# Patient Record
Sex: Female | Born: 1965 | Race: White | Hispanic: No | Marital: Married | Smoking: Never smoker
Health system: Southern US, Community
[De-identification: ages and names within clinical notes are randomized; demographics above are authoritative.]

## PROBLEM LIST (undated history)

## (undated) DIAGNOSIS — H8109 Meniere's disease, unspecified ear: Secondary | ICD-10-CM

## (undated) DIAGNOSIS — E785 Hyperlipidemia, unspecified: Secondary | ICD-10-CM

## (undated) DIAGNOSIS — G43909 Migraine, unspecified, not intractable, without status migrainosus: Secondary | ICD-10-CM

## (undated) DIAGNOSIS — N6019 Diffuse cystic mastopathy of unspecified breast: Secondary | ICD-10-CM

## (undated) DIAGNOSIS — N189 Chronic kidney disease, unspecified: Secondary | ICD-10-CM

## (undated) DIAGNOSIS — K219 Gastro-esophageal reflux disease without esophagitis: Secondary | ICD-10-CM

## (undated) DIAGNOSIS — T7840XA Allergy, unspecified, initial encounter: Secondary | ICD-10-CM

## (undated) DIAGNOSIS — G709 Myoneural disorder, unspecified: Secondary | ICD-10-CM

## (undated) DIAGNOSIS — I10 Essential (primary) hypertension: Secondary | ICD-10-CM

## (undated) DIAGNOSIS — M1990 Unspecified osteoarthritis, unspecified site: Secondary | ICD-10-CM

## (undated) HISTORY — DX: Chronic kidney disease, unspecified: N18.9

## (undated) HISTORY — DX: Diffuse cystic mastopathy of unspecified breast: N60.19

## (undated) HISTORY — DX: Unspecified osteoarthritis, unspecified site: M19.90

## (undated) HISTORY — DX: Meniere's disease, unspecified ear: H81.09

## (undated) HISTORY — PX: SPINE SURGERY: SHX786

## (undated) HISTORY — DX: Hyperlipidemia, unspecified: E78.5

## (undated) HISTORY — DX: Allergy, unspecified, initial encounter: T78.40XA

## (undated) HISTORY — DX: Essential (primary) hypertension: I10

## (undated) HISTORY — PX: TUBAL LIGATION: SHX77

## (undated) HISTORY — DX: Gastro-esophageal reflux disease without esophagitis: K21.9

## (undated) HISTORY — DX: Migraine, unspecified, not intractable, without status migrainosus: G43.909

## (undated) HISTORY — DX: Myoneural disorder, unspecified: G70.9

---
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
## 1997-01-27 HISTORY — PX: APPENDECTOMY: SHX54

## 1999-12-17 ENCOUNTER — Encounter: Payer: Self-pay | Admitting: *Deleted

## 1999-12-17 ENCOUNTER — Ambulatory Visit (HOSPITAL_COMMUNITY): Admission: RE | Admit: 1999-12-17 | Discharge: 1999-12-17 | Payer: Self-pay | Admitting: *Deleted

## 2000-01-14 ENCOUNTER — Encounter (HOSPITAL_COMMUNITY): Admission: RE | Admit: 2000-01-14 | Discharge: 2000-02-28 | Payer: Self-pay | Admitting: *Deleted

## 2000-03-10 ENCOUNTER — Ambulatory Visit (HOSPITAL_COMMUNITY): Admission: RE | Admit: 2000-03-10 | Discharge: 2000-03-10 | Payer: Self-pay | Admitting: *Deleted

## 2000-03-10 ENCOUNTER — Encounter: Payer: Self-pay | Admitting: *Deleted

## 2000-03-17 ENCOUNTER — Encounter (HOSPITAL_COMMUNITY): Admission: RE | Admit: 2000-03-17 | Discharge: 2000-05-27 | Payer: Self-pay | Admitting: *Deleted

## 2000-05-19 ENCOUNTER — Encounter: Payer: Self-pay | Admitting: *Deleted

## 2000-06-02 ENCOUNTER — Encounter (HOSPITAL_COMMUNITY): Admission: RE | Admit: 2000-06-02 | Discharge: 2000-07-02 | Payer: Self-pay | Admitting: *Deleted

## 2000-06-30 ENCOUNTER — Encounter: Payer: Self-pay | Admitting: *Deleted

## 2000-07-04 ENCOUNTER — Inpatient Hospital Stay (HOSPITAL_COMMUNITY): Admission: AD | Admit: 2000-07-04 | Discharge: 2000-07-04 | Payer: Self-pay | Admitting: Obstetrics

## 2000-07-06 ENCOUNTER — Inpatient Hospital Stay (HOSPITAL_COMMUNITY): Admission: AD | Admit: 2000-07-06 | Discharge: 2000-07-10 | Payer: Self-pay | Admitting: Obstetrics & Gynecology

## 2000-07-11 ENCOUNTER — Encounter: Admission: RE | Admit: 2000-07-11 | Discharge: 2000-08-10 | Payer: Self-pay | Admitting: *Deleted

## 2000-07-12 ENCOUNTER — Inpatient Hospital Stay (HOSPITAL_COMMUNITY): Admission: AD | Admit: 2000-07-12 | Discharge: 2000-07-12 | Payer: Self-pay | Admitting: Obstetrics

## 2000-09-10 ENCOUNTER — Encounter: Admission: RE | Admit: 2000-09-10 | Discharge: 2000-10-10 | Payer: Self-pay | Admitting: *Deleted

## 2000-10-11 ENCOUNTER — Encounter: Admission: RE | Admit: 2000-10-11 | Discharge: 2000-11-10 | Payer: Self-pay | Admitting: *Deleted

## 2000-12-11 ENCOUNTER — Encounter: Admission: RE | Admit: 2000-12-11 | Discharge: 2001-01-10 | Payer: Self-pay | Admitting: *Deleted

## 2001-02-10 ENCOUNTER — Encounter: Admission: RE | Admit: 2001-02-10 | Discharge: 2001-03-12 | Payer: Self-pay | Admitting: *Deleted

## 2001-03-13 ENCOUNTER — Encounter: Admission: RE | Admit: 2001-03-13 | Discharge: 2001-04-12 | Payer: Self-pay | Admitting: *Deleted

## 2001-05-11 ENCOUNTER — Encounter: Admission: RE | Admit: 2001-05-11 | Discharge: 2001-06-10 | Payer: Self-pay | Admitting: *Deleted

## 2001-07-11 ENCOUNTER — Encounter: Admission: RE | Admit: 2001-07-11 | Discharge: 2001-08-10 | Payer: Self-pay | Admitting: *Deleted

## 2001-09-10 ENCOUNTER — Encounter: Admission: RE | Admit: 2001-09-10 | Discharge: 2001-10-10 | Payer: Self-pay | Admitting: *Deleted

## 2001-10-11 ENCOUNTER — Encounter: Admission: RE | Admit: 2001-10-11 | Discharge: 2001-11-10 | Payer: Self-pay | Admitting: *Deleted

## 2001-12-11 ENCOUNTER — Encounter: Admission: RE | Admit: 2001-12-11 | Discharge: 2002-01-10 | Payer: Self-pay | Admitting: *Deleted

## 2003-11-17 ENCOUNTER — Ambulatory Visit: Payer: Self-pay | Admitting: Emergency Medicine

## 2003-11-19 ENCOUNTER — Ambulatory Visit: Payer: Self-pay | Admitting: Emergency Medicine

## 2003-11-21 ENCOUNTER — Ambulatory Visit: Payer: Self-pay | Admitting: Emergency Medicine

## 2003-11-22 ENCOUNTER — Ambulatory Visit: Payer: Self-pay | Admitting: Emergency Medicine

## 2004-07-04 ENCOUNTER — Ambulatory Visit: Payer: Self-pay | Admitting: Surgery

## 2004-07-09 ENCOUNTER — Ambulatory Visit: Payer: Self-pay | Admitting: Surgery

## 2004-09-16 ENCOUNTER — Ambulatory Visit: Payer: Self-pay | Admitting: Obstetrics and Gynecology

## 2004-11-12 ENCOUNTER — Ambulatory Visit: Payer: Self-pay | Admitting: Obstetrics and Gynecology

## 2005-04-28 ENCOUNTER — Ambulatory Visit: Payer: Self-pay | Admitting: General Practice

## 2005-11-18 ENCOUNTER — Ambulatory Visit: Payer: Self-pay | Admitting: Obstetrics and Gynecology

## 2006-03-06 ENCOUNTER — Ambulatory Visit: Payer: Self-pay | Admitting: Surgery

## 2006-06-23 ENCOUNTER — Ambulatory Visit: Payer: Self-pay | Admitting: Endocrinology

## 2006-10-24 ENCOUNTER — Emergency Department: Payer: Self-pay | Admitting: Emergency Medicine

## 2007-05-05 ENCOUNTER — Ambulatory Visit: Payer: Self-pay

## 2008-02-22 ENCOUNTER — Ambulatory Visit: Payer: Self-pay

## 2008-02-23 ENCOUNTER — Ambulatory Visit: Payer: Self-pay | Admitting: General Practice

## 2008-08-31 ENCOUNTER — Ambulatory Visit: Payer: Self-pay | Admitting: Obstetrics and Gynecology

## 2008-12-18 ENCOUNTER — Ambulatory Visit: Payer: Self-pay | Admitting: Unknown Physician Specialty

## 2009-01-27 HISTORY — PX: ABDOMINAL HYSTERECTOMY: SHX81

## 2009-01-27 HISTORY — PX: CHOLECYSTECTOMY: SHX55

## 2009-09-11 ENCOUNTER — Other Ambulatory Visit: Payer: Self-pay | Admitting: Obstetrics and Gynecology

## 2009-09-11 ENCOUNTER — Ambulatory Visit: Payer: Self-pay | Admitting: Obstetrics and Gynecology

## 2009-09-12 ENCOUNTER — Ambulatory Visit: Payer: Self-pay | Admitting: Surgery

## 2009-09-13 ENCOUNTER — Ambulatory Visit: Payer: Self-pay | Admitting: Surgery

## 2009-09-13 LAB — CA 125: CA 125: 10.1 U/mL (ref 0.0–34.0)

## 2009-09-17 LAB — PATHOLOGY REPORT

## 2009-09-27 ENCOUNTER — Ambulatory Visit: Payer: Self-pay | Admitting: Obstetrics and Gynecology

## 2010-02-10 ENCOUNTER — Ambulatory Visit: Payer: Self-pay | Admitting: Internal Medicine

## 2010-06-20 ENCOUNTER — Ambulatory Visit: Payer: Self-pay | Admitting: Urology

## 2010-08-23 ENCOUNTER — Other Ambulatory Visit: Payer: Self-pay | Admitting: Obstetrics and Gynecology

## 2010-09-18 ENCOUNTER — Ambulatory Visit: Payer: Self-pay | Admitting: Obstetrics and Gynecology

## 2010-10-25 ENCOUNTER — Ambulatory Visit: Payer: Self-pay | Admitting: Internal Medicine

## 2010-11-12 ENCOUNTER — Other Ambulatory Visit: Payer: Self-pay | Admitting: Physician Assistant

## 2010-11-28 HISTORY — PX: BREAST BIOPSY: SHX20

## 2010-12-13 ENCOUNTER — Ambulatory Visit: Payer: Self-pay | Admitting: Anesthesiology

## 2010-12-16 ENCOUNTER — Ambulatory Visit: Payer: Self-pay | Admitting: Internal Medicine

## 2010-12-23 ENCOUNTER — Ambulatory Visit: Payer: Self-pay | Admitting: Surgery

## 2010-12-25 LAB — PATHOLOGY REPORT

## 2011-01-28 DIAGNOSIS — N6019 Diffuse cystic mastopathy of unspecified breast: Secondary | ICD-10-CM

## 2011-01-28 HISTORY — DX: Diffuse cystic mastopathy of unspecified breast: N60.19

## 2011-04-04 ENCOUNTER — Ambulatory Visit: Payer: Self-pay | Admitting: Internal Medicine

## 2011-11-04 LAB — HM PAP SMEAR: HM Pap smear: NORMAL

## 2011-12-05 LAB — HM MAMMOGRAPHY: HM Mammogram: NORMAL

## 2011-12-22 ENCOUNTER — Ambulatory Visit: Payer: Self-pay | Admitting: Obstetrics and Gynecology

## 2012-02-04 ENCOUNTER — Ambulatory Visit (INDEPENDENT_AMBULATORY_CARE_PROVIDER_SITE_OTHER): Payer: 59 | Admitting: Internal Medicine

## 2012-02-04 ENCOUNTER — Encounter: Payer: Self-pay | Admitting: Internal Medicine

## 2012-02-04 VITALS — BP 126/78 | HR 78 | Temp 98.3°F | Resp 16 | Ht 67.5 in | Wt 239.8 lb

## 2012-02-04 DIAGNOSIS — H8109 Meniere's disease, unspecified ear: Secondary | ICD-10-CM

## 2012-02-04 DIAGNOSIS — E669 Obesity, unspecified: Secondary | ICD-10-CM

## 2012-02-04 MED ORDER — PHENTERMINE HCL 37.5 MG PO TABS: 18.0000 mg | ORAL_TABLET | Freq: Every day | ORAL | Status: DC

## 2012-02-04 NOTE — Progress Notes (Signed)
Patient ID: Amanda Humphrey, female   DOB: [birth year], 47 y.o.   MRN: [id number]  Patient Active Problem List  Diagnosis  . Meniere disease  . Obesity (BMI 30-39.9)    Subjective:  CC:   Chief Complaint  Patient presents with  . Establish Care    HPI:   Amanda Humphrey is a 47 y.o. female who presents as a new patient to establish primary care with the chief complaint of  obesity with expressed desire to lose weight. She has had prior trials of phentermine which was well tolerated  would like to resume pharmacotherapy along with a diet and exercise. She has a history of right bundle branch block but did not have any arrhythmias while taking phentermine in the past. She is planning to join a gym as that is halfway between work and home and has a genuine interest in following the low glycemic index diet. 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 Medical history includes Mnire's disease which is often triggered by sinus infections. She has a history of Bell's palsy involving the left side her face which occurred 3 years ago. She has a history of a supracervical hysterectomy in 2011 secondary to adhesions. She's history of a laparoscopic cholecystectomy due to biliary dyskinesia. Currently she feels fine.    Past Medical History  Diagnosis Date  . Allergy   . Cardiac arrhythmia due to congenital heart disease   . Chronic kidney disease     stones  . Migraines   . Clotting disorder   . Meniere disease     Past Surgical History  Procedure Date  . Cholecystectomy 2011  . Appendectomy 1999  . Tonsillectomy and adenoidectomy 1975  . Cesarean section     4 total  . Breast biopsy Nov 2012    right,  fibrocystic disease, Michela Pitcher  . Abdominal hysterectomy 2011    laprascopic supracervical, DeFrancesco    Family History  Problem Relation Age of Onset  . Hypertension Mother   . Cancer Mother     ovarian  . Arthritis Mother   .  Hyperlipidemia Father   . Hypertension Father   . Diabetes Father   . Alzheimer's disease Maternal Grandmother   . Heart disease Maternal Grandmother   . Alzheimer's disease Paternal Grandmother     History   Social History  . Marital Status: Married    Spouse Name: N/A    Number of Children: N/A  . Years of Education: N/A   Occupational History  . Not on file.   Social History Main Topics  . Smoking status: Never Smoker   . Smokeless tobacco: Not on file  . Alcohol Use: Yes  . Drug Use: No  . Sexually Active:    Other Topics Concern  . Not on file   Social History Narrative  . No narrative on file   Allergies  Allergen Reactions  . Codeine     Rash, Throat swelling,   . Hydrocodone Anaphylaxis  . Tramadol     Itching, facial swelling  . Oxycodone Rash    Review of Systems:   The remainder of the review of systems was negative except those addressed in the HPI.       Objective:  BP 126/78  Pulse 78  Temp 98.3 F (36.8 C) (Oral)  Resp 16  Ht 5' 7.5" (1.715 m)  Wt 239 lb 12 oz (108.75 kg)  BMI 37.00 kg/m2  SpO2  97%  General appearance: alert, cooperative and appears stated age Ears: normal TM's and external ear canals both ears Throat: lips, mucosa, and tongue normal; teeth and gums normal Neck: no adenopathy, no carotid bruit, supple, symmetrical, trachea midline and thyroid not enlarged, symmetric, no tenderness/mass/nodules Back: symmetric, no curvature. ROM normal. No CVA tenderness. Lungs: clear to auscultation bilaterally Heart: regular rate and rhythm, S1, S2 normal, no murmur, click, rub or gallop Abdomen: soft, non-tender; bowel sounds normal; no masses,  no organomegaly Pulses: 2+ and symmetric Skin: Skin color, texture, turgor normal. No rashes or lesions Lymph nodes: Cervical, supraclavicular, and axillary nodes normal.  Assessment and Plan:  Obesity (BMI 30-39.9) Patient is very motivated to lose weight. She is requesting  pharmacotherapy and understands that if her weight plateaus the medication will be suspended. She has tolerated phentermine in the past advised resuming this rather than trying a new medication which I am not familiar with. She has been screened for diabetes hypothyroidism. Printed handout for low glycemic index diet given to patient. She has a gym membership planned and advised that she will need to make her goal 30 minutes of exercise 5 days a week.  Meniere disease Currently controlled on Dyazide. Episodes of vertigo are triggered by sinusitis. Recommended daily sinus hygiene during the winter season with sterile saline flushes.    Updated Medication List Outpatient Encounter Prescriptions as of 02/04/2012  Medication Sig Dispense Refill  . triamterene-hydrochlorothiazide (DYAZIDE) 37.5-25 MG per capsule Take 1 capsule by mouth every morning.      . phentermine (ADIPEX-P) 37.5 MG tablet Take 0.5 tablets (18.75 mg total) by mouth daily before breakfast.  30 tablet  2     Orders Placed This Encounter  Procedures  . HM MAMMOGRAPHY  . HM PAP SMEAR    No Follow-up on file.

## 2012-02-04 NOTE — Patient Instructions (Addendum)
This is  my version of a  "Low GI"  Diet:  All of the foods can be found at grocery stores and in bulk at [employer].  The Atkins protein bars and shakes are available in more varieties at [employer], [employer] and [employer].     7 AM Breakfast:  Low carbohydrate Protein  Shakes (I recommend the EAS AdvantEdge "Carb Control" shakes  Or the low carb shakes by Atkins.   Both are available everywhere:  In  cases at [employer]Js  Or in 4 packs at grocery stores and pharmacies  2.5 carbs  (Alternative is  a toasted Arnold's Sandwhich Thin w/ peanut butter, a "Bagel Thin" with cream cheese and salmon) or  a scrambled egg burrito made with a low carb tortilla .  Avoid cereal and bananas, oatmeal too unless you are cooking the old fashioned kind that takes 30-40 minutes to prepare.  the rest is overly processed, has minimal fiber, and is loaded with carbohydrates!   10 AM: Protein bar by Atkins (the snack size, under 200 cal).  There are many varieties , available widely again or in bulk in limited varieties at [employer]Js)  Other so called "protein bars" tend to be loaded with carbohydrates.  Remember, in food advertising, the word "energy" is synonymous for " carbohydrate."  Lunch: sandwich of [place], (or any lunchmeat, grilled meat or canned tuna), fresh avocado, mayonnaise  and cheese on a lower carbohydrate pita bread, flatbread, or tortilla . Ok to use regular mayonnaise. The bread is the only source or carbohydrate that can be decreased (Joseph's makes a pita bread and a flat bread that are 50 cal and 4 net carbs ; Toufayan makes a low carb flatbread that's 100 cal and 9 net carbs  and  Mission makes a low carb whole wheat tortilla  That is 210 cal and 6 net carbs)  3 PM:  Mid day :  Another protein bar,  Or a  cheese stick (100 cal, 0 carbs),  Or 1 ounce of  almonds, walnuts, pistachios, pecans, peanuts,  Macadamia nuts. Or a Dannon light n Fit greek yogurt, 80 cal 8 net carbs . Avoid "granola"; the dried cranberries and  raisins are loaded with carbohydrates. Mixed nuts ok if no raisins or cranberries or dried fruit.      6 PM  Dinner:  "mean and green:"  Meat/chicken/fish or a high protein legume; , with a green salad, and a low GI  Veggie (broccoli, cauliflower, green beans, spinach, brussel sprouts. Lima beans) : Avoid "Low fat dressings, as well as Reyne Dumas and [address]! They are loaded with sugar! Instead use ranch, vinaigrette,  Blue cheese, etc.  There is a low carb pasta by Dreamfield's available at [facility] that is acceptable and tastes great. Try Michel Angelo's chicken piccata over low carb pasta. The chicken dish is 0 carbs, and can be found in frozen section at BJs and [employer]. Also try [employer] "Carnitas" (pulled pork, no sauce,  0 carbs) and his pot roast.   both are in the refrigerated section at BJs   9 PM snack : Breyer's "low carb" fudgsicle or  ice cream bar (Carb Smart line), or  Weight Watcher's ice cream bar , or another "no sugar added" ice cream;a serving of fresh berries/cherries with whipped cream (Avoid bananas, pineapple, grapes  and watermelon on a regular basis because they are high in sugar)   Remember that snack Substitutions should be less than 15 to  20 carbs  Per serving. Remember to subtract fiber grams and sugar alcohols to get the "net carbs."

## 2012-02-05 ENCOUNTER — Encounter: Payer: Self-pay | Admitting: Internal Medicine

## 2012-02-05 DIAGNOSIS — E669 Obesity, unspecified: Secondary | ICD-10-CM | POA: Insufficient documentation

## 2012-02-05 NOTE — Assessment & Plan Note (Addendum)
Patient is very motivated to lose weight. She is requesting pharmacotherapy and understands that if her weight plateaus the medication will be suspended. She has tolerated phentermine in the past advised resuming this rather than trying a new medication which I am not familiar with. She has been screened for diabetes hypothyroidism. Printed handout for low glycemic index diet given to patient. She has a gym membership planned and advised that she will need to make her goal 30 minutes of exercise 5 days a week.

## 2012-02-05 NOTE — Assessment & Plan Note (Signed)
Currently controlled on Dyazide. Episodes of vertigo are triggered by sinusitis. Recommended daily sinus hygiene during the winter season with sterile saline flushes.

## 2012-03-09 ENCOUNTER — Ambulatory Visit: Payer: Self-pay

## 2012-03-25 ENCOUNTER — Encounter: Payer: Self-pay | Admitting: General Practice

## 2012-03-27 ENCOUNTER — Encounter: Payer: Self-pay | Admitting: General Practice

## 2012-03-31 ENCOUNTER — Other Ambulatory Visit: Payer: Self-pay | Admitting: Internal Medicine

## 2012-03-31 MED ORDER — PHENTERMINE HCL 37.5 MG PO TABS: 18.0000 mg | ORAL_TABLET | Freq: Every day | ORAL | Status: DC

## 2012-03-31 NOTE — Progress Notes (Signed)
Pt notified Phentermine available at the front desk for pick up.

## 2012-04-02 ENCOUNTER — Ambulatory Visit: Payer: Self-pay | Admitting: General Practice

## 2012-06-30 ENCOUNTER — Other Ambulatory Visit: Payer: Self-pay | Admitting: Internal Medicine

## 2012-06-30 MED ORDER — PHENTERMINE HCL 37.5 MG PO TABS: 18.0000 mg | ORAL_TABLET | Freq: Every day | ORAL | Status: DC

## 2012-09-03 ENCOUNTER — Encounter: Payer: Self-pay | Admitting: Internal Medicine

## 2012-09-03 ENCOUNTER — Ambulatory Visit (INDEPENDENT_AMBULATORY_CARE_PROVIDER_SITE_OTHER): Payer: 59 | Admitting: Internal Medicine

## 2012-09-03 VITALS — BP 128/82 | HR 65 | Temp 98.6°F | Resp 14 | Wt 226.8 lb

## 2012-09-03 DIAGNOSIS — E669 Obesity, unspecified: Secondary | ICD-10-CM

## 2012-09-03 DIAGNOSIS — N6019 Diffuse cystic mastopathy of unspecified breast: Secondary | ICD-10-CM

## 2012-09-03 DIAGNOSIS — N6011 Diffuse cystic mastopathy of right breast: Secondary | ICD-10-CM

## 2012-09-03 MED ORDER — PHENTERMINE HCL 37.5 MG PO TABS: 37.5000 mg | ORAL_TABLET | Freq: Every day | ORAL | Status: DC

## 2012-09-03 NOTE — Progress Notes (Signed)
Patient ID: Amanda Humphrey, female   DOB: 24-Dec-1965, 47 y.o.   MRN: [id number]   Patient Active Problem List   Diagnosis Date Noted  . Fibrocystic breast disease   . Obesity (BMI 30-39.9) 02/05/2012  . Meniere disease     Subjective:  CC:   Chief Complaint  Patient presents with  . Follow-up    weight check     HPI:   Amanda Humphrey a 47 y.o. female who presents  Three-month followup on obesity with medication prescribed for appetite suppression. She Has lost 19 lbs by measurements on same scale.   She is Tolerating phentermine well.   She is taking it once daily full-strength. She has not been exercising recently due to deteriorating health of her parents live nearby. He has struggled with her weight her whole life and craves salt and carbohydrates. Increasing familial obligations and increased work stressors are cited as obstacles to developing consistency with  regular exercise.    2) she has noted a new lump on her right breast directly in the area of her  lumpectomy scar,  is actually inferior to it. It is nontender. Her lumpectomy was done in 2013 by Dr. Michela Humphrey  Past Medical History  Diagnosis Date  . Allergy   . Cardiac arrhythmia due to congenital heart disease   . Chronic kidney disease     stones  . Migraines   . Clotting disorder   . Meniere disease   . Fibrocystic breast disease 2013    Past Surgical History  Procedure Laterality Date  . Cholecystectomy  2011  . Appendectomy  1999  . Tonsillectomy and adenoidectomy  1975  . Cesarean section      4 total  . Breast biopsy  Nov 2012    right,  fibrocystic disease, Amanda Humphrey  . Abdominal hysterectomy  2011    laprascopic supracervical, Amanda Humphrey       The following portions of the patient's history were reviewed and updated as appropriate: Allergies, current medications, and problem list.    Review of Systems:   12 Pt  review of systems was negative except those addressed in the HPI,     History    Social History  . Marital Status: Married    Spouse Name: N/A    Number of Children: N/A  . Years of Education: N/A   Occupational History  . Not on file.   Social History Main Topics  . Smoking status: Never Smoker   . Smokeless tobacco: Not on file  . Alcohol Use: Yes  . Drug Use: No  . Sexually Active: Not on file   Other Topics Concern  . Not on file   Social History Narrative  . No narrative on file    Objective:  Filed Vitals:   09/03/12 0910  BP: 128/82  Pulse: 65  Temp: 98.6 F (37 C)  Resp: 14     .BP 128/82  Pulse 65  Temp(Src) 98.6 F (37 C) (Oral)  Resp 14  Wt 226 lb 12 oz (102.853 kg)  BMI 34.97 kg/m2  SpO2 99%  General Appearance:    Alert, cooperative, no distress, appears stated age  Head:    Normocephalic, without obvious abnormality, atraumatic  Eyes:    PERRL, conjunctiva/corneas clear, EOM's intact, fundi    benign, both eyes  Ears:    Normal TM's and external ear canals, both ears  Nose:   Nares normal, septum midline, mucosa normal, no drainage    or  sinus tenderness  Throat:   Lips, mucosa, and tongue normal; teeth and gums normal  Neck:   Supple, symmetrical, trachea midline, no adenopathy;    thyroid:  no enlargement/tenderness/nodules; no carotid   bruit or JVD  Back:     Symmetric, no curvature, ROM normal, no CVA tenderness  Lungs:     Clear to auscultation bilaterally, respirations unlabored  Chest Wall:    No tenderness or deformity   Heart:    Regular rate and rhythm, S1 and S2 normal, no murmur, rub   or gallop  Breast Exam:     Pea sized nodule at the 3:00 position inferior to lumpectomy scar   Abdomen:     Soft, non-tender, bowel sounds active all four quadrants,    no masses, no organomegaly  Extremities:   Extremities normal, atraumatic, no cyanosis or edema  Pulses:   2+ and symmetric all extremities  Skin:   Skin color, texture, turgor normal, no rashes or lesions  Lymph nodes:   Cervical, supraclavicular, and  axillary nodes normal  Neurologic:   CNII-XII intact, normal strength, sensation and reflexes    throughout   Assessment and Plan:  Obesity (BMI 30-39.9) Improving with use of appetite suppression medication, phentermine. Encourage patient to begin regular exercise as part time for continuing phentermine is becoming limited. Will refill for another 3 months.  Fibrocystic breast disease She had a benign breast biopsy in 2012 and has now developed a lump  In the same breast inferior to her  lumpectomy scar. Diagnostic mammogram has been ordered.   Updated Medication List Outpatient Encounter Prescriptions as of 09/03/2012  Medication Sig Dispense Refill  . phentermine (ADIPEX-P) 37.5 MG tablet Take 1 tablet (37.5 mg total) by mouth daily before breakfast.  30 tablet  2  . triamterene-hydrochlorothiazide (DYAZIDE) 37.5-25 MG per capsule Take 1 capsule by mouth every morning.      . [DISCONTINUED] phentermine (ADIPEX-P) 37.5 MG tablet Take 0.5 tablets (18.75 mg total) by mouth daily before breakfast.  30 tablet  0   No facility-administered encounter medications on file as of 09/03/2012.     No orders of the defined types were placed in this encounter.    No Follow-up on file.

## 2012-09-04 ENCOUNTER — Encounter: Payer: Self-pay | Admitting: Internal Medicine

## 2012-09-04 DIAGNOSIS — N6019 Diffuse cystic mastopathy of unspecified breast: Secondary | ICD-10-CM | POA: Insufficient documentation

## 2012-09-04 NOTE — Assessment & Plan Note (Signed)
Improving with use of appetite suppression medication, phentermine. Encourage patient to begin regular exercise as part time for continuing phentermine is becoming limited. Will refill for another 3 months.

## 2012-09-04 NOTE — Assessment & Plan Note (Signed)
She had a benign breast biopsy in 2012 and has now developed a lump  In the same breast inferior to her  lumpectomy scar. Diagnostic mammogram has been ordered.

## 2012-10-19 ENCOUNTER — Ambulatory Visit: Payer: Self-pay

## 2012-10-25 ENCOUNTER — Ambulatory Visit (INDEPENDENT_AMBULATORY_CARE_PROVIDER_SITE_OTHER): Payer: 59 | Admitting: Internal Medicine

## 2012-10-25 ENCOUNTER — Encounter: Payer: Self-pay | Admitting: Internal Medicine

## 2012-10-25 VITALS — BP 136/82 | HR 78 | Temp 98.9°F | Resp 12 | Wt 232.0 lb

## 2012-10-25 DIAGNOSIS — T63431A Toxic effect of venom of caterpillars, accidental (unintentional), initial encounter: Secondary | ICD-10-CM | POA: Insufficient documentation

## 2012-10-25 DIAGNOSIS — T6591XS Toxic effect of unspecified substance, accidental (unintentional), sequela: Secondary | ICD-10-CM

## 2012-10-25 DIAGNOSIS — W57XXXA Bitten or stung by nonvenomous insect and other nonvenomous arthropods, initial encounter: Secondary | ICD-10-CM

## 2012-10-25 DIAGNOSIS — T148 Other injury of unspecified body region: Secondary | ICD-10-CM

## 2012-10-25 DIAGNOSIS — T63431S Toxic effect of venom of caterpillars, accidental (unintentional), sequela: Secondary | ICD-10-CM

## 2012-10-25 LAB — CBC WITH DIFFERENTIAL/PLATELET
Basophils Absolute: 0 10*3/uL (ref 0.0–0.1)
Basophils Relative: 0 % (ref 0.0–3.0)
Eosinophils Absolute: 0 10*3/uL (ref 0.0–0.7)
Eosinophils Relative: 0 % (ref 0.0–5.0)
HCT: 39.5 % (ref 36.0–46.0)
Hemoglobin: 13.4 g/dL (ref 12.0–15.0)
Lymphocytes Relative: 9.2 % — ABNORMAL LOW (ref 12.0–46.0)
Lymphs Abs: 1 10*3/uL (ref 0.7–4.0)
MCHC: 33.8 g/dL (ref 30.0–36.0)
MCV: 88.4 fl (ref 78.0–100.0)
Monocytes Absolute: 0.2 10*3/uL (ref 0.1–1.0)
Monocytes Relative: 1.6 % — ABNORMAL LOW (ref 3.0–12.0)
Neutro Abs: 9.3 10*3/uL — ABNORMAL HIGH (ref 1.4–7.7)
Neutrophils Relative %: 89.2 % — ABNORMAL HIGH (ref 43.0–77.0)
Platelets: 280 10*3/uL (ref 150.0–400.0)
RBC: 4.47 Mil/uL (ref 3.87–5.11)
RDW: 13.8 % (ref 11.5–14.6)
WBC: 10.4 10*3/uL (ref 4.5–10.5)

## 2012-10-25 MED ORDER — LEVOFLOXACIN 500 MG PO TABS: 500.0000 mg | ORAL_TABLET | Freq: Every day | ORAL | Status: DC

## 2012-10-25 MED ORDER — PREDNISONE 10 MG PO TABS: ORAL_TABLET | ORAL | Status: DC

## 2012-10-25 MED ORDER — FAMOTIDINE 20 MG PO TABS: 20.0000 mg | ORAL_TABLET | Freq: Two times a day (BID) | ORAL | Status: DC

## 2012-10-25 MED ORDER — PREDNISONE 10 MG PO TABS: 30.0000 mg | ORAL_TABLET | Freq: Every day | ORAL | Status: DC

## 2012-10-25 NOTE — Progress Notes (Signed)
Patient ID: Amanda Humphrey, female   DOB: November 06, 1965, 47 y.o.   MRN: [id number]  Patient Active Problem List   Diagnosis Date Noted  . Toxic effect of venom of caterpillars, unintentional 10/25/2012  . Fibrocystic breast disease   . Obesity (BMI 30-39.9) 02/05/2012  . Meniere disease     Subjective:  CC:   No chief complaint on file.   HPI:   Amanda Humphrey a 47 y.o. female who presents with sequelaue of toxic caterpillar bite. The Caterpillar bite,  "puss" or flannel moth  Bite (confirmed by urgent Care as a venomous arthropod) [place] the beach.  Brushed her hand against one and felt immediate burning  irritation ,  Iced it immediately but her entire hand and forearm started swelling and turning burgundy immediately.  By the time she got to urgent Care th entire arm was llocke dup.  Received solumedrol., benadryl.   Given 60 mg prednisone taper  which she started Friday night, and toradol for pain .  Feels achey,  Face getting flushed ., feels febrile, goes from hot to cold. Some petechiae noted on fingers where she had contact.  Some nause,a  Has zofran at home,  Not using toradol because the arm pain has resolved.     Past Medical History  Diagnosis Date  . Allergy   . Cardiac arrhythmia due to congenital heart disease   . Chronic kidney disease     stones  . Migraines   . Clotting disorder   . Meniere disease   . Fibrocystic breast disease 2013    Past Surgical History  Procedure Laterality Date  . Cholecystectomy  2011  . Appendectomy  1999  . Tonsillectomy and adenoidectomy  1975  . Cesarean section      4 total  . Breast biopsy  Nov 2012    right,  fibrocystic disease, Michela Pitcher  . Abdominal hysterectomy  2011    laprascopic supracervical, DeFrancesco       The following portions of the patient's history were reviewed and updated as appropriate: Allergies, current medications, and problem list.    Review of Systems:   12 Pt  review of systems was  negative except those addressed in the HPI,     History   Social History  . Marital Status: Married    Spouse Name: N/A    Number of Children: N/A  . Years of Education: N/A   Occupational History  . Not on file.   Social History Main Topics  . Smoking status: Never Smoker   . Smokeless tobacco: Not on file  . Alcohol Use: Yes  . Drug Use: No  . Sexual Activity: Not on file   Other Topics Concern  . Not on file   Social History Narrative  . No narrative on file    Objective:  Filed Vitals:   10/25/12 1325  BP: 136/82  Pulse: 78  Temp: 98.9 F (37.2 C)  Resp: 12     General appearance: alert, cooperative and appears stated age Ears: normal TM's and external ear canals both ears Throat: lips, mucosa, and tongue normal; teeth and gums normal Neck: no adenopathy, no carotid bruit, supple, symmetrical, trachea midline and thyroid not enlarged, symmetric, no tenderness/mass/nodules Back: symmetric, no curvature. ROM normal. No CVA tenderness. Lungs: clear to auscultation bilaterally Heart: regular rate and rhythm, S1, S2 normal, no murmur, click, rub or gallop Abdomen: soft, non-tender; bowel sounds normal; no masses,  no organomegaly Pulses: 2+ and  symmetric Skin: Skin color, texture, turgor normal. No rashes or lesions Lymph nodes: Cervical, supraclavicular, and axillary nodes normal.  Assessment and Plan:  Toxic effect of venom of caterpillars, unintentional She is on day 4 of prednisone taper.  Will continue 30 mg daily of prednisone for up to one week, then resume taper. Adding famotidine 40 mg bid and zyrtec for antihistamine.  No muscle spasms or neurotoxic symptoms currently.  Checking CBC to rule out thrombocytopenia given petechiae noticed.    Updated Medication List Outpatient Encounter Prescriptions as of 10/25/2012  Medication Sig Dispense Refill  . ketorolac (TORADOL) 10 MG tablet Take 10 mg by mouth every 6 (six) hours as needed for pain.      .  phentermine (ADIPEX-P) 37.5 MG tablet Take 1 tablet (37.5 mg total) by mouth daily before breakfast.  30 tablet  2  . predniSONE (DELTASONE) 10 MG tablet Take 3 tablets (30 mg total) by mouth daily.  21 tablet  0  . triamterene-hydrochlorothiazide (DYAZIDE) 37.5-25 MG per capsule Take 1 capsule by mouth every morning.      . [DISCONTINUED] predniSONE (DELTASONE) 10 MG tablet Take 30 mg by mouth daily. Taper done 10 mg every day      . famotidine (PEPCID) 20 MG tablet Take 1 tablet (20 mg total) by mouth 2 (two) times daily.  14 tablet  0  . levofloxacin (LEVAQUIN) 500 MG tablet Take 1 tablet (500 mg total) by mouth daily.  7 tablet  0  . predniSONE (DELTASONE) 10 MG tablet 3 tablets daily for one week ,then taper using prior prescription  7 tablet  0   No facility-administered encounter medications on file as of 10/25/2012.     Orders Placed This Encounter  Procedures  . CBC with Differential    No Follow-up on file.

## 2012-10-25 NOTE — Patient Instructions (Addendum)
Please take 30 mg prednisone daily for the next few days until you are feeling better  (up to 7 days), then resume the taper  Adding famotidine 40 mg tiwce daily (anithistamine effedt)  Zyrtec daily instead of bendryl  Levaquin for your trip

## 2012-10-25 NOTE — Assessment & Plan Note (Signed)
She is on day 4 of prednisone taper.  Will continue 30 mg daily of prednisone for up to one week, then resume taper. Adding famotidine 40 mg bid and zyrtec for antihistamine.  No muscle spasms or neurotoxic symptoms currently.  Checking CBC to rule out thrombocytopenia given petechiae noticed.

## 2012-10-27 ENCOUNTER — Encounter: Payer: Self-pay | Admitting: *Deleted

## 2012-12-02 LAB — HM PAP SMEAR: HM Pap smear: NORMAL

## 2012-12-02 LAB — HM MAMMOGRAPHY: HM Mammogram: NORMAL

## 2012-12-06 ENCOUNTER — Ambulatory Visit: Payer: Self-pay | Admitting: Orthopedic Surgery

## 2012-12-08 ENCOUNTER — Other Ambulatory Visit: Payer: Self-pay | Admitting: Internal Medicine

## 2012-12-08 MED ORDER — PHENTERMINE HCL 37.5 MG PO TABS: 37.5000 mg | ORAL_TABLET | Freq: Every day | ORAL | Status: DC

## 2013-02-25 ENCOUNTER — Encounter: Payer: Self-pay | Admitting: Internal Medicine

## 2013-02-25 ENCOUNTER — Other Ambulatory Visit (INDEPENDENT_AMBULATORY_CARE_PROVIDER_SITE_OTHER): Payer: 59

## 2013-02-25 ENCOUNTER — Ambulatory Visit (INDEPENDENT_AMBULATORY_CARE_PROVIDER_SITE_OTHER): Payer: 59 | Admitting: Internal Medicine

## 2013-02-25 VITALS — BP 120/80 | HR 65 | Temp 98.5°F | Resp 20 | Ht 67.5 in | Wt 234.1 lb

## 2013-02-25 DIAGNOSIS — R5383 Other fatigue: Secondary | ICD-10-CM

## 2013-02-25 DIAGNOSIS — E669 Obesity, unspecified: Secondary | ICD-10-CM

## 2013-02-25 DIAGNOSIS — R5381 Other malaise: Secondary | ICD-10-CM

## 2013-02-25 DIAGNOSIS — E039 Hypothyroidism, unspecified: Secondary | ICD-10-CM

## 2013-02-25 DIAGNOSIS — E038 Other specified hypothyroidism: Secondary | ICD-10-CM

## 2013-02-25 DIAGNOSIS — H8109 Meniere's disease, unspecified ear: Secondary | ICD-10-CM

## 2013-02-25 DIAGNOSIS — E559 Vitamin D deficiency, unspecified: Secondary | ICD-10-CM

## 2013-02-25 LAB — CBC WITH DIFFERENTIAL/PLATELET
Basophils Absolute: 0 10*3/uL (ref 0.0–0.1)
Basophils Relative: 0.6 % (ref 0.0–3.0)
Eosinophils Absolute: 0.1 10*3/uL (ref 0.0–0.7)
Eosinophils Relative: 1.6 % (ref 0.0–5.0)
HCT: 44 % (ref 36.0–46.0)
Hemoglobin: 14.7 g/dL (ref 12.0–15.0)
Lymphocytes Relative: 34.9 % (ref 12.0–46.0)
Lymphs Abs: 2 10*3/uL (ref 0.7–4.0)
MCHC: 33.5 g/dL (ref 30.0–36.0)
MCV: 89.1 fl (ref 78.0–100.0)
Monocytes Absolute: 0.3 10*3/uL (ref 0.1–1.0)
Monocytes Relative: 4.5 % (ref 3.0–12.0)
Neutro Abs: 3.3 10*3/uL (ref 1.4–7.7)
Neutrophils Relative %: 58.4 % (ref 43.0–77.0)
Platelets: 269 10*3/uL (ref 150.0–400.0)
RBC: 4.93 Mil/uL (ref 3.87–5.11)
RDW: 13.5 % (ref 11.5–14.6)
WBC: 5.7 10*3/uL (ref 4.5–10.5)

## 2013-02-25 LAB — LIPID PANEL
Cholesterol: 202 mg/dL — ABNORMAL HIGH (ref 0–200)
HDL: 51.9 mg/dL (ref >39.00)
Total CHOL/HDL Ratio: 4
Triglycerides: 124 mg/dL (ref 0.0–149.0)
VLDL: 24.8 mg/dL (ref 0.0–40.0)

## 2013-02-25 LAB — LDL CHOLESTEROL, DIRECT: Direct LDL: 137.4 mg/dL

## 2013-02-25 MED ORDER — LORCASERIN HCL 10 MG PO TABS: 1.0000 | ORAL_TABLET | Freq: Two times a day (BID) | ORAL | Status: DC

## 2013-02-25 NOTE — Assessment & Plan Note (Addendum)
She is requesting a prescription for a 15 day trial of belviq .  We discussed the risks and benefits of this medication and the fact that if she does not lose 10% of her body weight over the next 3 months and the medication should be stopped. Given her current weight her goal is to lose 25 pounds. She has not had fasting lipids thyroid or screened for diabetes since last year. She will return for baseline blood work.

## 2013-02-25 NOTE — Patient Instructions (Signed)
Trial of Belviq for 15 days,  Twice daily dosing is the maximal daily dose  E mail me your weight in 2 weeks

## 2013-02-25 NOTE — Progress Notes (Signed)
Patient ID: Amanda Humphrey, female   DOB: August 04, 1965, 48 y.o.   MRN: [id number]  Patient Active Problem List   Diagnosis Date Noted  . Toxic effect of venom of caterpillars, unintentional 10/25/2012  . Fibrocystic breast disease   . Obesity (BMI 30-39.9) 02/05/2012  . Meniere disease     Subjective:  CC:   Chief Complaint  Patient presents with  . Follow-up    HPI:   Amanda Humphrey a 48 y.o. female who presents for follow up on obesity and Meniere's disease.  She is having trouble losing weight without pharmacologic assistance. She had limited success with prior trial of phentermine. She lost 19 pounds over 6 months but had elevations in blood pressure and heart rate and would like to try Belviq. She did some research and found an online coupon for 2 free weeks and is requesting a prescription. We reviewed her diet today and her previous obstacles to participating in regular exercise.   Past Medical History  Diagnosis Date  . Allergy   . Cardiac arrhythmia due to congenital heart disease   . Chronic kidney disease     stones  . Migraines   . Clotting disorder   . Meniere disease   . Fibrocystic breast disease 2013    Past Surgical History  Procedure Laterality Date  . Cholecystectomy  2011  . Appendectomy  1999  . Tonsillectomy and adenoidectomy  1975  . Cesarean section      4 total  . Breast biopsy  Nov 2012    right,  fibrocystic disease, Michela Pitcherly  . Abdominal hysterectomy  2011    laprascopic supracervical, [place]       The following portions of the patient's history were reviewed and updated as appropriate: Allergies, current medications, and problem list.    Review of Systems:   12 Pt  review of systems was negative except those addressed in the HPI,     History   Social History  . Marital Status: Married    Spouse Name: N/A    Number of Children: N/A  . Years of Education: N/A   Occupational History  . Not on file.   Social History Main  Topics  . Smoking status: Never Smoker   . Smokeless tobacco: Not on file  . Alcohol Use: Yes  . Drug Use: No  . Sexual Activity: Not on file   Other Topics Concern  . Not on file   Social History Narrative  . No narrative on file    Objective:  Filed Vitals:   02/25/13 0805  BP: 120/80  Pulse: 65  Temp: 98.5 F (36.9 C)  Resp: 20     General appearance: alert, cooperative and appears stated age Ears: normal TM's and external ear canals both ears Throat: lips, mucosa, and tongue normal; teeth and gums normal Neck: no adenopathy, no carotid bruit, supple, symmetrical, trachea midline and thyroid not enlarged, symmetric, no tenderness/mass/nodules Back: symmetric, no curvature. ROM normal. No CVA tenderness. Lungs: clear to auscultation bilaterally Heart: regular rate and rhythm, S1, S2 normal, no murmur, click, rub or gallop Abdomen: soft, non-tender; bowel sounds normal; no masses,  no organomegaly Pulses: 2+ and symmetric Skin: Skin color, texture, turgor normal. No rashes or lesions Lymph nodes: Cervical, supraclavicular, and axillary nodes normal.  Assessment and Plan:  Obesity (BMI 30-39.9) She is requesting a prescription for a 15 day trial of belviq .  We discussed the risks and benefits of this medication and the fact  that if she does not lose 10% of her body weight over the next 3 months and the medication should be stopped. Given her current weight her goal is to lose 25 pounds. She has not had fasting lipids thyroid or screened for diabetes since last year. She will return for baseline blood work.  Meniere disease  Managed with triamterene HCTZ. She has had no recent episodes.  she will return for fasting blood work which will include screening for lipoid deficiencies.Marland Kitchen   Updated Medication List Outpatient Encounter Prescriptions as of 02/25/2013  Medication Sig  . triamterene-hydrochlorothiazide (DYAZIDE) 37.5-25 MG per capsule Take 1 capsule by mouth every  morning.  . Lorcaserin HCl (BELVIQ) 10 MG TABS Take 1 tablet by mouth 2 (two) times daily.  . Lorcaserin HCl (BELVIQ) 10 MG TABS Take 1 tablet by mouth 2 (two) times daily.  . [DISCONTINUED] famotidine (PEPCID) 20 MG tablet Take 1 tablet (20 mg total) by mouth 2 (two) times daily.  . [DISCONTINUED] ketorolac (TORADOL) 10 MG tablet Take 10 mg by mouth every 6 (six) hours as needed for pain.  . [DISCONTINUED] levofloxacin (LEVAQUIN) 500 MG tablet Take 1 tablet (500 mg total) by mouth daily.  . [DISCONTINUED] Lorcaserin HCl (BELVIQ) 10 MG TABS Take 1 tablet by mouth 2 (two) times daily.  . [DISCONTINUED] phentermine (ADIPEX-P) 37.5 MG tablet Take 1 tablet (37.5 mg total) by mouth daily before breakfast.  . [DISCONTINUED] predniSONE (DELTASONE) 10 MG tablet 3 tablets daily for one week ,then taper using prior prescription  . [DISCONTINUED] predniSONE (DELTASONE) 10 MG tablet Take 3 tablets (30 mg total) by mouth daily.     Orders Placed This Encounter  Procedures  . CBC with Differential  . Comprehensive metabolic panel  . Lipid panel  . Vit D  25 hydroxy (rtn osteoporosis monitoring)  . TSH + free T4    No Follow-up on file.

## 2013-02-25 NOTE — Progress Notes (Signed)
Pre-visit discussion using our clinic review tool. No additional management support is needed unless otherwise documented below in the visit note.  

## 2013-02-27 ENCOUNTER — Encounter: Payer: Self-pay | Admitting: Internal Medicine

## 2013-02-27 NOTE — Assessment & Plan Note (Signed)
Managed with triamterene HCTZ. She has had no recent episodes.  she will return for fasting blood work which will include screening for lipoid deficiencies..Marland Kitchen

## 2013-03-09 ENCOUNTER — Telehealth: Payer: Self-pay | Admitting: Internal Medicine

## 2013-03-09 NOTE — Telephone Encounter (Signed)
Patient stated what was her TSH ? I do not see it resulted please advise

## 2013-03-09 NOTE — Telephone Encounter (Signed)
Her labs from last week were normal including cholesterol, total was 202,   LDL 137 and  HDL 51  All good she may want a copy faxed or mailed to her.

## 2013-03-10 ENCOUNTER — Other Ambulatory Visit: Payer: Self-pay | Admitting: Internal Medicine

## 2013-03-10 DIAGNOSIS — Z91041 Radiographic dye allergy status: Secondary | ICD-10-CM | POA: Insufficient documentation

## 2013-03-10 MED ORDER — PREDNISONE 50 MG PO TABS: ORAL_TABLET | ORAL | Status: DC

## 2013-03-14 ENCOUNTER — Ambulatory Visit: Payer: Self-pay | Admitting: Orthopedic Surgery

## 2013-09-02 ENCOUNTER — Ambulatory Visit (INDEPENDENT_AMBULATORY_CARE_PROVIDER_SITE_OTHER): Payer: 59 | Admitting: Cardiovascular Disease

## 2013-09-02 ENCOUNTER — Encounter: Payer: Self-pay | Admitting: Cardiovascular Disease

## 2013-09-02 VITALS — BP 110/86 | HR 67 | Ht 69.0 in | Wt 250.2 lb

## 2013-09-02 DIAGNOSIS — I498 Other specified cardiac arrhythmias: Secondary | ICD-10-CM

## 2013-09-02 DIAGNOSIS — I471 Supraventricular tachycardia, unspecified: Secondary | ICD-10-CM

## 2013-09-02 DIAGNOSIS — Z8679 Personal history of other diseases of the circulatory system: Secondary | ICD-10-CM

## 2013-09-02 DIAGNOSIS — R609 Edema, unspecified: Secondary | ICD-10-CM

## 2013-09-02 DIAGNOSIS — R5383 Other fatigue: Secondary | ICD-10-CM

## 2013-09-02 DIAGNOSIS — R5381 Other malaise: Secondary | ICD-10-CM

## 2013-09-02 DIAGNOSIS — I4510 Unspecified right bundle-branch block: Secondary | ICD-10-CM

## 2013-09-02 DIAGNOSIS — R002 Palpitations: Secondary | ICD-10-CM

## 2013-09-02 DIAGNOSIS — I10 Essential (primary) hypertension: Secondary | ICD-10-CM

## 2013-09-02 DIAGNOSIS — E669 Obesity, unspecified: Secondary | ICD-10-CM

## 2013-09-02 DIAGNOSIS — Z01810 Encounter for preprocedural cardiovascular examination: Secondary | ICD-10-CM

## 2013-09-02 DIAGNOSIS — R600 Localized edema: Secondary | ICD-10-CM

## 2013-09-02 MED ORDER — FUROSEMIDE 20 MG PO TABS: 20.0000 mg | ORAL_TABLET | Freq: Every day | ORAL | Status: DC

## 2013-09-02 MED ORDER — HYDROCHLOROTHIAZIDE 25 MG PO TABS: 25.0000 mg | ORAL_TABLET | Freq: Every day | ORAL | Status: DC

## 2013-09-02 MED ORDER — CARVEDILOL 6.25 MG PO TABS: 6.2500 mg | ORAL_TABLET | Freq: Two times a day (BID) | ORAL | Status: DC

## 2013-09-02 NOTE — Assessment & Plan Note (Addendum)
He might require wrist surgery. No further testing needed. Acceptable risk for wrist surgery. Would suggest minimizing  IV fluids in the surgical period and In postop

## 2013-09-02 NOTE — Assessment & Plan Note (Addendum)
No recurrence of her cardiomyopathy. This was 18 years ago. Seems to have done well since then.

## 2013-09-02 NOTE — Assessment & Plan Note (Signed)
Is restarted low-dose Coreg, we will hold the triamterene HCTZ and have her continue HCTZ 25 mg daily

## 2013-09-02 NOTE — Assessment & Plan Note (Signed)
Prior history of tachycardia arrhythmia. If symptoms recur, could offer diltiazem 30 mg as needed. Continue to advance her Coreg as tolerated

## 2013-09-02 NOTE — Assessment & Plan Note (Signed)
Chronic lower extremity edema, mild on today's visit. Likely a combination of things including chronic venous insufficiency, unable to exclude mild chronic diastolic CHF. She reports prior venous thrombus. Recommended leg elevation, compression hose. Lasix provided for her to take only as needed for severe leg swelling.

## 2013-09-02 NOTE — Progress Notes (Signed)
Patient ID: Amanda Humphrey, female    DOB: [birth year], 48 y.o.   MRN: [id number]  HPI Comments: Ms. Amanda Humphrey is a very pleasant 48 year old woman with remote history of cardiomyopathy approximately 18 years ago after having her third child with mild depression of her ejection fraction which returned back to normal 6 months later, baseline right bundle branch block, periodic SVT, palpitations, lower extremity swelling who presents to establish care in the [place] office.  She reports that she is proceeding with evaluation of her wrist which might require surgical repair.  Given her prior history many years ago, she is establishing care in [place] and seeking cardiac clearance.  In general she feels well with no significant shortness of breath or chest pain. She has baseline chronic trace to mild lower extremity edema, that appears to be dependent, worse after a long day of sitting and standing. She wears compression hose with improvement of her symptoms. She also has mild relief taking her triamterene HCTZ. This has been relatively stable over many years,.  She does report having palpitations sometimes in the daytime with exertion, sometimes at nighttime. Symptoms typically resolve with rest. Previous episodes of SVT, none recently..  Prior history of cardiopathy many years ago that seemed to significantly improve with aggressive diuresis. Approximately 18 years ago, Lasix was provided with 16 pound weight loss and improvement of her symptoms. On her fourth child she had fluid retention but no recurrent cardiomyopathy  She does not do any regular exercise program. She leads a busy lifestyle, works in the hospital EKG today shows normal sinus rhythm with right bundle branch block, rate 67 beats per minute. Unchanged from prior EKG in November 2012   prior echocardiogram 12/16/2010 showing normal ejection fraction rate and 55%, mild MR      Outpatient Encounter Prescriptions as of 09/02/2013   Medication Sig  .  triamterene-hydrochlorothiazide (DYAZIDE) 37.5-25 MG per capsule Take 1 capsule by mouth every morning.   Review of Systems  Constitutional: Negative.   HENT: Negative.   Eyes: Negative.   Respiratory: Positive for shortness of breath.   Cardiovascular: Positive for leg swelling.  Gastrointestinal: Negative.   Endocrine: Negative.   Musculoskeletal: Negative.   Skin: Negative.   Allergic/Immunologic: Negative.   Neurological: Negative.   Hematological: Negative.   Psychiatric/Behavioral: Negative.   All other systems reviewed and are negative.   BP 110/86  Pulse 67  Ht 5\' 9"  (1.753 m)  Wt 250 lb 4 oz (113.513 kg)  BMI 36.94 kg/m2  Physical Exam  Nursing note and vitals reviewed. Constitutional: She is oriented to person, place, and time. She appears well-developed and well-nourished.  HENT:  Head: Normocephalic.  Nose: Nose normal.  Mouth/Throat: Oropharynx is clear and moist.  Eyes: Conjunctivae are normal. Pupils are equal, round, and reactive to light.  Neck: Normal range of motion. Neck supple. No JVD present.  Cardiovascular: Normal rate, regular rhythm, S1 normal, S2 normal, normal heart sounds and intact distal pulses.  Exam reveals no gallop and no friction rub.   No murmur heard. Pulmonary/Chest: Effort normal and breath sounds normal. No respiratory distress. She has no wheezes. She has no rales. She exhibits no tenderness.  Abdominal: Soft. Bowel sounds are normal. She exhibits no distension. There is no tenderness.  Musculoskeletal: Normal range of motion. She exhibits no edema and no tenderness.  Lymphadenopathy:    She has no cervical adenopathy.  Neurological: She is alert and oriented to person, place, and time. Coordination normal.  Skin: Skin is warm and dry. No rash noted. No erythema.  Psychiatric: She has a normal mood and affect. Her behavior is normal. Judgment and thought content normal.    Assessment and Plan

## 2013-09-02 NOTE — Assessment & Plan Note (Signed)
I suspect she is having APCs, possibly PVCs. Unable to exclude other arrhythmia but symptoms are typically short-lived. We'll start low-dose Coreg 3.125 mg twice a day with slow titration upwards for symptoms. If no dramatic improvement, would consider a Holter monitor.

## 2013-09-02 NOTE — Patient Instructions (Addendum)
You are doing well.  Consider buying compression hose with a zipper  Hold the triamterene/HCTZ Start coreg 1/2 pill twice a day Start HCTZ 25 mg one a day  Take lasix as needed for leg swelling Take with potassium  If you continue to have palpitations, call the office We would order a holter monitor  Please call us if you have new issues that need to be addressed before your next appt.  Your physician wants you to follow-up in: 12 months.  You will receive a reminder letter in the mail two months in advance. If you don't receive a letter, please call our office to schedule the follow-up appointment.

## 2013-09-02 NOTE — Assessment & Plan Note (Signed)
We have encouraged continued exercise, careful diet management in an effort to lose weight. 

## 2013-09-03 LAB — CBC WITH DIFFERENTIAL
Basophils Absolute: 0 10*3/uL (ref 0.0–0.2)
Basos: 1 %
Eos: 3 %
Eosinophils Absolute: 0.1 10*3/uL (ref 0.0–0.4)
HCT: 40.8 % (ref 34.0–46.6)
Hemoglobin: 13.5 g/dL (ref 11.1–15.9)
Immature Grans (Abs): 0 10*3/uL (ref 0.0–0.1)
Immature Granulocytes: 0 %
Lymphocytes Absolute: 1.6 10*3/uL (ref 0.7–3.1)
Lymphs: 32 %
MCH: 29.6 pg (ref 26.6–33.0)
MCHC: 33.1 g/dL (ref 31.5–35.7)
MCV: 90 fL (ref 79–97)
Monocytes Absolute: 0.2 10*3/uL (ref 0.1–0.9)
Monocytes: 4 %
Neutrophils Absolute: 3.1 10*3/uL (ref 1.4–7.0)
Neutrophils Relative %: 60 %
Platelets: 295 10*3/uL (ref 150–379)
RBC: 4.56 x10E6/uL (ref 3.77–5.28)
RDW: 14.1 % (ref 12.3–15.4)
WBC: 5.1 10*3/uL (ref 3.4–10.8)

## 2013-09-03 LAB — TSH+FREE T4
Free T4: 1.22 ng/dL (ref 0.82–1.77)
TSH: 0.997 u[IU]/mL (ref 0.450–4.500)

## 2013-09-03 LAB — VITAMIN D 25 HYDROXY (VIT D DEFICIENCY, FRACTURES): Vit D, 25-Hydroxy: 24.2 ng/mL — ABNORMAL LOW (ref 30.0–100.0)

## 2013-09-14 ENCOUNTER — Other Ambulatory Visit: Payer: Self-pay | Admitting: Cardiovascular Disease

## 2013-09-18 ENCOUNTER — Other Ambulatory Visit: Payer: Self-pay | Admitting: Cardiovascular Disease

## 2013-09-20 ENCOUNTER — Other Ambulatory Visit: Payer: Self-pay

## 2013-11-29 ENCOUNTER — Ambulatory Visit (INDEPENDENT_AMBULATORY_CARE_PROVIDER_SITE_OTHER): Payer: 59

## 2013-11-29 DIAGNOSIS — Z23 Encounter for immunization: Secondary | ICD-10-CM

## 2013-12-02 ENCOUNTER — Encounter: Payer: Self-pay | Admitting: Internal Medicine

## 2013-12-02 ENCOUNTER — Ambulatory Visit (INDEPENDENT_AMBULATORY_CARE_PROVIDER_SITE_OTHER): Payer: 59 | Admitting: Internal Medicine

## 2013-12-02 VITALS — BP 128/68 | HR 63 | Temp 98.4°F | Resp 16 | Ht 69.0 in | Wt 245.8 lb

## 2013-12-02 DIAGNOSIS — R5383 Other fatigue: Secondary | ICD-10-CM

## 2013-12-02 DIAGNOSIS — Z0000 Encounter for general adult medical examination without abnormal findings: Secondary | ICD-10-CM

## 2013-12-02 DIAGNOSIS — E559 Vitamin D deficiency, unspecified: Secondary | ICD-10-CM

## 2013-12-02 DIAGNOSIS — Z1239 Encounter for other screening for malignant neoplasm of breast: Secondary | ICD-10-CM

## 2013-12-02 DIAGNOSIS — E669 Obesity, unspecified: Secondary | ICD-10-CM

## 2013-12-02 DIAGNOSIS — E785 Hyperlipidemia, unspecified: Secondary | ICD-10-CM

## 2013-12-02 DIAGNOSIS — I10 Essential (primary) hypertension: Secondary | ICD-10-CM

## 2013-12-02 LAB — COMPREHENSIVE METABOLIC PANEL
ALT: 17 U/L (ref 0–35)
AST: 17 U/L (ref 0–37)
Albumin: 3.6 g/dL (ref 3.5–5.2)
Alkaline Phosphatase: 80 U/L (ref 39–117)
BUN: 12 mg/dL (ref 6–23)
CO2: 29 mEq/L (ref 19–32)
Calcium: 9.4 mg/dL (ref 8.4–10.5)
Chloride: 102 mEq/L (ref 96–112)
Creatinine, Ser: 0.9 mg/dL (ref 0.4–1.2)
GFR: 69.76 mL/min (ref >60.00)
Glucose, Bld: 79 mg/dL (ref 70–99)
Potassium: 3.6 mEq/L (ref 3.5–5.1)
Sodium: 137 mEq/L (ref 135–145)
Total Bilirubin: 1 mg/dL (ref 0.2–1.2)
Total Protein: 7 g/dL (ref 6.0–8.3)

## 2013-12-02 LAB — CBC WITH DIFFERENTIAL/PLATELET
Basophils Absolute: 0.1 10*3/uL (ref 0.0–0.1)
Basophils Relative: 1 % (ref 0.0–3.0)
Eosinophils Absolute: 0.2 10*3/uL (ref 0.0–0.7)
Eosinophils Relative: 2.5 % (ref 0.0–5.0)
HCT: 40 % (ref 36.0–46.0)
Hemoglobin: 13.4 g/dL (ref 12.0–15.0)
Lymphocytes Relative: 32.7 % (ref 12.0–46.0)
Lymphs Abs: 1.9 10*3/uL (ref 0.7–4.0)
MCHC: 33.6 g/dL (ref 30.0–36.0)
MCV: 88.6 fl (ref 78.0–100.0)
Monocytes Absolute: 0.3 10*3/uL (ref 0.1–1.0)
Monocytes Relative: 4.5 % (ref 3.0–12.0)
Neutro Abs: 3.5 10*3/uL (ref 1.4–7.7)
Neutrophils Relative %: 59.3 % (ref 43.0–77.0)
Platelets: 276 10*3/uL (ref 150.0–400.0)
RBC: 4.51 Mil/uL (ref 3.87–5.11)
RDW: 13.9 % (ref 11.5–15.5)
WBC: 5.9 10*3/uL (ref 4.0–10.5)

## 2013-12-02 LAB — LIPID PANEL
Cholesterol: 207 mg/dL — ABNORMAL HIGH (ref 0–200)
HDL: 47.1 mg/dL (ref >39.00)
LDL Cholesterol: 127 mg/dL — ABNORMAL HIGH (ref 0–99)
NonHDL: 159.9
Total CHOL/HDL Ratio: 4
Triglycerides: 163 mg/dL — ABNORMAL HIGH (ref 0.0–149.0)
VLDL: 32.6 mg/dL (ref 0.0–40.0)

## 2013-12-02 LAB — VITAMIN D 25 HYDROXY (VIT D DEFICIENCY, FRACTURES): VITD: 17.91 ng/mL — ABNORMAL LOW (ref 30.00–100.00)

## 2013-12-02 MED ORDER — PHENTERMINE HCL 37.5 MG PO TABS: ORAL_TABLET | ORAL | Status: DC

## 2013-12-02 NOTE — Progress Notes (Signed)
Pre-visit discussion using our clinic review tool. No additional management support is needed unless otherwise documented below in the visit note.  

## 2013-12-02 NOTE — Progress Notes (Signed)
Patient ID: Amanda Humphrey, female   DOB: Apr 26, 1965, 48 y.o.   MRN: [id number]   Subjective:     Amanda Humphrey is a 48 y.o. female and is here for a comprehensive physical exam. The patient reports problems - as follows:Marland Kitchen.  She continues to have moderate to severe right arm pain with regain of only  25% ROM thus far 12 weeks post op.  Sees  [facility] folllow up next week Returns to work part time next Monday  No use of right arm per [facility]   Lots of familial stress,  Mom is in a skilled facility for dementia, with behavioral issues and multiple falls, but remains highly functioning.  Worried to death about her long term care. Doesn't ambulate well ,  Incontinent but appears to be highly functional intellectually and has covered for years.   Father lives at home, 6277, yrs old diabetic CAD .  Obesity : has lost 5 lbs ,  Trying hard but has increased appetite and limited use of right arm so exercise has been difficult to maintain.    History   Social History  . Marital Status: Married    Spouse Name: N/A    Number of Children: N/A  . Years of Education: N/A   Occupational History  . Not on file.   Social History Main Topics  . Smoking status: Never Smoker   . Smokeless tobacco: Not on file  . Alcohol Use: Yes  . Drug Use: No  . Sexual Activity: Not on file   Other Topics Concern  . Not on file   Social History Narrative   Health Maintenance  Topic Date Due  . INFLUENZA VACCINE  08/28/2014  . PAP SMEAR  12/03/2015  . TETANUS/TDAP  02/24/2019    The following portions of the patient's history were reviewed and updated as appropriate: allergies, current medications, past family history, past medical history, past social history, past surgical history and problem list.  Review of Systems A comprehensive review of systems was negative.   Objective:  BP 128/68 mmHg  Pulse 63  Temp(Src) 98.4 F (36.9 C) (Oral)  Resp 16  Ht 5\' 9"  (1.753 m)  Wt 245 lb 12 oz (111.471 kg)  BMI  36.27 kg/m2  SpO2 99%    General appearance: alert, cooperative and appears stated age Ears: normal TM's and external ear canals both ears Throat: lips, mucosa, and tongue normal; teeth and gums normal Neck: no adenopathy, no carotid bruit, supple, symmetrical, trachea midline and thyroid not enlarged, symmetric, no tenderness/mass/nodules Back: symmetric, no curvature. ROM normal. No CVA tenderness. Lungs: clear to auscultation bilaterally Heart: regular rate and rhythm, S1, S2 normal, no murmur, click, rub or gallop Abdomen: soft, non-tender; bowel sounds normal; no masses,  no organomegaly Pulses: 2+ and symmetric Skin: Skin color, texture, turgor normal. No rashes or lesions Lymph nodes: Cervical, supraclavicular, and axillary nodes normal.  Assessment and Plan:   Obesity (BMI 30-39.9) I have addressed  BMI and recommended wt loss of 10% of body weigh over the next 6 months using a low glycemic index diet and regular exercise a minimum of 5 days per week.  She has had difficulty losing weight due to increased appetite and  Orthopedic isses and is requesting a trial of  Phentermine.  She is aware of the possible side effects and risks and understands that    The medication will be discontinued if she has not lost 5% of her body weight over the next  3 months, which , based on today's weight is 12 lbs.  Essential hypertension Well controlled on current regimen. Renal function stable, no changes today.  Lab Results  Component Value Date   CREATININE 0.9 12/02/2013   Lab Results  Component Value Date   NA 137 12/02/2013   K 3.6 12/02/2013   CL 102 12/02/2013   CO2 29 12/02/2013     Visit for preventive health examination Annual preventive  exam was done as well as a comprehensive physical exam and management of acute and chronic conditions .  During the course of the visit the patient was educated and counseled about appropriate screening and preventive services including : fall  prevention , diabetes screening, nutrition counseling, colorectal cancer screening, and recommended immunizations.  Printed recommendations for health maintenance screenings was given.    Updated Medication List Outpatient Encounter Prescriptions as of 12/02/2013  Medication Sig  . carvedilol (COREG) 6.25 MG tablet Take 1 tablet (6.25 mg total) by mouth 2 (two) times daily.  . furosemide (LASIX) 20 MG tablet Take 1 tablet (20 mg total) by mouth daily.  . hydrochlorothiazide (HYDRODIURIL) 25 MG tablet Take 1 tablet (25 mg total) by mouth daily.  . methocarbamol (ROBAXIN) 500 MG tablet Take 500 mg by mouth 4 (four) times daily.  . phentermine (ADIPEX-P) 37.5 MG tablet 1/2 tablet in the am and early afternoon

## 2013-12-02 NOTE — Patient Instructions (Signed)
Goal is  12 lbs / 3  months   I have written a prescription for  phentermine for 3 months.  If you have not lost 12 lbs (which is 5% of your starting weight)  by the end of the  3 months, the risks of continuing the medication outweigh the benefits and  we will have to discontinue it and find a Plan B .   Health Maintenance Adopting a healthy lifestyle and getting preventive care can go a long way to promote health and wellness. Talk with your health care provider about what schedule of regular examinations is right for you. This is a good chance for you to check in with your provider about disease prevention and staying healthy. In between checkups, there are plenty of things you can do on your own. Experts have done a lot of research about which lifestyle changes and preventive measures are most likely to keep you healthy. Ask your health care provider for more information. WEIGHT AND DIET  Eat a healthy diet  Be sure to include plenty of vegetables, fruits, low-fat dairy products, and lean protein.  Do not eat a lot of foods high in solid fats, added sugars, or salt.  Get regular exercise. This is one of the most important things you can do for your health.  Most adults should exercise for at least 150 minutes each week. The exercise should increase your heart rate and make you sweat (moderate-intensity exercise).  Most adults should also do strengthening exercises at least twice a week. This is in addition to the moderate-intensity exercise.  Maintain a healthy weight  Body mass index (BMI) is a measurement that can be used to identify possible weight problems. It estimates body fat based on height and weight. Your health care provider can help determine your BMI and help you achieve or maintain a healthy weight.  For females 57 years of age and older:   A BMI below 18.5 is considered underweight.  A BMI of 18.5 to 24.9 is normal.  A BMI of 25 to 29.9 is considered  overweight.  A BMI of 30 and above is considered obese.  Watch levels of cholesterol and blood lipids  You should start having your blood tested for lipids and cholesterol at 49 years of age, then have this test every 5 years.  You may need to have your cholesterol levels checked more often if:  Your lipid or cholesterol levels are high.  You are older than 49 years of age.  You are at high risk for heart disease.  CANCER SCREENING   Lung Cancer  Lung cancer screening is recommended for adults 34-41 years old who are at high risk for lung cancer because of a history of smoking.  A yearly low-dose CT scan of the lungs is recommended for people who:  Currently smoke.  Have quit within the past 15 years.  Have at least a 30-pack-year history of smoking. A pack year is smoking an average of one pack of cigarettes a day for 1 year.  Yearly screening should continue until it has been 15 years since you quit.  Yearly screening should stop if you develop a health problem that would prevent you from having lung cancer treatment.  Breast Cancer  Practice breast self-awareness. This means understanding how your breasts normally appear and feel.  It also means doing regular breast self-exams. Let your health care provider know about any changes, no matter how small.  If you are in  your 20s or 30s, you should have a clinical breast exam (CBE) by a health care provider every 1-3 years as part of a regular health exam.  If you are 58 or older, have a CBE every year. Also consider having a breast X-ray (mammogram) every year.  If you have a family history of breast cancer, talk to your health care provider about genetic screening.  If you are at high risk for breast cancer, talk to your health care provider about having an MRI and a mammogram every year.  Breast cancer gene (BRCA) assessment is recommended for women who have family members with BRCA-related cancers. BRCA-related  cancers include:  Breast.  Ovarian.  Tubal.  Peritoneal cancers.  Results of the assessment will determine the need for genetic counseling and BRCA1 and BRCA2 testing. Cervical Cancer Routine pelvic examinations to screen for cervical cancer are no longer recommended for nonpregnant women who are considered low risk for cancer of the pelvic organs (ovaries, uterus, and vagina) and who do not have symptoms. A pelvic examination may be necessary if you have symptoms including those associated with pelvic infections. Ask your health care provider if a screening pelvic exam is right for you.   The Pap test is the screening test for cervical cancer for women who are considered at risk.  If you had a hysterectomy for a problem that was not cancer or a condition that could lead to cancer, then you no longer need Pap tests.  If you are older than 65 years, and you have had normal Pap tests for the past 10 years, you no longer need to have Pap tests.  If you have had past treatment for cervical cancer or a condition that could lead to cancer, you need Pap tests and screening for cancer for at least 20 years after your treatment.  If you no longer get a Pap test, assess your risk factors if they change (such as having a new sexual partner). This can affect whether you should start being screened again.  Some women have medical problems that increase their chance of getting cervical cancer. If this is the case for you, your health care provider may recommend more frequent screening and Pap tests.  The human papillomavirus (HPV) test is another test that may be used for cervical cancer screening. The HPV test looks for the virus that can cause cell changes in the cervix. The cells collected during the Pap test can be tested for HPV.  The HPV test can be used to screen women 7 years of age and older. Getting tested for HPV can extend the interval between normal Pap tests from three to five  years.  An HPV test also should be used to screen women of any age who have unclear Pap test results.  After 48 years of age, women should have HPV testing as often as Pap tests.  Colorectal Cancer  This type of cancer can be detected and often prevented.  Routine colorectal cancer screening usually begins at 49 years of age and continues through 49 years of age.  Your health care provider may recommend screening at an earlier age if you have risk factors for colon cancer.  Your health care provider may also recommend using home test kits to check for hidden blood in the stool.  A small camera at the end of a tube can be used to examine your colon directly (sigmoidoscopy or colonoscopy). This is done to check for the earliest forms of  colorectal cancer.  Routine screening usually begins at age 37.  Direct examination of the colon should be repeated every 5-10 years through 49 years of age. However, you may need to be screened more often if early forms of precancerous polyps or small growths are found. Skin Cancer  Check your skin from head to toe regularly.  Tell your health care provider about any new moles or changes in moles, especially if there is a change in a mole's shape or color.  Also tell your health care provider if you have a mole that is larger than the size of a pencil eraser.  Always use sunscreen. Apply sunscreen liberally and repeatedly throughout the day.  Protect yourself by wearing long sleeves, pants, a wide-brimmed hat, and sunglasses whenever you are outside. HEART DISEASE, DIABETES, AND HIGH BLOOD PRESSURE   Have your blood pressure checked at least every 1-2 years. High blood pressure causes heart disease and increases the risk of stroke.  If you are between 72 years and 9 years old, ask your health care provider if you should take aspirin to prevent strokes.  Have regular diabetes screenings. This involves taking a blood sample to check your fasting  blood sugar level.  If you are at a normal weight and have a low risk for diabetes, have this test once every three years after 49 years of age.  If you are overweight and have a high risk for diabetes, consider being tested at a younger age or more often. PREVENTING INFECTION  Hepatitis B  If you have a higher risk for hepatitis B, you should be screened for this virus. You are considered at high risk for hepatitis B if:  You were born in a country where hepatitis B is common. Ask your health care provider which countries are considered high risk.  Your parents were born in a high-risk country, and you have not been immunized against hepatitis B (hepatitis B vaccine).  You have HIV or AIDS.  You use needles to inject street drugs.  You live with someone who has hepatitis B.  You have had sex with someone who has hepatitis B.  You get hemodialysis treatment.  You take certain medicines for conditions, including cancer, organ transplantation, and autoimmune conditions. Hepatitis C  Blood testing is recommended for:  Everyone born from [birth year] through 1966.  Anyone with known risk factors for hepatitis C. Sexually transmitted infections (STIs)  You should be screened for sexually transmitted infections (STIs) including gonorrhea and chlamydia if:  You are sexually active and are younger than 49 years of age.  You are older than 49 years of age and your health care provider tells you that you are at risk for this type of infection.  Your sexual activity has changed since you were last screened and you are at an increased risk for chlamydia or gonorrhea. Ask your health care provider if you are at risk.  If you do not have HIV, but are at risk, it may be recommended that you take a prescription medicine daily to prevent HIV infection. This is called pre-exposure prophylaxis (PrEP). You are considered at risk if:  You are sexually active and do not regularly use condoms or know  the HIV status of your partner(s).  You take drugs by injection.  You are sexually active with a partner who has HIV. Talk with your health care provider about whether you are at high risk of being infected with HIV. If you choose to begin PrEP,  you should first be tested for HIV. You should then be tested every 3 months for as long as you are taking PrEP.  PREGNANCY   If you are premenopausal and you may become pregnant, ask your health care provider about preconception counseling.  If you may become pregnant, take 400 to 800 micrograms (mcg) of folic acid every day.  If you want to prevent pregnancy, talk to your health care provider about birth control (contraception). OSTEOPOROSIS AND MENOPAUSE   Osteoporosis is a disease in which the bones lose minerals and strength with aging. This can result in serious bone fractures. Your risk for osteoporosis can be identified using a bone density scan.  If you are 23 years of age or older, or if you are at risk for osteoporosis and fractures, ask your health care provider if you should be screened.  Ask your health care provider whether you should take a calcium or vitamin D supplement to lower your risk for osteoporosis.  Menopause may have certain physical symptoms and risks.  Hormone replacement therapy may reduce some of these symptoms and risks. Talk to your health care provider about whether hormone replacement therapy is right for you.  HOME CARE INSTRUCTIONS   Schedule regular health, dental, and eye exams.  Stay current with your immunizations.   Do not use any tobacco products including cigarettes, chewing tobacco, or electronic cigarettes.  If you are pregnant, do not drink alcohol.  If you are breastfeeding, limit how much and how often you drink alcohol.  Limit alcohol intake to no more than 1 drink per day for nonpregnant women. One drink equals 12 ounces of beer, 5 ounces of wine, or 1 ounces of hard liquor.  Do not  use street drugs.  Do not share needles.  Ask your health care provider for help if you need support or information about quitting drugs.  Tell your health care provider if you often feel depressed.  Tell your health care provider if you have ever been abused or do not feel safe at home. Document Released: 07/29/2010 Document Revised: 05/30/2013 Document Reviewed: 12/15/2012 [facility] Patient Information 2015 [place], [place]. This information is not intended to replace advice given to you by your health care provider. Make sure you discuss any questions you have with your health care provider.

## 2013-12-04 ENCOUNTER — Encounter: Payer: Self-pay | Admitting: Internal Medicine

## 2013-12-04 ENCOUNTER — Other Ambulatory Visit: Payer: Self-pay | Admitting: Internal Medicine

## 2013-12-04 DIAGNOSIS — Z0000 Encounter for general adult medical examination without abnormal findings: Secondary | ICD-10-CM | POA: Insufficient documentation

## 2013-12-04 MED ORDER — ERGOCALCIFEROL 1.25 MG (50000 UT) PO CAPS: 50000.0000 [IU] | ORAL_CAPSULE | ORAL | Status: DC

## 2013-12-04 NOTE — Assessment & Plan Note (Signed)
Well controlled on current regimen. Renal function stable, no changes today.  Lab Results  Component Value Date   CREATININE 0.9 12/02/2013   Lab Results  Component Value Date   NA 137 12/02/2013   K 3.6 12/02/2013   CL 102 12/02/2013   CO2 29 12/02/2013

## 2013-12-04 NOTE — Assessment & Plan Note (Signed)
Annual preventive   exam was done as well as a comprehensive physical exam and management of acute and chronic conditions .  During the course of the visit the patient was educated and counseled about appropriate screening and preventive services including : fall prevention , diabetes screening, nutrition counseling, colorectal cancer screening, and recommended immunizations.  Printed recommendations for health maintenance screenings was given.  

## 2013-12-04 NOTE — Assessment & Plan Note (Signed)
I have addressed  BMI and recommended wt loss of 10% of body weigh over the next 6 months using a low glycemic index diet and regular exercise a minimum of 5 days per week.  She has had difficulty losing weight due to increased appetite and  Orthopedic isses and is requesting a trial of  Phentermine.  She is aware of the possible side effects and risks and understands that    The medication will be discontinued if she has not lost 5% of her body weight over the next 3 months, which , based on today's weight is 12 lbs.

## 2014-04-07 ENCOUNTER — Ambulatory Visit: Payer: 59 | Admitting: Internal Medicine

## 2014-04-10 ENCOUNTER — Ambulatory Visit: Payer: 59 | Admitting: Internal Medicine

## 2014-04-10 ENCOUNTER — Encounter: Payer: Self-pay | Admitting: Internal Medicine

## 2014-04-10 ENCOUNTER — Ambulatory Visit (INDEPENDENT_AMBULATORY_CARE_PROVIDER_SITE_OTHER): Payer: 59 | Admitting: Internal Medicine

## 2014-04-10 VITALS — BP 126/84 | HR 76 | Temp 98.5°F | Resp 16 | Ht 69.0 in | Wt 234.5 lb

## 2014-04-10 DIAGNOSIS — M25531 Pain in right wrist: Secondary | ICD-10-CM

## 2014-04-10 DIAGNOSIS — E559 Vitamin D deficiency, unspecified: Secondary | ICD-10-CM

## 2014-04-10 DIAGNOSIS — I10 Essential (primary) hypertension: Secondary | ICD-10-CM

## 2014-04-10 DIAGNOSIS — E669 Obesity, unspecified: Secondary | ICD-10-CM

## 2014-04-10 LAB — VITAMIN D 25 HYDROXY (VIT D DEFICIENCY, FRACTURES): VITD: 18.31 ng/mL — ABNORMAL LOW (ref 30.00–100.00)

## 2014-04-10 MED ORDER — PHENTERMINE HCL 37.5 MG PO TABS: ORAL_TABLET | ORAL | Status: DC

## 2014-04-10 NOTE — Patient Instructions (Signed)
I have authorized the use of phentermine for more 3 months. Your goal is a minimum of 12 lbs  .

## 2014-04-10 NOTE — Progress Notes (Signed)
Pre-visit discussion using our clinic review tool. No additional management support is needed unless otherwise documented below in the visit note.  

## 2014-04-10 NOTE — Progress Notes (Signed)
Patient ID: Amanda Humphrey, female   DOB: February 11, 1965, 49 y.o.   MRN: [id number]  Patient Active Problem List   Diagnosis Date Noted  . Right wrist pain 04/11/2014  . Visit for preventive health examination 12/04/2013  . Lower extremity edema 09/02/2013  . Essential hypertension 09/02/2013  . History of cardiomyopathy 09/02/2013  . SVT (supraventricular tachycardia) 09/02/2013  . Palpitations 09/02/2013  . Preop cardiovascular exam 09/02/2013  . Contrast media allergy 03/10/2013  . Toxic effect of venom of caterpillars, unintentional 10/25/2012  . Fibrocystic breast disease   . Obesity (BMI 30-39.9) 02/05/2012  . Meniere disease     Subjective:  CC:   Chief Complaint  Patient presents with  . Follow-up    weight and phentermine    HPI:   Amanda Humphrey is a 49 y.o. female who presents for  Follow up on multiple issues including wrist injury and obesity with medication prescribed.    She has been using phentermine for control of appetite and reports no side effects.  Wt loss has been less than anticipated but patietn states that the holidays and se eral family stressors have been obstacles to significant loss.  She contineus to have right wrist pain following a work related fall which resulted in a tendon tear  That went undiagnosed for several months.  Despite corrective surgery by Dr. Amanda PeaGramig, her  Is quite limited in the wrist. She has maxed out FMLA for a [employer], and has had difficulty gfetting authorizatio for a prescribed  Wrist brace she needs despite [facility].  She is afraid to complain to HR  bc she fears retaliation and  retribution .  She contineus to have  Prescribed PT .  Gramig wants to do a nerve conduction study and offered more surgery and second opinion, but she has declined   Her father had a CVA in December,  Brainstem.  He is now living with her sister. .     Past Medical History  Diagnosis Date  . Allergy   . Cardiac arrhythmia  due to congenital heart disease   . Chronic kidney disease     stones  . Migraines   . Clotting disorder   . Meniere disease   . Fibrocystic breast disease 2013    Past Surgical History  Procedure Laterality Date  . Cholecystectomy  2011  . Appendectomy  1999  . Tonsillectomy and adenoidectomy  1975  . Cesarean section      4 total  . Breast biopsy  Nov 2012    right,  fibrocystic disease, Michela Pitcherly  . Abdominal hysterectomy  2011    laprascopic supracervical, DeFr[place]co       The following portions of the patient's history were reviewed and updated as appropriate: Allergies, current medications, and problem list.    Review of Systems:   Patient denies headache, fevers, malaise, unintentional weight loss, skin rash, eye pain, sinus congestion and sinus pain, sore throat, dysphagia,  hemoptysis , cough, dyspnea, wheezing, chest pain, palpitations, orthopnea, edema, abdominal pain, nausea, melena, diarrhea, constipation, flank pain, dysuria, hematuria, urinary  Frequency, nocturia, numbness, tingling, seizures,  Focal weakness, Loss of consciousness,  Tremor, insomnia, depression, anxiety, and suicidal ideation.     History   Social History  . Marital Status: Married    Spouse Name: N/A  . Number of Children: N/A  . Years of Education: N/A   Occupational History  . Not on file.   Social History Main  Topics  . Smoking status: Never Smoker   . Smokeless tobacco: Not on file  . Alcohol Use: Yes  . Drug Use: No  . Sexual Activity: Not on file   Other Topics Concern  . Not on file   Social History Narrative    Objective:  Filed Vitals:   04/10/14 0825  BP: 126/84  Pulse: 76  Temp: 98.5 F (36.9 C)  Resp: 16     General appearance: alert, cooperative and appears stated age Ears: normal TM's and external ear canals both ears Throat: lips, mucosa, and tongue normal; teeth and gums normal Neck: no adenopathy, no carotid bruit, supple, symmetrical, trachea  midline and thyroid not enlarged, symmetric, no tenderness/mass/nodules Back: symmetric, no curvature. ROM normal. No CVA tenderness. Lungs: clear to auscultation bilaterally Heart: regular rate and rhythm, S1, S2 normal, no murmur, click, rub or gallop Abdomen: soft, non-tender; bowel sounds normal; no masses,  no organomegaly Pulses: 2+ and symmetric Skin: Skin color, texture, turgor normal. No rashes or lesions Lymph nodes: Cervical, supraclavicular, and axillary nodes normal.  Assessment and Plan:  Obesity (BMI 30-39.9) She has only lost 9 lbs since last visit , goal was 12, but has had several setbacks which she has dealt with,  counselling on exercise and diet given  Phentermine refilled for 3 months .     Essential hypertension Well controlled on current regimen. Renal function stable, no changes today.  Lab Results  Component Value Date   CREATININE 0.9 12/02/2013   Lab Results  Component Value Date   NA 137 12/02/2013   K 3.6 12/02/2013   CL 102 12/02/2013   CO2 29 12/02/2013      Right wrist pain Now chronic, secondary to tendon rupture during a fall which occurred at work.   Managed by hand surgeon Gramig,  No records or imaging studies available. Records requested.    A total of 40 minutes of face to face time was spent with patient more than half of which was spent in counselling and coordination of care   Updated Medication List Outpatient Encounter Prescriptions as of 04/10/2014  Medication Sig  . carvedilol (COREG) 6.25 MG tablet Take 1 tablet (6.25 mg total) by mouth 2 (two) times daily.  . Cholecalciferol (VITAMIN D3) 2000 UNITS TABS Take 1 tablet by mouth daily.  . furosemide (LASIX) 20 MG tablet Take 1 tablet (20 mg total) by mouth daily.  . hydrochlorothiazide (HYDRODIURIL) 25 MG tablet Take 1 tablet (25 mg total) by mouth daily.  . methocarbamol (ROBAXIN) 500 MG tablet Take 500 mg by mouth 4 (four) times daily.  . phentermine (ADIPEX-P) 37.5 MG  tablet 1/2 tablet in the am and early afternoon  . [DISCONTINUED] phentermine (ADIPEX-P) 37.5 MG tablet 1/2 tablet in the am and early afternoon  . [DISCONTINUED] ergocalciferol (DRISDOL) 50000 UNITS capsule Take 1 capsule (50,000 Units total) by mouth once a week. (Patient not taking: Reported on 04/10/2014)     Orders Placed This Encounter  Procedures  . Vit D  25 hydroxy (rtn osteoporosis monitoring)    No Follow-up on file.

## 2014-04-11 ENCOUNTER — Encounter: Payer: Self-pay | Admitting: Internal Medicine

## 2014-04-11 DIAGNOSIS — M25531 Pain in right wrist: Secondary | ICD-10-CM | POA: Insufficient documentation

## 2014-04-11 NOTE — Assessment & Plan Note (Signed)
She has only lost 9 lbs since last visit , goal was 12, but has had several setbacks which she has dealt with,  counselling on exercise and diet given  Phentermine refilled for 3 months .

## 2014-04-11 NOTE — Assessment & Plan Note (Signed)
Well controlled on current regimen. Renal function stable, no changes today.  Lab Results  Component Value Date   CREATININE 0.9 12/02/2013   Lab Results  Component Value Date   NA 137 12/02/2013   K 3.6 12/02/2013   CL 102 12/02/2013   CO2 29 12/02/2013    

## 2014-04-11 NOTE — Assessment & Plan Note (Signed)
Now chronic, secondary to tendon rupture during a fall which occurred at work.   Managed by hand surgeon Gramig,  No records or imaging studies available. Records requested.

## 2014-04-12 ENCOUNTER — Other Ambulatory Visit: Payer: Self-pay | Admitting: Internal Medicine

## 2014-04-12 ENCOUNTER — Encounter: Payer: Self-pay | Admitting: Internal Medicine

## 2014-04-12 MED ORDER — ERGOCALCIFEROL 1.25 MG (50000 UT) PO CAPS: 50000.0000 [IU] | ORAL_CAPSULE | ORAL | Status: DC

## 2014-04-28 ENCOUNTER — Encounter: Payer: Self-pay | Admitting: *Deleted

## 2014-04-28 ENCOUNTER — Ambulatory Visit
Admit: 2014-04-28 | Disposition: A | Discharge status: Home / Self Care | Payer: Self-pay | Attending: Internal Medicine | Admitting: Internal Medicine

## 2014-04-28 LAB — HM MAMMOGRAPHY: HM Mammogram: NEGATIVE

## 2014-05-02 NOTE — Telephone Encounter (Signed)
Mailed patient copy of unread my chart message. 

## 2014-05-17 ENCOUNTER — Other Ambulatory Visit: Payer: Self-pay | Admitting: Cardiovascular Disease

## 2014-05-23 ENCOUNTER — Encounter: Payer: Self-pay | Admitting: Internal Medicine

## 2014-06-20 ENCOUNTER — Ambulatory Visit
Admission: EM | Admit: 2014-06-20 | Discharge: 2014-06-20 | Disposition: A | Discharge status: Home / Self Care | Payer: 59 | Attending: Family Medicine | Admitting: Family Medicine

## 2014-06-20 ENCOUNTER — Ambulatory Visit: Payer: 59

## 2014-06-20 DIAGNOSIS — H6692 Otitis media, unspecified, left ear: Secondary | ICD-10-CM

## 2014-06-20 DIAGNOSIS — N189 Chronic kidney disease, unspecified: Secondary | ICD-10-CM | POA: Insufficient documentation

## 2014-06-20 DIAGNOSIS — H9202 Otalgia, left ear: Secondary | ICD-10-CM | POA: Diagnosis present

## 2014-06-20 DIAGNOSIS — H6592 Unspecified nonsuppurative otitis media, left ear: Secondary | ICD-10-CM | POA: Diagnosis not present

## 2014-06-20 DIAGNOSIS — R05 Cough: Secondary | ICD-10-CM | POA: Diagnosis present

## 2014-06-20 DIAGNOSIS — J0100 Acute maxillary sinusitis, unspecified: Secondary | ICD-10-CM

## 2014-06-20 DIAGNOSIS — R062 Wheezing: Secondary | ICD-10-CM | POA: Diagnosis present

## 2014-06-20 DIAGNOSIS — J40 Bronchitis, not specified as acute or chronic: Secondary | ICD-10-CM | POA: Diagnosis not present

## 2014-06-20 MED ORDER — ALBUTEROL SULFATE HFA 108 (90 BASE) MCG/ACT IN AERS: 2.0000 | INHALATION_SPRAY | RESPIRATORY_TRACT | Status: DC | PRN

## 2014-06-20 MED ORDER — PREDNISONE 10 MG (21) PO TBPK: ORAL_TABLET | ORAL | Status: DC

## 2014-06-20 MED ORDER — SUPRAX 200 MG PO CHEW: 400.0000 mg | CHEWABLE_TABLET | Freq: Every day | ORAL | Status: DC

## 2014-06-20 MED ORDER — BENZONATATE 200 MG PO CAPS: 200.0000 mg | ORAL_CAPSULE | Freq: Three times a day (TID) | ORAL | Status: DC | PRN

## 2014-06-20 MED ORDER — PREDNISONE 10 MG (21) PO TBPK
ORAL_TABLET | ORAL | Status: DC
Start: 2014-06-20 — End: 2014-12-12

## 2014-06-20 NOTE — ED Provider Notes (Signed)
CSN: [id number]     Arrival date & time 06/20/14  4098 History   First MD Initiated Contact with Patient 06/20/14 1053     Chief Complaint  Patient presents with  . Cough  . Wheezing  . Otalgia   (Consider location/radiation/quality/duration/timing/severity/associated sxs/prior Treatment) Patient is a 49 y.o. female presenting with cough, wheezing, and ear pain. The history is provided by the patient. No language interpreter was used.  Cough Cough characteristics:  Productive and nocturnal Sputum characteristics:  Green Severity:  Severe Onset quality:  Gradual Duration:  1 week Timing:  Constant Progression:  Worsening (Saw one PAs employee health in place on a Z-Pak but she feels worse now) Chronicity:  New Smoker: no   Context: upper respiratory infection   Relieved by:  Nothing Ineffective treatments:  Decongestant and cough suppressants Associated symptoms: ear pain and wheezing   Ear pain:    Location:  Left   Severity:  Moderate   Onset quality:  Gradual   Duration:  1 week   Timing:  Constant   Progression:  Worsening   Chronicity:  New Risk factors: recent infection   Risk factors: no chemical exposure and no recent travel   Wheezing Severity:  Moderate Onset quality:  Unable to specify Timing:  Sporadic Progression:  Waxing and waning Chronicity:  New Relieved by:  Nothing Associated symptoms: cough and ear pain   Ear pain:    Location:  Left   Severity:  Moderate   Duration:  5 days   Timing:  Constant   Progression:  Worsening   Chronicity:  New Risk factors: no prior hospitalizations and no smoke inhalation   Otalgia Associated symptoms: cough     Past Medical History  Diagnosis Date  . Allergy   . Cardiac arrhythmia due to congenital heart disease   . Chronic kidney disease     stones  . Migraines   . Clotting disorder   . Meniere disease   . Fibrocystic breast disease 2013   Past Surgical History  Procedure Laterality Date  .  Cholecystectomy  2011  . Appendectomy  1999  . Tonsillectomy and adenoidectomy  1975  . Cesarean section      4 total  . Breast biopsy  Nov 2012    right,  fibrocystic disease, Michela Pitcher  . Abdominal hysterectomy  2011    laprascopic supracervical, [place]co   Family History  Problem Relation Age of Onset  . Hypertension Mother   . Cancer Mother     ovarian  . Arthritis Mother   . Hyperlipidemia Father   . Hypertension Father   . Diabetes Father   . Stroke Father   . Alzheimer's disease Maternal Grandmother   . Heart disease Maternal Grandmother   . Alzheimer's disease Paternal Grandmother    History  Substance Use Topics  . Smoking status: Never Smoker   . Smokeless tobacco: Not on file  . Alcohol Use: Yes   OB History    No data available     Review of Systems  HENT: Positive for ear pain.   Respiratory: Positive for cough and wheezing.   All other systems reviewed and are negative.   Allergies  Codeine; Hydrocodone; Tramadol; and Oxycodone  Home Medications   Prior to Admission medications   Medication Sig Start Date End Date Taking? Authorizing Provider  carvedilol (COREG) 6.25 MG tablet TAKE 1 TABLET BY MOUTH 2 TIMES DAILY. 05/17/14  Yes Antonieta Iba, MD  Cholecalciferol (VITAMIN D3) 2000 UNITS  TABS Take 1 tablet by mouth daily.   Yes Historical Provider, MD  ergocalciferol (DRISDOL) 50000 UNITS capsule Take 1 capsule (50,000 Units total) by mouth once a week. 04/12/14  Yes Sherlene Shamseresa L Tullo, MD  furosemide (LASIX) 20 MG tablet Take 1 tablet (20 mg total) by mouth daily. 09/02/13  Yes Antonieta Ibaimothy J Gollan, MD  hydrochlorothiazide (HYDRODIURIL) 25 MG tablet Take 1 tablet (25 mg total) by mouth daily. 09/02/13  Yes Antonieta Ibaimothy J Gollan, MD  albuterol (PROVENTIL HFA;VENTOLIN HFA) 108 (90 BASE) MCG/ACT inhaler Inhale 2 puffs into the lungs every 4 (four) hours as needed for wheezing or shortness of breath. 06/20/14   Hassan RowanEugene Hillel Card, MD  benzonatate (TESSALON) 200 MG capsule Take 1  capsule (200 mg total) by mouth 3 (three) times daily as needed for cough. 06/20/14   Hassan RowanEugene Georgian Mcclory, MD  methocarbamol (ROBAXIN) 500 MG tablet Take 500 mg by mouth 4 (four) times daily.    Historical Provider, MD  phentermine (ADIPEX-P) 37.5 MG tablet 1/2 tablet in the am and early afternoon 04/10/14   Sherlene Shamseresa L Tullo, MD  predniSONE (STERAPRED UNI-PAK 21 TAB) 10 MG (21) TBPK tablet All pills to be taken orally. 6 tablets day 1, 5 tablets daily 2, 4 tablets day 3, 3 tablets day 4, 2 tablets day 5, 1 tablet day 6 day 06/20/14   Hassan RowanEugene Kailin Principato, MD  SUPRAX 200 MG CHEW Chew 400 mg by mouth daily. Patient may have 400mg  1 po daily for 10 days instead if needed or requested 06/20/14   Hassan RowanEugene Porscha Axley, MD   BP 130/60 mmHg  Pulse 78  Temp(Src) 99.1 F (37.3 C) (Tympanic)  Resp 18  Ht 5\' 9"  (1.753 m)  Wt 225 lb (102.059 kg)  BMI 33.21 kg/m2  SpO2 100% Physical Exam  Constitutional: She is oriented to person, place, and time. She appears well-developed and well-nourished.  Ill-appearing white female.  HENT:  Head: Normocephalic.  Right Ear: Tympanic membrane is bulging. A middle ear effusion is present.  Left Ear: Tympanic membrane is erythematous and bulging.  Neck: Trachea normal. Neck supple. No tracheal deviation, no erythema and normal range of motion present. No thyroid mass and no thyromegaly present.  Cardiovascular: Normal rate.   Pulmonary/Chest: Effort normal and breath sounds normal.  Musculoskeletal: Normal range of motion.  Lymphadenopathy:    She has cervical adenopathy.  Neurological: She is alert and oriented to person, place, and time. She has normal reflexes.  Skin: Skin is warm and dry.  Vitals reviewed.  Since patient has failed a course of Zithromax will place on Suprax either 202 a day or 400 mg once a day for 10 days, increase Tessalon 200 mg 3 times a day, albuterol inhaler to use when necessary for bronchial spasm, prednisone for 6 days and continue current Zyrtec, short acting  pseudoephedrine and Phenergan DM as needed. Follow-up if not better in one week. Work note for today and tomorrow.  ED Course  Procedures (including critical care time) Labs Review Labs Reviewed - No data to display  Imaging Review Dg Chest 2 View  06/20/2014   CLINICAL DATA:  Cough, wheezing.  EXAM: CHEST  2 VIEW  COMPARISON:  April 04, 2011.  FINDINGS: The heart size and mediastinal contours are within normal limits. Both lungs are clear. No pneumothorax or pleural effusion is noted. Moderate dextroscoliosis of lower thoracic spine is noted.  IMPRESSION: No active cardiopulmonary disease.   Electronically Signed   By: Lupita RaiderJames  Green Jr, M.D.   On:  06/20/2014 11:40     MDM   1. Acute maxillary sinusitis, recurrence not specified   2. Left otitis media with effusion   3. Bronchitis        Hassan Rowan, MD 06/20/14 [phone]

## 2014-06-20 NOTE — Discharge Instructions (Signed)
Otitis Media Otitis media is redness, soreness, and puffiness (swelling) in the space just behind your eardrum (middle ear). It may be caused by allergies or infection. It often happens along with a cold. HOME CARE  Take your medicine as told. Finish it even if you start to feel better.  Only take over-the-counter or prescription medicines for pain, discomfort, or fever as told by your doctor.  Follow up with your doctor as told. GET HELP IF:  You have otitis media only in one ear, or bleeding from your nose, or both.  You notice a lump on your neck.  You are not getting better in 3-5 days.  You feel worse instead of better. GET HELP RIGHT AWAY IF:   You have pain that is not helped with medicine.  You have puffiness, redness, or pain around your ear.  You get a stiff neck.  You cannot move part of your face (paralysis).  You notice that the bone behind your ear hurts when you touch it. MAKE SURE YOU:   Understand these instructions.  Will watch your condition.  Will get help right away if you are not doing well or get worse. Document Released: 07/02/2007 Document Revised: 01/18/2013 Document Reviewed: 08/10/2012 [facility] Patient Information 2015 [place], [place]. This information is not intended to replace advice given to you by your health care provider. Make sure you discuss any questions you have with your health care provider.  How to Use an Inhaler Proper inhaler technique is very important. Good technique ensures that the medicine reaches the lungs. Poor technique results in depositing the medicine on the tongue and back of the throat rather than in the airways. If you do not use the inhaler with good technique, the medicine will not help you. STEPS TO FOLLOW IF USING AN INHALER WITHOUT AN EXTENSION TUBE  Remove the cap from the inhaler.  If you are using the inhaler for the first time, you will need to prime it. Shake the inhaler for 5 seconds and release four  puffs into the air, away from your face. Ask your health care provider or pharmacist if you have questions about priming your inhaler.  Shake the inhaler for 5 seconds before each breath in (inhalation).  Position the inhaler so that the top of the canister faces up.  Put your index finger on the top of the medicine canister. Your thumb supports the bottom of the inhaler.  Open your mouth.  Either place the inhaler between your teeth and place your lips tightly around the mouthpiece, or hold the inhaler 1-2 inches away from your open mouth. If you are unsure of which technique to use, ask your health care provider.  Breathe out (exhale) normally and as completely as possible.  Press the canister down with your index finger to release the medicine.  At the same time as the canister is pressed, inhale deeply and slowly until your lungs are completely filled. This should take 4-6 seconds. Keep your tongue down.  Hold the medicine in your lungs for 5-10 seconds (10 seconds is best). This helps the medicine get into the small airways of your lungs.  Breathe out slowly, through pursed lips. Whistling is an example of pursed lips.  Wait at least 15-30 seconds between puffs. Continue with the above steps until you have taken the number of puffs your health care provider has ordered. Do not use the inhaler more than your health care provider tells you.  Replace the cap on the inhaler.  Follow the directions from your health care provider or the inhaler insert for cleaning the inhaler. STEPS TO FOLLOW IF USING AN INHALER WITH AN EXTENSION (SPACER)  Remove the cap from the inhaler.  If you are using the inhaler for the first time, you will need to prime it. Shake the inhaler for 5 seconds and release four puffs into the air, away from your face. Ask your health care provider or pharmacist if you have questions about priming your inhaler.  Shake the inhaler for 5 seconds before each breath in  (inhalation).  Place the open end of the spacer onto the mouthpiece of the inhaler.  Position the inhaler so that the top of the canister faces up and the spacer mouthpiece faces you.  Put your index finger on the top of the medicine canister. Your thumb supports the bottom of the inhaler and the spacer.  Breathe out (exhale) normally and as completely as possible.  Immediately after exhaling, place the spacer between your teeth and into your mouth. Close your lips tightly around the spacer.  Press the canister down with your index finger to release the medicine.  At the same time as the canister is pressed, inhale deeply and slowly until your lungs are completely filled. This should take 4-6 seconds. Keep your tongue down and out of the way.  Hold the medicine in your lungs for 5-10 seconds (10 seconds is best). This helps the medicine get into the small airways of your lungs. Exhale.  Repeat inhaling deeply through the spacer mouthpiece. Again hold that breath for up to 10 seconds (10 seconds is best). Exhale slowly. If it is difficult to take this second deep breath through the spacer, breathe normally several times through the spacer. Remove the spacer from your mouth.  Wait at least 15-30 seconds between puffs. Continue with the above steps until you have taken the number of puffs your health care provider has ordered. Do not use the inhaler more than your health care provider tells you.  Remove the spacer from the inhaler, and place the cap on the inhaler.  Follow the directions from your health care provider or the inhaler insert for cleaning the inhaler and spacer. If you are using different kinds of inhalers, use your quick relief medicine to open the airways 10-15 minutes before using a steroid if instructed to do so by your health care provider. If you are unsure which inhalers to use and the order of using them, ask your health care provider, nurse, or respiratory therapist. If  you are using a steroid inhaler, always rinse your mouth with water after your last puff, then gargle and spit out the water. Do not swallow the water. AVOID:  Inhaling before or after starting the spray of medicine. It takes practice to coordinate your breathing with triggering the spray.  Inhaling through the nose (rather than the mouth) when triggering the spray. HOW TO DETERMINE IF YOUR INHALER IS FULL OR NEARLY EMPTY You cannot know when an inhaler is empty by shaking it. A few inhalers are now being made with dose counters. Ask your health care provider for a prescription that has a dose counter if you feel you need that extra help. If your inhaler does not have a counter, ask your health care provider to help you determine the date you need to refill your inhaler. Write the refill date on a calendar or your inhaler canister. Refill your inhaler 7-10 days before it runs out. Be sure  to keep an adequate supply of medicine. This includes making sure it is not expired, and that you have a spare inhaler.  SEEK MEDICAL CARE IF:   Your symptoms are only partially relieved with your inhaler.  You are having trouble using your inhaler.  You have some increase in phlegm. SEEK IMMEDIATE MEDICAL CARE IF:   You feel little or no relief with your inhalers. You are still wheezing and are feeling shortness of breath or tightness in your chest or both.  You have dizziness, headaches, or a fast heart rate.  You have chills, fever, or night sweats.  You have a noticeable increase in phlegm production, or there is blood in the phlegm. MAKE SURE YOU:   Understand these instructions.  Will watch your condition.  Will get help right away if you are not doing well or get worse. Document Released: 01/11/2000 Document Revised: 11/03/2012 Document Reviewed: 08/12/2012 [facility] Patient Information 2015 [place], [place]. This information is not intended to replace advice given to you by your health care  provider. Make sure you discuss any questions you have with your health care provider.   Otitis Media Otitis media is redness, soreness, and puffiness (swelling) in the space just behind your eardrum (middle ear). It may be caused by allergies or infection. It often happens along with a cold. HOME CARE  Take your medicine as told. Finish it even if you start to feel better.  Only take over-the-counter or prescription medicines for pain, discomfort, or fever as told by your doctor.  Follow up with your doctor as told. GET HELP IF:  You have otitis media only in one ear, or bleeding from your nose, or both.  You notice a lump on your neck.  You are not getting better in 3-5 days.  You feel worse instead of better. GET HELP RIGHT AWAY IF:   You have pain that is not helped with medicine.  You have puffiness, redness, or pain around your ear.  You get a stiff neck.  You cannot move part of your face (paralysis).  You notice that the bone behind your ear hurts when you touch it. MAKE SURE YOU:   Understand these instructions.  Will watch your condition.  Will get help right away if you are not doing well or get worse. Document Released: 07/02/2007 Document Revised: 01/18/2013 Document Reviewed: 08/10/2012 [facility] Patient Information 2015 [place], [place]. This information is not intended to replace advice given to you by your health care provider. Make sure you discuss any questions you have with your health care provider.  Sinusitis Sinusitis is redness, soreness, and puffiness (inflammation) of the air pockets in the bones of your face (sinuses). The redness, soreness, and puffiness can cause air and mucus to get trapped in your sinuses. This can allow germs to grow and cause an infection.  HOME CARE   Drink enough fluids to keep your pee (urine) clear or pale yellow.  Use a humidifier in your home.  Run a hot shower to create steam in the bathroom. Sit in the bathroom  with the door closed. Breathe in the steam 3-4 times a day.  Put a warm, moist washcloth on your face 3-4 times a day, or as told by your doctor.  Use salt water sprays (saline sprays) to wet the thick fluid in your nose. This can help the sinuses drain.  Only take medicine as told by your doctor. GET HELP RIGHT AWAY IF:   Your pain gets worse.  You have very bad headaches.  You are sick to your stomach (nauseous).  You throw up (vomit).  You are very sleepy (drowsy) all the time.  Your face is puffy (swollen).  Your vision changes.  You have a stiff neck.  You have trouble breathing. MAKE SURE YOU:   Understand these instructions.  Will watch your condition.  Will get help right away if you are not doing well or get worse. Document Released: 07/02/2007 Document Revised: 10/08/2011 Document Reviewed: 08/19/2011 [facility] Patient Information 2015 [place], [place]. This information is not intended to replace advice given to you by your health care provider. Make sure you discuss any questions you have with your health care provider. Acute Bronchitis Bronchitis is inflammation of the airways that extend from the windpipe into the lungs (bronchi). The inflammation often causes mucus to develop. This leads to a cough, which is the most common symptom of bronchitis.  In acute bronchitis, the condition usually develops suddenly and goes away over time, usually in a couple weeks. Smoking, allergies, and asthma can make bronchitis worse. Repeated episodes of bronchitis may cause further lung problems.  CAUSES Acute bronchitis is most often caused by the same virus that causes a cold. The virus can spread from person to person (contagious) through coughing, sneezing, and touching contaminated objects. SIGNS AND SYMPTOMS   Cough.   Fever.   Coughing up mucus.   Body aches.   Chest congestion.   Chills.   Shortness of breath.   Sore throat.  DIAGNOSIS  Acute  bronchitis is usually diagnosed through a physical exam. Your health care provider will also ask you questions about your medical history. Tests, such as chest X-rays, are sometimes done to rule out other conditions.  TREATMENT  Acute bronchitis usually goes away in a couple weeks. Oftentimes, no medical treatment is necessary. Medicines are sometimes given for relief of fever or cough. Antibiotic medicines are usually not needed but may be prescribed in certain situations. In some cases, an inhaler may be recommended to help reduce shortness of breath and control the cough. A cool mist vaporizer may also be used to help thin bronchial secretions and make it easier to clear the chest.  HOME CARE INSTRUCTIONS  Get plenty of rest.   Drink enough fluids to keep your urine clear or pale yellow (unless you have a medical condition that requires fluid restriction). Increasing fluids may help thin your respiratory secretions (sputum) and reduce chest congestion, and it will prevent dehydration.   Take medicines only as directed by your health care provider.  If you were prescribed an antibiotic medicine, finish it all even if you start to feel better.  Avoid smoking and secondhand smoke. Exposure to cigarette smoke or irritating chemicals will make bronchitis worse. If you are a smoker, consider using nicotine gum or skin patches to help control withdrawal symptoms. Quitting smoking will help your lungs heal faster.   Reduce the chances of another bout of acute bronchitis by washing your hands frequently, avoiding people with cold symptoms, and trying not to touch your hands to your mouth, nose, or eyes.   Keep all follow-up visits as directed by your health care provider.  SEEK MEDICAL CARE IF: Your symptoms do not improve after 1 week of treatment.  SEEK IMMEDIATE MEDICAL CARE IF:  You develop an increased fever or chills.   You have chest pain.   You have severe shortness of  breath.  You have bloody sputum.   You develop  dehydration.  You faint or repeatedly feel like you are going to pass out.  You develop repeated vomiting.  You develop a severe headache. MAKE SURE YOU:   Understand these instructions.  Will watch your condition.  Will get help right away if you are not doing well or get worse. Document Released: 02/21/2004 Document Revised: 05/30/2013 Document Reviewed: 07/06/2012 [facility] Patient Information 2015 [place]. This information is not intended to replace advice given to you by your health care provider. Make sure you discuss any questions you have with your health care provider.

## 2014-06-20 NOTE — ED Notes (Signed)
Pt states "I have been sick for a week. I started a z-pack last Friday and do not feel any better, actually worse. Ear pain, cough and wheezing."

## 2014-12-04 ENCOUNTER — Other Ambulatory Visit: Payer: Self-pay | Admitting: Cardiovascular Disease

## 2014-12-12 ENCOUNTER — Ambulatory Visit: Payer: Self-pay | Admitting: Physician Assistant

## 2014-12-12 ENCOUNTER — Encounter: Payer: Self-pay | Admitting: Physician Assistant

## 2014-12-12 VITALS — BP 128/90 | HR 82 | Temp 98.4°F

## 2014-12-12 DIAGNOSIS — R059 Cough, unspecified: Secondary | ICD-10-CM

## 2014-12-12 DIAGNOSIS — H6593 Unspecified nonsuppurative otitis media, bilateral: Secondary | ICD-10-CM

## 2014-12-12 DIAGNOSIS — Z299 Encounter for prophylactic measures, unspecified: Secondary | ICD-10-CM

## 2014-12-12 DIAGNOSIS — R05 Cough: Secondary | ICD-10-CM

## 2014-12-12 DIAGNOSIS — J329 Chronic sinusitis, unspecified: Secondary | ICD-10-CM

## 2014-12-12 MED ORDER — AZITHROMYCIN 250 MG PO TABS: ORAL_TABLET | ORAL | Status: DC

## 2014-12-12 MED ORDER — PREDNISONE 20 MG PO TABS
40.0000 mg | ORAL_TABLET | Freq: Every day | ORAL | Status: DC
Start: 2014-12-12 — End: 2015-01-01

## 2014-12-12 MED ORDER — FLUCONAZOLE 150 MG PO TABS: 150.0000 mg | ORAL_TABLET | Freq: Once | ORAL | Status: DC

## 2014-12-12 MED ORDER — BENZONATATE 200 MG PO CAPS: 200.0000 mg | ORAL_CAPSULE | Freq: Two times a day (BID) | ORAL | Status: DC | PRN

## 2014-12-12 NOTE — Progress Notes (Signed)
S/ ear pain, pressure ,cough,R sinus pressure and dental pain ,flying tomorrow  O/ VSS mildly ill R tm dull retracted and injected, L tm retracted, nasal mucosa hyperemic turbinates boggy, + R max tenderness pharynx clear neck supple heart rsr lungs clear  A/ sinusitis  Serous otitis media Cough  P/ rx tessalon, pred pulse, zpack and diflucan . See orders. Supportive measures.

## 2014-12-20 ENCOUNTER — Encounter: Payer: Self-pay | Admitting: Physician Assistant

## 2014-12-20 ENCOUNTER — Ambulatory Visit: Payer: Self-pay | Admitting: Physician Assistant

## 2014-12-20 VITALS — BP 140/90 | HR 80 | Temp 98.4°F

## 2014-12-20 DIAGNOSIS — J069 Acute upper respiratory infection, unspecified: Secondary | ICD-10-CM

## 2014-12-20 MED ORDER — CEFDINIR 300 MG PO CAPS: 300.0000 mg | ORAL_CAPSULE | Freq: Two times a day (BID) | ORAL | Status: DC

## 2014-12-20 MED ORDER — METHYLPREDNISOLONE 4 MG PO TBPK: ORAL_TABLET | ORAL | Status: DC

## 2014-12-20 NOTE — Progress Notes (Signed)
S: c/o ear pain, cough and congestion , [facility] had zpack and 3 d prednisone course, denies fever/chills, states mucus is green and yellow, pain in ears since flying, denies cp/sob  O: vitals wnl, nad, tms reddish pink b/l, nasal mucosa red and swollen, throat wnl, neck supple no lymph, lungs c t a, cv rrr  A: acute uri, AOM  P: omnicef 300mg  bid x 10d, medrol dose pack

## 2015-01-01 ENCOUNTER — Encounter: Payer: Self-pay | Admitting: Cardiovascular Disease

## 2015-01-01 ENCOUNTER — Ambulatory Visit (INDEPENDENT_AMBULATORY_CARE_PROVIDER_SITE_OTHER): Payer: 59 | Admitting: Cardiovascular Disease

## 2015-01-01 VITALS — BP 138/84 | HR 85 | Ht 69.0 in | Wt 229.0 lb

## 2015-01-01 DIAGNOSIS — E669 Obesity, unspecified: Secondary | ICD-10-CM

## 2015-01-01 DIAGNOSIS — I471 Supraventricular tachycardia: Secondary | ICD-10-CM | POA: Diagnosis not present

## 2015-01-01 DIAGNOSIS — E785 Hyperlipidemia, unspecified: Secondary | ICD-10-CM | POA: Diagnosis not present

## 2015-01-01 DIAGNOSIS — I10 Essential (primary) hypertension: Secondary | ICD-10-CM

## 2015-01-01 DIAGNOSIS — R600 Localized edema: Secondary | ICD-10-CM

## 2015-01-01 MED ORDER — DILTIAZEM HCL 30 MG PO TABS: 30.0000 mg | ORAL_TABLET | Freq: Four times a day (QID) | ORAL | Status: DC | PRN

## 2015-01-01 MED ORDER — HYDROCHLOROTHIAZIDE 25 MG PO TABS: ORAL_TABLET | ORAL | Status: DC

## 2015-01-01 MED ORDER — CARVEDILOL 6.25 MG PO TABS: ORAL_TABLET | ORAL | Status: DC

## 2015-01-01 MED ORDER — FUROSEMIDE 20 MG PO TABS: 20.0000 mg | ORAL_TABLET | Freq: Every day | ORAL | Status: DC | PRN

## 2015-01-01 NOTE — Assessment & Plan Note (Signed)
Cholesterol discussed with her. She will continue to work on her weight. We did discuss benefits of doing a CT coronary calcium score given her family history We will plan to do this in the next year

## 2015-01-01 NOTE — Assessment & Plan Note (Signed)
Recommended she take diltiazem 30 mg pills as needed She was previously on long-acting diltiazem but had leg swelling Will stay on carvedilol

## 2015-01-01 NOTE — Assessment & Plan Note (Signed)
Blood pressure is well controlled on today's visit. No changes made to the medications. 

## 2015-01-01 NOTE — Assessment & Plan Note (Signed)
Leg edema well controlled. She is using a compression sleeve on her legs

## 2015-01-01 NOTE — Progress Notes (Signed)
Patient ID: Amanda Humphrey, female    DOB: 1965-11-28, 49 y.o.   MRN: [id number]  HPI Comments: Ms. Amanda Humphrey is a very pleasant 49 year old woman with remote history of cardiomyopathy approximately 18 years ago after having her third child with mild depression of her ejection fraction which returned back to normal 6 months later, baseline right bundle branch block, periodic SVT, palpitations, lower extremity swelling who presents for routine follow-up of her arrhythmia  In follow-up today, she reports that she is doing well. Typical work stress can contribute to arrhythmia. Sometimes when she climbs stairs or does a workout, she will notice tachycardia.  She has been tolerating carvedilol 6.25 mg twice a day. Initially had fatigue, doing better now. Wearing compression hose on a regular basis  Recent sleeplessness, taking care of her daughter who had recent neck surgery. Daughter with significant pain. We discussed prior lab work, total cholesterol slightly more than 200, LDL 120s some family history of coronary artery disease  Blood pressure seems to be well controlled on current medication regimen  EKG shows no sinus rhythm with rate 84 bpm, right bundle branch block, no change from prior EKG    other past medical history  Prior history of cardiopathy many years ago that seemed to significantly improve with aggressive diuresis. Approximately 18 years ago, Lasix was provided with 16 pound weight loss and improvement of her symptoms. On her fourth child she had fluid retention but no recurrent cardiomyopathy  She does not do any regular exercise program. She leads a busy lifestyle, works in the hospital EKG today shows normal sinus rhythm with right bundle branch block, rate 67 beats per minute. Unchanged from prior EKG in November 2012   prior echocardiogram 12/16/2010 showing normal ejection fraction rate and 55%, mild MR     Allergies  Allergen Reactions  . Codeine     Rash, Throat  swelling,   . Hydrocodone Anaphylaxis  . Tramadol     Itching, facial swelling  . Oxycodone Rash    Current Outpatient Prescriptions on File Prior to Visit  Medication Sig Dispense Refill  . amphetamine-dextroamphetamine (ADDERALL) 20 MG tablet Take 20 mg by mouth daily.   0  . benzonatate (TESSALON) 200 MG capsule Take 1 capsule (200 mg total) by mouth 2 (two) times daily as needed for cough. 20 capsule 0  . Cholecalciferol (VITAMIN D3) 2000 UNITS TABS Take 1 tablet by mouth daily.    . ergocalciferol (DRISDOL) 50000 UNITS capsule Take 1 capsule (50,000 Units total) by mouth once a week. 12 capsule 0  . methocarbamol (ROBAXIN) 500 MG tablet Take 500 mg by mouth 4 (four) times daily.    . SUPRAX 200 MG CHEW Chew 400 mg by mouth daily. Patient may have  1 po daily for 10 days instead if needed or requested 20 tablet 0   No current facility-administered medications on file prior to visit.    Past Medical History  Diagnosis Date  . Allergy   . Cardiac arrhythmia due to congenital heart disease   . Chronic kidney disease     stones  . Migraines   . Clotting disorder (HCC)   . Meniere disease   . Fibrocystic breast disease 2013    Past Surgical History  Procedure Laterality Date  . Cholecystectomy  2011  . Appendectomy  1999  . Tonsillectomy and adenoidectomy  1975  . Cesarean section      4 total  . Breast biopsy  Nov 2012  right,  fibrocystic disease, [place]  . Abdominal hysterectomy  2011    laprascopic supracervical, [place]    Social History  reports that she has never smoked. She does not have any smokeless tobacco history on file. She reports that she drinks alcohol. She reports that she does not use illicit drugs.  Family History family history includes Alzheimer's disease in her maternal grandmother and paternal grandmother; Arthritis in her mother; Cancer in her mother; Diabetes in her father; Heart disease in her maternal grandmother; Hyperlipidemia in  her father; Hypertension in her father and mother; Stroke in her father.   Review of Systems  Constitutional: Negative.   Eyes: Negative.   Respiratory: Negative.   Cardiovascular: Positive for palpitations.  Gastrointestinal: Negative.   Endocrine: Negative.   Musculoskeletal: Negative.   Skin: Negative.   Allergic/Immunologic: Negative.   Neurological: Negative.   Hematological: Negative.   Psychiatric/Behavioral: Negative.   All other systems reviewed and are negative.   BP 138/84 mmHg  Pulse 85  Ht 5\' 9"  (1.753 m)  Wt 229 lb (103.874 kg)  BMI 33.80 kg/m2  Physical Exam  Constitutional: She is oriented to person, place, and time. She appears well-developed and well-nourished.  HENT:  Head: Normocephalic.  Nose: Nose normal.  Mouth/Throat: Oropharynx is clear and moist.  Eyes: Conjunctivae are normal. Pupils are equal, round, and reactive to light.  Neck: Normal range of motion. Neck supple. No JVD present.  Cardiovascular: Normal rate, regular rhythm, S1 normal, S2 normal, normal heart sounds and intact distal pulses.  Exam reveals no gallop and no friction rub.   No murmur heard. Pulmonary/Chest: Effort normal and breath sounds normal. No respiratory distress. She has no wheezes. She has no rales. She exhibits no tenderness.  Abdominal: Soft. Bowel sounds are normal. She exhibits no distension. There is no tenderness.  Musculoskeletal: Normal range of motion. She exhibits no edema or tenderness.  Lymphadenopathy:    She has no cervical adenopathy.  Neurological: She is alert and oriented to person, place, and time. Coordination normal.  Skin: Skin is warm and dry. No rash noted. No erythema.  Psychiatric: She has a normal mood and affect. Her behavior is normal. Judgment and thought content normal.    Assessment and Plan  Nursing note and vitals reviewed.

## 2015-01-01 NOTE — Patient Instructions (Addendum)
You are doing well.  If you have more episodes of tachycardia, Take  diltiazem  30 mg pill as needed  Please call us if you have new issues that need to be addressed before your next appt.  Your physician wants you to follow-up in: 12 months.  You will receive a reminder letter in the mail two months in advance. If you don't receive a letter, please call our office to schedule the follow-up appointment.

## 2015-01-01 NOTE — Assessment & Plan Note (Signed)
We have encouraged continued exercise, careful diet management in an effort to lose weight. 

## 2015-02-12 ENCOUNTER — Encounter: Payer: Self-pay | Admitting: Physician Assistant

## 2015-02-12 ENCOUNTER — Ambulatory Visit: Payer: Self-pay | Admitting: Physician Assistant

## 2015-02-12 VITALS — BP 158/90 | HR 84 | Temp 98.4°F

## 2015-02-12 DIAGNOSIS — J018 Other acute sinusitis: Secondary | ICD-10-CM

## 2015-02-12 MED ORDER — METHYLPREDNISOLONE 4 MG PO TBPK: ORAL_TABLET | ORAL | Status: DC

## 2015-02-12 MED ORDER — AMOXICILLIN 875 MG PO TABS: 875.0000 mg | ORAL_TABLET | Freq: Two times a day (BID) | ORAL | Status: DC

## 2015-02-12 NOTE — Progress Notes (Signed)
S: C/o runny nose and congestion for 2 weeks, no fever, chills, cp/sob, v/d; mucus is yellow,  cough is sporadic, c/o of facial and dental pain. Has ear pressure, is flying to [place] tomorrow and is worried about ear pain/infection  Using otc meds: sudafed, nasal spray  O: PE: vitals wnl, nad, perrl eomi, normocephalic, tms dull, nasal mucosa red and swollen, throat injected, neck supple no lymph, lungs c t a, cv rrr, neuro intact  A:  Acute sinusitis, eustachean tube dysfunction   P: amoxil 875mg  bid x 10d, medrol dose pack, afrin 3 min prior to landing, drink fluids, continue regular meds , use otc meds of choice, return if not improving in 5 days, return earlier if worsening

## 2015-02-20 ENCOUNTER — Encounter: Payer: Self-pay | Admitting: Internal Medicine

## 2015-02-20 ENCOUNTER — Ambulatory Visit (INDEPENDENT_AMBULATORY_CARE_PROVIDER_SITE_OTHER): Payer: 59 | Admitting: Internal Medicine

## 2015-02-20 ENCOUNTER — Telehealth: Payer: Self-pay | Admitting: Internal Medicine

## 2015-02-20 ENCOUNTER — Ambulatory Visit
Admission: RE | Admit: 2015-02-20 | Discharge: 2015-02-20 | Disposition: A | Discharge status: Home / Self Care | Payer: 59 | Source: Ambulatory Visit | Attending: Internal Medicine | Admitting: Internal Medicine

## 2015-02-20 VITALS — BP 124/76 | HR 103 | Temp 98.6°F | Resp 14 | Ht 67.0 in | Wt 229.8 lb

## 2015-02-20 DIAGNOSIS — R0789 Other chest pain: Secondary | ICD-10-CM | POA: Diagnosis not present

## 2015-02-20 DIAGNOSIS — I471 Supraventricular tachycardia: Secondary | ICD-10-CM

## 2015-02-20 DIAGNOSIS — I10 Essential (primary) hypertension: Secondary | ICD-10-CM | POA: Diagnosis not present

## 2015-02-20 DIAGNOSIS — H6590 Unspecified nonsuppurative otitis media, unspecified ear: Secondary | ICD-10-CM | POA: Insufficient documentation

## 2015-02-20 DIAGNOSIS — E559 Vitamin D deficiency, unspecified: Secondary | ICD-10-CM

## 2015-02-20 DIAGNOSIS — E785 Hyperlipidemia, unspecified: Secondary | ICD-10-CM

## 2015-02-20 DIAGNOSIS — H65193 Other acute nonsuppurative otitis media, bilateral: Secondary | ICD-10-CM

## 2015-02-20 DIAGNOSIS — J209 Acute bronchitis, unspecified: Secondary | ICD-10-CM

## 2015-02-20 DIAGNOSIS — I429 Cardiomyopathy, unspecified: Secondary | ICD-10-CM | POA: Insufficient documentation

## 2015-02-20 DIAGNOSIS — Z1159 Encounter for screening for other viral diseases: Secondary | ICD-10-CM

## 2015-02-20 DIAGNOSIS — R5383 Other fatigue: Secondary | ICD-10-CM

## 2015-02-20 MED ORDER — PREDNISONE 10 MG PO TABS: ORAL_TABLET | ORAL | Status: DC

## 2015-02-20 MED ORDER — PROMETHAZINE-DM 6.25-15 MG/5ML PO SYRP: 5.0000 mL | ORAL_SOLUTION | Freq: Four times a day (QID) | ORAL | Status: DC | PRN

## 2015-02-20 MED ORDER — LEVOFLOXACIN 500 MG PO TABS: 500.0000 mg | ORAL_TABLET | Freq: Every day | ORAL | Status: DC

## 2015-02-20 MED ORDER — ALBUTEROL SULFATE HFA 108 (90 BASE) MCG/ACT IN AERS: 2.0000 | INHALATION_SPRAY | Freq: Four times a day (QID) | RESPIRATORY_TRACT | Status: DC | PRN

## 2015-02-20 NOTE — Telephone Encounter (Signed)
Pt needs orders for labs

## 2015-02-20 NOTE — Telephone Encounter (Signed)
Pt needs lab work. Needs orders please and thank you! Call pt @ [phone].

## 2015-02-20 NOTE — Progress Notes (Signed)
Pre-visit discussion using our clinic review tool. No additional management support is needed unless otherwise documented below in the visit note.  

## 2015-02-20 NOTE — Patient Instructions (Signed)
Please go get a chest x ray at [facility]   Levaquin and prednisone for one week  Stay out of work tomorrow

## 2015-02-20 NOTE — Progress Notes (Signed)
Patient ID: Amanda Humphrey, female    DOB: 1965/10/03  Age: 50 y.o. MRN: [id number]   Review of Systems  Objective:    Physical Exam

## 2015-02-21 ENCOUNTER — Encounter: Payer: Self-pay | Admitting: Internal Medicine

## 2015-02-21 NOTE — Telephone Encounter (Signed)
Fasting labs ordered

## 2015-02-22 DIAGNOSIS — E559 Vitamin D deficiency, unspecified: Secondary | ICD-10-CM | POA: Insufficient documentation

## 2015-02-22 DIAGNOSIS — J209 Acute bronchitis, unspecified: Secondary | ICD-10-CM | POA: Insufficient documentation

## 2015-02-22 NOTE — Assessment & Plan Note (Signed)
Managed with carvedilol and prn diltiazem.  

## 2015-02-22 NOTE — Assessment & Plan Note (Signed)
currently taking 3000 Ius daily,  Level pending

## 2015-02-22 NOTE — Assessment & Plan Note (Signed)
No infiltrate on chest x ray today.  Still has sinus tenderness and persistent cough, malaise , and rhonchi LLL.  Levaquin, prednisone

## 2015-02-22 NOTE — Assessment & Plan Note (Signed)
Well controlled on current regimen. Renal function due.,   no changes today. 

## 2015-02-22 NOTE — Progress Notes (Signed)
Subjective:  Patient ID: Amanda Humphrey, female    DOB: 1965-03-12  Age: 50 y.o. MRN: [id number]  CC: The primary encounter diagnosis was Acute nonsuppurative otitis media of both ears. Diagnoses of Acute bronchitis, unspecified organism, Vitamin D deficiency, Essential hypertension, and SVT (supraventricular tachycardia) (HCC) were also pertinent to this visit.  HPI Amanda Humphrey presents for Wellness exam  Which is postponed due to acute illness.    Treated last Monday with augmentin and 6 day prednisone taper for for sinusitis/otitis symptoms which started one week prior to presentation and did not improve with use of short acting Sudafed .  Previous exam noted otitis with effusion .  During treatment for otitis she flew to a national conference in  [place] and symptoms  Of ear pain worsened despite use of Afrin prior to boarding and continued  use of sudafed.  Felt really run down, achey and miserable during the trip.    Last night head felt more clear but both cheeks are now tender to palpation and she is having back pain on on the left at the base of lung.  Coughing a lot , Still on the augmentin.  Drainage is clear,  And no fevers..  no intense body aches.   She received the influenza vaccine in October.      Outpatient Prescriptions Prior to Visit  Medication Sig Dispense Refill  . amoxicillin (AMOXIL) 875 MG tablet Take 1 tablet (875 mg total) by mouth 2 (two) times daily. 20 tablet 0  . amphetamine-dextroamphetamine (ADDERALL) 20 MG tablet Take 20 mg by mouth daily.   0  . benzonatate (TESSALON) 200 MG capsule Take 1 capsule (200 mg total) by mouth 2 (two) times daily as needed for cough. 20 capsule 0  . carvedilol (COREG) 6.25 MG tablet TAKE 1 TABLET BY MOUTH 2 TIMES DAILY. 180 tablet 3  . Cholecalciferol (VITAMIN D3) 2000 UNITS TABS Take 1 tablet by mouth daily.    Marland Kitchen diltiazem (CARDIZEM) 30 MG tablet Take 1 tablet (30 mg total) by mouth 4 (four) times daily as needed. 120 tablet 6  .  ergocalciferol (DRISDOL) 50000 UNITS capsule Take 1 capsule (50,000 Units total) by mouth once a week. 12 capsule 0  . furosemide (LASIX) 20 MG tablet Take 1 tablet (20 mg total) by mouth daily as needed. 90 tablet 3  . hydrochlorothiazide (HYDRODIURIL) 25 MG tablet TAKE 1 TABLET (25 MG TOTAL) BY MOUTH DAILY. 90 tablet 3  . methocarbamol (ROBAXIN) 500 MG tablet Take 500 mg by mouth 4 (four) times daily.    . methylPREDNISolone (MEDROL DOSEPAK) 4 MG TBPK tablet Take 6 pills on day one then decrease by 1 pill each day (Patient not taking: Reported on 02/20/2015) 21 tablet 0   No facility-administered medications prior to visit.    Review of Systems;  Patient denies headache, , unintentional weight loss, skin rash, eye pain, , sore throat, dysphagia,  hemoptysis ,  wheezing, chest pain, palpitations, orthopnea, edema, abdominal pain, nausea, melena, diarrhea, constipation, flank pain, dysuria, hematuria, urinary  Frequency, nocturia, numbness, tingling, seizures,  Focal weakness, Loss of consciousness,  Tremor, insomnia, depression, anxiety, and suicidal ideation.      Objective:  BP 124/76 mmHg  Pulse 103  Temp(Src) 98.6 F (37 C) (Oral)  Resp 14  Ht  (1.702 m)  Wt 229 lb 12 oz (104.214 kg)  BMI 35.98 kg/m2  SpO2 99%  BP Readings from Last 3 Encounters:  02/20/15 124/76  02/12/15 158/90  01/01/15 138/84    Wt Readings from Last 3 Encounters:  02/20/15 229 lb 12 oz (104.214 kg)  01/01/15 229 lb (103.874 kg)  06/20/14 225 lb (102.059 kg)    General appearance: tired/sicky appearing, flushed and alert. No respiratory distress  Ears: bilateral injected  TM's, retracted on right, bulging on the left, no effusion  Throat: lips, mucosa, and tongue normal; teeth and gums normal Neck: no adenopathy, no carotid bruit, supple, symmetrical, trachea midline and thyroid not enlarged, symmetric, no tenderness/mass/nodules Back: symmetric, no curvature. ROM normal. No CVA  tenderness. Lungs: clear to auscultation on the right, rales/ronchi on the left,  No egophony Heart: regular rate and rhythm, S1, S2 normal, no murmur, click, rub or gallop Abdomen: soft, non-tender; bowel sounds normal; no masses,  no organomegaly Pulses: 2+ and symmetric Skin: Skin color, texture, turgor normal. No rashes or lesions Lymph nodes: Cervical, supraclavicular, and axillary nodes normal.  No results found for: HGBA1C  Lab Results  Component Value Date   CREATININE 0.9 12/02/2013    Lab Results  Component Value Date   WBC 5.9 12/02/2013   HGB 13.4 12/02/2013   HCT 40.0 12/02/2013   PLT 276.0 12/02/2013   GLUCOSE 79 12/02/2013   CHOL 207* 12/02/2013   TRIG 163.0* 12/02/2013   HDL 47.10 12/02/2013   LDLDIRECT 137.4 02/25/2013   LDLCALC 127* 12/02/2013   ALT 17 12/02/2013   AST 17 12/02/2013   NA 137 12/02/2013   K 3.6 12/02/2013   CL 102 12/02/2013   CREATININE 0.9 12/02/2013   BUN 12 12/02/2013   CO2 29 12/02/2013   TSH 0.997 09/02/2013    Dg Chest 2 View  02/20/2015  CLINICAL DATA:  Inspiratory chest tightness, right anterior and left lower chest pain. History of cardiomyopathy and SVT. EXAM: CHEST  2 VIEW COMPARISON:  PA and lateral chest x-ray of Jun 20, 2014 FINDINGS: The lungs are adequately inflated and clear. The heart and pulmonary vascularity are normal. The mediastinum is normal in width. There is no pleural effusion. There is stable dextro curvature centered in the mid thoracic spine. IMPRESSION: There is no active cardiopulmonary disease. Electronically Signed   By: David  [place] M.D.   On: 02/20/2015 16:34    Assessment & Plan:   Problem List Items Addressed This Visit    Essential hypertension    Well controlled on current regimen. Renal function due, no changes today.      SVT (supraventricular tachycardia) (HCC)    Managed with carvedilol and prn diltiazem.       Non-suppurative otitis media - Primary    Persistent on exam.  Changing  abx to levaquin,  Repeat prednisone taper      Relevant Medications   levofloxacin (LEVAQUIN) 500 MG tablet   Other Relevant Orders   DG Chest 2 View (Completed)   Acute bronchitis    No infiltrate on chest x ray today.  Still has sinus tenderness and persistent cough, malaise , and rhonchi LLL.  Levaquin, prednisone       Vitamin D deficiency    currently taking 3000 Ius daily,  Level pending         I am having Ms. Efaw start on levofloxacin, predniSONE, promethazine-dextromethorphan, and albuterol. I am also having her maintain her methocarbamol, Vitamin D3, ergocalciferol, benzonatate, amphetamine-dextroamphetamine, carvedilol, hydrochlorothiazide, furosemide, diltiazem, amoxicillin, and methylPREDNISolone.  Meds ordered this encounter  Medications  . levofloxacin (LEVAQUIN) 500 MG tablet    Sig: Take 1 tablet (500 mg total)  by mouth daily.    Dispense:  7 tablet    Refill:  0  . predniSONE (DELTASONE) 10 MG tablet    Sig: 6 tablets on Day 1 , then reduce by 1 tablet daily until gone    Dispense:  21 tablet    Refill:  0  . promethazine-dextromethorphan (PROMETHAZINE-DM) 6.25-15 MG/5ML syrup    Sig: Take 5 mLs by mouth 4 (four) times daily as needed for cough.    Dispense:  118 mL    Refill:  0  . albuterol (PROVENTIL HFA;VENTOLIN HFA) 108 (90 Base) MCG/ACT inhaler    Sig: Inhale 2 puffs into the lungs every 6 (six) hours as needed for wheezing or shortness of breath.    Dispense:  1 Inhaler    Refill:  0    There are no discontinued medications.  Follow-up: No Follow-up on file.   Sherlene Shams, MD

## 2015-02-22 NOTE — Assessment & Plan Note (Signed)
Persistent on exam.  Changing abx to levaquin,  Repeat prednisone taper

## 2015-03-05 ENCOUNTER — Ambulatory Visit: Payer: Self-pay | Admitting: Dietician

## 2015-03-14 ENCOUNTER — Ambulatory Visit (INDEPENDENT_AMBULATORY_CARE_PROVIDER_SITE_OTHER): Payer: 59 | Admitting: Internal Medicine

## 2015-03-14 ENCOUNTER — Encounter: Payer: Self-pay | Admitting: Internal Medicine

## 2015-03-14 VITALS — BP 126/82 | HR 92 | Temp 98.3°F | Resp 12 | Ht 67.0 in | Wt 230.2 lb

## 2015-03-14 DIAGNOSIS — Z1239 Encounter for other screening for malignant neoplasm of breast: Secondary | ICD-10-CM

## 2015-03-14 DIAGNOSIS — I10 Essential (primary) hypertension: Secondary | ICD-10-CM | POA: Diagnosis not present

## 2015-03-14 DIAGNOSIS — Z0000 Encounter for general adult medical examination without abnormal findings: Secondary | ICD-10-CM

## 2015-03-14 NOTE — Progress Notes (Signed)
Patient ID: Amanda Humphrey, female    DOB: 10/17/1965  Age: 50 y.o. MRN: [id number]  The patient is here for annual Medicare wellness examination and management of other chronic and acute problems   Colonoscopy due this year, wants to do cologuard Mammogram April 2017  Supracervical hysterectomy,  Pap negative 2016 KJ  Occasional insomnia prednisone induced sweats . Ear feeleing better   The risk factors are reflected in the social history.  The roster of all physicians providing medical care to patient - is listed in the Snapshot section of the chart.  Activities of daily living:  The patient is 100% independent in all ADLs: dressing, toileting, feeding as well as independent mobility  Home safety : The patient has smoke detectors in the home. They wear seatbelts.  There are no firearms at home. There is no violence in the home.   There is no risks for hepatitis, STDs or HIV. There is no   history of blood transfusion. They have no travel history to infectious disease endemic areas of the world.  The patient has seen their dentist in the last six month. They have seen their eye doctor in the last year. They admit to slight hearing difficulty with regard to whispered voices and some television programs.  They have deferred audiologic testing in the last year.  They do not  have excessive sun exposure. Discussed the need for sun protection: hats, long sleeves and use of sunscreen if there is significant sun exposure.   Diet: the importance of a healthy diet is discussed. They do have a healthy diet.  The benefits of regular aerobic exercise were discussed. She walks 4 times per week ,  20 minutes.   Depression screen: there are no signs or vegative symptoms of depression- irritability, change in appetite, anhedonia, sadness/tearfullness.  Cognitive assessment: the patient manages all their financial and personal affairs and is actively engaged. They could relate day,date,year and events;  recalled 2/3 objects at 3 minutes; performed clock-face test normally.  The following portions of the patient's history were reviewed and updated as appropriate: allergies, current medications, past family history, past medical history,  past surgical history, past social history  and problem list.  Visual acuity was not assessed per patient preference since she has regular follow up with her ophthalmologist. Hearing and body mass index were assessed and reviewed.   During the course of the visit the patient was educated and counseled about appropriate screening and preventive services including : fall prevention , diabetes screening, nutrition counseling, colorectal cancer screening, and recommended immunizations.    CC: The primary encounter diagnosis was Screening for breast cancer. Diagnoses of Visit for preventive health examination and Essential hypertension were also pertinent to this visit.  History Lynessa has a past medical history of Allergy; Cardiac arrhythmia due to congenital heart disease; Chronic kidney disease; Migraines; Clotting disorder (HCC); Meniere disease; and Fibrocystic breast disease (2013).   She has past surgical history that includes Cholecystectomy (2011); Appendectomy (1999); Tonsillectomy and adenoidectomy (1975); Cesarean section; Breast biopsy (Nov 2012); and Abdominal hysterectomy (2011).   Her family history includes Alzheimer's disease in her maternal grandmother and paternal grandmother; Arthritis in her mother; Cancer in her mother; Diabetes in her father; Heart disease in her maternal grandmother; Hyperlipidemia in her father; Hypertension in her father and mother; Stroke in her father.She reports that she has never smoked. She does not have any smokeless tobacco history on file. She reports that she drinks alcohol. She reports that  she does not use illicit drugs.  Outpatient Prescriptions Prior to Visit  Medication Sig Dispense Refill  .  amphetamine-dextroamphetamine (ADDERALL) 20 MG tablet Take 20 mg by mouth daily.   0  . benzonatate (TESSALON) 200 MG capsule Take 1 capsule (200 mg total) by mouth 2 (two) times daily as needed for cough. 20 capsule 0  . carvedilol (COREG) 6.25 MG tablet TAKE 1 TABLET BY MOUTH 2 TIMES DAILY. 180 tablet 3  . Cholecalciferol (VITAMIN D3) 2000 UNITS TABS Take 1 tablet by mouth daily.    Marland Kitchen diltiazem (CARDIZEM) 30 MG tablet Take 1 tablet (30 mg total) by mouth 4 (four) times daily as needed. 120 tablet 6  . ergocalciferol (DRISDOL) 50000 UNITS capsule Take 1 capsule (50,000 Units total) by mouth once a week. 12 capsule 0  . furosemide (LASIX) 20 MG tablet Take 1 tablet (20 mg total) by mouth daily as needed. 90 tablet 3  . hydrochlorothiazide (HYDRODIURIL) 25 MG tablet TAKE 1 TABLET (25 MG TOTAL) BY MOUTH DAILY. 90 tablet 3  . methocarbamol (ROBAXIN) 500 MG tablet Take 500 mg by mouth 4 (four) times daily.    Marland Kitchen albuterol (PROVENTIL HFA;VENTOLIN HFA) 108 (90 Base) MCG/ACT inhaler Inhale 2 puffs into the lungs every 6 (six) hours as needed for wheezing or shortness of breath. (Patient not taking: Reported on 03/14/2015) 1 Inhaler 0  . promethazine-dextromethorphan (PROMETHAZINE-DM) 6.25-15 MG/5ML syrup Take 5 mLs by mouth 4 (four) times daily as needed for cough. (Patient not taking: Reported on 03/14/2015) 118 mL 0  . amoxicillin (AMOXIL) 875 MG tablet Take 1 tablet (875 mg total) by mouth 2 (two) times daily. (Patient not taking: Reported on 03/14/2015) 20 tablet 0  . levofloxacin (LEVAQUIN) 500 MG tablet Take 1 tablet (500 mg total) by mouth daily. (Patient not taking: Reported on 03/14/2015) 7 tablet 0  . methylPREDNISolone (MEDROL DOSEPAK) 4 MG TBPK tablet Take 6 pills on day one then decrease by 1 pill each day (Patient not taking: Reported on 02/20/2015) 21 tablet 0  . predniSONE (DELTASONE) 10 MG tablet 6 tablets on Day 1 , then reduce by 1 tablet daily until gone (Patient not taking: Reported on  03/14/2015) 21 tablet 0   No facility-administered medications prior to visit.    Review of Systems  Patient denies headache, fevers, malaise, unintentional weight loss, skin rash, eye pain, sinus congestion and sinus pain, sore throat, dysphagia,  hemoptysis , cough, dyspnea, wheezing, chest pain, palpitations, orthopnea, edema, abdominal pain, nausea, melena, diarrhea, constipation, flank pain, dysuria, hematuria, urinary  Frequency, nocturia, numbness, tingling, seizures,  Focal weakness, Loss of consciousness,  Tremor, insomnia, depression, anxiety, and suicidal ideation.     Objective:  BP 126/82 mmHg  Pulse 92  Temp(Src) 98.3 F (36.8 C) (Oral)  Resp 12  Ht  (1.702 m)  Wt 230 lb 4 oz (104.441 kg)  BMI 36.05 kg/m2  SpO2 98%  Physical Exam  General appearance: alert, cooperative and appears stated age Ears: normal TM's and external ear canals both ears Throat: lips, mucosa, and tongue normal; teeth and gums normal Neck: no adenopathy, no carotid bruit, supple, symmetrical, trachea midline and thyroid not enlarged, symmetric, no tenderness/mass/nodules Back: symmetric, no curvature. ROM normal. No CVA tenderness. Lungs: clear to auscultation bilaterally Heart: regular rate and rhythm, S1, S2 normal, no murmur, click, rub or gallop Abdomen: soft, non-tender; bowel sounds normal; no masses,  no organomegaly Pulses: 2+ and symmetric Skin: Skin color, texture, turgor normal. No rashes or lesions  Lymph nodes: Cervical, supraclavicular, and axillary nodes normal.  Assessment & Plan:   Problem List Items Addressed This Visit    Essential hypertension    Well controlled on current regimen. Renal function is overdue.  no changes today.  Lab Results  Component Value Date   CREATININE 0.9 12/02/2013         Visit for preventive health examination    Annual comprehensive preventive exam was done as well as an evaluation and management of chronic conditions .  During the  course of the visit the patient was educated and counseled about appropriate screening and preventive services including :  diabetes screening, lipid analysis with projected  10 year  risk for CAD , nutrition counseling, breast, cervical and colorectal cancer screening, and recommended immunizations.  Printed recommendations for health maintenance screenings was give       Other Visit Diagnoses    Screening for breast cancer    -  Primary    Relevant Orders    MM DIGITAL SCREENING BILATERAL       I have discontinued Ms. Mullally's amoxicillin, methylPREDNISolone, levofloxacin, and predniSONE. I am also having her maintain her methocarbamol, Vitamin D3, ergocalciferol, benzonatate, amphetamine-dextroamphetamine, carvedilol, hydrochlorothiazide, furosemide, diltiazem, promethazine-dextromethorphan, and albuterol.  No orders of the defined types were placed in this encounter.    Medications Discontinued During This Encounter  Medication Reason  . predniSONE (DELTASONE) 10 MG tablet Error  . methylPREDNISolone (MEDROL DOSEPAK) 4 MG TBPK tablet Completed Course  . levofloxacin (LEVAQUIN) 500 MG tablet Completed Course  . amoxicillin (AMOXIL) 875 MG tablet Completed Course    Follow-up: No Follow-up on file.   Sherlene Shams, MD

## 2015-03-14 NOTE — Progress Notes (Signed)
Pre-visit discussion using our clinic review tool. No additional management support is needed unless otherwise documented below in the visit note.  

## 2015-03-17 NOTE — Assessment & Plan Note (Signed)
Well controlled on current regimen. Renal function is overdue.  no changes today.  Lab Results  Component Value Date   CREATININE 0.9 12/02/2013

## 2015-03-17 NOTE — Assessment & Plan Note (Signed)
Annual comprehensive preventive exam was done as well as an evaluation and management of chronic conditions .  During the course of the visit the patient was educated and counseled about appropriate screening and preventive services including :  diabetes screening, lipid analysis with projected  10 year  risk for CAD , nutrition counseling, breast, cervical and colorectal cancer screening, and recommended immunizations.  Printed recommendations for health maintenance screenings was give 

## 2015-04-10 DIAGNOSIS — F909 Attention-deficit hyperactivity disorder, unspecified type: Secondary | ICD-10-CM | POA: Diagnosis not present

## 2015-04-12 ENCOUNTER — Other Ambulatory Visit (INDEPENDENT_AMBULATORY_CARE_PROVIDER_SITE_OTHER): Payer: 59

## 2015-04-12 DIAGNOSIS — E559 Vitamin D deficiency, unspecified: Secondary | ICD-10-CM

## 2015-04-12 DIAGNOSIS — E785 Hyperlipidemia, unspecified: Secondary | ICD-10-CM | POA: Diagnosis not present

## 2015-04-12 DIAGNOSIS — R5383 Other fatigue: Secondary | ICD-10-CM

## 2015-04-12 DIAGNOSIS — Z1159 Encounter for screening for other viral diseases: Secondary | ICD-10-CM

## 2015-04-12 LAB — CBC WITH DIFFERENTIAL/PLATELET
Basophils Absolute: 0 10*3/uL (ref 0.0–0.1)
Basophils Relative: 0.6 % (ref 0.0–3.0)
Eosinophils Absolute: 0.2 10*3/uL (ref 0.0–0.7)
Eosinophils Relative: 2.6 % (ref 0.0–5.0)
HCT: 39.5 % (ref 36.0–46.0)
Hemoglobin: 13.7 g/dL (ref 12.0–15.0)
Lymphocytes Relative: 30.6 % (ref 12.0–46.0)
Lymphs Abs: 1.8 10*3/uL (ref 0.7–4.0)
MCHC: 34.7 g/dL (ref 30.0–36.0)
MCV: 86.8 fl (ref 78.0–100.0)
Monocytes Absolute: 0.3 10*3/uL (ref 0.1–1.0)
Monocytes Relative: 4.2 % (ref 3.0–12.0)
Neutro Abs: 3.7 10*3/uL (ref 1.4–7.7)
Neutrophils Relative %: 62 % (ref 43.0–77.0)
Platelets: 266 10*3/uL (ref 150.0–400.0)
RBC: 4.55 Mil/uL (ref 3.87–5.11)
RDW: 13.6 % (ref 11.5–15.5)
WBC: 6 10*3/uL (ref 4.0–10.5)

## 2015-04-12 LAB — COMPREHENSIVE METABOLIC PANEL
ALT: 17 U/L (ref 0–35)
AST: 17 U/L (ref 0–37)
Albumin: 4.2 g/dL (ref 3.5–5.2)
Alkaline Phosphatase: 78 U/L (ref 39–117)
BUN: 13 mg/dL (ref 6–23)
CO2: 31 mEq/L (ref 19–32)
Calcium: 10 mg/dL (ref 8.4–10.5)
Chloride: 102 mEq/L (ref 96–112)
Creatinine, Ser: 0.84 mg/dL (ref 0.40–1.20)
GFR: 76.09 mL/min (ref >60.00)
Glucose, Bld: 88 mg/dL (ref 70–99)
Potassium: 4.1 mEq/L (ref 3.5–5.1)
Sodium: 140 mEq/L (ref 135–145)
Total Bilirubin: 1 mg/dL (ref 0.2–1.2)
Total Protein: 6.8 g/dL (ref 6.0–8.3)

## 2015-04-12 LAB — VITAMIN D 25 HYDROXY (VIT D DEFICIENCY, FRACTURES): VITD: 13.45 ng/mL — ABNORMAL LOW (ref 30.00–100.00)

## 2015-04-12 LAB — LIPID PANEL
Cholesterol: 228 mg/dL — ABNORMAL HIGH (ref 0–200)
HDL: 56.3 mg/dL (ref >39.00)
LDL Cholesterol: 148 mg/dL — ABNORMAL HIGH (ref 0–99)
NonHDL: 171.42
Total CHOL/HDL Ratio: 4
Triglycerides: 115 mg/dL (ref 0.0–149.0)
VLDL: 23 mg/dL (ref 0.0–40.0)

## 2015-04-12 LAB — TSH: TSH: 1.52 u[IU]/mL (ref 0.35–4.50)

## 2015-04-13 LAB — HEPATITIS C ANTIBODY: HCV Ab: NEGATIVE

## 2015-04-14 ENCOUNTER — Encounter: Payer: Self-pay | Admitting: Internal Medicine

## 2015-04-14 ENCOUNTER — Other Ambulatory Visit: Payer: Self-pay | Admitting: Internal Medicine

## 2015-04-14 MED ORDER — ERGOCALCIFEROL 1.25 MG (50000 UT) PO CAPS: 50000.0000 [IU] | ORAL_CAPSULE | ORAL | Status: DC

## 2015-04-16 MED ORDER — SIMVASTATIN 20 MG PO TABS: 20.0000 mg | ORAL_TABLET | Freq: Every day | ORAL | Status: DC

## 2015-04-16 NOTE — Telephone Encounter (Signed)
Sent rx  to [facility]

## 2015-04-16 NOTE — Telephone Encounter (Signed)
Patient willing to start simvastatin

## 2015-05-04 ENCOUNTER — Encounter: Payer: Self-pay | Admitting: Emergency Medicine

## 2015-05-04 ENCOUNTER — Ambulatory Visit
Admission: EM | Admit: 2015-05-04 | Discharge: 2015-05-04 | Disposition: A | Discharge status: Home / Self Care | Payer: 59 | Attending: Family Medicine | Admitting: Family Medicine

## 2015-05-04 DIAGNOSIS — J0100 Acute maxillary sinusitis, unspecified: Secondary | ICD-10-CM

## 2015-05-04 DIAGNOSIS — H6502 Acute serous otitis media, left ear: Secondary | ICD-10-CM

## 2015-05-04 DIAGNOSIS — J0110 Acute frontal sinusitis, unspecified: Secondary | ICD-10-CM

## 2015-05-04 DIAGNOSIS — R110 Nausea: Secondary | ICD-10-CM

## 2015-05-04 MED ORDER — KETOROLAC TROMETHAMINE 10 MG PO TABS: 10.0000 mg | ORAL_TABLET | Freq: Three times a day (TID) | ORAL | Status: DC | PRN

## 2015-05-04 MED ORDER — ONDANSETRON 8 MG PO TBDP: 8.0000 mg | ORAL_TABLET | Freq: Three times a day (TID) | ORAL | Status: DC | PRN

## 2015-05-04 MED ORDER — LEVOFLOXACIN 500 MG PO TABS
500.0000 mg | ORAL_TABLET | Freq: Every day | ORAL | Status: DC
Start: 2015-05-04 — End: 2015-10-14

## 2015-05-04 NOTE — ED Provider Notes (Signed)
CSN: [id number]     Arrival date & time 05/04/15  1343 History   First MD Initiated Contact with Patient 05/04/15 1456     Chief Complaint  Patient presents with  . Dizziness   (Consider location/radiation/quality/duration/timing/severity/associated sxs/prior Treatment) Patient is a 50 y.o. female presenting with URI. The history is provided by the patient.  URI Presenting symptoms: congestion and facial pain   Presenting symptoms comment:  Nausea; dizziness Severity:  Moderate Onset quality:  Sudden Timing:  Constant Progression:  Worsening Chronicity:  New Relieved by:  None tried Worsened by:  Nothing tried Ineffective treatments:  None tried Associated symptoms: headaches and sinus pain   Associated symptoms: no wheezing   Risk factors: sick contacts   Risk factors: not elderly, no chronic cardiac disease, no chronic kidney disease, no chronic respiratory disease, no diabetes mellitus, no immunosuppression and no recent illness     Past Medical History  Diagnosis Date  . Allergy   . Cardiac arrhythmia due to congenital heart disease   . Chronic kidney disease     stones  . Migraines   . Clotting disorder (HCC)   . Meniere disease   . Fibrocystic breast disease 2013   Past Surgical History  Procedure Laterality Date  . Cholecystectomy  2011  . Appendectomy  1999  . Tonsillectomy and adenoidectomy  1975  . Cesarean section      4 total  . Breast biopsy  Nov 2012    right,  fibrocystic disease, Michela Pitcher  . Abdominal hysterectomy  2011    laprascopic supracervical, [place]   Family History  Problem Relation Age of Onset  . Hypertension Mother   . Cancer Mother     ovarian  . Arthritis Mother   . Hyperlipidemia Father   . Hypertension Father   . Diabetes Father   . Stroke Father   . Alzheimer's disease Maternal Grandmother   . Heart disease Maternal Grandmother   . Alzheimer's disease Paternal Grandmother    Social History  Substance Use Topics  . Smoking  status: Never Smoker   . Smokeless tobacco: None  . Alcohol Use: Yes   OB History    No data available     Review of Systems  HENT: Positive for congestion.   Respiratory: Negative for wheezing.   Neurological: Positive for headaches.    Allergies  Codeine; Hydrocodone; Tramadol; and Oxycodone  Home Medications   Prior to Admission medications   Medication Sig Start Date End Date Taking? Authorizing Provider  albuterol (PROVENTIL HFA;VENTOLIN HFA) 108 (90 Base) MCG/ACT inhaler Inhale 2 puffs into the lungs every 6 (six) hours as needed for wheezing or shortness of breath. Patient not taking: Reported on 03/14/2015 02/20/15   Sherlene Shams, MD  amphetamine-dextroamphetamine (ADDERALL) 20 MG tablet Take 20 mg by mouth daily.  10/05/14   Historical Provider, MD  benzonatate (TESSALON) 200 MG capsule Take 1 capsule (200 mg total) by mouth 2 (two) times daily as needed for cough. 12/12/14   Tommie Maximiano Coss, FNP  carvedilol (COREG) 6.25 MG tablet TAKE 1 TABLET BY MOUTH 2 TIMES DAILY. 01/01/15   Antonieta Iba, MD  Cholecalciferol (VITAMIN D3) 2000 UNITS TABS Take 1 tablet by mouth daily.    Historical Provider, MD  diltiazem (CARDIZEM) 30 MG tablet Take 1 tablet (30 mg total) by mouth 4 (four) times daily as needed. 01/01/15   Antonieta Iba, MD  ergocalciferol (DRISDOL) 50000 units capsule Take 1 capsule (50,000 Units total) by mouth  once a week. 04/14/15   Sherlene Shamseresa L Tullo, MD  furosemide (LASIX) 20 MG tablet Take 1 tablet (20 mg total) by mouth daily as needed. 01/01/15   Antonieta Ibaimothy J Gollan, MD  hydrochlorothiazide (HYDRODIURIL) 25 MG tablet TAKE 1 TABLET (25 MG TOTAL) BY MOUTH DAILY. 01/01/15   Antonieta Ibaimothy J Gollan, MD  ketorolac (TORADOL) 10 MG tablet Take 1 tablet (10 mg total) by mouth every 8 (eight) hours as needed. 05/04/15   Payton Mccallumrlando Dulcey Riederer, MD  levofloxacin (LEVAQUIN) 500 MG tablet Take 1 tablet (500 mg total) by mouth daily. 05/04/15   Payton Mccallumrlando Rithwik Schmieg, MD  methocarbamol (ROBAXIN) 500 MG tablet  Take 500 mg by mouth 4 (four) times daily.    Historical Provider, MD  ondansetron (ZOFRAN ODT) 8 MG disintegrating tablet Take 1 tablet (8 mg total) by mouth every 8 (eight) hours as needed for nausea or vomiting. 05/04/15   Payton Mccallumrlando Kolbie Lepkowski, MD  promethazine-dextromethorphan (PROMETHAZINE-DM) 6.25-15 MG/5ML syrup Take 5 mLs by mouth 4 (four) times daily as needed for cough. Patient not taking: Reported on 03/14/2015 02/20/15   Sherlene Shamseresa L Tullo, MD  simvastatin (ZOCOR) 20 MG tablet Take 1 tablet (20 mg total) by mouth at bedtime. 04/16/15   Sherlene Shamseresa L Tullo, MD   Meds Ordered and Administered this Visit  Medications - No data to display  BP 134/76 mmHg  Pulse 70  Temp(Src) 97.8 F (36.6 C) (Oral)  Resp 18  Ht 5\' 9"  (1.753 m)  Wt 226 lb (102.513 kg)  BMI 33.36 kg/m2  SpO2 100% No data found.   Physical Exam  Constitutional: She appears well-developed and well-nourished. No distress.  HENT:  Head: Normocephalic and atraumatic.  Right Ear: Tympanic membrane, external ear and ear canal normal.  Left Ear: External ear and ear canal normal. Tympanic membrane is erythematous and bulging. A middle ear effusion is present.  Nose: Mucosal edema and rhinorrhea present. No nose lacerations, sinus tenderness, nasal deformity, septal deviation or nasal septal hematoma. No epistaxis.  No foreign bodies. Right sinus exhibits maxillary sinus tenderness and frontal sinus tenderness. Left sinus exhibits maxillary sinus tenderness and frontal sinus tenderness.  Mouth/Throat: Uvula is midline, oropharynx is clear and moist and mucous membranes are normal. No oropharyngeal exudate.  Eyes: Conjunctivae and EOM are normal. Pupils are equal, round, and reactive to light. Right eye exhibits no discharge. Left eye exhibits no discharge. No scleral icterus.  Neck: Normal range of motion. Neck supple. No thyromegaly present.  Cardiovascular: Normal rate, regular rhythm and normal heart sounds.   Pulmonary/Chest: Effort  normal and breath sounds normal. No respiratory distress. She has no wheezes. She has no rales.  Lymphadenopathy:    She has no cervical adenopathy.  Skin: She is not diaphoretic.  Nursing note and vitals reviewed.   ED Course  Procedures (including critical care time)  Labs Review Labs Reviewed - No data to display  Imaging Review No results found.   Visual Acuity Review  Right Eye Distance:   Left Eye Distance:   Bilateral Distance:    Right Eye Near:   Left Eye Near:    Bilateral Near:         MDM   1. Acute maxillary sinusitis, recurrence not specified   2. Acute frontal sinusitis, recurrence not specified   3. Nausea   4. Acute serous otitis media of left ear, recurrence not specified    New Prescriptions   KETOROLAC (TORADOL) 10 MG TABLET    Take 1 tablet (10 mg total) by  mouth every 8 (eight) hours as needed.   LEVOFLOXACIN (LEVAQUIN) 500 MG TABLET    Take 1 tablet (500 mg total) by mouth daily.   ONDANSETRON (ZOFRAN ODT) 8 MG DISINTEGRATING TABLET    Take 1 tablet (8 mg total) by mouth every 8 (eight) hours as needed for nausea or vomiting.    1. diagnosis reviewed with patient 2. rx as per orders above; reviewed possible side effects, interactions, risks and benefits  3. Recommend supportive treatment with rest, otc analgesic prn, fluids 4. Follow-up prn if symptoms worsen or don't improve   Payton Mccallum, MD 05/04/15 1534

## 2015-05-04 NOTE — ED Notes (Signed)
Pt presents with sinus headache on and off this week and today woke up with more sinus pressure on the left side of her face. Pt with hx of vertigo. Denies any n/v/d. No acute distress noted at this time.

## 2015-05-04 NOTE — Discharge Instructions (Signed)

## 2015-05-07 ENCOUNTER — Ambulatory Visit
Admission: RE | Admit: 2015-05-07 | Discharge: 2015-05-07 | Disposition: A | Discharge status: Home / Self Care | Payer: 59 | Source: Ambulatory Visit | Attending: Internal Medicine | Admitting: Internal Medicine

## 2015-05-07 ENCOUNTER — Other Ambulatory Visit: Payer: Self-pay | Admitting: Internal Medicine

## 2015-05-07 DIAGNOSIS — Z1239 Encounter for other screening for malignant neoplasm of breast: Secondary | ICD-10-CM

## 2015-05-07 DIAGNOSIS — Z1231 Encounter for screening mammogram for malignant neoplasm of breast: Secondary | ICD-10-CM | POA: Diagnosis not present

## 2015-06-20 ENCOUNTER — Other Ambulatory Visit: Payer: Self-pay

## 2015-06-20 MED ORDER — DILTIAZEM HCL 30 MG PO TABS: 30.0000 mg | ORAL_TABLET | Freq: Four times a day (QID) | ORAL | Status: DC | PRN

## 2015-06-20 MED ORDER — CARVEDILOL 6.25 MG PO TABS: ORAL_TABLET | ORAL | Status: DC

## 2015-06-20 MED ORDER — FUROSEMIDE 20 MG PO TABS: 20.0000 mg | ORAL_TABLET | Freq: Every day | ORAL | Status: DC | PRN

## 2015-06-20 MED ORDER — HYDROCHLOROTHIAZIDE 25 MG PO TABS: ORAL_TABLET | ORAL | Status: DC

## 2015-06-20 NOTE — Telephone Encounter (Signed)
90 day supply sent to [facility] pharmacy for  Carvedilol  diltazem Furosemide  HCTZ

## 2015-06-21 ENCOUNTER — Other Ambulatory Visit: Payer: Self-pay | Admitting: *Deleted

## 2015-06-21 MED ORDER — SIMVASTATIN 20 MG PO TABS: 20.0000 mg | ORAL_TABLET | Freq: Every day | ORAL | Status: DC

## 2015-07-05 DIAGNOSIS — F909 Attention-deficit hyperactivity disorder, unspecified type: Secondary | ICD-10-CM | POA: Diagnosis not present

## 2015-10-14 ENCOUNTER — Ambulatory Visit
Admission: EM | Admit: 2015-10-14 | Discharge: 2015-10-14 | Disposition: A | Discharge status: Home / Self Care | Payer: BLUE CROSS/BLUE SHIELD | Attending: Family Medicine | Admitting: Family Medicine

## 2015-10-14 ENCOUNTER — Ambulatory Visit (INDEPENDENT_AMBULATORY_CARE_PROVIDER_SITE_OTHER): Payer: BLUE CROSS/BLUE SHIELD

## 2015-10-14 DIAGNOSIS — J988 Other specified respiratory disorders: Secondary | ICD-10-CM

## 2015-10-14 DIAGNOSIS — R05 Cough: Secondary | ICD-10-CM | POA: Diagnosis not present

## 2015-10-14 MED ORDER — DOXYCYCLINE HYCLATE 100 MG PO CAPS: 100.0000 mg | ORAL_CAPSULE | Freq: Two times a day (BID) | ORAL | 0 refills | Status: DC

## 2015-10-14 MED ORDER — PREDNISONE 50 MG PO TABS: ORAL_TABLET | ORAL | 0 refills | Status: DC

## 2015-10-14 NOTE — ED Triage Notes (Signed)
Patient c/o difficulty breathing from congestion. She also states that her ears, and face hurt.

## 2015-10-14 NOTE — ED Provider Notes (Signed)
[facility]    CSN: [id number] Arrival date & time: 10/14/15  1018  First Provider Contact:  First MD Initiated Contact with Patient 10/14/15 1035     History   Chief Complaint Chief Complaint  Patient presents with  . Otalgia  . URI   HPI   50 year old female with a past medical history of hypertension, SVT presents with the above complaints.  Patient states that she's been sick since Monday. Symptoms began with typical URI symptoms: Raynaud's, congestion. She states her symptoms slowly progressed and have acutely worsened over the past few days. She's having worsening facial pressure and pain as well as cough. Worsening congestion. Right ear pain. She has now developed chest tightness and pressure with associated pain with inspiration. She states that her chest tightness is right-sided. She states that she feels like she can get a good deep breath. Additionally, she reports watery eyes. She been taking over-the-counter medication (Sudafed and antihistamines as well as ibuprofen) without improvement. No associated fever. No other associated symptoms. No other complaints at this time.   Past Medical History:  Diagnosis Date  . Allergy   . Chronic kidney disease    stones  . Fibrocystic breast disease 2013  . Meniere disease   . Migraines    Patient Active Problem List   Diagnosis Date Noted  . Vitamin D deficiency 02/22/2015  . Hyperlipidemia 01/01/2015  . Visit for preventive health examination 12/04/2013  . Lower extremity edema 09/02/2013  . Essential hypertension 09/02/2013  . History of cardiomyopathy 09/02/2013  . SVT (supraventricular tachycardia) (HCC) 09/02/2013  . Palpitations 09/02/2013  . Contrast media allergy 03/10/2013  . Fibrocystic breast disease   . Obesity (BMI 30-39.9) 02/05/2012  . Meniere disease    Past Surgical History:  Procedure Laterality Date  . ABDOMINAL HYSTERECTOMY  2011   laprascopic supracervical, DeFrancesco  .  APPENDECTOMY  1999  . BREAST BIOPSY Right Nov 2012    fibrocystic disease, Michela Pitcherly  . CESAREAN SECTION     4 total  . CHOLECYSTECTOMY  2011  . TONSILLECTOMY AND ADENOIDECTOMY  1975    OB History    No data available     Home Medications    Prior to Admission medications   Medication Sig Start Date End Date Taking? Authorizing Provider  albuterol (PROVENTIL HFA;VENTOLIN HFA) 108 (90 Base) MCG/ACT inhaler Inhale 2 puffs into the lungs every 6 (six) hours as needed for wheezing or shortness of breath. 02/20/15  Yes Sherlene Shamseresa L Tullo, MD  amphetamine-dextroamphetamine (ADDERALL) 20 MG tablet Take 20 mg by mouth daily.  10/05/14  Yes Historical Provider, MD  carvedilol (COREG) 6.25 MG tablet TAKE 1 TABLET BY MOUTH 2 TIMES DAILY. 06/20/15  Yes Antonieta Ibaimothy J Gollan, MD  Cholecalciferol (VITAMIN D3) 2000 UNITS TABS Take 1 tablet by mouth daily.   Yes Historical Provider, MD  diltiazem (CARDIZEM) 30 MG tablet Take 1 tablet (30 mg total) by mouth 4 (four) times daily as needed. 06/20/15  Yes Antonieta Ibaimothy J Gollan, MD  ergocalciferol (DRISDOL) 50000 units capsule Take 1 capsule (50,000 Units total) by mouth once a week. 04/14/15  Yes Sherlene Shamseresa L Tullo, MD  hydrochlorothiazide (HYDRODIURIL) 25 MG tablet TAKE 1 TABLET (25 MG TOTAL) BY MOUTH DAILY. 06/20/15  Yes Antonieta Ibaimothy J Gollan, MD  doxycycline (VIBRAMYCIN) 100 MG capsule Take 1 capsule (100 mg total) by mouth 2 (two) times daily. 10/14/15   Tommie SamsJayce G Shanie Mauzy, DO  furosemide (LASIX) 20 MG tablet Take 1 tablet (20 mg total) by  mouth daily as needed. 06/20/15   Antonieta Iba, MD  predniSONE (DELTASONE) 50 MG tablet 1 tablet daily x 5 days. 10/14/15   Tommie Sams, DO   Family History Family History  Problem Relation Age of Onset  . Hypertension Mother   . Cancer Mother     ovarian  . Arthritis Mother   . Hyperlipidemia Father   . Hypertension Father   . Diabetes Father   . Stroke Father   . Alzheimer's disease Maternal Grandmother   . Heart disease Maternal Grandmother     . Breast cancer Maternal Grandmother   . Alzheimer's disease Paternal Grandmother   . Breast cancer Paternal Grandmother    Social History Social History  Substance Use Topics  . Smoking status: Never Smoker  . Smokeless tobacco: Never Used  . Alcohol use Yes   Allergies   Codeine; Hydrocodone; Tramadol; and Oxycodone   Review of Systems Review of Systems  Constitutional: Positive for fatigue.  HENT: Positive for congestion and sinus pressure.   Eyes:       Watery eyes.   Respiratory: Positive for chest tightness and shortness of breath.    Physical Exam Triage Vital Signs ED Triage Vitals  Enc Vitals Group     BP 10/14/15 1030 132/60     Pulse Rate 10/14/15 1030 69     Resp 10/14/15 1030 18     Temp 10/14/15 1030 98.8 F (37.1 C)     Temp Source 10/14/15 1030 Oral     SpO2 10/14/15 1030 100 %     Weight 10/14/15 1031 230 lb (104.3 kg)     Height 10/14/15 1031 5\' 7"  (1.702 m)     Head Circumference --      Peak Flow --      Pain Score 10/14/15 1033 7     Pain Loc --      Pain Edu? --      Excl. in GC? --    Updated Vital Signs BP 132/60 (BP Location: Left Arm)   Pulse 69   Temp 98.8 F (37.1 C) (Oral)   Resp 18   Ht 5\' 7"  (1.702 m)   Wt 230 lb (104.3 kg)   SpO2 100%   BMI 36.02 kg/m   Physical Exam  Constitutional: She is oriented to person, place, and time.  Appears anxious. No apparent distress.  HENT:  Oropharynx without erythema. Normal TMs bilaterally.  Eyes:  Watery eyes.  Neck: Neck supple.  Cardiovascular: Normal rate and regular rhythm.   No murmur heard. Pulmonary/Chest: Effort normal and breath sounds normal.  Abdominal: Soft. She exhibits no distension and no mass. There is no tenderness. There is no guarding.  Lymphadenopathy:    She has no cervical adenopathy.  Neurological: She is alert and oriented to person, place, and time.  Vitals reviewed.  UC Treatments / Results  Labs (all labs ordered are listed, but only abnormal  results are displayed) Labs Reviewed - No data to display  EKG  EKG Interpretation None       Radiology Dg Chest 2 View  Result Date: 10/14/2015 CLINICAL DATA:  Cough and congestion for 1 week EXAM: CHEST  2 VIEW COMPARISON:  02/20/2015 FINDINGS: Normal heart size, mediastinal contours, and pulmonary vascularity. No pleural effusion or pneumothorax. Lungs clear. Dextroconvex thoracic scoliosis again noted. No acute abnormalities. IMPRESSION: No active cardiopulmonary disease. Electronically Signed   By: Ulyses Southward M.D.   On: 10/14/2015 11:16    Procedures  Procedures (including critical care time)  Medications Ordered in UC Medications - No data to display   Initial Impression / Assessment and Plan / UC Course  I have reviewed the triage vital signs and the nursing notes.  Pertinent labs & imaging results that were available during my care of the patient were reviewed by me and considered in my medical decision making (see chart for details).  50 year old female presents with a multitude of respiratory complaints. Additionally, patient endorsing chest tightness and pressure. Mild tenderness to palpation on exam. PE unlikely given lack of tachycardia and hypertension. Chest pain is right-sided and does not appear to be cardiac in nature given tenderness, location, as well as preceding URI issues/complaints.Chest x-ray negative.  Given severity, will treat with prednisone and empiric antibiotic.  Final Clinical Impressions(s) / UC Diagnoses   Final diagnoses:  Respiratory infection   New Prescriptions New Prescriptions   DOXYCYCLINE (VIBRAMYCIN) 100 MG CAPSULE    Take 1 capsule (100 mg total) by mouth 2 (two) times daily.   PREDNISONE (DELTASONE) 50 MG TABLET    1 tablet daily x 5 days.     Tommie Sams, DO 10/14/15 1130

## 2015-10-14 NOTE — Discharge Instructions (Signed)
Take the medications as prescribed.  Follow up as needed.  Take care  Dr. Aryan Sparks  

## 2015-10-17 ENCOUNTER — Telehealth: Payer: Self-pay | Admitting: *Deleted

## 2015-10-17 NOTE — Telephone Encounter (Signed)
Called patient, no answer, left message asking if patient has improved and to call [facility] or PCP if symptoms persist.

## 2015-11-23 LAB — HM MAMMOGRAPHY

## 2016-01-14 ENCOUNTER — Ambulatory Visit (INDEPENDENT_AMBULATORY_CARE_PROVIDER_SITE_OTHER): Payer: BLUE CROSS/BLUE SHIELD

## 2016-01-14 ENCOUNTER — Ambulatory Visit
Admission: EM | Admit: 2016-01-14 | Discharge: 2016-01-14 | Disposition: A | Discharge status: Home / Self Care | Payer: BLUE CROSS/BLUE SHIELD | Attending: Family Medicine | Admitting: Family Medicine

## 2016-01-14 DIAGNOSIS — S59911A Unspecified injury of right forearm, initial encounter: Secondary | ICD-10-CM | POA: Diagnosis not present

## 2016-01-14 DIAGNOSIS — S161XXA Strain of muscle, fascia and tendon at neck level, initial encounter: Secondary | ICD-10-CM | POA: Diagnosis not present

## 2016-01-14 DIAGNOSIS — R0781 Pleurodynia: Secondary | ICD-10-CM

## 2016-01-14 DIAGNOSIS — S39012A Strain of muscle, fascia and tendon of lower back, initial encounter: Secondary | ICD-10-CM | POA: Diagnosis not present

## 2016-01-14 DIAGNOSIS — S3992XA Unspecified injury of lower back, initial encounter: Secondary | ICD-10-CM | POA: Diagnosis not present

## 2016-01-14 DIAGNOSIS — M25561 Pain in right knee: Secondary | ICD-10-CM | POA: Diagnosis not present

## 2016-01-14 DIAGNOSIS — M542 Cervicalgia: Secondary | ICD-10-CM | POA: Diagnosis not present

## 2016-01-14 DIAGNOSIS — M79631 Pain in right forearm: Secondary | ICD-10-CM

## 2016-01-14 DIAGNOSIS — M25521 Pain in right elbow: Secondary | ICD-10-CM | POA: Diagnosis not present

## 2016-01-14 DIAGNOSIS — S8991XA Unspecified injury of right lower leg, initial encounter: Secondary | ICD-10-CM | POA: Diagnosis not present

## 2016-01-14 DIAGNOSIS — S299XXA Unspecified injury of thorax, initial encounter: Secondary | ICD-10-CM | POA: Diagnosis not present

## 2016-01-14 DIAGNOSIS — M545 Low back pain: Secondary | ICD-10-CM | POA: Diagnosis not present

## 2016-01-14 MED ORDER — METAXALONE 800 MG PO TABS: 800.0000 mg | ORAL_TABLET | Freq: Three times a day (TID) | ORAL | 0 refills | Status: DC

## 2016-01-14 MED ORDER — MELOXICAM 15 MG PO TABS: 15.0000 mg | ORAL_TABLET | Freq: Every day | ORAL | 0 refills | Status: DC

## 2016-01-14 MED ORDER — KETOROLAC TROMETHAMINE 60 MG/2ML IM SOLN
60.0000 mg | Freq: Once | INTRAMUSCULAR | Status: AC
  Administered 2016-01-14: 60 mg via INTRAMUSCULAR
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
## 2016-01-17 ENCOUNTER — Telehealth: Payer: Self-pay | Admitting: Internal Medicine

## 2016-01-17 ENCOUNTER — Telehealth: Payer: Self-pay

## 2016-01-17 MED ORDER — METHOCARBAMOL 500 MG PO TABS: 500.0000 mg | ORAL_TABLET | Freq: Two times a day (BID) | ORAL | 0 refills | Status: DC

## 2016-01-17 NOTE — Telephone Encounter (Signed)
Patient reports having some itching with metaxolone.  Would like to try robaxin (has tolerated this in the past).  Will send rx to [facility] in [place].

## 2016-01-17 NOTE — Telephone Encounter (Signed)
Courtesy call back completed today for patients recent visit at [facility]. Patient did not answer, left message on voicemail to call back with any questions or concerns.   

## 2016-04-03 ENCOUNTER — Ambulatory Visit: Payer: Self-pay | Admitting: Cardiovascular Disease

## 2016-04-13 ENCOUNTER — Ambulatory Visit (INDEPENDENT_AMBULATORY_CARE_PROVIDER_SITE_OTHER): Payer: BLUE CROSS/BLUE SHIELD

## 2016-04-13 ENCOUNTER — Ambulatory Visit
Admission: EM | Admit: 2016-04-13 | Discharge: 2016-04-13 | Disposition: A | Discharge status: Home / Self Care | Payer: BLUE CROSS/BLUE SHIELD | Attending: Family Medicine | Admitting: Family Medicine

## 2016-04-13 ENCOUNTER — Encounter: Payer: Self-pay | Admitting: Gynecology

## 2016-04-13 DIAGNOSIS — J189 Pneumonia, unspecified organism: Secondary | ICD-10-CM

## 2016-04-13 DIAGNOSIS — R05 Cough: Secondary | ICD-10-CM | POA: Diagnosis not present

## 2016-04-13 DIAGNOSIS — R0602 Shortness of breath: Secondary | ICD-10-CM

## 2016-04-13 DIAGNOSIS — J181 Lobar pneumonia, unspecified organism: Secondary | ICD-10-CM

## 2016-04-13 MED ORDER — ALBUTEROL SULFATE HFA 108 (90 BASE) MCG/ACT IN AERS: 1.0000 | INHALATION_SPRAY | Freq: Four times a day (QID) | RESPIRATORY_TRACT | 0 refills | Status: DC | PRN

## 2016-04-13 MED ORDER — IPRATROPIUM-ALBUTEROL 0.5-2.5 (3) MG/3ML IN SOLN
3.0000 mL | Freq: Once | RESPIRATORY_TRACT | Status: AC
  Administered 2016-04-13: 3 mL via RESPIRATORY_TRACT

## 2016-04-13 MED ORDER — LEVOFLOXACIN 750 MG PO TABS: 750.0000 mg | ORAL_TABLET | Freq: Every day | ORAL | 0 refills | Status: DC

## 2016-04-13 MED ORDER — ONDANSETRON 8 MG PO TBDP
8.0000 mg | ORAL_TABLET | Freq: Once | ORAL | Status: AC
  Administered 2016-04-13: 8 mg via ORAL

## 2016-04-13 MED ORDER — BENZONATATE 200 MG PO CAPS: 200.0000 mg | ORAL_CAPSULE | Freq: Three times a day (TID) | ORAL | 0 refills | Status: DC

## 2016-04-13 NOTE — ED Triage Notes (Signed)
Per patient cough / chest congestion/ shortness of breath.

## 2016-04-13 NOTE — ED Provider Notes (Signed)
CSN: [id number]     Arrival date & time 04/13/16  0957 History   First MD Initiated Contact with Patient 04/13/16 1038     Chief Complaint  Patient presents with  . Cough   (Consider location/radiation/quality/duration/timing/severity/associated sxs/prior Treatment) HPI  This a 51 year old female who is known to our practice presents with cough chest congestion and shortness of breath. She states that this started on Wednesday but had a cold predating this by several other days. She has last night she had extreme amount of shortness of breath and had to sit up on the side of the bed. O2 sats on room air 99%. Temperature is 99.6 pulse of 97.       Past Medical History:  Diagnosis Date  . Allergy   . Chronic kidney disease    stones  . Fibrocystic breast disease 2013  . Meniere disease   . Migraines    Past Surgical History:  Procedure Laterality Date  . ABDOMINAL HYSTERECTOMY  2011   laprascopic supracervical, [place]  . APPENDECTOMY  1999  . BREAST BIOPSY Right Nov 2012    fibrocystic disease, Michela Pitcher  . CESAREAN SECTION     4 total  . CHOLECYSTECTOMY  2011  . TONSILLECTOMY AND ADENOIDECTOMY  1975   Family History  Problem Relation Age of Onset  . Hypertension Mother   . Cancer Mother     ovarian  . Arthritis Mother   . Hyperlipidemia Father   . Hypertension Father   . Diabetes Father   . Stroke Father   . Alzheimer's disease Maternal Grandmother   . Heart disease Maternal Grandmother   . Breast cancer Maternal Grandmother   . Alzheimer's disease Paternal Grandmother   . Breast cancer Paternal Grandmother    Social History  Substance Use Topics  . Smoking status: Never Smoker  . Smokeless tobacco: Never Used  . Alcohol use Yes   OB History    No data available     Review of Systems  Constitutional: Positive for activity change, chills, fatigue and fever.  HENT: Positive for postnasal drip.   Respiratory: Positive for cough, shortness of breath and  wheezing. Negative for stridor.   All other systems reviewed and are negative.   Allergies  Codeine; Hydrocodone; Tramadol; and Oxycodone  Home Medications   Prior to Admission medications   Medication Sig Start Date End Date Taking? Authorizing Provider  carvedilol (COREG) 6.25 MG tablet TAKE 1 TABLET BY MOUTH 2 TIMES DAILY. 06/20/15  Yes Antonieta Iba, MD  Cholecalciferol (VITAMIN D3) 2000 UNITS TABS Take 1 tablet by mouth daily.   Yes Historical Provider, MD  diltiazem (CARDIZEM) 30 MG tablet Take 1 tablet (30 mg total) by mouth 4 (four) times daily as needed. 06/20/15  Yes Antonieta Iba, MD  ergocalciferol (DRISDOL) 50000 units capsule Take 1 capsule (50,000 Units total) by mouth once a week. 04/14/15  Yes Sherlene Shams, MD  furosemide (LASIX) 20 MG tablet Take 1 tablet (20 mg total) by mouth daily as needed. 06/20/15  Yes Antonieta Iba, MD  hydrochlorothiazide (HYDRODIURIL) 25 MG tablet TAKE 1 TABLET (25 MG TOTAL) BY MOUTH DAILY. 06/20/15  Yes Antonieta Iba, MD  meloxicam (MOBIC) 15 MG tablet Take 1 tablet (15 mg total) by mouth daily. 01/14/16  Yes Hassan Rowan, MD  albuterol (PROVENTIL HFA;VENTOLIN HFA) 108 (90 Base) MCG/ACT inhaler Inhale 1-2 puffs into the lungs every 6 (six) hours as needed for wheezing or shortness of breath. Use with spacer  04/13/16   Lutricia Feil, PA-C  amphetamine-dextroamphetamine (ADDERALL) 20 MG tablet Take 20 mg by mouth daily.  10/05/14   Historical Provider, MD  benzonatate (TESSALON) 200 MG capsule Take 1 capsule (200 mg total) by mouth every 8 (eight) hours. As necessary for cough 04/13/16   Lutricia Feil, PA-C  doxycycline (VIBRAMYCIN) 100 MG capsule Take 1 capsule (100 mg total) by mouth 2 (two) times daily. 10/14/15   Tommie Sams, DO  levofloxacin (LEVAQUIN) 750 MG tablet Take 1 tablet (750 mg total) by mouth daily. 04/13/16   Lutricia Feil, PA-C  metaxalone (SKELAXIN) 800 MG tablet Take 1 tablet (800 mg total) by mouth 3 (three) times  daily. If not covered may substitute Norflex 100 mg #60 one twice a day 01/14/16   Hassan Rowan, MD  methocarbamol (ROBAXIN) 500 MG tablet Take 1 tablet (500 mg total) by mouth 2 (two) times daily. 01/17/16   Eustace Moore, MD  predniSONE (DELTASONE) 50 MG tablet 1 tablet daily x 5 days. 10/14/15   Tommie Sams, DO   Meds Ordered and Administered this Visit   Medications  ipratropium-albuterol (DUONEB) 0.5-2.5 (3) MG/3ML nebulizer solution 3 mL (3 mLs Nebulization Given 04/13/16 1137)  ondansetron (ZOFRAN-ODT) disintegrating tablet 8 mg (8 mg Oral Given 04/13/16 1145)    BP 114/77 (BP Location: Left Arm)   Pulse 97   Temp 99.6 F (37.6 C) (Oral)   Resp 16   Ht 5\' 9"  (1.753 m)   Wt 227 lb (103 kg)   SpO2 99%   BMI 33.52 kg/m  No data found.   Physical Exam  Constitutional: She is oriented to person, place, and time. She appears well-developed and well-nourished. No distress.  HENT:  Head: Normocephalic and atraumatic.  Right Ear: External ear normal.  Left Ear: External ear normal.  Nose: Nose normal.  Mouth/Throat: Oropharynx is clear and moist. No oropharyngeal exudate.  Eyes: EOM are normal. Pupils are equal, round, and reactive to light. Right eye exhibits no discharge. Left eye exhibits no discharge.  Neck: Normal range of motion. Neck supple.  Pulmonary/Chest: Effort normal. No respiratory distress. She has no wheezes. She has rales.  She has crackles in the right base.  Musculoskeletal: Normal range of motion.  Lymphadenopathy:    She has no cervical adenopathy.  Neurological: She is alert and oriented to person, place, and time.  Skin: Skin is warm and dry. She is not diaphoretic.  Psychiatric: She has a normal mood and affect. Her behavior is normal. Judgment and thought content normal.  Nursing note and vitals reviewed.   Urgent Care Course     Procedures (including critical care time)  Labs Review Labs Reviewed - No data to display  Imaging Review Dg Chest  2 View  Result Date: 04/13/2016 CLINICAL DATA:  Cough over the last 2 days. EXAM: CHEST  2 VIEW COMPARISON:  01/14/2016.  10/14/2015. FINDINGS: Heart size is normal. Mediastinal shadows are normal. There is mild patchy infiltrate in the right lower lobe consistent with mild pneumonia. No dense consolidation or lobar collapse. No effusions. Left lung is clear. Chronic spinal curvature as seen previously. IMPRESSION: Patchy right lower lobe pneumonia. Electronically Signed   By: Paulina Fusi M.D.   On: 04/13/2016 11:37     Visual Acuity Review  Right Eye Distance:   Left Eye Distance:   Bilateral Distance:    Right Eye Near:   Left Eye Near:    Bilateral Near:  Medications  ipratropium-albuterol (DUONEB) 0.5-2.5 (3) MG/3ML nebulizer solution 3 mL (3 mLs Nebulization Given 04/13/16 1137)  ondansetron (ZOFRAN-ODT) disintegrating tablet 8 mg (8 mg Oral Given 04/13/16 1145)      MDM   1. Community acquired pneumonia of right lower lobe of lung [facility](HCC)    Discharge Medication List as of 04/13/2016 11:57 AM    START taking these medications   Details  benzonatate (TESSALON) 200 MG capsule Take 1 capsule (200 mg total) by mouth every 8 (eight) hours. As necessary for cough, Starting Sun 04/13/2016, Normal    levofloxacin (LEVAQUIN) 750 MG tablet Take 1 tablet (750 mg total) by mouth daily., Starting Sun 04/13/2016, Normal      Plan: 1. Test/x-ray results and diagnosis reviewed with patient 2. rx as per orders; risks, benefits, potential side effects reviewed with patient 3. Recommend supportive treatment with Rest and fluids. Use albuterol inhaler as necessary for shortness of breath. If you fever worsens or your having more difficulty breathing he should go immediately to the emergency room. 4. F/u prn if symptoms worsen or don't improve     Lutricia FeilWilliam P Roemer, PA-C 04/13/16 1424

## 2016-04-29 ENCOUNTER — Ambulatory Visit (INDEPENDENT_AMBULATORY_CARE_PROVIDER_SITE_OTHER): Payer: BLUE CROSS/BLUE SHIELD | Admitting: Internal Medicine

## 2016-04-29 ENCOUNTER — Encounter: Payer: Self-pay | Admitting: Internal Medicine

## 2016-04-29 VITALS — BP 128/80 | HR 84 | Temp 98.1°F | Resp 16 | Ht 69.0 in | Wt 233.0 lb

## 2016-04-29 DIAGNOSIS — R5383 Other fatigue: Secondary | ICD-10-CM | POA: Diagnosis not present

## 2016-04-29 DIAGNOSIS — I10 Essential (primary) hypertension: Secondary | ICD-10-CM

## 2016-04-29 DIAGNOSIS — J189 Pneumonia, unspecified organism: Secondary | ICD-10-CM

## 2016-04-29 DIAGNOSIS — Z0000 Encounter for general adult medical examination without abnormal findings: Secondary | ICD-10-CM

## 2016-04-29 DIAGNOSIS — F4320 Adjustment disorder, unspecified: Secondary | ICD-10-CM

## 2016-04-29 DIAGNOSIS — E7800 Pure hypercholesterolemia, unspecified: Secondary | ICD-10-CM

## 2016-04-29 DIAGNOSIS — R7301 Impaired fasting glucose: Secondary | ICD-10-CM

## 2016-04-29 DIAGNOSIS — F4321 Adjustment disorder with depressed mood: Secondary | ICD-10-CM

## 2016-04-29 DIAGNOSIS — Z1239 Encounter for other screening for malignant neoplasm of breast: Secondary | ICD-10-CM

## 2016-04-29 DIAGNOSIS — Z1231 Encounter for screening mammogram for malignant neoplasm of breast: Secondary | ICD-10-CM

## 2016-04-29 DIAGNOSIS — E559 Vitamin D deficiency, unspecified: Secondary | ICD-10-CM

## 2016-04-29 DIAGNOSIS — Z8701 Personal history of pneumonia (recurrent): Secondary | ICD-10-CM

## 2016-04-29 DIAGNOSIS — E669 Obesity, unspecified: Secondary | ICD-10-CM | POA: Diagnosis not present

## 2016-04-29 DIAGNOSIS — J181 Lobar pneumonia, unspecified organism: Secondary | ICD-10-CM

## 2016-04-29 NOTE — Patient Instructions (Addendum)
Good to see you!  You have lost 9 lbs since the last time I saw you!!  I will order your mammogram  And make the referral for colonoscopy   Return at the end of April for a repeat chest x ray,  No appointment required   Health Maintenance, Female Adopting a healthy lifestyle and getting preventive care can go a long way to promote health and wellness. Talk with your health care provider about what schedule of regular examinations is right for you. This is a good chance for you to check in with your provider about disease prevention and staying healthy. In between checkups, there are plenty of things you can do on your own. Experts have done a lot of research about which lifestyle changes and preventive measures are most likely to keep you healthy. Ask your health care provider for more information. Weight and diet Eat a healthy diet  Be sure to include plenty of vegetables, fruits, low-fat dairy products, and lean protein.  Do not eat a lot of foods high in solid fats, added sugars, or salt.  Get regular exercise. This is one of the most important things you can do for your health.  Most adults should exercise for at least 150 minutes each week. The exercise should increase your heart rate and make you sweat (moderate-intensity exercise).  Most adults should also do strengthening exercises at least twice a week. This is in addition to the moderate-intensity exercise. Maintain a healthy weight  Body mass index (BMI) is a measurement that can be used to identify possible weight problems. It estimates body fat based on height and weight. Your health care provider can help determine your BMI and help you achieve or maintain a healthy weight.  For females 75 years of age and older:  A BMI below 18.5 is considered underweight.  A BMI of 18.5 to 24.9 is normal.  A BMI of 25 to 29.9 is considered overweight.  A BMI of 30 and above is considered obese. Watch levels of cholesterol and blood  lipids  You should start having your blood tested for lipids and cholesterol at 52 years of age, then have this test every 5 years.  You may need to have your cholesterol levels checked more often if:  Your lipid or cholesterol levels are high.  You are older than 52 years of age.  You are at high risk for heart disease. Cancer screening Lung Cancer  Lung cancer screening is recommended for adults 73-19 years old who are at high risk for lung cancer because of a history of smoking.  A yearly low-dose CT scan of the lungs is recommended for people who:  Currently smoke.  Have quit within the past 15 years.  Have at least a 30-pack-year history of smoking. A pack year is smoking an average of one pack of cigarettes a day for 1 year.  Yearly screening should continue until it has been 15 years since you quit.  Yearly screening should stop if you develop a health problem that would prevent you from having lung cancer treatment. Breast Cancer  Practice breast self-awareness. This means understanding how your breasts normally appear and feel.  It also means doing regular breast self-exams. Let your health care provider know about any changes, no matter how small.  If you are in your 20s or 30s, you should have a clinical breast exam (CBE) by a health care provider every 1-3 years as part of a regular health exam.  If you are 40 or older, have a CBE every year. Also consider having a breast X-ray (mammogram) every year.  If you have a family history of breast cancer, talk to your health care provider about genetic screening.  If you are at high risk for breast cancer, talk to your health care provider about having an MRI and a mammogram every year.  Breast cancer gene (BRCA) assessment is recommended for women who have family members with BRCA-related cancers. BRCA-related cancers include:  Breast.  Ovarian.  Tubal.  Peritoneal cancers.  Results of the assessment will  determine the need for genetic counseling and BRCA1 and BRCA2 testing. Cervical Cancer  Your health care provider may recommend that you be screened regularly for cancer of the pelvic organs (ovaries, uterus, and vagina). This screening involves a pelvic examination, including checking for microscopic changes to the surface of your cervix (Pap test). You may be encouraged to have this screening done every 3 years, beginning at age 10.  For women ages 23-65, health care providers may recommend pelvic exams and Pap testing every 3 years, or they may recommend the Pap and pelvic exam, combined with testing for human papilloma virus (HPV), every 5 years. Some types of HPV increase your risk of cervical cancer. Testing for HPV may also be done on women of any age with unclear Pap test results.  Other health care providers may not recommend any screening for nonpregnant women who are considered low risk for pelvic cancer and who do not have symptoms. Ask your health care provider if a screening pelvic exam is right for you.  If you have had past treatment for cervical cancer or a condition that could lead to cancer, you need Pap tests and screening for cancer for at least 20 years after your treatment. If Pap tests have been discontinued, your risk factors (such as having a new sexual partner) need to be reassessed to determine if screening should resume. Some women have medical problems that increase the chance of getting cervical cancer. In these cases, your health care provider may recommend more frequent screening and Pap tests. Colorectal Cancer  This type of cancer can be detected and often prevented.  Routine colorectal cancer screening usually begins at 52 years of age and continues through 52 years of age.  Your health care provider may recommend screening at an earlier age if you have risk factors for colon cancer.  Your health care provider may also recommend using home test kits to check for  hidden blood in the stool.  A small camera at the end of a tube can be used to examine your colon directly (sigmoidoscopy or colonoscopy). This is done to check for the earliest forms of colorectal cancer.  Routine screening usually begins at age 71.  Direct examination of the colon should be repeated every 5-10 years through 52 years of age. However, you may need to be screened more often if early forms of precancerous polyps or small growths are found. Skin Cancer  Check your skin from head to toe regularly.  Tell your health care provider about any new moles or changes in moles, especially if there is a change in a mole's shape or color.  Also tell your health care provider if you have a mole that is larger than the size of a pencil eraser.  Always use sunscreen. Apply sunscreen liberally and repeatedly throughout the day.  Protect yourself by wearing long sleeves, pants, a wide-brimmed hat, and sunglasses whenever  you are outside. Heart disease, diabetes, and high blood pressure  High blood pressure causes heart disease and increases the risk of stroke. High blood pressure is more likely to develop in:  People who have blood pressure in the high end of the normal range (130-139/85-89 mm Hg).  People who are overweight or obese.  People who are African American.  If you are 93-31 years of age, have your blood pressure checked every 3-5 years. If you are 5 years of age or older, have your blood pressure checked every year. You should have your blood pressure measured twice-once when you are at a hospital or clinic, and once when you are not at a hospital or clinic. Record the average of the two measurements. To check your blood pressure when you are not at a hospital or clinic, you can use:  An automated blood pressure machine at a pharmacy.  A home blood pressure monitor.  If you are between 41 years and 71 years old, ask your health care provider if you should take aspirin to  prevent strokes.  Have regular diabetes screenings. This involves taking a blood sample to check your fasting blood sugar level.  If you are at a normal weight and have a low risk for diabetes, have this test once every three years after 52 years of age.  If you are overweight and have a high risk for diabetes, consider being tested at a younger age or more often. Preventing infection Hepatitis B  If you have a higher risk for hepatitis B, you should be screened for this virus. You are considered at high risk for hepatitis B if:  You were born in a country where hepatitis B is common. Ask your health care provider which countries are considered high risk.  Your parents were born in a high-risk country, and you have not been immunized against hepatitis B (hepatitis B vaccine).  You have HIV or AIDS.  You use needles to inject street drugs.  You live with someone who has hepatitis B.  You have had sex with someone who has hepatitis B.  You get hemodialysis treatment.  You take certain medicines for conditions, including cancer, organ transplantation, and autoimmune conditions. Hepatitis C  Blood testing is recommended for:  Everyone born from [birth year] through 1966.  Anyone with known risk factors for hepatitis C. Sexually transmitted infections (STIs)  You should be screened for sexually transmitted infections (STIs) including gonorrhea and chlamydia if:  You are sexually active and are younger than 52 years of age.  You are older than 52 years of age and your health care provider tells you that you are at risk for this type of infection.  Your sexual activity has changed since you were last screened and you are at an increased risk for chlamydia or gonorrhea. Ask your health care provider if you are at risk.  If you do not have HIV, but are at risk, it may be recommended that you take a prescription medicine daily to prevent HIV infection. This is called pre-exposure  prophylaxis (PrEP). You are considered at risk if:  You are sexually active and do not regularly use condoms or know the HIV status of your partner(s).  You take drugs by injection.  You are sexually active with a partner who has HIV. Talk with your health care provider about whether you are at high risk of being infected with HIV. If you choose to begin PrEP, you should first be tested for HIV.  You should then be tested every 3 months for as long as you are taking PrEP. Pregnancy  If you are premenopausal and you may become pregnant, ask your health care provider about preconception counseling.  If you may become pregnant, take 400 to 800 micrograms (mcg) of folic acid every day.  If you want to prevent pregnancy, talk to your health care provider about birth control (contraception). Osteoporosis and menopause  Osteoporosis is a disease in which the bones lose minerals and strength with aging. This can result in serious bone fractures. Your risk for osteoporosis can be identified using a bone density scan.  If you are 78 years of age or older, or if you are at risk for osteoporosis and fractures, ask your health care provider if you should be screened.  Ask your health care provider whether you should take a calcium or vitamin D supplement to lower your risk for osteoporosis.  Menopause may have certain physical symptoms and risks.  Hormone replacement therapy may reduce some of these symptoms and risks. Talk to your health care provider about whether hormone replacement therapy is right for you. Follow these instructions at home:  Schedule regular health, dental, and eye exams.  Stay current with your immunizations.  Do not use any tobacco products including cigarettes, chewing tobacco, or electronic cigarettes.  If you are pregnant, do not drink alcohol.  If you are breastfeeding, limit how much and how often you drink alcohol.  Limit alcohol intake to no more than 1 drink  per day for nonpregnant women. One drink equals 12 ounces of beer, 5 ounces of wine, or 1 ounces of hard liquor.  Do not use street drugs.  Do not share needles.  Ask your health care provider for help if you need support or information about quitting drugs.  Tell your health care provider if you often feel depressed.  Tell your health care provider if you have ever been abused or do not feel safe at home. This information is not intended to replace advice given to you by your health care provider. Make sure you discuss any questions you have with your health care provider. Document Released: 07/29/2010 Document Revised: 06/21/2015 Document Reviewed: 10/17/2014 Elsevier Interactive Patient Education  2017 [employer].

## 2016-04-29 NOTE — Progress Notes (Signed)
Pre visit review using our clinic review tool, if applicable. No additional management support is needed unless otherwise documented below in the visit note. 

## 2016-04-29 NOTE — Progress Notes (Signed)
Patient ID: Amanda Humphrey, female    DOB: 10/28/1965  Age: 51 y.o. MRN: [id number]  The patient is here for preventive examination and management of other chronic and acute problems.  Treated for CAP with levaquin in March 18 by ED KJ doing PAP smear on cervical cuff,  and breast exam Mother survived ovarian ca   Developed rapdly progressive dementia and died in 2015-09-09  As screening tool Wants to use cologuard   Has been trying to lose weight: eating less.  Trying to move more has lost 9 lbs,  Since  December  .   The risk factors are reflected in the social history.  The roster of all physicians providing medical care to patient - is listed in the Snapshot section of the chart.  Activities of daily living:  The patient is 100% independent in all ADLs: dressing, toileting, feeding as well as independent mobility  Home safety : The patient has smoke detectors in the home. They wear seatbelts.  There are no firearms at home. There is no violence in the home.   There is no risks for hepatitis, STDs or HIV. There is no   history of blood transfusion. They have no travel history to infectious disease endemic areas of the world.  The patient has seen their dentist in the last six month. They have seen their eye doctor in the last year. They admit to slight hearing difficulty with regard to whispered voices and some television programs.  They have deferred audiologic testing in the last year.  They do not  have excessive sun exposure. Discussed the need for sun protection: hats, long sleeves and use of sunscreen if there is significant sun exposure.   Diet: the importance of a healthy diet is discussed. They do have a healthy diet.  The benefits of regular aerobic exercise were discussed. She walks 4 times per week ,  20 minutes.   Depression screen: there are no signs or vegative symptoms of depression- irritability, change in appetite, anhedonia, sadness/tearfullness.  The following  portions of the patient's history were reviewed and updated as appropriate: allergies, current medications, past family history, past medical history,  past surgical history, past social history  and problem list.  Visual acuity was not assessed per patient preference since she has regular follow up with her ophthalmologist. Hearing and body mass index were assessed and reviewed.   During the course of the visit the patient was educated and counseled about appropriate screening and preventive services including : fall prevention , diabetes screening, nutrition counseling, colorectal cancer screening, and recommended immunizations.    CC: The primary encounter diagnosis was Visit for preventive health examination. Diagnoses of Essential hypertension, Pure hypercholesterolemia, Obesity (BMI 30-39.9), Other fatigue, Impaired fasting glucose, Vitamin D deficiency, Community acquired pneumonia of right lower lobe of lung (HCC), Breast cancer screening, History of pneumonia, and Grief reaction were also pertinent to this visit.  History Amanda Humphrey has a past medical history of Allergy; Chronic kidney disease; Fibrocystic breast disease (2013); Meniere disease; and Migraines.   She has a past surgical history that includes Cholecystectomy (2011); Appendectomy (1999); Tonsillectomy and adenoidectomy (1975); Cesarean section; Abdominal hysterectomy (2011); and Breast biopsy (Right, Nov 2012).   Her family history includes Alzheimer's disease in her maternal grandmother and paternal grandmother; Arthritis in her mother; Breast cancer in her maternal grandmother and paternal grandmother; Cancer in her mother; Diabetes in her father; Heart disease in her maternal grandmother; Hyperlipidemia in her father; Hypertension  in her father and mother; Stroke in her father.She reports that she has never smoked. She has never used smokeless tobacco. She reports that she drinks alcohol. She reports that she does not use  drugs.  Outpatient Medications Prior to Visit  Medication Sig Dispense Refill  . albuterol (PROVENTIL HFA;VENTOLIN HFA) 108 (90 Base) MCG/ACT inhaler Inhale 1-2 puffs into the lungs every 6 (six) hours as needed for wheezing or shortness of breath. Use with spacer 1 Inhaler 0  . benzonatate (TESSALON) 200 MG capsule Take 1 capsule (200 mg total) by mouth every 8 (eight) hours. As necessary for cough 30 capsule 0  . carvedilol (COREG) 6.25 MG tablet TAKE 1 TABLET BY MOUTH 2 TIMES DAILY. 180 tablet 3  . Cholecalciferol (VITAMIN D3) 2000 UNITS TABS Take 1 tablet by mouth daily.    . furosemide (LASIX) 20 MG tablet Take 1 tablet (20 mg total) by mouth daily as needed. 90 tablet 3  . hydrochlorothiazide (HYDRODIURIL) 25 MG tablet TAKE 1 TABLET (25 MG TOTAL) BY MOUTH DAILY. 90 tablet 3  . methocarbamol (ROBAXIN) 500 MG tablet Take 1 tablet (500 mg total) by mouth 2 (two) times daily. 20 tablet 0  . amphetamine-dextroamphetamine (ADDERALL) 20 MG tablet Take 20 mg by mouth daily.   0  . diltiazem (CARDIZEM) 30 MG tablet Take 1 tablet (30 mg total) by mouth 4 (four) times daily as needed. (Patient not taking: Reported on 04/29/2016) 120 tablet 6  . doxycycline (VIBRAMYCIN) 100 MG capsule Take 1 capsule (100 mg total) by mouth 2 (two) times daily. (Patient not taking: Reported on 04/29/2016) 20 capsule 0  . ergocalciferol (DRISDOL) 50000 units capsule Take 1 capsule (50,000 Units total) by mouth once a week. (Patient not taking: Reported on 04/29/2016) 12 capsule 0  . levofloxacin (LEVAQUIN) 750 MG tablet Take 1 tablet (750 mg total) by mouth daily. (Patient not taking: Reported on 04/29/2016) 10 tablet 0  . meloxicam (MOBIC) 15 MG tablet Take 1 tablet (15 mg total) by mouth daily. (Patient not taking: Reported on 04/29/2016) 30 tablet 0  . metaxalone (SKELAXIN) 800 MG tablet Take 1 tablet (800 mg total) by mouth 3 (three) times daily. If not covered may substitute Norflex 100 mg #60 one twice a day (Patient not  taking: Reported on 04/29/2016) 60 tablet 0  . predniSONE (DELTASONE) 50 MG tablet 1 tablet daily x 5 days. (Patient not taking: Reported on 04/29/2016) 5 tablet 0   No facility-administered medications prior to visit.     Review of Systems   Patient denies headache, fevers, malaise, unintentional weight loss, skin rash, eye pain, sinus congestion and sinus pain, sore throat, dysphagia,  hemoptysis , cough, dyspnea, wheezing, chest pain, palpitations, orthopnea, edema, abdominal pain, nausea, melena, diarrhea, constipation, flank pain, dysuria, hematuria, urinary  Frequency, nocturia, numbness, tingling, seizures,  Focal weakness, Loss of consciousness,  Tremor, insomnia, depression, anxiety, and suicidal ideation.      Objective:  BP 128/80   Pulse 84   Temp 98.1 F (36.7 C) (Oral)   Resp 16   Ht  (1.753 m)   Wt 233 lb (105.7 kg)   SpO2 98%   BMI 34.41 kg/m   Physical Exam   General appearance: alert, cooperative and appears stated age Ears: normal TM's and external ear canals both ears Throat: lips, mucosa, and tongue normal; teeth and gums normal Neck: no adenopathy, no carotid bruit, supple, symmetrical, trachea midline and thyroid not enlarged, symmetric, no tenderness/mass/nodules Back: symmetric, no curvature.  ROM normal. No CVA tenderness. Lungs: clear to auscultation bilaterally Heart: regular rate and rhythm, S1, S2 normal, no murmur, click, rub or gallop Abdomen: soft, non-tender; bowel sounds normal; no masses,  no organomegaly Pulses: 2+ and symmetric Skin: Skin color, texture, turgor normal. No rashes or lesions Lymph nodes: Cervical, supraclavicular, and axillary nodes normal.    Assessment & Plan:   Problem List Items Addressed This Visit    Essential hypertension    Well controlled on current regimen. Renal function stable, no changes today.  Lab Results  Component Value Date   CREATININE 0.91 04/29/2016   Lab Results  Component Value Date   NA  139 04/29/2016   K 3.6 04/29/2016   CL 100 04/29/2016   CO2 32 04/29/2016         Relevant Orders   Comprehensive metabolic panel (Completed)   Grief reaction    Patient is dealing with the  loss of mother in July 2017. She  has adequate coping skills and emotional support .        History of pneumonia    Treated in ED on arch 18 with levaquin for RLL infiltrate. Symptoms resolved except for mild pleurrisy with cough,  Right side, repeat chest x ray due April 30 to ensure resolution of RLL infiltrate       Hyperlipidemia    Using the Framingham risk calculator,  her 10 year risk of coronary artery disease is 7%.  Lab Results  Component Value Date   CHOL 233 (H) 04/29/2016   HDL 57.00 04/29/2016   LDLCALC 146 (H) 04/29/2016   LDLDIRECT 137.4 02/25/2013   TRIG 150.0 (H) 04/29/2016   CHOLHDL 4 04/29/2016         Relevant Orders   Lipid panel (Completed)   Obesity (BMI 30-39.9)    I have addressed  BMI and recommended a low glycemic index diet utilizing smaller more frequent meals to increase metabolism.  I have also recommended that patient start exercising with a goal of 30 minutes of aerobic exercise a minimum of 5 days per week. Screening for lipid disorders, thyroid and diabetes to be done today.  Lab Results  Component Value Date   HGBA1C 5.4 04/29/2016         Visit for preventive health examination - Primary    Annual comprehensive preventive exam was done as well as an evaluation and management of chronic conditions .  During the course of the visit the patient was educated and counseled about appropriate screening and preventive services including :  diabetes screening, lipid analysis with projected  10 year  risk for CAD , nutrition counseling, breast, cervical and colorectal cancer screening, and recommended immunizations.  Printed recommendations for health maintenance screenings was given.    cologuard orderd.       Vitamin D deficiency    Low again at  70.  Resume drisdol      Relevant Orders   VITAMIN D 25 Hydroxy (Vit-D Deficiency, Fractures) (Completed)    Other Visit Diagnoses    Other fatigue       Relevant Orders   TSH (Completed)   CBC with Differential/Platelet (Completed)   Impaired fasting glucose       Relevant Orders   Hemoglobin A1c (Completed)   Community acquired pneumonia of right lower lobe of lung (HCC)       Relevant Orders   DG Chest 2 View   Breast cancer screening       Relevant Orders  MM SCREENING BREAST TOMO BILATERAL      I have discontinued Ms. Amanda Humphrey's amphetamine-dextroamphetamine, diltiazem, predniSONE, doxycycline, meloxicam, metaxalone, and levofloxacin. I am also having her maintain her Vitamin D3, carvedilol, furosemide, hydrochlorothiazide, methocarbamol, benzonatate, albuterol, and ergocalciferol.  Meds ordered this encounter  Medications  . ergocalciferol (DRISDOL) 50000 units capsule    Sig: Take 1 capsule (50,000 Units total) by mouth once a week.    Dispense:  12 capsule    Refill:  0    Medications Discontinued During This Encounter  Medication Reason  . amphetamine-dextroamphetamine (ADDERALL) 20 MG tablet Patient has not taken in last 30 days  . diltiazem (CARDIZEM) 30 MG tablet Patient has not taken in last 30 days  . doxycycline (VIBRAMYCIN) 100 MG capsule Patient has not taken in last 30 days  . ergocalciferol (DRISDOL) 50000 units capsule Patient has not taken in last 30 days  . levofloxacin (LEVAQUIN) 750 MG tablet Patient has not taken in last 30 days  . meloxicam (MOBIC) 15 MG tablet Patient has not taken in last 30 days  . metaxalone (SKELAXIN) 800 MG tablet Patient has not taken in last 30 days  . predniSONE (DELTASONE) 50 MG tablet Error    Follow-up: No Follow-up on file.   Sherlene Shams, MD

## 2016-04-30 DIAGNOSIS — Z8701 Personal history of pneumonia (recurrent): Secondary | ICD-10-CM | POA: Insufficient documentation

## 2016-04-30 DIAGNOSIS — F4321 Adjustment disorder with depressed mood: Secondary | ICD-10-CM | POA: Insufficient documentation

## 2016-04-30 LAB — COMPREHENSIVE METABOLIC PANEL
ALT: 11 U/L (ref 0–35)
AST: 14 U/L (ref 0–37)
Albumin: 4.4 g/dL (ref 3.5–5.2)
Alkaline Phosphatase: 78 U/L (ref 39–117)
BUN: 14 mg/dL (ref 6–23)
CO2: 32 mEq/L (ref 19–32)
Calcium: 10.1 mg/dL (ref 8.4–10.5)
Chloride: 100 mEq/L (ref 96–112)
Creatinine, Ser: 0.91 mg/dL (ref 0.40–1.20)
GFR: 69.08 mL/min (ref >60.00)
Glucose, Bld: 100 mg/dL — ABNORMAL HIGH (ref 70–99)
Potassium: 3.6 mEq/L (ref 3.5–5.1)
Sodium: 139 mEq/L (ref 135–145)
Total Bilirubin: 0.9 mg/dL (ref 0.2–1.2)
Total Protein: 6.8 g/dL (ref 6.0–8.3)

## 2016-04-30 LAB — CBC WITH DIFFERENTIAL/PLATELET
Basophils Absolute: 0.1 10*3/uL (ref 0.0–0.1)
Basophils Relative: 1.3 % (ref 0.0–3.0)
Eosinophils Absolute: 0.2 10*3/uL (ref 0.0–0.7)
Eosinophils Relative: 2.4 % (ref 0.0–5.0)
HCT: 41.6 % (ref 36.0–46.0)
Hemoglobin: 14.2 g/dL (ref 12.0–15.0)
Lymphocytes Relative: 26.2 % (ref 12.0–46.0)
Lymphs Abs: 2.4 10*3/uL (ref 0.7–4.0)
MCHC: 34.1 g/dL (ref 30.0–36.0)
MCV: 88.4 fl (ref 78.0–100.0)
Monocytes Absolute: 0.5 10*3/uL (ref 0.1–1.0)
Monocytes Relative: 5.5 % (ref 3.0–12.0)
Neutro Abs: 6 10*3/uL (ref 1.4–7.7)
Neutrophils Relative %: 64.6 % (ref 43.0–77.0)
Platelets: 378 10*3/uL (ref 150.0–400.0)
RBC: 4.71 Mil/uL (ref 3.87–5.11)
RDW: 14 % (ref 11.5–15.5)
WBC: 9.3 10*3/uL (ref 4.0–10.5)

## 2016-04-30 LAB — LIPID PANEL
Cholesterol: 233 mg/dL — ABNORMAL HIGH (ref 0–200)
HDL: 57 mg/dL (ref >39.00)
LDL Cholesterol: 146 mg/dL — ABNORMAL HIGH (ref 0–99)
NonHDL: 175.84
Total CHOL/HDL Ratio: 4
Triglycerides: 150 mg/dL — ABNORMAL HIGH (ref 0.0–149.0)
VLDL: 30 mg/dL (ref 0.0–40.0)

## 2016-04-30 LAB — HEMOGLOBIN A1C: Hgb A1c MFr Bld: 5.4 % (ref 4.6–6.5)

## 2016-04-30 LAB — VITAMIN D 25 HYDROXY (VIT D DEFICIENCY, FRACTURES): VITD: 19.82 ng/mL — ABNORMAL LOW (ref 30.00–100.00)

## 2016-04-30 LAB — TSH: TSH: 0.8 u[IU]/mL (ref 0.35–4.50)

## 2016-04-30 MED ORDER — ERGOCALCIFEROL 1.25 MG (50000 UT) PO CAPS: 50000.0000 [IU] | ORAL_CAPSULE | ORAL | 0 refills | Status: DC

## 2016-04-30 NOTE — Assessment & Plan Note (Signed)
Using the Framingham risk calculator,  her 10 year risk of coronary artery disease is 7%.  Lab Results  Component Value Date   CHOL 233 (H) 04/29/2016   HDL 57.00 04/29/2016   LDLCALC 146 (H) 04/29/2016   LDLDIRECT 137.4 02/25/2013   TRIG 150.0 (H) 04/29/2016   CHOLHDL 4 04/29/2016

## 2016-04-30 NOTE — Assessment & Plan Note (Signed)
Low again at 19.  Resume drisdol

## 2016-04-30 NOTE — Assessment & Plan Note (Signed)
Well controlled on current regimen. Renal function stable, no changes today.  Lab Results  Component Value Date   CREATININE 0.91 04/29/2016   Lab Results  Component Value Date   NA 139 04/29/2016   K 3.6 04/29/2016   CL 100 04/29/2016   CO2 32 04/29/2016

## 2016-04-30 NOTE — Assessment & Plan Note (Signed)
Annual comprehensive preventive exam was done as well as an evaluation and management of chronic conditions .  During the course of the visit the patient was educated and counseled about appropriate screening and preventive services including :  diabetes screening, lipid analysis with projected  10 year  risk for CAD , nutrition counseling, breast, cervical and colorectal cancer screening, and recommended immunizations.  Printed recommendations for health maintenance screenings was given.    cologuard orderd.

## 2016-04-30 NOTE — Assessment & Plan Note (Signed)
Treated in ED on arch 18 with levaquin for RLL infiltrate. Symptoms resolved except for mild pleurrisy with cough,  Right side, repeat chest x ray due April 30 to ensure resolution of RLL infiltrate

## 2016-04-30 NOTE — Assessment & Plan Note (Addendum)
Patient is dealing with the  loss of mother in July 2017. She  has adequate coping skills and emotional support .

## 2016-04-30 NOTE — Assessment & Plan Note (Signed)
I have addressed  BMI and recommended a low glycemic index diet utilizing smaller more frequent meals to increase metabolism.  I have also recommended that patient start exercising with a goal of 30 minutes of aerobic exercise a minimum of 5 days per week. Screening for lipid disorders, thyroid and diabetes to be done today.  Lab Results  Component Value Date   HGBA1C 5.4 04/29/2016

## 2016-05-04 ENCOUNTER — Encounter: Payer: Self-pay | Admitting: Internal Medicine

## 2016-05-05 MED ORDER — ERGOCALCIFEROL 1.25 MG (50000 UT) PO CAPS: 50000.0000 [IU] | ORAL_CAPSULE | ORAL | 0 refills | Status: DC

## 2016-05-21 ENCOUNTER — Encounter: Payer: Self-pay | Admitting: Internal Medicine

## 2016-05-27 DIAGNOSIS — Z1212 Encounter for screening for malignant neoplasm of rectum: Secondary | ICD-10-CM | POA: Diagnosis not present

## 2016-05-27 DIAGNOSIS — Z1211 Encounter for screening for malignant neoplasm of colon: Secondary | ICD-10-CM | POA: Diagnosis not present

## 2016-05-29 LAB — COLOGUARD: Cologuard: NEGATIVE

## 2016-06-09 ENCOUNTER — Telehealth: Payer: Self-pay | Admitting: Internal Medicine

## 2016-06-10 ENCOUNTER — Ambulatory Visit: Payer: BLUE CROSS/BLUE SHIELD | Attending: Internal Medicine

## 2016-08-18 ENCOUNTER — Other Ambulatory Visit: Payer: Self-pay | Admitting: Cardiovascular Disease

## 2016-08-19 NOTE — Telephone Encounter (Signed)
Refill Request for HCTZ 25 mg tablet. Pt has not been seen since 2016 pt has scheduled appointment. Please advise if ok to refill until pt has OV.

## 2016-08-19 NOTE — Telephone Encounter (Signed)
Pt has not been seen since 2016 needs appointment.

## 2016-08-19 NOTE — Telephone Encounter (Signed)
Scheduled [facility]/16 [facility] [address] [facility]

## 2016-09-24 NOTE — Progress Notes (Signed)
Cardiology Office Note  Date:  09/25/2016   ID:  Tyna JakschYvette S Humphrey, DOB July 02, 1965, MRN [id number]  PCP:  Sherlene Shamsullo, Teresa L, MD   Chief Complaint  Patient presents with  . other    12 month follow up. Patient c/o swelling and pain in legs. Meds reviewed verbally with patient.     HPI:  Ms. Amanda Humphrey is a very pleasant 51 year old woman with remote history of cardiomyopathy approximately 18 years ago after having her third child with mild depression of her ejection fraction which returned back to normal 6 months later,  periodic SVT, palpitations,  lower extremity swelling  who presents for routine follow-up of her arrhythmia  In follow-up today, she reports that she is doing well. Occasional arrhythmia, palpitations, tachycardia Typically well controlled on Coreg 6.25 twice a day Wearing compression hose on a regular basis More swelling in the summer when she does not wear compressions  52 year old daughter Total cholesterol trending upwards, now 59233 some family history of coronary artery disease Interested in cholesterol medication  Blood pressure seems to be well controlled on current medication regimen  EKG shows no sinus rhythm with rate 75 bpm, right bundle branch block, no change from prior EKG    other past medical history  Prior history of cardiopathy many years ago that seemed to significantly improve with aggressive diuresis. Approximately 18 years ago, Lasix was provided with 16 pound weight loss and improvement of her symptoms. On her fourth child she had fluid retention but no recurrent cardiomyopathy  She does not do any regular exercise program. She leads a busy lifestyle, works in the hospital EKG today shows normal sinus rhythm with right bundle branch block, rate 67 beats per minute. Unchanged from prior EKG in November 2012   prior echocardiogram 12/16/2010 showing normal ejection fraction rate and 55%, mild MR   PMH:   has a past medical history of Allergy;  Chronic kidney disease; Fibrocystic breast disease (2013); Meniere disease; and Migraines.  PSH:    Past Surgical History:  Procedure Laterality Date  . ABDOMINAL HYSTERECTOMY  2011   laprascopic supracervical, [place]  . APPENDECTOMY  1999  . BREAST BIOPSY Right Nov 2012    fibrocystic disease, Michela Pitcherly  . CESAREAN SECTION     4 total  . CHOLECYSTECTOMY  2011  . TONSILLECTOMY AND ADENOIDECTOMY  1975    Current Outpatient Prescriptions  Medication Sig Dispense Refill  . carvedilol (COREG) 6.25 MG tablet TAKE 1 TABLET BY MOUTH 2 TIMES DAILY. 180 tablet 3  . Cholecalciferol (VITAMIN D3) 2000 UNITS TABS Take 1 tablet by mouth daily.    . ergocalciferol (DRISDOL) 50000 units capsule Take 1 capsule (50,000 Units total) by mouth once a week. 12 capsule 0  . furosemide (LASIX) 20 MG tablet Take 1 tablet (20 mg total) by mouth daily as needed. 90 tablet 3  . hydrochlorothiazide (HYDRODIURIL) 25 MG tablet TAKE 1 TABLET DAILY 90 tablet 3  . methocarbamol (ROBAXIN) 500 MG tablet Take 1 tablet (500 mg total) by mouth 2 (two) times daily. 20 tablet 0   No current facility-administered medications for this visit.      Allergies:   Codeine; Hydrocodone; Tramadol; and Oxycodone   Social History:  The patient  reports that she has never smoked. She has never used smokeless tobacco. She reports that she drinks alcohol. She reports that she does not use drugs.   Family History:   family history includes Alzheimer's disease in her maternal grandmother and paternal grandmother; Arthritis  in her mother; Breast cancer in her maternal grandmother and paternal grandmother; Cancer in her mother; Diabetes in her father; Heart disease in her maternal grandmother; Hyperlipidemia in her father; Hypertension in her father and mother; Stroke in her father.    Review of Systems: Review of Systems  Constitutional: Negative.   Respiratory: Negative.   Cardiovascular: Positive for palpitations.   Gastrointestinal: Negative.   Musculoskeletal: Negative.   Neurological: Negative.   Psychiatric/Behavioral: Negative.   All other systems reviewed and are negative.    PHYSICAL EXAM: VS:  BP 126/78 (BP Location: Left Arm, Patient Position: Sitting, Cuff Size: Normal)   Pulse 75   Ht 5\' 8"  (1.727 m)   Wt 245 lb (111.1 kg)   BMI 37.25 kg/m  , BMI Body mass index is 37.25 kg/m. GEN: Well nourished, well developed, in no acute distress  HEENT: normal  Neck: no JVD, carotid bruits, or masses Cardiac: RRR; no murmurs, rubs, or gallops,no edema  Respiratory:  clear to auscultation bilaterally, normal work of breathing GI: soft, nontender, nondistended, + BS MS: no deformity or atrophy  Skin: warm and dry, no rash Neuro:  Strength and sensation are intact Psych: euthymic mood, full affect    Recent Labs: 04/29/2016: ALT 11; BUN 14; Creatinine, Ser 0.91; Hemoglobin 14.2; Platelets 378.0; Potassium 3.6; Sodium 139; TSH 0.80    Lipid Panel Lab Results  Component Value Date   CHOL 233 (H) 04/29/2016   HDL 57.00 04/29/2016   LDLCALC 146 (H) 04/29/2016   TRIG 150.0 (H) 04/29/2016      Wt Readings from Last 3 Encounters:  09/25/16 245 lb (111.1 kg)  04/29/16 233 lb (105.7 kg)  04/13/16 227 lb (103 kg)       ASSESSMENT AND PLAN:  SVT (supraventricular tachycardia) (HCC) Continue Coreg, extra doses if needed  Essential hypertension Blood pressure is well controlled on today's visit. No changes made to the medications.  Mixed hyperlipidemia Start Crestor 5 mg with recheck blood work in several months time, consider up to 10 mg in the future. She would like to cut the pill in half  Obesity (BMI 30-39.9) We have encouraged continued exercise, careful diet management in an effort to lose weight.  Lower extremity edema Likely dependent edema, continue compression hose   Total encounter time more than 15 minutes  Greater than 50% was spent in counseling and coordination  of care with the patient   Disposition:   F/U  6 months  No orders of the defined types were placed in this encounter.    Signed, Dossie Arbour, M.D., Ph.D. 09/25/2016  [facility] [place], [place] [phone]

## 2016-09-25 ENCOUNTER — Ambulatory Visit (INDEPENDENT_AMBULATORY_CARE_PROVIDER_SITE_OTHER): Payer: BLUE CROSS/BLUE SHIELD | Admitting: Cardiovascular Disease

## 2016-09-25 ENCOUNTER — Encounter: Payer: Self-pay | Admitting: Cardiovascular Disease

## 2016-09-25 VITALS — BP 126/78 | HR 75 | Ht 68.0 in | Wt 245.0 lb

## 2016-09-25 DIAGNOSIS — I471 Supraventricular tachycardia: Secondary | ICD-10-CM

## 2016-09-25 DIAGNOSIS — R600 Localized edema: Secondary | ICD-10-CM | POA: Diagnosis not present

## 2016-09-25 DIAGNOSIS — I10 Essential (primary) hypertension: Secondary | ICD-10-CM

## 2016-09-25 DIAGNOSIS — Z8679 Personal history of other diseases of the circulatory system: Secondary | ICD-10-CM

## 2016-09-25 DIAGNOSIS — E669 Obesity, unspecified: Secondary | ICD-10-CM | POA: Diagnosis not present

## 2016-09-25 DIAGNOSIS — E782 Mixed hyperlipidemia: Secondary | ICD-10-CM | POA: Diagnosis not present

## 2016-09-25 MED ORDER — CARVEDILOL 6.25 MG PO TABS: ORAL_TABLET | ORAL | 3 refills | Status: DC

## 2016-09-25 MED ORDER — ROSUVASTATIN CALCIUM 10 MG PO TABS: 10.0000 mg | ORAL_TABLET | Freq: Every day | ORAL | 3 refills | Status: DC

## 2016-09-25 MED ORDER — HYDROCHLOROTHIAZIDE 25 MG PO TABS: 25.0000 mg | ORAL_TABLET | Freq: Every day | ORAL | 3 refills | Status: DC

## 2016-09-25 MED ORDER — FUROSEMIDE 20 MG PO TABS: 20.0000 mg | ORAL_TABLET | Freq: Every day | ORAL | 3 refills | Status: DC | PRN

## 2016-09-25 NOTE — Patient Instructions (Signed)
Medication Instructions:   Please start crestor daily  Labwork:  No new labs needed Call for labs in 3 to 4 months  Testing/Procedures:  No further testing at this time   Follow-Up: It was a pleasure seeing you in the office today. Please call us if you have new issues that need to be addressed before your next appt.  [phone]  Your physician wants you to follow-up in: 12 months.  You will receive a reminder letter in the mail two months in advance. If you don't receive a letter, please call our office to schedule the follow-up appointment.  If you need a refill on your cardiac medications before your next appointment, please call your pharmacy.

## 2016-10-22 ENCOUNTER — Other Ambulatory Visit: Payer: Self-pay | Admitting: Internal Medicine

## 2016-10-22 ENCOUNTER — Encounter: Payer: Self-pay | Admitting: Internal Medicine

## 2016-10-22 MED ORDER — PREDNISONE 10 MG PO TABS: ORAL_TABLET | ORAL | 0 refills | Status: DC

## 2016-10-22 MED ORDER — LEVOFLOXACIN 500 MG PO TABS: 500.0000 mg | ORAL_TABLET | Freq: Every day | ORAL | 0 refills | Status: DC

## 2016-10-22 NOTE — Telephone Encounter (Signed)
Error

## 2016-11-22 LAB — HM PAP SMEAR: HM Pap smear: NORMAL

## 2017-07-05 ENCOUNTER — Other Ambulatory Visit: Payer: Self-pay

## 2017-07-05 ENCOUNTER — Encounter: Payer: Self-pay | Admitting: Gynecology

## 2017-07-05 ENCOUNTER — Ambulatory Visit
Admission: EM | Admit: 2017-07-05 | Discharge: 2017-07-05 | Disposition: A | Discharge status: Home / Self Care | Payer: BLUE CROSS/BLUE SHIELD | Attending: Family Medicine | Admitting: Family Medicine

## 2017-07-05 DIAGNOSIS — J0100 Acute maxillary sinusitis, unspecified: Secondary | ICD-10-CM

## 2017-07-05 MED ORDER — DOXYCYCLINE HYCLATE 100 MG PO CAPS: 100.0000 mg | ORAL_CAPSULE | Freq: Two times a day (BID) | ORAL | 0 refills | Status: DC

## 2017-07-05 NOTE — ED Provider Notes (Signed)
[facility]    CSN: [id number] Arrival date & time: 07/05/17  1119   History   Chief Complaint Chief Complaint  Patient presents with  . Sinusitis   HPI  52 year old female presents with concerns for sinusitis.  Patient reports a one-week history of sinus pain and pressure and associated congestion.  Pain and pressure is predominantly left maxillary sinus and left frontal sinus.  No fever.  She has had no relief with over-the-counter Sudafed.  No known exacerbating factors.  Symptoms are severe.  Worsening.  No other associated symptoms.  No other complaints.  Past Medical History:  Diagnosis Date  . Allergy   . Chronic kidney disease    stones  . Fibrocystic breast disease 2013  . Meniere disease   . Migraines     Patient Active Problem List   Diagnosis Date Noted  . History of pneumonia 04/30/2016  . Grief reaction 04/30/2016  . Vitamin D deficiency 02/22/2015  . Hyperlipidemia 01/01/2015  . Visit for preventive health examination 12/04/2013  . Lower extremity edema 09/02/2013  . Essential hypertension 09/02/2013  . History of cardiomyopathy 09/02/2013  . SVT (supraventricular tachycardia) (HCC) 09/02/2013  . Palpitations 09/02/2013  . Contrast media allergy 03/10/2013  . Fibrocystic breast disease   . Obesity (BMI 30-39.9) 02/05/2012  . Meniere disease     Past Surgical History:  Procedure Laterality Date  . ABDOMINAL HYSTERECTOMY  2011   laprascopic supracervical, DeFrancesco  . APPENDECTOMY  1999  . BREAST BIOPSY Right Nov 2012    fibrocystic disease, Michela Pitcher  . CESAREAN SECTION     4 total  . CHOLECYSTECTOMY  2011  . TONSILLECTOMY AND ADENOIDECTOMY  1975    OB History   None      Home Medications    Prior to Admission medications   Medication Sig Start Date End Date Taking? Authorizing Provider  carvedilol (COREG) 6.25 MG tablet TAKE 1 TABLET BY MOUTH 2 TIMES DAILY. 09/25/16  Yes Gollan, Tollie Pizza, MD  furosemide (LASIX) 20 MG tablet  Take 1 tablet (20 mg total) by mouth daily as needed. 09/25/16  Yes Antonieta Iba, MD  hydrochlorothiazide (HYDRODIURIL) 25 MG tablet Take 1 tablet (25 mg total) by mouth daily. 09/25/16  Yes Gollan, Tollie Pizza, MD  methocarbamol (ROBAXIN) 500 MG tablet Take 1 tablet (500 mg total) by mouth 2 (two) times daily. 01/17/16  Yes Isa Rankin, MD  doxycycline (VIBRAMYCIN) 100 MG capsule Take 1 capsule (100 mg total) by mouth 2 (two) times daily. 07/05/17   Tommie Sams, DO    Family History Family History  Problem Relation Age of Onset  . Hypertension Mother   . Cancer Mother        ovarian  . Arthritis Mother   . Hyperlipidemia Father   . Hypertension Father   . Diabetes Father   . Stroke Father   . Alzheimer's disease Maternal Grandmother   . Heart disease Maternal Grandmother   . Breast cancer Maternal Grandmother   . Alzheimer's disease Paternal Grandmother   . Breast cancer Paternal Grandmother     Social History Social History   Tobacco Use  . Smoking status: Never Smoker  . Smokeless tobacco: Never Used  Substance Use Topics  . Alcohol use: Yes  . Drug use: No     Allergies   Codeine; Hydrocodone; Tramadol; and Oxycodone   Review of Systems Review of Systems  Constitutional: Negative.   HENT: Positive for sinus pressure and sinus pain.  Physical Exam Triage Vital Signs ED Triage Vitals  Enc Vitals Group     BP 07/05/17 1136 129/69     Pulse Rate 07/05/17 1136 66     Resp 07/05/17 1136 16     Temp 07/05/17 1136 98.3 F (36.8 C)     Temp Source 07/05/17 1136 Oral     SpO2 07/05/17 1136 100 %     Weight 07/05/17 1134 229 lb (103.9 kg)     Height 07/05/17 1134 5\' 9"  (1.753 m)     Head Circumference --      Peak Flow --      Pain Score 07/05/17 1134 6     Pain Loc --      Pain Edu? --      Excl. in GC? --    Updated Vital Signs BP 129/69 (BP Location: Left Arm)   Pulse 66   Temp 98.3 F (36.8 C) (Oral)   Resp 16   Ht 5\' 9"  (1.753 m)   Wt  229 lb (103.9 kg)   SpO2 100%   BMI 33.82 kg/m    Physical Exam  Constitutional: She is oriented to person, place, and time. She appears well-developed. No distress.  HENT:  Head: Normocephalic and atraumatic.  Maxillary sinus tenderness to palpation of the left. Normal TM's.  Cardiovascular: Normal rate and regular rhythm.  Pulmonary/Chest: Effort normal and breath sounds normal. She has no wheezes. She has no rales.  Neurological: She is alert and oriented to person, place, and time.  Psychiatric: She has a normal mood and affect. Her behavior is normal.  Nursing note and vitals reviewed.  UC Treatments / Results  Labs (all labs ordered are listed, but only abnormal results are displayed) Labs Reviewed - No data to display  EKG None  Radiology No results found.  Procedures Procedures (including critical care time)  Medications Ordered in UC Medications - No data to display  Initial Impression / Assessment and Plan / UC Course  I have reviewed the triage vital signs and the nursing notes.  Pertinent labs & imaging results that were available during my care of the patient were reviewed by me and considered in my medical decision making (see chart for details).    52 year old female presents with sinusitis.  Treating with Doxy.  Final Clinical Impressions(s) / UC Diagnoses   Final diagnoses:  Acute maxillary sinusitis, recurrence not specified   Discharge Instructions   None    ED Prescriptions    Medication Sig Dispense Auth. Provider   doxycycline (VIBRAMYCIN) 100 MG capsule Take 1 capsule (100 mg total) by mouth 2 (two) times daily. 14 capsule Tommie Samsook, Elden Brucato G, DO     Controlled Substance Prescriptions [place] Controlled Substance Registry consulted? Not Applicable   Tommie SamsCook, Calani Gick G, DO 07/05/17 1258

## 2017-07-05 NOTE — ED Triage Notes (Signed)
Patient c/o sinus pressure and earache x 1 week.

## 2017-09-22 ENCOUNTER — Encounter: Payer: Self-pay | Admitting: Internal Medicine

## 2017-09-22 ENCOUNTER — Ambulatory Visit: Payer: BLUE CROSS/BLUE SHIELD | Admitting: Internal Medicine

## 2017-09-22 ENCOUNTER — Ambulatory Visit (INDEPENDENT_AMBULATORY_CARE_PROVIDER_SITE_OTHER): Payer: BLUE CROSS/BLUE SHIELD

## 2017-09-22 VITALS — BP 110/70 | HR 73 | Temp 98.6°F | Resp 15 | Ht 69.0 in | Wt 249.0 lb

## 2017-09-22 DIAGNOSIS — E559 Vitamin D deficiency, unspecified: Secondary | ICD-10-CM

## 2017-09-22 DIAGNOSIS — J181 Lobar pneumonia, unspecified organism: Secondary | ICD-10-CM

## 2017-09-22 DIAGNOSIS — I10 Essential (primary) hypertension: Secondary | ICD-10-CM | POA: Diagnosis not present

## 2017-09-22 DIAGNOSIS — R918 Other nonspecific abnormal finding of lung field: Secondary | ICD-10-CM | POA: Diagnosis not present

## 2017-09-22 DIAGNOSIS — R5383 Other fatigue: Secondary | ICD-10-CM | POA: Diagnosis not present

## 2017-09-22 DIAGNOSIS — Z0000 Encounter for general adult medical examination without abnormal findings: Secondary | ICD-10-CM | POA: Diagnosis not present

## 2017-09-22 DIAGNOSIS — Z1231 Encounter for screening mammogram for malignant neoplasm of breast: Secondary | ICD-10-CM

## 2017-09-22 DIAGNOSIS — Z1239 Encounter for other screening for malignant neoplasm of breast: Secondary | ICD-10-CM

## 2017-09-22 DIAGNOSIS — E669 Obesity, unspecified: Secondary | ICD-10-CM

## 2017-09-22 DIAGNOSIS — Z8701 Personal history of pneumonia (recurrent): Secondary | ICD-10-CM

## 2017-09-22 LAB — TSH: TSH: 1.69 u[IU]/mL (ref 0.35–4.50)

## 2017-09-22 LAB — COMPREHENSIVE METABOLIC PANEL
ALT: 18 U/L (ref 0–35)
AST: 17 U/L (ref 0–37)
Albumin: 4.3 g/dL (ref 3.5–5.2)
Alkaline Phosphatase: 81 U/L (ref 39–117)
BUN: 17 mg/dL (ref 6–23)
CO2: 29 mEq/L (ref 19–32)
Calcium: 10 mg/dL (ref 8.4–10.5)
Chloride: 102 mEq/L (ref 96–112)
Creatinine, Ser: 0.91 mg/dL (ref 0.40–1.20)
GFR: 68.71 mL/min (ref >60.00)
Glucose, Bld: 79 mg/dL (ref 70–99)
Potassium: 4.1 mEq/L (ref 3.5–5.1)
Sodium: 139 mEq/L (ref 135–145)
Total Bilirubin: 0.7 mg/dL (ref 0.2–1.2)
Total Protein: 7.1 g/dL (ref 6.0–8.3)

## 2017-09-22 LAB — VITAMIN D 25 HYDROXY (VIT D DEFICIENCY, FRACTURES): VITD: 19.93 ng/mL — ABNORMAL LOW (ref 30.00–100.00)

## 2017-09-22 LAB — B12 AND FOLATE PANEL
Folate: 18.9 ng/mL (ref >5.9)
Vitamin B-12: 305 pg/mL (ref 211–911)

## 2017-09-22 LAB — T4, FREE: Free T4: 0.83 ng/dL (ref 0.60–1.60)

## 2017-09-22 MED ORDER — HYDROCHLOROTHIAZIDE 25 MG PO TABS: 25.0000 mg | ORAL_TABLET | Freq: Every day | ORAL | 3 refills | Status: DC

## 2017-09-22 MED ORDER — METHOCARBAMOL 500 MG PO TABS: 500.0000 mg | ORAL_TABLET | Freq: Two times a day (BID) | ORAL | 0 refills | Status: DC

## 2017-09-22 MED ORDER — CARVEDILOL 6.25 MG PO TABS: ORAL_TABLET | ORAL | 3 refills | Status: DC

## 2017-09-22 NOTE — Patient Instructions (Signed)
Chest x ray today  No cholesterol since it looked so good last year   Health Maintenance for Postmenopausal Women Menopause is a normal process in which your reproductive ability comes to an end. This process happens gradually over a span of months to years, usually between the ages of 57 and 74. Menopause is complete when you have missed 12 consecutive menstrual periods. It is important to talk with your health care provider about some of the most common conditions that affect postmenopausal women, such as heart disease, cancer, and bone loss (osteoporosis). Adopting a healthy lifestyle and getting preventive care can help to promote your health and wellness. Those actions can also lower your chances of developing some of these common conditions. What should I know about menopause? During menopause, you may experience a number of symptoms, such as:  Moderate-to-severe hot flashes.  Night sweats.  Decrease in sex drive.  Mood swings.  Headaches.  Tiredness.  Irritability.  Memory problems.  Insomnia.  Choosing to treat or not to treat menopausal changes is an individual decision that you make with your health care provider. What should I know about hormone replacement therapy and supplements? Hormone therapy products are effective for treating symptoms that are associated with menopause, such as hot flashes and night sweats. Hormone replacement carries certain risks, especially as you become older. If you are thinking about using estrogen or estrogen with progestin treatments, discuss the benefits and risks with your health care provider. What should I know about heart disease and stroke? Heart disease, heart attack, and stroke become more likely as you age. This may be due, in part, to the hormonal changes that your body experiences during menopause. These can affect how your body processes dietary fats, triglycerides, and cholesterol. Heart attack and stroke are both medical  emergencies. There are many things that you can do to help prevent heart disease and stroke:  Have your blood pressure checked at least every 1-2 years. High blood pressure causes heart disease and increases the risk of stroke.  If you are 64-84 years old, ask your health care provider if you should take aspirin to prevent a heart attack or a stroke.  Do not use any tobacco products, including cigarettes, chewing tobacco, or electronic cigarettes. If you need help quitting, ask your health care provider.  It is important to eat a healthy diet and maintain a healthy weight. ? Be sure to include plenty of vegetables, fruits, low-fat dairy products, and lean protein. ? Avoid eating foods that are high in solid fats, added sugars, or salt (sodium).  Get regular exercise. This is one of the most important things that you can do for your health. ? Try to exercise for at least 150 minutes each week. The type of exercise that you do should increase your heart rate and make you sweat. This is known as moderate-intensity exercise. ? Try to do strengthening exercises at least twice each week. Do these in addition to the moderate-intensity exercise.  Know your numbers.Ask your health care provider to check your cholesterol and your blood glucose. Continue to have your blood tested as directed by your health care provider.  What should I know about cancer screening? There are several types of cancer. Take the following steps to reduce your risk and to catch any cancer development as early as possible. Breast Cancer  Practice breast self-awareness. ? This means understanding how your breasts normally appear and feel. ? It also means doing regular breast self-exams. Let your  health care provider know about any changes, no matter how small.  If you are 69 or older, have a clinician do a breast exam (clinical breast exam or CBE) every year. Depending on your age, family history, and medical history, it  may be recommended that you also have a yearly breast X-ray (mammogram).  If you have a family history of breast cancer, talk with your health care provider about genetic screening.  If you are at high risk for breast cancer, talk with your health care provider about having an MRI and a mammogram every year.  Breast cancer (BRCA) gene test is recommended for women who have family members with BRCA-related cancers. Results of the assessment will determine the need for genetic counseling and BRCA1 and for BRCA2 testing. BRCA-related cancers include these types: ? Breast. This occurs in males or females. ? Ovarian. ? Tubal. This may also be called fallopian tube cancer. ? Cancer of the abdominal or pelvic lining (peritoneal cancer). ? Prostate. ? Pancreatic.  Cervical, Uterine, and Ovarian Cancer Your health care provider may recommend that you be screened regularly for cancer of the pelvic organs. These include your ovaries, uterus, and vagina. This screening involves a pelvic exam, which includes checking for microscopic changes to the surface of your cervix (Pap test).  For women ages 21-65, health care providers may recommend a pelvic exam and a Pap test every three years. For women ages 35-65, they may recommend the Pap test and pelvic exam, combined with testing for human papilloma virus (HPV), every five years. Some types of HPV increase your risk of cervical cancer. Testing for HPV may also be done on women of any age who have unclear Pap test results.  Other health care providers may not recommend any screening for nonpregnant women who are considered low risk for pelvic cancer and have no symptoms. Ask your health care provider if a screening pelvic exam is right for you.  If you have had past treatment for cervical cancer or a condition that could lead to cancer, you need Pap tests and screening for cancer for at least 20 years after your treatment. If Pap tests have been discontinued  for you, your risk factors (such as having a new sexual partner) need to be reassessed to determine if you should start having screenings again. Some women have medical problems that increase the chance of getting cervical cancer. In these cases, your health care provider may recommend that you have screening and Pap tests more often.  If you have a family history of uterine cancer or ovarian cancer, talk with your health care provider about genetic screening.  If you have vaginal bleeding after reaching menopause, tell your health care provider.  There are currently no reliable tests available to screen for ovarian cancer.  Lung Cancer Lung cancer screening is recommended for adults 27-44 years old who are at high risk for lung cancer because of a history of smoking. A yearly low-dose CT scan of the lungs is recommended if you:  Currently smoke.  Have a history of at least 30 pack-years of smoking and you currently smoke or have quit within the past 15 years. A pack-year is smoking an average of one pack of cigarettes per day for one year.  Yearly screening should:  Continue until it has been 15 years since you quit.  Stop if you develop a health problem that would prevent you from having lung cancer treatment.  Colorectal Cancer  This type of cancer  can be detected and can often be prevented.  Routine colorectal cancer screening usually begins at age 43 and continues through age 19.  If you have risk factors for colon cancer, your health care provider may recommend that you be screened at an earlier age.  If you have a family history of colorectal cancer, talk with your health care provider about genetic screening.  Your health care provider may also recommend using home test kits to check for hidden blood in your stool.  A small camera at the end of a tube can be used to examine your colon directly (sigmoidoscopy or colonoscopy). This is done to check for the earliest forms of  colorectal cancer.  Direct examination of the colon should be repeated every 5-10 years until age 59. However, if early forms of precancerous polyps or small growths are found or if you have a family history or genetic risk for colorectal cancer, you may need to be screened more often.  Skin Cancer  Check your skin from head to toe regularly.  Monitor any moles. Be sure to tell your health care provider: ? About any new moles or changes in moles, especially if there is a change in a mole's shape or color. ? If you have a mole that is larger than the size of a pencil eraser.  If any of your family members has a history of skin cancer, especially at a young age, talk with your health care provider about genetic screening.  Always use sunscreen. Apply sunscreen liberally and repeatedly throughout the day.  Whenever you are outside, protect yourself by wearing long sleeves, pants, a wide-brimmed hat, and sunglasses.  What should I know about osteoporosis? Osteoporosis is a condition in which bone destruction happens more quickly than new bone creation. After menopause, you may be at an increased risk for osteoporosis. To help prevent osteoporosis or the bone fractures that can happen because of osteoporosis, the following is recommended:  If you are 74-51 years old, get at least 1,000 mg of calcium and at least 600 mg of vitamin D per day.  If you are older than age 59 but younger than age 31, get at least 1,200 mg of calcium and at least 600 mg of vitamin D per day.  If you are older than age 76, get at least 1,200 mg of calcium and at least 800 mg of vitamin D per day.  Smoking and excessive alcohol intake increase the risk of osteoporosis. Eat foods that are rich in calcium and vitamin D, and do weight-bearing exercises several times each week as directed by your health care provider. What should I know about how menopause affects my mental health? Depression may occur at any age, but it  is more common as you become older. Common symptoms of depression include:  Low or sad mood.  Changes in sleep patterns.  Changes in appetite or eating patterns.  Feeling an overall lack of motivation or enjoyment of activities that you previously enjoyed.  Frequent crying spells.  Talk with your health care provider if you think that you are experiencing depression. What should I know about immunizations? It is important that you get and maintain your immunizations. These include:  Tetanus, diphtheria, and pertussis (Tdap) booster vaccine.  Influenza every year before the flu season begins.  Pneumonia vaccine.  Shingles vaccine.  Your health care provider may also recommend other immunizations. This information is not intended to replace advice given to you by your health care provider.  Make sure you discuss any questions you have with your health care provider. Document Released: 03/07/2005 Document Revised: 08/03/2015 Document Reviewed: 10/17/2014 Elsevier Interactive Patient Education  2018 [employer].

## 2017-09-22 NOTE — Progress Notes (Signed)
Patient ID: Amanda Humphrey, female    DOB: 15-Feb-1965  Age: 52 y.o. MRN: [id number]  The patient is here for annual preventive  examination and management of other chronic and acute problems.   The risk factors are reflected in the social history.  The roster of all physicians providing medical care to patient - is listed in the Snapshot section of the chart.  Activities of daily living:  The patient is 100% independent in all ADLs: dressing, toileting, feeding as well as independent mobility  Home safety : The patient has smoke detectors in the home. They wear seatbelts.  There are no firearms at home. There is no violence in the home.   There is no risks for hepatitis, STDs or HIV. There is no   history of blood transfusion. They have no travel history to infectious disease endemic areas of the world.  The patient has seen their dentist in the last six month. They have seen their eye doctor in the last year. They deny  hearing difficulty .   They do not  have excessive sun exposure. Discussed the need for sun protection: hats, long sleeves and use of sunscreen if there is significant sun exposure.   Diet: the importance of a healthy diet is discussed. They do have a healthy diet.  The benefits of regular aerobic exercise were discussed. She walks 3 times per week ,  20 minutes.   Depression screen: there are no signs or vegative symptoms of depression- irritability, change in appetite, anhedonia, sadness/tearfullness.  Cognitive assessment: the patient manages all their financial and personal affairs and is actively engaged. They could relate day,date,year and events; recalled 2/3 objects at 3 minutes; performed clock-face test normally.  The following portions of the patient's history were reviewed and updated as appropriate: allergies, current medications, past family history, past medical history,  past surgical history, past social history  and problem list.  Visual acuity was not  assessed per patient preference since she has regular follow up with her ophthalmologist. Hearing and body mass index were assessed and reviewed.   During the course of the visit the patient was educated and counseled about appropriate screening and preventive services including : fall prevention , diabetes screening, nutrition counseling, colorectal cancer screening, and recommended immunizations.    CC: The primary encounter diagnosis was Right lower lobe consolidation (HCC). Diagnoses of Fatigue, unspecified type, Essential hypertension, Vitamin D deficiency, Breast cancer screening, Visit for preventive health examination, Obesity (BMI 30-39.9), and History of pneumonia were also pertinent to this visit.   Grief:  Lost maternal GF  In Feb after battle with leukemiia.  Brought back a recurrence of grief over losing her mother last year.  Getting great reviews at work.  Daughter is now a Holiday representative in high school. Misses her mom  Sharing in her accomplishments. Used Hospice counselling to cope    Excessive fatigue,  Not occurring daily,  No snoring,  no daytime somnolence  Not exercising regluarly ,  Averages 8K steps daily  At work .  Tried the intermittent fasting diet  For one week,  Developed Severe headaches daily at 10 am   History:  Amanda Humphrey has a past medical history of Allergy, Chronic kidney disease, Fibrocystic breast disease (2013), Meniere disease, and Migraines.   She has a past surgical history that includes Cholecystectomy (2011); Appendectomy (1999); Tonsillectomy and adenoidectomy (1975); Cesarean section; Abdominal hysterectomy (2011); and Breast biopsy (Right, Nov 2012).   Her family history includes Alzheimer's disease  in her maternal grandmother and paternal grandmother; Arthritis in her mother; Breast cancer in her maternal grandmother and paternal grandmother; Cancer in her mother; Diabetes in her father; Heart disease in her maternal grandmother; Hyperlipidemia in her father;  Hypertension in her father and mother; Stroke in her father.She reports that she has never smoked. She has never used smokeless tobacco. She reports that she drinks alcohol. She reports that she does not use drugs.  Outpatient Medications Prior to Visit  Medication Sig Dispense Refill  . furosemide (LASIX) 20 MG tablet Take 1 tablet (20 mg total) by mouth daily as needed. 90 tablet 3  . carvedilol (COREG) 6.25 MG tablet TAKE 1 TABLET BY MOUTH 2 TIMES DAILY. 180 tablet 3  . hydrochlorothiazide (HYDRODIURIL) 25 MG tablet Take 1 tablet (25 mg total) by mouth daily. 90 tablet 3  . methocarbamol (ROBAXIN) 500 MG tablet Take 1 tablet (500 mg total) by mouth 2 (two) times daily. 20 tablet 0  . doxycycline (VIBRAMYCIN) 100 MG capsule Take 1 capsule (100 mg total) by mouth 2 (two) times daily. (Patient not taking: Reported on 09/22/2017) 14 capsule 0   No facility-administered medications prior to visit.     Review of Systems  Patient denies headache, fevers, malaise, unintentional weight loss, skin rash, eye pain, sinus congestion and sinus pain, sore throat, dysphagia,  hemoptysis , cough, dyspnea, wheezing, chest pain, palpitations, orthopnea, edema, abdominal pain, nausea, melena, diarrhea, constipation, flank pain, dysuria, hematuria, urinary  Frequency, nocturia, numbness, tingling, seizures,  Focal weakness, Loss of consciousness,  Tremor, insomnia, depression, anxiety, and suicidal ideation.     Objective:  BP 110/70 (BP Location: Left Arm, Patient Position: Sitting, Cuff Size: Large)   Pulse 73   Temp 98.6 F (37 C) (Oral)   Resp 15   Ht 5\' 9"  (1.753 m)   Wt 249 lb (112.9 kg)   SpO2 99%   BMI 36.77 kg/m   Physical Exam   General appearance: alert, cooperative and appears stated age Ears: normal TM's and external ear canals both ears Throat: lips, mucosa, and tongue normal; teeth and gums normal Neck: no adenopathy, no carotid bruit, supple, symmetrical, trachea midline and thyroid  not enlarged, symmetric, no tenderness/mass/nodules Back: symmetric, no curvature. ROM normal. No CVA tenderness. Lungs: clear to auscultation bilaterally Heart: regular rate and rhythm, S1, S2 normal, no murmur, click, rub or gallop Abdomen: soft, non-tender; bowel sounds normal; no masses,  no organomegaly Pulses: 2+ and symmetric Skin: Skin color, texture, turgor normal. No rashes or lesions Lymph nodes: Cervical, supraclavicular, and axillary nodes normal.    Assessment & Plan:   Problem List Items Addressed This Visit    Essential hypertension    Well controlled on current regimen. Renal function stable, no changes today.  Lab Results  Component Value Date   CREATININE 0.91 09/22/2017   Lab Results  Component Value Date   NA 139 09/22/2017   K 4.1 09/22/2017   CL 102 09/22/2017   CO2 29 09/22/2017         Relevant Medications   carvedilol (COREG) 6.25 MG tablet   hydrochlorothiazide (HYDRODIURIL) 25 MG tablet   Other Relevant Orders   Comprehensive metabolic panel (Completed)   Fatigue    Screening labs normal.  No longer menstruating.   No history of black stools, no  history of snoring.  Recommended participating in regular exercise program with goal of achieving a minimum of 30 minutes of aerobic activity 5 days per week.   Lab  Results  Component Value Date   TSH 1.69 09/22/2017   Lab Results  Component Value Date   VITAMINB12 305 09/22/2017   Lab Results  Component Value Date   WBC 9.3 04/29/2016   HGB 14.2 04/29/2016   HCT 41.6 04/29/2016   MCV 88.4 04/29/2016   PLT 378.0 04/29/2016         Relevant Orders   TSH (Completed)   T4, free (Completed)   B12 and Folate Panel (Completed)   History of pneumonia    RLL.  Did not return for repeat chest x ay to ensure resolution   Chest x ray was normal today       Obesity (BMI 30-39.9)     have addressed  BMI and recommended wt loss of 10% of body weight over the next 6 months using a low glycemic  index diet and regular participation in aerobic exercise a minimum of 30 minutes, a  minimum of 5 days per week.       Visit for preventive health examination    Annual comprehensive preventive exam was done as well as an evaluation and management of chronic conditions .  During the course of the visit the patient was educated and counseled about appropriate screening and preventive services including :  diabetes screening, lipid analysis with projected  10 year  risk for CAD , nutrition counseling, breast, cervical and colorectal cancer screening, and recommended immunizations.  Printed recommendations for health maintenance screenings was given      Vitamin D deficiency   Relevant Orders   VITAMIN D 25 Hydroxy (Vit-D Deficiency, Fractures) (Completed)    Other Visit Diagnoses    Right lower lobe consolidation (HCC)    -  Primary   Relevant Orders   DG Chest 2 View (Completed)   Breast cancer screening       Relevant Orders   MM 3D SCREEN BREAST BILATERAL      I have discontinued Jenicka S. Stallsmith's doxycycline. I am also having her maintain her furosemide, methocarbamol, carvedilol, and hydrochlorothiazide.  Meds ordered this encounter  Medications  . methocarbamol (ROBAXIN) 500 MG tablet    Sig: Take 1 tablet (500 mg total) by mouth 2 (two) times daily.    Dispense:  90 tablet    Refill:  0  . carvedilol (COREG) 6.25 MG tablet    Sig: TAKE 1 TABLET BY MOUTH 2 TIMES DAILY.    Dispense:  180 tablet    Refill:  3  . hydrochlorothiazide (HYDRODIURIL) 25 MG tablet    Sig: Take 1 tablet (25 mg total) by mouth daily.    Dispense:  90 tablet    Refill:  3    Medications Discontinued During This Encounter  Medication Reason  . doxycycline (VIBRAMYCIN) 100 MG capsule Completed Course  . methocarbamol (ROBAXIN) 500 MG tablet Reorder  . carvedilol (COREG) 6.25 MG tablet Reorder  . hydrochlorothiazide (HYDRODIURIL) 25 MG tablet Reorder    Follow-up: Return in about 6 months (around  03/25/2018).   Sherlene Shams, MD

## 2017-09-23 DIAGNOSIS — R5383 Other fatigue: Secondary | ICD-10-CM | POA: Insufficient documentation

## 2017-09-23 NOTE — Assessment & Plan Note (Signed)
Well controlled on current regimen. Renal function stable, no changes today.  Lab Results  Component Value Date   CREATININE 0.91 09/22/2017   Lab Results  Component Value Date   NA 139 09/22/2017   K 4.1 09/22/2017   CL 102 09/22/2017   CO2 29 09/22/2017

## 2017-09-23 NOTE — Assessment & Plan Note (Signed)
Screening labs normal.  No longer menstruating.   No history of black stools, no  history of snoring.  Recommended participating in regular exercise program with goal of achieving a minimum of 30 minutes of aerobic activity 5 days per week.   Lab Results  Component Value Date   TSH 1.69 09/22/2017   Lab Results  Component Value Date   VITAMINB12 305 09/22/2017   Lab Results  Component Value Date   WBC 9.3 04/29/2016   HGB 14.2 04/29/2016   HCT 41.6 04/29/2016   MCV 88.4 04/29/2016   PLT 378.0 04/29/2016

## 2017-09-23 NOTE — Assessment & Plan Note (Signed)
Annual comprehensive preventive exam was done as well as an evaluation and management of chronic conditions .  During the course of the visit the patient was educated and counseled about appropriate screening and preventive services including :  diabetes screening, lipid analysis with projected  10 year  risk for CAD , nutrition counseling, breast, cervical and colorectal cancer screening, and recommended immunizations.  Printed recommendations for health maintenance screenings was given 

## 2017-09-23 NOTE — Assessment & Plan Note (Signed)
have addressed  BMI and recommended wt loss of 10% of body weight over the next 6 months using a low glycemic index diet and regular participation in aerobic exercise a minimum of 30 minutes, a  minimum of 5 days per week.  

## 2017-09-23 NOTE — Assessment & Plan Note (Signed)
RLL.  Did not return for repeat chest x ay to ensure resolution   Chest x ray was normal today

## 2017-10-05 ENCOUNTER — Ambulatory Visit
Admission: RE | Admit: 2017-10-05 | Discharge: 2017-10-05 | Disposition: A | Discharge status: Home / Self Care | Payer: BLUE CROSS/BLUE SHIELD | Source: Ambulatory Visit | Attending: Internal Medicine | Admitting: Internal Medicine

## 2017-10-05 DIAGNOSIS — Z1239 Encounter for other screening for malignant neoplasm of breast: Secondary | ICD-10-CM

## 2017-10-05 DIAGNOSIS — Z1231 Encounter for screening mammogram for malignant neoplasm of breast: Secondary | ICD-10-CM | POA: Diagnosis not present

## 2017-10-09 ENCOUNTER — Other Ambulatory Visit: Payer: Self-pay | Admitting: Internal Medicine

## 2017-11-01 ENCOUNTER — Other Ambulatory Visit: Payer: Self-pay

## 2017-11-01 ENCOUNTER — Ambulatory Visit
Admission: EM | Admit: 2017-11-01 | Discharge: 2017-11-01 | Disposition: A | Discharge status: Home / Self Care | Payer: BLUE CROSS/BLUE SHIELD | Attending: Family Medicine | Admitting: Family Medicine

## 2017-11-01 ENCOUNTER — Ambulatory Visit (INDEPENDENT_AMBULATORY_CARE_PROVIDER_SITE_OTHER): Payer: BLUE CROSS/BLUE SHIELD

## 2017-11-01 DIAGNOSIS — M7731 Calcaneal spur, right foot: Secondary | ICD-10-CM | POA: Diagnosis not present

## 2017-11-01 DIAGNOSIS — M79671 Pain in right foot: Secondary | ICD-10-CM

## 2017-11-01 MED ORDER — MELOXICAM 15 MG PO TABS: 15.0000 mg | ORAL_TABLET | Freq: Every day | ORAL | 0 refills | Status: DC

## 2017-11-01 NOTE — ED Triage Notes (Addendum)
Pt with one week of progressively worsening foot pain, worse around the heel. No known injury. Now pain Is spreading to lateral aspect. No pain at rest, but limping gait in triage. Pt would like an xray

## 2017-11-01 NOTE — ED Provider Notes (Signed)
[facility]    CSN: [id number] Arrival date & time: 11/01/17  0843     History   Chief Complaint Chief Complaint  Patient presents with  . Foot Pain    HPI Amanda Humphrey is a 52 y.o. female.   52 year old female presents with right foot pain for the past week. Started as some pain near her heel but pain is getting worse and spreading to her lateral ankle and lateral plantar region. No pain when resting but unable to put full pressure on foot due to pain. No known injury. No numbness or change in sensation. No previous injury or problems with her feet. Has tried ice, heat and Ibuprofen with minimal relief. Other chronic health issues include SVT and migraine headaches and currently on Coreg, HCTZ and Lasix. She also has Robaxin for occasional neck strain but has not taken this recently. Patient requests x-ray of her foot today for further evaluation.   The history is provided by the patient.    Past Medical History:  Diagnosis Date  . Allergy   . Chronic kidney disease    stones  . Fibrocystic breast disease 2013  . Meniere disease   . Migraines     Patient Active Problem List   Diagnosis Date Noted  . Fatigue 09/23/2017  . History of pneumonia 04/30/2016  . Grief reaction 04/30/2016  . Vitamin D deficiency 02/22/2015  . Hyperlipidemia 01/01/2015  . Visit for preventive health examination 12/04/2013  . Lower extremity edema 09/02/2013  . Essential hypertension 09/02/2013  . History of cardiomyopathy 09/02/2013  . SVT (supraventricular tachycardia) (HCC) 09/02/2013  . Palpitations 09/02/2013  . Contrast media allergy 03/10/2013  . Fibrocystic breast disease   . Obesity (BMI 30-39.9) 02/05/2012  . Meniere disease     Past Surgical History:  Procedure Laterality Date  . ABDOMINAL HYSTERECTOMY  2011   laprascopic supracervical, [place]  . APPENDECTOMY  1999  . BREAST BIOPSY Right Nov 2012    fibrocystic disease, Michela Pitcher  . CESAREAN SECTION     4  total  . CHOLECYSTECTOMY  2011  . TONSILLECTOMY AND ADENOIDECTOMY  1975    OB History   None      Home Medications    Prior to Admission medications   Medication Sig Start Date End Date Taking? Authorizing Provider  Vitamin D, Ergocalciferol, (DRISDOL) 50000 units CAPS capsule Take 50,000 Units by mouth every 7 (seven) days.   Yes [provider]  carvedilol (COREG) 6.25 MG tablet TAKE 1 TABLET BY MOUTH 2 TIMES DAILY. 09/22/17   Sherlene Shams, MD  furosemide (LASIX) 20 MG tablet Take 1 tablet (20 mg total) by mouth daily as needed. 09/25/16   Antonieta Iba, MD  hydrochlorothiazide (HYDRODIURIL) 25 MG tablet Take 1 tablet (25 mg total) by mouth daily. 09/22/17   Sherlene Shams, MD  meloxicam (MOBIC) 15 MG tablet Take 1 tablet (15 mg total) by mouth daily. 11/01/17   Sudie Grumbling, NP  methocarbamol (ROBAXIN) 500 MG tablet Take 1 tablet (500 mg total) by mouth 2 (two) times daily. 09/22/17   Sherlene Shams, MD    Family History Family History  Problem Relation Age of Onset  . Hypertension Mother   . Cancer Mother        ovarian  . Arthritis Mother   . Hyperlipidemia Father   . Hypertension Father   . Diabetes Father   . Stroke Father   . Alzheimer's disease Maternal Grandmother   .  Heart disease Maternal Grandmother   . Breast cancer Maternal Grandmother   . Alzheimer's disease Paternal Grandmother   . Breast cancer Paternal Grandmother     Social History Social History   Tobacco Use  . Smoking status: Never Smoker  . Smokeless tobacco: Never Used  Substance Use Topics  . Alcohol use: Yes    Comment: social  . Drug use: No     Allergies   Codeine; Hydrocodone; Tramadol; and Oxycodone   Review of Systems Review of Systems  Constitutional: Negative for activity change, appetite change, chills, fatigue and fever.  Respiratory: Negative for cough, chest tightness, shortness of breath and wheezing.   Cardiovascular: Negative for chest pain,  palpitations and leg swelling.  Gastrointestinal: Negative for nausea and vomiting.  Musculoskeletal: Positive for arthralgias and gait problem. Negative for back pain, joint swelling, myalgias, neck pain and neck stiffness.  Skin: Negative for color change, rash and wound.  Allergic/Immunologic: Negative for immunocompromised state.  Neurological: Negative for dizziness, tremors, seizures, syncope, speech difficulty, weakness, light-headedness, numbness and headaches.  Hematological: Negative for adenopathy. Does not bruise/bleed easily.  Psychiatric/Behavioral: Negative.      Physical Exam Triage Vital Signs ED Triage Vitals  Enc Vitals Group     BP 11/01/17 0857 138/67     Pulse Rate 11/01/17 0857 76     Resp 11/01/17 0857 18     Temp 11/01/17 0857 98 F (36.7 C)     Temp Source 11/01/17 0857 Oral     SpO2 11/01/17 0857 97 %     Weight 11/01/17 0857 234 lb (106.1 kg)     Height 11/01/17 0857 5\' 8"  (1.727 m)     Head Circumference --      Peak Flow --      Pain Score 11/01/17 0855 7     Pain Loc --      Pain Edu? --      Excl. in GC? --    No data found.  Updated Vital Signs BP 138/67 (BP Location: Right Arm)   Pulse 76   Temp 98 F (36.7 C) (Oral)   Resp 18   Ht 5\' 8"  (1.727 m)   Wt 234 lb (106.1 kg)   SpO2 97%   BMI 35.58 kg/m   Visual Acuity Right Eye Distance:   Left Eye Distance:   Bilateral Distance:    Right Eye Near:   Left Eye Near:    Bilateral Near:     Physical Exam  Constitutional: She is oriented to person, place, and time. Vital signs are normal. She appears well-developed and well-nourished. She is cooperative. She does not appear ill. No distress.  Patient sitting comfortably in exam chair in no acute distress but pain and limping when putting pressure on right foot and changing positions.   HENT:  Head: Normocephalic and atraumatic.  Neck: Normal range of motion.  Cardiovascular: Normal rate.  Pulmonary/Chest: Effort normal.    Musculoskeletal: Normal range of motion. She exhibits tenderness. She exhibits no edema.       Right foot: There is tenderness. There is normal range of motion, no bony tenderness, no swelling, normal capillary refill, no crepitus, no deformity and no laceration.       Feet:  Full range of motion of right foot and ankle. No distinct swelling. No redness. Tender over heel on mid plantar to medial area. Also slightly tender near medial malleolus. No neuro deficits noted. Good pulses and capillary refill.   Neurological: She  is alert and oriented to person, place, and time. She has normal strength. No sensory deficit. Gait (limping due to pain in right foot) abnormal.  Skin: Skin is warm, dry and intact. Capillary refill takes less than 2 seconds. No rash noted.  Psychiatric: She has a normal mood and affect. Her behavior is normal. Judgment and thought content normal.  Vitals reviewed.    UC Treatments / Results  Labs (all labs ordered are listed, but only abnormal results are displayed) Labs Reviewed - No data to display  EKG None  Radiology Dg Foot Complete Right  Result Date: 11/01/2017 CLINICAL DATA:  Right foot pain.  No reported injury. EXAM: RIGHT FOOT COMPLETE - 3+ VIEW COMPARISON:  None. FINDINGS: No fracture or dislocation. No suspicious focal osseous lesion. No significant arthropathy in the foot. Apparent subchondral cyst in the distal tibial plafond. No radiopaque foreign body. Small Achilles and plantar right calcaneal spurs. IMPRESSION: Small Achilles and plantar right calcaneal spurs. Apparent subchondral cyst in the distal tibial plafond and, probably degenerative. No acute osseous abnormality. Electronically Signed   By: Delbert Phenix M.D.   On: 11/01/2017 09:15    Procedures Procedures (including critical care time)  Medications Ordered in UC Medications - No data to display  Initial Impression / Assessment and Plan / UC Course  I have reviewed the triage vital  signs and the nursing notes.  Pertinent labs & imaging results that were available during my care of the patient were reviewed by me and considered in my medical decision making (see chart for details).    Reviewed x-ray results with patient- does show heel spurs as well as a probable degenerative cyst in the tibial plafond. Discussed usual treatment and need for further evaluation by a Podiatrist. Recommend trial Mobic 15mg  daily for pain and inflammation. May continue applying heat to area for comfort. May need Orthotics. Call Dr. Ether Griffins tomorrow AM to schedule appointment for follow-up and further treatment of heel spurs.  Final Clinical Impressions(s) / UC Diagnoses   Final diagnoses:  Acute foot pain, right  Calcaneal spur of foot, right     Discharge Instructions     Recommend trial Mobic 15mg  once daily as needed for foot pain. Recommend call Dr. Ether Griffins on Monday 10/7 for further evaluation and probable orthotics to help with pain.     ED Prescriptions    Medication Sig Dispense Auth. Provider   meloxicam (MOBIC) 15 MG tablet Take 1 tablet (15 mg total) by mouth daily. 30 tablet Sudie Grumbling, NP     Controlled Substance Prescriptions [place] Controlled Substance Registry consulted? Not Applicable   Sudie Grumbling, NP 11/01/17 2048

## 2017-11-01 NOTE — Discharge Instructions (Addendum)
Recommend trial Mobic 15mg  once daily as needed for foot pain. Recommend call Dr. Ether Griffins on Monday 10/7 for further evaluation and probable orthotics to help with pain.

## 2017-11-25 DIAGNOSIS — M722 Plantar fascial fibromatosis: Secondary | ICD-10-CM | POA: Diagnosis not present

## 2017-12-12 ENCOUNTER — Other Ambulatory Visit: Payer: Self-pay

## 2017-12-13 ENCOUNTER — Other Ambulatory Visit: Payer: Self-pay

## 2017-12-13 ENCOUNTER — Ambulatory Visit
Admission: EM | Admit: 2017-12-13 | Discharge: 2017-12-13 | Disposition: A | Discharge status: Home / Self Care | Payer: BLUE CROSS/BLUE SHIELD | Attending: Emergency Medicine | Admitting: Emergency Medicine

## 2017-12-13 DIAGNOSIS — J0140 Acute pansinusitis, unspecified: Secondary | ICD-10-CM

## 2017-12-13 DIAGNOSIS — H6501 Acute serous otitis media, right ear: Secondary | ICD-10-CM

## 2017-12-13 MED ORDER — DOXYCYCLINE HYCLATE 100 MG PO CAPS: 100.0000 mg | ORAL_CAPSULE | Freq: Two times a day (BID) | ORAL | 0 refills | Status: AC

## 2017-12-13 MED ORDER — MOMETASONE FUROATE 50 MCG/ACT NA SUSP
2.0000 | Freq: Every day | NASAL | 0 refills | Status: DC
Start: 2017-12-13 — End: 2018-03-06

## 2017-12-13 NOTE — ED Provider Notes (Signed)
HPI  SUBJECTIVE:  Amanda Humphrey is a 52 y.o. female who presents with cute onset of bilateral ear pain on the right more so than the left.  She states that she feels that she has fluid in her ear.  She denies change in hearing, otorrhea.  She reports dark yellow nasal congestion for the past week, rhinorrhea, postnasal drip.  States that she has had a sinus headache for the past 4 days and that her upper teeth hurt.  No fevers, coughing, double sickening, facial swelling.  She has sick contacts with similar symptoms.  No allergy symptoms.  She took ibuprofen within 4-6 hours of evaluation.  No antibiotics in the past month.  She is flying in 3 days.  She has been taking Claritin, Sudafed, 400 mg ibuprofen, heating pack to the ear.  The Sudafed, ibuprofen, heating pack help.  There are no aggravating factors for the ear pain, and the sinuses are aggravated with bending forward and lying down.  She has a past medical history of SVT.  No history of diabetes, hypertension, recurrent sinusitis.  PMD: Sherlene Shams, MD     Past Medical History:  Diagnosis Date  . Allergy   . Chronic kidney disease    stones  . Fibrocystic breast disease 2013  . Meniere disease   . Migraines     Past Surgical History:  Procedure Laterality Date  . ABDOMINAL HYSTERECTOMY  2011   laprascopic supracervical, DeFrancesco  . APPENDECTOMY  1999  . BREAST BIOPSY Right Nov 2012    fibrocystic disease, Michela Pitcher  . CESAREAN SECTION     4 total  . CHOLECYSTECTOMY  2011  . TONSILLECTOMY AND ADENOIDECTOMY  1975    Family History  Problem Relation Age of Onset  . Hypertension Mother   . Cancer Mother        ovarian  . Arthritis Mother   . Hyperlipidemia Father   . Hypertension Father   . Diabetes Father   . Stroke Father   . Alzheimer's disease Maternal Grandmother   . Heart disease Maternal Grandmother   . Breast cancer Maternal Grandmother   . Alzheimer's disease Paternal Grandmother   . Breast cancer  Paternal Grandmother     Social History   Tobacco Use  . Smoking status: Never Smoker  . Smokeless tobacco: Never Used  Substance Use Topics  . Alcohol use: Yes    Comment: social  . Drug use: No    No current facility-administered medications for this encounter.   Current Outpatient Medications:  .  carvedilol (COREG) 6.25 MG tablet, TAKE 1 TABLET BY MOUTH 2 TIMES DAILY., Disp: 180 tablet, Rfl: 3 .  doxycycline (VIBRAMYCIN) 100 MG capsule, Take 1 capsule (100 mg total) by mouth 2 (two) times daily for 7 days., Disp: 14 capsule, Rfl: 0 .  furosemide (LASIX) 20 MG tablet, Take 1 tablet (20 mg total) by mouth daily as needed., Disp: 90 tablet, Rfl: 3 .  hydrochlorothiazide (HYDRODIURIL) 25 MG tablet, Take 1 tablet (25 mg total) by mouth daily., Disp: 90 tablet, Rfl: 3 .  meloxicam (MOBIC) 15 MG tablet, Take 1 tablet (15 mg total) by mouth daily., Disp: 30 tablet, Rfl: 0 .  methocarbamol (ROBAXIN) 500 MG tablet, Take 1 tablet (500 mg total) by mouth 2 (two) times daily., Disp: 90 tablet, Rfl: 0 .  mometasone (NASONEX) 50 MCG/ACT nasal spray, Place 2 sprays into the nose daily., Disp: 17 g, Rfl: 0 .  Vitamin D, Ergocalciferol, (DRISDOL) 50000 units  CAPS capsule, Take 50,000 Units by mouth every 7 (seven) days., Disp: , Rfl:   Allergies  Allergen Reactions  . Codeine     Rash, Throat swelling,   . Hydrocodone Anaphylaxis  . Tramadol     Itching, facial swelling  . Oxycodone Rash     ROS  As noted in HPI.   Physical Exam  BP 124/76 (BP Location: Left Arm)   Pulse 64   Temp 97.7 F (36.5 C) (Oral)   Resp 16   Ht 5\' 8"  (1.727 m)   Wt 106.1 kg   SpO2 97%   BMI 35.57 kg/m   Constitutional: Well developed, well nourished, no acute distress Eyes:  EOMI, conjunctiva normal bilaterally HENT: Normocephalic, atraumatic,mucus membranes moist.  Bilateral TMs normal.  Positive serous effusion behind the right TM.  Erythematous, swollen turbinates.  No appreciable nasal  congestion.  Frontal and maxillary sinus tenderness.  Normal oropharynx.  No obvious postnasal drip. Respiratory: Normal inspiratory effort Cardiovascular: Normal rate GI: nondistended skin: No rash, skin intact Musculoskeletal: no deformities Neurologic: Alert & oriented x 3, no focal neuro deficits Psychiatric: Speech and behavior appropriate   ED Course   Medications - No data to display  No orders of the defined types were placed in this encounter.   No results found for this or any previous visit (from the past 24 hour(s)). No results found.  ED Clinical Impression  Acute non-recurrent pansinusitis  Non-recurrent acute serous otitis media of right ear   ED Assessment/Plan  Patient with a sinusitis and serous otitis media on the right side.  Will send home with doxycycline 100 mg p.o. twice daily for 7 days.,  In addition to Nasonex.  Patient states that Flonase gives her headaches.  She will continue with Sudafed, discontinue Claritin, start Mucinex.  She will start saline nasal irrigation with the Lloyd HugerNeil med rinse and distilled water, and will use Afrin before flying in several days.  Discussed medical decision-making, treatment plan with patient.  She agrees with plan.  Meds ordered this encounter  Medications  . doxycycline (VIBRAMYCIN) 100 MG capsule    Sig: Take 1 capsule (100 mg total) by mouth 2 (two) times daily for 7 days.    Dispense:  14 capsule    Refill:  0  . mometasone (NASONEX) 50 MCG/ACT nasal spray    Sig: Place 2 sprays into the nose daily.    Dispense:  17 g    Refill:  0    *This clinic note was created using Scientist, clinical (histocompatibility and immunogenetics)Dragon dictation software. Therefore, there may be occasional mistakes despite careful proofreading.   ?    Domenick GongMortenson, Soliana Kitko, MD 12/13/17 [phone]

## 2017-12-13 NOTE — ED Triage Notes (Addendum)
Pt reports "I have a sinus infection." Headache, facial pain and otalgia. "Touch" of a sore throat and blowing dark yellow out of nose

## 2017-12-13 NOTE — Discharge Instructions (Addendum)
continue Sudafed, discontinue Claritin, start Mucinex.  start saline nasal irrigation with the Lloyd HugerNeil med rinse and distilled water as often as you want.  The doxycycline will cover both sinus infection and the ear infection.  600 mg of ibuprofen combined with 1 g of Tylenol together 3-4 times a day as needed for pain. Afrin before flying in several days.

## 2017-12-14 MED ORDER — METHOCARBAMOL 500 MG PO TABS: 500.0000 mg | ORAL_TABLET | Freq: Two times a day (BID) | ORAL | 3 refills | Status: DC

## 2017-12-15 IMAGING — CR DG CHEST 2V
3 series · 3 of 3 positions shown · non-contrast
Comparison: 02/20/2015

CLINICAL DATA: Cough and congestion for 1 week

EXAM:
CHEST  2 VIEW

[chest pa]
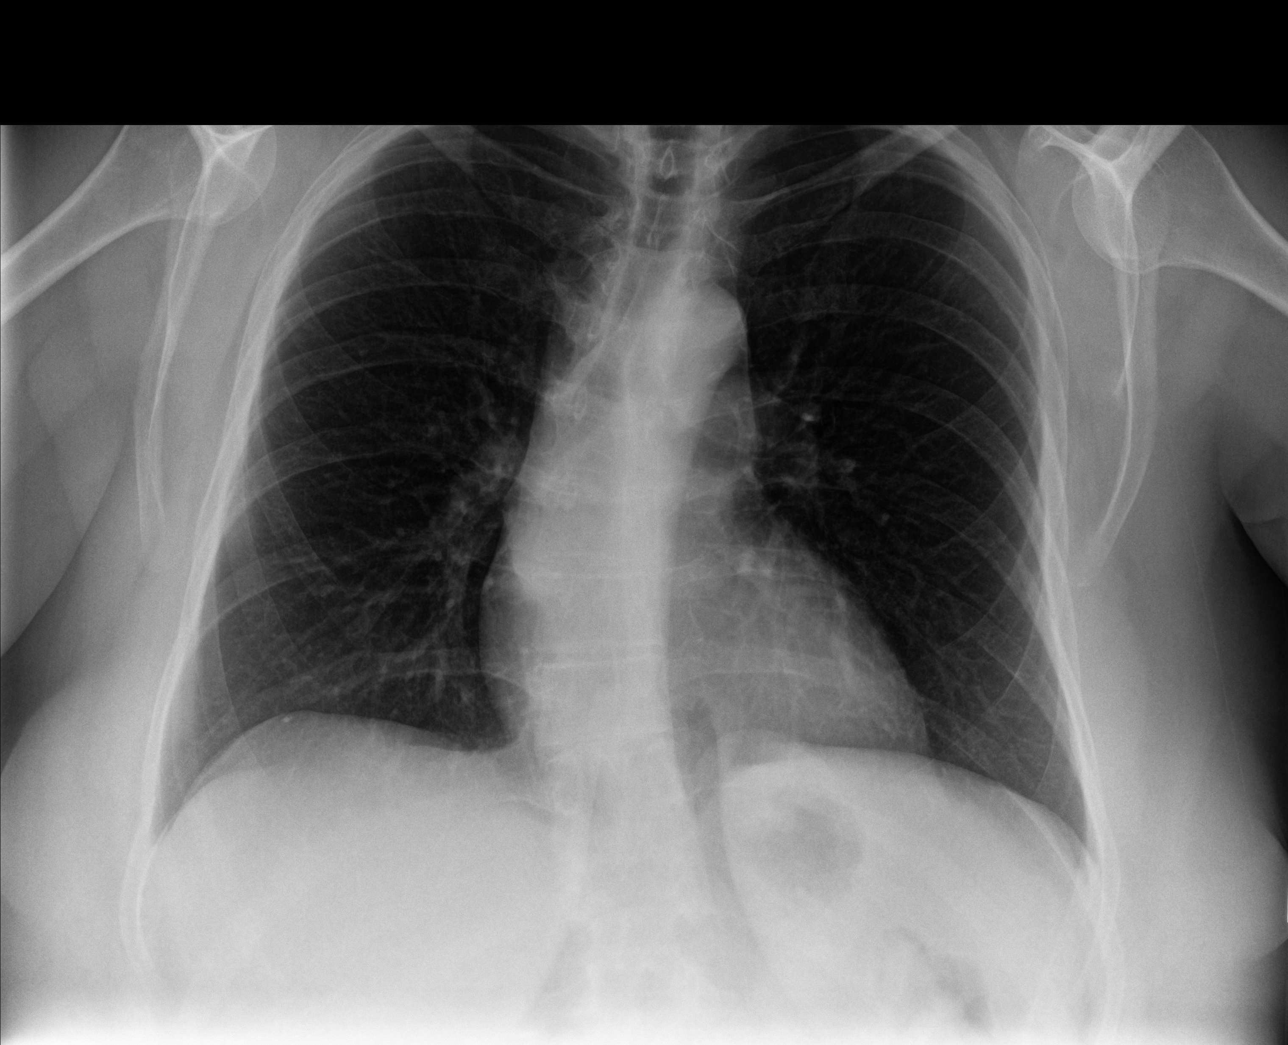

[chest lat (1 of 2)]
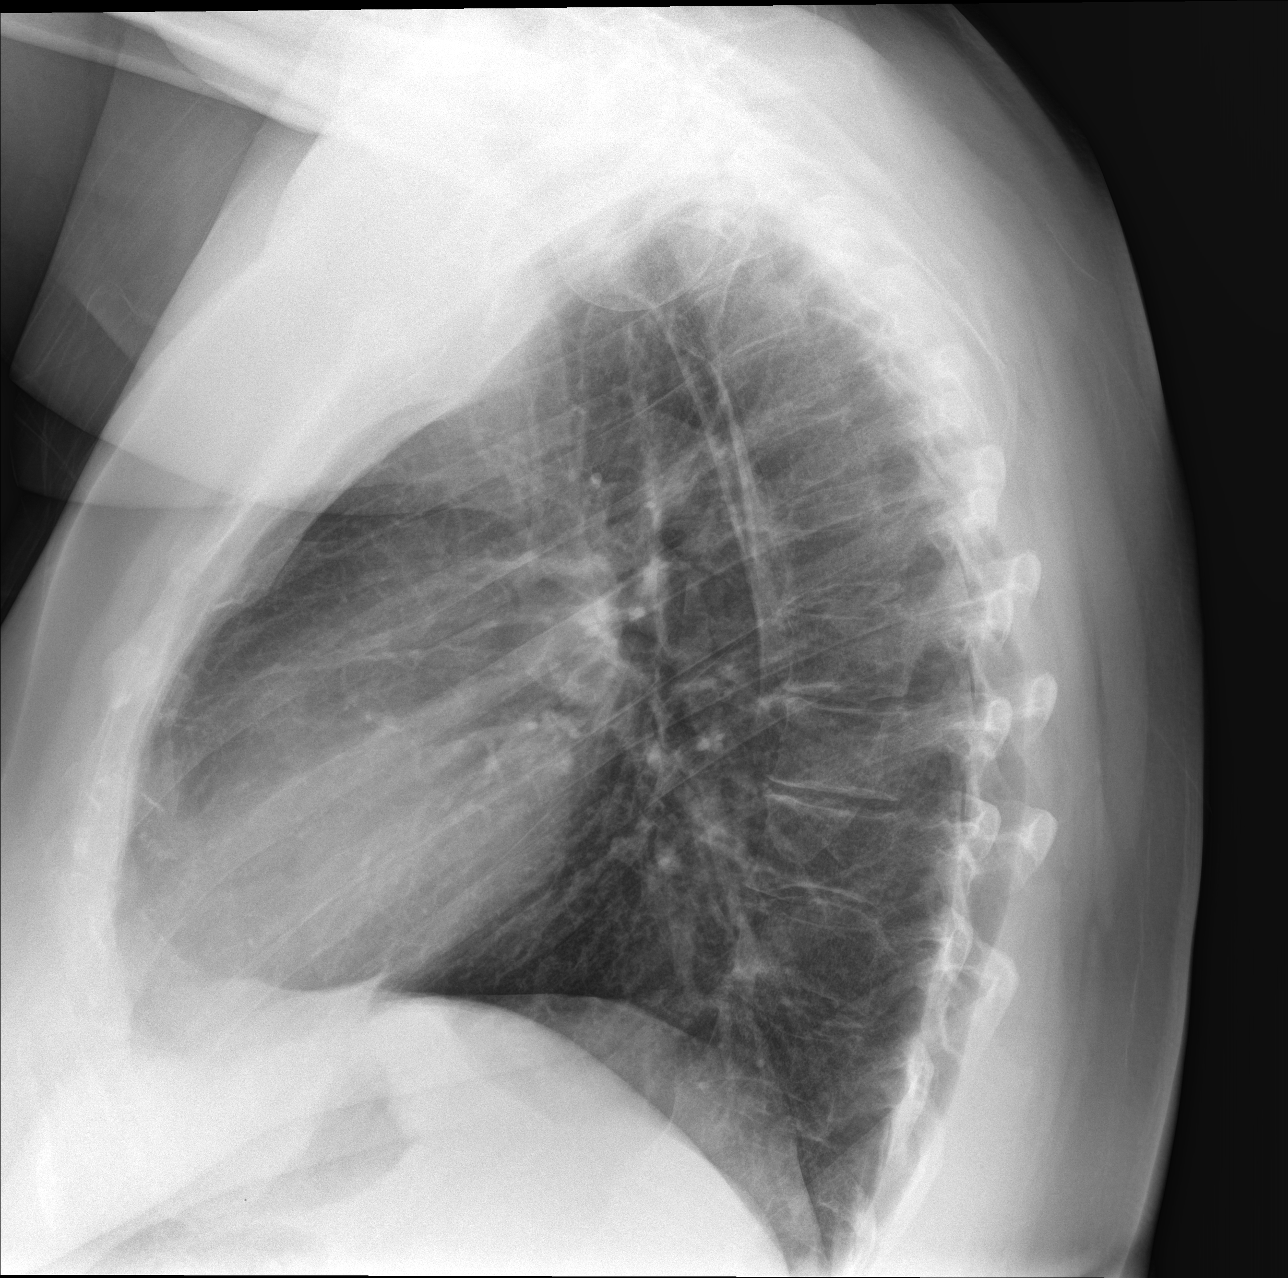

[chest lat (2 of 2)]
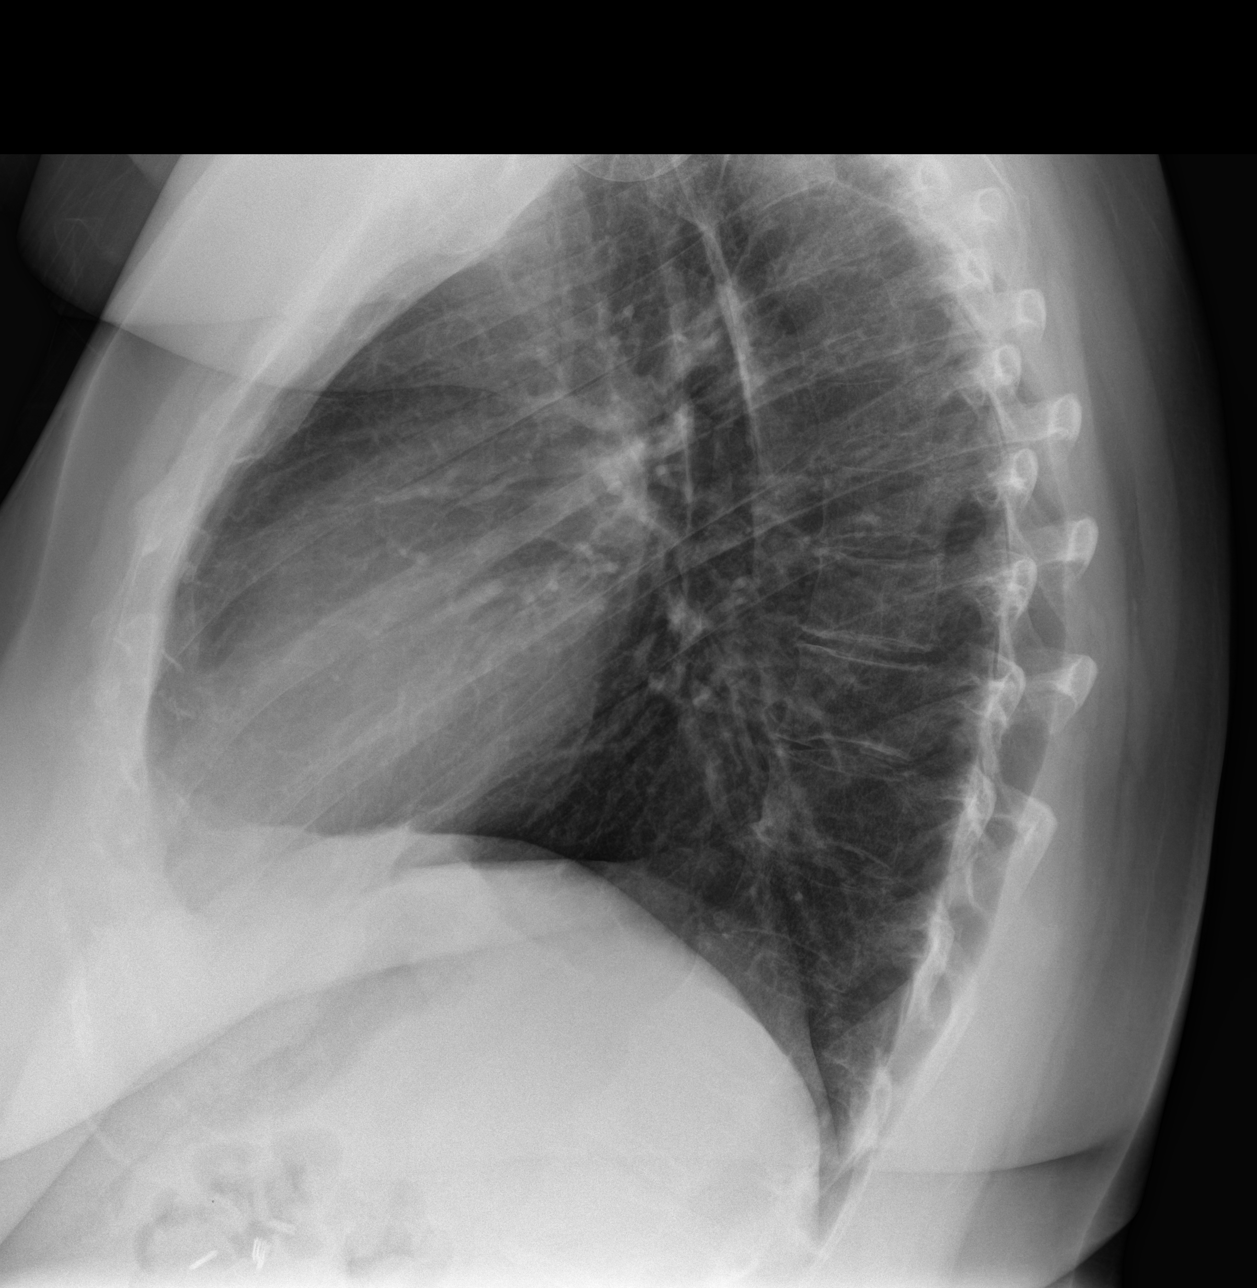

[3 of 3 positions shown; findings below may reference images not displayed]

FINDINGS: Normal heart size, mediastinal contours, and pulmonary vascularity.

No pleural effusion or pneumothorax.

Lungs clear.

Dextroconvex thoracic scoliosis again noted.

No acute abnormalities.
IMPRESSION: No active cardiopulmonary disease.

## 2018-03-06 ENCOUNTER — Ambulatory Visit
Admission: EM | Admit: 2018-03-06 | Discharge: 2018-03-06 | Disposition: A | Discharge status: Home / Self Care | Payer: BLUE CROSS/BLUE SHIELD | Attending: Family Medicine | Admitting: Family Medicine

## 2018-03-06 ENCOUNTER — Other Ambulatory Visit: Payer: Self-pay

## 2018-03-06 DIAGNOSIS — J0101 Acute recurrent maxillary sinusitis: Secondary | ICD-10-CM | POA: Diagnosis not present

## 2018-03-06 DIAGNOSIS — B9789 Other viral agents as the cause of diseases classified elsewhere: Secondary | ICD-10-CM | POA: Diagnosis not present

## 2018-03-06 MED ORDER — DOXYCYCLINE HYCLATE 100 MG PO CAPS: 100.0000 mg | ORAL_CAPSULE | Freq: Two times a day (BID) | ORAL | 0 refills | Status: DC

## 2018-03-06 NOTE — ED Provider Notes (Signed)
[facility] ____________________________________________  Time seen: Approximately 11:33 AM  I have reviewed the triage vital signs and the nursing notes.   HISTORY  Chief Complaint Appointment; Facial Pain; and Dental Pain   HPI Amanda Humphrey is a 53 y.o. female presenting for evaluation of postnasal drainage, sinus congestion and sinus pressure present for this past week.  States worsened over the last few days.  States that she has recurrent sinusitis, particularly after flying and traveling.  States she did just fly this past week.  Previously followed by ENT.  Denies fevers.  States feels consistent with previous sinusitis.  States some discomfort towards her right ear as well as pressure in forehead, but mostly in her cheeks.  Does have some radiation down into her teeth, but denies any dental pain.  No accompanying fevers.  Continues to eat and drink well.  Unresolved with over-the-counter congestion medication.  Reports otherwise doing well.  Denies chest pain or shortness of breath.  Sherlene Shamsullo, Teresa L, MD: PCP   Past Medical History:  Diagnosis Date  . Allergy   . Chronic kidney disease    stones  . Fibrocystic breast disease 2013  . Meniere disease   . Migraines     Patient Active Problem List   Diagnosis Date Noted  . Fatigue 09/23/2017  . History of pneumonia 04/30/2016  . Grief reaction 04/30/2016  . Vitamin D deficiency 02/22/2015  . Hyperlipidemia 01/01/2015  . Visit for preventive health examination 12/04/2013  . Lower extremity edema 09/02/2013  . Essential hypertension 09/02/2013  . History of cardiomyopathy 09/02/2013  . SVT (supraventricular tachycardia) (HCC) 09/02/2013  . Palpitations 09/02/2013  . Contrast media allergy 03/10/2013  . Fibrocystic breast disease   . Obesity (BMI 30-39.9) 02/05/2012  . Meniere disease     Past Surgical History:  Procedure Laterality Date  . ABDOMINAL HYSTERECTOMY  2011   laprascopic supracervical,  DeFrancesco  . APPENDECTOMY  1999  . BREAST BIOPSY Right Nov 2012    fibrocystic disease, Michela Pitcherly  . CESAREAN SECTION     4 total  . CHOLECYSTECTOMY  2011  . TONSILLECTOMY AND ADENOIDECTOMY  1975     No current facility-administered medications for this encounter.   Current Outpatient Medications:  .  carvedilol (COREG) 6.25 MG tablet, TAKE 1 TABLET BY MOUTH 2 TIMES DAILY., Disp: 180 tablet, Rfl: 3 .  doxycycline (VIBRAMYCIN) 100 MG capsule, Take 1 capsule (100 mg total) by mouth 2 (two) times daily., Disp: 20 capsule, Rfl: 0 .  furosemide (LASIX) 20 MG tablet, Take 1 tablet (20 mg total) by mouth daily as needed., Disp: 90 tablet, Rfl: 3 .  hydrochlorothiazide (HYDRODIURIL) 25 MG tablet, Take 1 tablet (25 mg total) by mouth daily., Disp: 90 tablet, Rfl: 3 .  meloxicam (MOBIC) 15 MG tablet, Take 1 tablet (15 mg total) by mouth daily., Disp: 30 tablet, Rfl: 0 .  methocarbamol (ROBAXIN) 500 MG tablet, Take 1 tablet (500 mg total) by mouth 2 (two) times daily., Disp: 90 tablet, Rfl: 3 .  Vitamin D, Ergocalciferol, (DRISDOL) 50000 units CAPS capsule, Take 50,000 Units by mouth every 7 (seven) days., Disp: , Rfl:   Allergies Codeine; Hydrocodone; Tramadol; and Oxycodone  Family History  Problem Relation Age of Onset  . Hypertension Mother   . Cancer Mother        ovarian  . Arthritis Mother   . Hyperlipidemia Father   . Hypertension Father   . Diabetes Father   . Stroke Father   .  Alzheimer's disease Maternal Grandmother   . Heart disease Maternal Grandmother   . Breast cancer Maternal Grandmother   . Alzheimer's disease Paternal Grandmother   . Breast cancer Paternal Grandmother     Social History Social History   Tobacco Use  . Smoking status: Never Smoker  . Smokeless tobacco: Never Used  Substance Use Topics  . Alcohol use: Yes    Comment: social  . Drug use: No    Review of Systems Constitutional: No fever Eyes: No visual changes.  Denies drainage from eye. ENT:  No sore throat. As above.  Cardiovascular: Denies chest pain. Respiratory: Denies shortness of breath. Gastrointestinal: No abdominal pain.  Musculoskeletal: Negative for back pain. Skin: Negative for rash.   ____________________________________________   PHYSICAL EXAM:  VITAL SIGNS: ED Triage Vitals  Enc Vitals Group     BP 03/06/18 1110 122/66     Pulse Rate 03/06/18 1110 65     Resp 03/06/18 1110 16     Temp 03/06/18 1110 97.7 F (36.5 C)     Temp Source 03/06/18 1110 Oral     SpO2 03/06/18 1110 100 %     Weight 03/06/18 1109 230 lb (104.3 kg)     Height 03/06/18 1109 5\' 8"  (1.727 m)     Head Circumference --      Peak Flow --      Pain Score 03/06/18 1109 3     Pain Loc --      Pain Edu? --      Excl. in GC? --     Constitutional: Alert and oriented. Well appearing and in no acute distress. Eyes: Conjunctivae are normal.  Head: Atraumatic.Mild to moderate tenderness to palpation bilateral maxillary sinuses R>L, minimal right frontal sinus are as per patient, no left frontal sinus tenderness.  No swelling. No erythema.   Ears: no erythema, normal TMs bilaterally.   Nose: nasal congestion with bilateral nasal turbinate erythema and edema.   Mouth/Throat: Mucous membranes are moist.  Oropharynx non-erythematous.No tonsillar swelling or exudate.  Neck: No stridor.  No cervical spine tenderness to palpation. Hematological/Lymphatic/Immunilogical: No cervical lymphadenopathy. Cardiovascular: Normal rate, regular rhythm. Grossly normal heart sounds.  Good peripheral circulation. Respiratory: Normal respiratory effort.  No retractions. No wheezes, rales or rhonchi. Good air movement.  Musculoskeletal: Steady gait Neurologic:  Normal speech and language. No gross focal neurologic deficits are appreciated. No gait instability. Skin:  Skin is warm, dry and intact. No rash noted. Psychiatric: Mood and affect are normal. Speech and behavior are  normal. ___________________________________________   LABS (all labs ordered are listed, but only abnormal results are displayed)  Labs Reviewed - No data to display  PROCEDURES   INITIAL IMPRESSION / ASSESSMENT AND PLAN / ED COURSE  Pertinent labs & imaging results that were available during my care of the patient were reviewed by me and considered in my medical decision making (see chart for details).  Well-appearing patient.  No acute distress.  Suspect sinusitis.  Will treat with oral doxycycline.  Encourage supportive care.  Also encourage patient to follow back up with ENT as she reports this has been more recurrent.Discussed indication, risks and benefits of medications with patient.  Discussed follow up with Primary care physician this week. Discussed follow up and return parameters including no resolution or any worsening concerns. Patient verbalized understanding and agreed to plan.   ____________________________________________   FINAL CLINICAL IMPRESSION(S) / ED DIAGNOSES  Final diagnoses:  Recurrent maxillary sinusitis     ED  Discharge Orders         Ordered    doxycycline (VIBRAMYCIN) 100 MG capsule  2 times daily     03/06/18 1131           Note: This dictation was prepared with Dragon dictation along with smaller phrase technology. Any transcriptional errors that result from this process are unintentional.         Renford Dills, NP 03/06/18 1304

## 2018-03-06 NOTE — ED Triage Notes (Signed)
Pt with facial pressure and pain, right facial swelling and tenderness, teeth pain. Pain 3/10

## 2018-03-06 NOTE — Discharge Instructions (Addendum)
Take medication as prescribed. Rest. Drink plenty of fluids.  Continue supportive care. ° °Follow up with your primary care physician this week as needed. Return to Urgent care for new or worsening concerns.  ° °

## 2018-03-24 ENCOUNTER — Other Ambulatory Visit: Payer: Self-pay

## 2018-03-24 ENCOUNTER — Ambulatory Visit
Admission: EM | Admit: 2018-03-24 | Discharge: 2018-03-24 | Disposition: A | Discharge status: Home / Self Care | Payer: BLUE CROSS/BLUE SHIELD | Attending: Family Medicine | Admitting: Family Medicine

## 2018-03-24 ENCOUNTER — Encounter: Payer: Self-pay | Admitting: Emergency Medicine

## 2018-03-24 DIAGNOSIS — J029 Acute pharyngitis, unspecified: Secondary | ICD-10-CM

## 2018-03-24 DIAGNOSIS — H9203 Otalgia, bilateral: Secondary | ICD-10-CM | POA: Diagnosis not present

## 2018-03-24 DIAGNOSIS — H6503 Acute serous otitis media, bilateral: Secondary | ICD-10-CM

## 2018-03-24 DIAGNOSIS — R0981 Nasal congestion: Secondary | ICD-10-CM

## 2018-03-24 LAB — RAPID STREP SCREEN (MED CTR MEBANE ONLY): Streptococcus, Group A Screen (Direct): NEGATIVE

## 2018-03-24 MED ORDER — AMOXICILLIN 875 MG PO TABS: 875.0000 mg | ORAL_TABLET | Freq: Two times a day (BID) | ORAL | 0 refills | Status: DC

## 2018-03-24 NOTE — ED Provider Notes (Signed)
[facility]    CSN: [id number] Arrival date & time: 03/24/18  1414     History   Chief Complaint Chief Complaint  Patient presents with  . Sore Throat  . Otalgia    bilateral worse on left    HPI Amanda Humphrey is a 53 y.o. female.   The history is provided by the patient.  Sore Throat   Otalgia  Associated symptoms: congestion, rhinorrhea and sore throat   URI  Presenting symptoms: congestion, ear pain, rhinorrhea and sore throat   Severity:  Moderate Onset quality:  Sudden Duration:  3 days Timing:  Constant Progression:  Worsening Chronicity:  New Relieved by:  None tried Ineffective treatments:  None tried Associated symptoms: no sinus pain and no wheezing   Risk factors: sick contacts     Past Medical History:  Diagnosis Date  . Allergy   . Chronic kidney disease    stones  . Fibrocystic breast disease 2013  . Meniere disease   . Migraines     Patient Active Problem List   Diagnosis Date Noted  . Fatigue 09/23/2017  . History of pneumonia 04/30/2016  . Grief reaction 04/30/2016  . Vitamin D deficiency 02/22/2015  . Hyperlipidemia 01/01/2015  . Visit for preventive health examination 12/04/2013  . Lower extremity edema 09/02/2013  . Essential hypertension 09/02/2013  . History of cardiomyopathy 09/02/2013  . SVT (supraventricular tachycardia) (HCC) 09/02/2013  . Palpitations 09/02/2013  . Contrast media allergy 03/10/2013  . Fibrocystic breast disease   . Obesity (BMI 30-39.9) 02/05/2012  . Meniere disease     Past Surgical History:  Procedure Laterality Date  . ABDOMINAL HYSTERECTOMY  2011   laprascopic supracervical, DeFrancesco  . APPENDECTOMY  1999  . BREAST BIOPSY Right Nov 2012    fibrocystic disease, Michela Pitcher  . CESAREAN SECTION     4 total  . CHOLECYSTECTOMY  2011  . TONSILLECTOMY AND ADENOIDECTOMY  1975    OB History   No obstetric history on file.      Home Medications    Prior to Admission medications     Medication Sig Start Date End Date Taking? Authorizing Provider  carvedilol (COREG) 6.25 MG tablet TAKE 1 TABLET BY MOUTH 2 TIMES DAILY. 09/22/17  Yes Sherlene Shams, MD  furosemide (LASIX) 20 MG tablet Take 1 tablet (20 mg total) by mouth daily as needed. 09/25/16  Yes Antonieta Iba, MD  hydrochlorothiazide (HYDRODIURIL) 25 MG tablet Take 1 tablet (25 mg total) by mouth daily. 09/22/17  Yes Sherlene Shams, MD  methocarbamol (ROBAXIN) 500 MG tablet Take 1 tablet (500 mg total) by mouth 2 (two) times daily. 12/14/17  Yes Sherlene Shams, MD  Vitamin D, Ergocalciferol, (DRISDOL) 50000 units CAPS capsule Take 50,000 Units by mouth every 7 (seven) days.   Yes [provider]  amoxicillin (AMOXIL) 875 MG tablet Take 1 tablet (875 mg total) by mouth 2 (two) times daily. 03/24/18   Payton Mccallum, MD  doxycycline (VIBRAMYCIN) 100 MG capsule Take 1 capsule (100 mg total) by mouth 2 (two) times daily. 03/06/18   Renford Dills, NP  meloxicam (MOBIC) 15 MG tablet Take 1 tablet (15 mg total) by mouth daily. 11/01/17   Sudie Grumbling, NP    Family History Family History  Problem Relation Age of Onset  . Hypertension Mother   . Cancer Mother        ovarian  . Arthritis Mother   . Hyperlipidemia Father   .  Hypertension Father   . Diabetes Father   . Stroke Father   . Alzheimer's disease Maternal Grandmother   . Heart disease Maternal Grandmother   . Breast cancer Maternal Grandmother   . Alzheimer's disease Paternal Grandmother   . Breast cancer Paternal Grandmother     Social History Social History   Tobacco Use  . Smoking status: Never Smoker  . Smokeless tobacco: Never Used  Substance Use Topics  . Alcohol use: Yes    Comment: social  . Drug use: No     Allergies   Codeine; Hydrocodone; Tramadol; and Oxycodone   Review of Systems Review of Systems  HENT: Positive for congestion, ear pain, rhinorrhea and sore throat. Negative for sinus pain.   Respiratory: Negative  for wheezing.      Physical Exam Triage Vital Signs ED Triage Vitals  Enc Vitals Group     BP 03/24/18 1440 134/74     Pulse Rate 03/24/18 1440 66     Resp 03/24/18 1440 18     Temp 03/24/18 1440 98 F (36.7 C)     Temp Source 03/24/18 1440 Oral     SpO2 03/24/18 1440 100 %     Weight 03/24/18 1437 226 lb (102.5 kg)     Height 03/24/18 1437 5\' 8"  (1.727 m)     Head Circumference --      Peak Flow --      Pain Score 03/24/18 1437 4     Pain Loc --      Pain Edu? --      Excl. in GC? --    No data found.  Updated Vital Signs BP 134/74 (BP Location: Left Arm)   Pulse 66   Temp 98 F (36.7 C) (Oral)   Resp 18   Ht 5\' 8"  (1.727 m)   Wt 102.5 kg   SpO2 100%   BMI 34.36 kg/m   Visual Acuity Right Eye Distance:   Left Eye Distance:   Bilateral Distance:    Right Eye Near:   Left Eye Near:    Bilateral Near:     Physical Exam Vitals signs and nursing note reviewed.  Constitutional:      General: She is not in acute distress.    Appearance: She is well-developed. She is not toxic-appearing or diaphoretic.  HENT:     Head: Normocephalic and atraumatic.     Right Ear: Ear canal and external ear normal. Tympanic membrane is erythematous and bulging.     Left Ear: Ear canal and external ear normal. Tympanic membrane is erythematous and bulging.     Nose: Congestion and rhinorrhea present.     Mouth/Throat:     Pharynx: Uvula midline. No oropharyngeal exudate.  Neck:     Musculoskeletal: Normal range of motion and neck supple.     Thyroid: No thyromegaly.  Cardiovascular:     Rate and Rhythm: Normal rate and regular rhythm.     Heart sounds: Normal heart sounds.  Pulmonary:     Effort: Pulmonary effort is normal. No respiratory distress.     Breath sounds: Normal breath sounds. No stridor. No wheezing, rhonchi or rales.  Lymphadenopathy:     Cervical: No cervical adenopathy.  Neurological:     Mental Status: She is alert.      UC Treatments / Results    Labs (all labs ordered are listed, but only abnormal results are displayed) Labs Reviewed  RAPID STREP SCREEN ([facility] ONLY)  CULTURE, GROUP  A STREP [facility])    EKG None  Radiology No results found.  Procedures Procedures (including critical care time)  Medications Ordered in UC Medications - No data to display  Initial Impression / Assessment and Plan / UC Course  I have reviewed the triage vital signs and the nursing notes.  Pertinent labs & imaging results that were available during my care of the patient were reviewed by me and considered in my medical decision making (see chart for details).      Final Clinical Impressions(s) / UC Diagnoses   Final diagnoses:  Bilateral acute serous otitis media, recurrence not specified    ED Prescriptions    Medication Sig Dispense Auth. Provider   amoxicillin (AMOXIL) 875 MG tablet Take 1 tablet (875 mg total) by mouth 2 (two) times daily. 20 tablet Payton Mccallumonty, Hurman Ketelsen, MD     1. Lab result and diagnosis reviewed with patient 2. rx as per orders above; reviewed possible side effects, interactions, risks and benefits  3. Follow-up prn if symptoms worsen or don't improve   Controlled Substance Prescriptions [place] Controlled Substance Registry consulted? Not Applicable   Payton Mccallumonty, Olamide Lahaie, MD 03/24/18 [phone]

## 2018-03-24 NOTE — ED Triage Notes (Signed)
Pt c/o sore throat, and bilateral ear pain left worse than right. She says the pain is worse when she swallows. Started  3 days ago.

## 2018-03-27 LAB — CULTURE, GROUP A STREP (THRC)

## 2018-06-04 ENCOUNTER — Telehealth: Payer: Self-pay

## 2018-06-04 NOTE — Telephone Encounter (Signed)
Spoke with patient to schedule overdue F/U with Dr. Mariah Milling. Patient declined telephone or video visit at this time but would like to F/U at a later date after precautions are lifted. She states she is working at [employer] and dealing with the virus precautions as well. Informed patient that recall will be placed for 08/28/2018.

## 2018-09-15 DIAGNOSIS — Z01419 Encounter for gynecological examination (general) (routine) without abnormal findings: Secondary | ICD-10-CM | POA: Diagnosis not present

## 2018-09-15 DIAGNOSIS — Z136 Encounter for screening for cardiovascular disorders: Secondary | ICD-10-CM | POA: Diagnosis not present

## 2018-09-15 DIAGNOSIS — Z1321 Encounter for screening for nutritional disorder: Secondary | ICD-10-CM | POA: Diagnosis not present

## 2018-09-15 DIAGNOSIS — Z124 Encounter for screening for malignant neoplasm of cervix: Secondary | ICD-10-CM | POA: Diagnosis not present

## 2018-09-15 DIAGNOSIS — F909 Attention-deficit hyperactivity disorder, unspecified type: Secondary | ICD-10-CM | POA: Diagnosis not present

## 2018-09-15 DIAGNOSIS — Z131 Encounter for screening for diabetes mellitus: Secondary | ICD-10-CM | POA: Diagnosis not present

## 2018-09-15 DIAGNOSIS — Z13220 Encounter for screening for lipoid disorders: Secondary | ICD-10-CM | POA: Diagnosis not present

## 2018-09-15 DIAGNOSIS — R946 Abnormal results of thyroid function studies: Secondary | ICD-10-CM | POA: Diagnosis not present

## 2018-09-22 ENCOUNTER — Ambulatory Visit (INDEPENDENT_AMBULATORY_CARE_PROVIDER_SITE_OTHER)
Admission: RE | Admit: 2018-09-22 | Discharge: 2018-09-22 | Disposition: A | Discharge status: Home / Self Care | Payer: BLUE CROSS/BLUE SHIELD | Source: Ambulatory Visit

## 2018-09-22 DIAGNOSIS — T63421A Toxic effect of venom of ants, accidental (unintentional), initial encounter: Secondary | ICD-10-CM

## 2018-09-22 DIAGNOSIS — L03116 Cellulitis of left lower limb: Secondary | ICD-10-CM | POA: Diagnosis not present

## 2018-09-22 MED ORDER — DOXYCYCLINE HYCLATE 100 MG PO CAPS: 100.0000 mg | ORAL_CAPSULE | Freq: Two times a day (BID) | ORAL | 0 refills | Status: DC

## 2018-09-22 NOTE — ED Provider Notes (Signed)
Amanda Humphrey     Virtual Visit via Video Note:  Amanda Humphrey  initiated request for Telemedicine visit with [facility] team. I connected with Amanda Humphrey  on 09/22/2018 at 3:02 PM  for a synchronized telemedicine visit using a video enabled HIPPA compliant telemedicine application. I verified that I am speaking with Amanda Humphrey  using two identifiers. Lestine Box, PA-C  was physically located in a [facility]gent care site and Amanda Humphrey was located at a different location.   The limitations of evaluation and management by telemedicine as well as the availability of in-person appointments were discussed. Patient was informed that she  may incur a bill ( including co-pay) for this virtual visit encounter. Amanda Humphrey  expressed understanding and gave verbal consent to proceed with virtual visit.   [id number] 09/22/18 Arrival Time: 7829  CC: Fire ant bites; concern for infection  SUBJECTIVE:  Amanda Humphrey is a 53 y.o. female who presents with fire ant bites to LLE that occurred 2 days ago.  Localizes the bite to LLE and circumference of left ankle.  Describes it as initially itchy, but now painful, red and swollen.  Patient mentions concern that one of the ant bites may be infected.  Has tried peroxide, neosporin, and hydrocortisone with minimal relief.  Denies aggravating factors.   Denies similar symptoms in the past.   Denies fever, chills, nausea, vomiting, discharge, SOB, chest pain, abdominal pain, changes in bowel or bladder function.    ROS: As per HPI.  All other pertinent ROS negative.     Past Medical History:  Diagnosis Date  . Allergy   . Chronic kidney disease    stones  . Fibrocystic breast disease 2013  . Meniere disease   . Migraines    Past Surgical History:  Procedure Laterality Date  . ABDOMINAL HYSTERECTOMY  2011   laprascopic supracervical, DeFrancesco  . APPENDECTOMY  1999  . BREAST BIOPSY Right Nov 2012   fibrocystic disease, Pat Patrick  . CESAREAN SECTION     4 total  . CHOLECYSTECTOMY  2011  . TONSILLECTOMY AND ADENOIDECTOMY  1975   Allergies  Allergen Reactions  . Codeine     Rash, Throat swelling,   . Hydrocodone Anaphylaxis  . Tramadol     Itching, facial swelling  . Oxycodone Rash   No current facility-administered medications on file prior to encounter.    Current Outpatient Medications on File Prior to Encounter  Medication Sig Dispense Refill  . carvedilol (COREG) 6.25 MG tablet TAKE 1 TABLET BY MOUTH 2 TIMES DAILY. 180 tablet 3  . furosemide (LASIX) 20 MG tablet Take 1 tablet (20 mg total) by mouth daily as needed. 90 tablet 3  . hydrochlorothiazide (HYDRODIURIL) 25 MG tablet Take 1 tablet (25 mg total) by mouth daily. 90 tablet 3  . meloxicam (MOBIC) 15 MG tablet Take 1 tablet (15 mg total) by mouth daily. 30 tablet 0  . methocarbamol (ROBAXIN) 500 MG tablet Take 1 tablet (500 mg total) by mouth 2 (two) times daily. 90 tablet 3  . Vitamin D, Ergocalciferol, (DRISDOL) 50000 units CAPS capsule Take 50,000 Units by mouth every 7 (seven) days.      OBJECTIVE: There were no vitals filed for this visit.  General appearance: alert; no distress Eyes: EOMI grossly HENT: normocephalic; atraumatic Neck: supple with FROM Lungs: normal respiratory effort; speaking in full sentences without difficulty Extremities: moves extremities without difficulty Skin: video cut out  prior to examination Neurologic: normal facial expressions; ambulates about environment without difficulty Psychological: alert and cooperative; normal mood and affect  ASSESSMENT & PLAN:  1. Fire ant bite, accidental or unintentional, initial encounter   2. Cellulitis of left lower extremity     Meds ordered this encounter  Medications  . doxycycline (VIBRAMYCIN) 100 MG capsule    Sig: Take 1 capsule (100 mg total) by mouth 2 (two) times daily.    Dispense:  20 capsule    Refill:  0    Order Specific Question:    Supervising Provider    Answer:   Eustace MooreELSON, YVONNE SUE [[id number]]   Wash with warm water and mild soap Prescribed doxycycline take as directed and to completion Continue to alternate ibuprofen and tylenol as needed for pain and fever Use OTC hydrocortisone and/or benadryl as needed for itching Follow up in person or with PCP if symptoms persists Follow up in person or go to the ED if you have any new or worsening symptoms such as increased pain, redness, swelling, discharge, high fever, night sweats, abdominal pain, etc...   I discussed the assessment and treatment plan with the patient. The patient was provided an opportunity to ask questions and all were answered. The patient agreed with the plan and demonstrated an understanding of the instructions.   The patient was advised to call back or seek an in-person evaluation if the symptoms worsen or if the condition fails to improve as anticipated.  I provided 11 minutes of non-face-to-face time during this encounter.  ColwellBrittany Beau Vanduzer, PA-C  09/22/2018 3:02 PM    Rennis HardingWurst, Dace Denn, PA-C 09/22/18 1504

## 2018-09-22 NOTE — Discharge Instructions (Addendum)
Wash with warm water and mild soap Prescribed doxycycline take as directed and to completion Continue to alternate ibuprofen and tylenol as needed for pain and fever Use OTC hydrocortisone and/or benadryl as needed for itching Follow up in person or with PCP if symptoms persists Follow up in person or go to the ED if you have any new or worsening symptoms such as increased pain, redness, swelling, discharge, high fever, night sweats, abdominal pain, etc..Marland Kitchen

## 2018-11-08 ENCOUNTER — Encounter: Payer: Self-pay | Admitting: Emergency Medicine

## 2018-11-08 ENCOUNTER — Ambulatory Visit
Admission: EM | Admit: 2018-11-08 | Discharge: 2018-11-08 | Disposition: A | Discharge status: Home / Self Care | Payer: BLUE CROSS/BLUE SHIELD

## 2018-11-08 ENCOUNTER — Other Ambulatory Visit: Payer: Self-pay

## 2018-11-08 DIAGNOSIS — S46811A Strain of other muscles, fascia and tendons at shoulder and upper arm level, right arm, initial encounter: Secondary | ICD-10-CM

## 2018-11-08 DIAGNOSIS — M5412 Radiculopathy, cervical region: Secondary | ICD-10-CM

## 2018-11-08 MED ORDER — KETOROLAC TROMETHAMINE 60 MG/2ML IM SOLN
60.0000 mg | Freq: Once | INTRAMUSCULAR | Status: AC
  Administered 2018-11-08: 60 mg via INTRAMUSCULAR

## 2018-11-08 MED ORDER — PREDNISONE 10 MG PO TABS: ORAL_TABLET | ORAL | 0 refills | Status: DC

## 2018-11-08 NOTE — Discharge Instructions (Signed)
Take medication as prescribed. Rest. Drink plenty of fluids. Muscle relaxant as discussed. Stretch.   Follow up with your primary care physician this week as needed. Return to Urgent care for new or worsening concerns.

## 2018-11-08 NOTE — ED Triage Notes (Signed)
Patient in office today stated that she woke up yesterday morning and right shoulder pain which radiates down to arm with numbness and tingling states its like she slept on it.   OTC: Ibu,heat,ice

## 2018-11-08 NOTE — ED Provider Notes (Signed)
[facility] ____________________________________________  Time seen: Approximately 9:18 AM  I have reviewed the triage vital signs and the nursing notes.   HISTORY  Chief Complaint Appointment and Shoulder Pain   HPI Amanda Humphrey is a 53 y.o. female presenting for evaluation of right shoulder and right arm complaints.  Patient reports yesterday morning she woke up with the pain to her right posterior shoulder that was very tight and sore, and reports as the day went on she felt some intermittent burning tingling type pain radiating down her arm.  Patient reports she has a chronic right wrist pain with intermittent "tennis elbow like "pain from previous injury and surgery, and she reports feels similar to the upper right arm.  Denies any fall, direct injury or direct trauma.  Denies any difficulties moving neck.  States able to fully move right shoulder but intermittently has catching pain.  States movement increases pain.  Rest helps.  Over-the-counter ibuprofen took throughout the day yesterday with minimal improvement.  Did also take Robaxin last night.  Denies any loss of range of motion, confusion, headache, chest pain, shortness of breath, other extremity changes or dizziness.  Reports otherwise doing well.  Denies recent sickness.  Sherlene Shams, MD: PCP   Past Medical History:  Diagnosis Date  . Allergy   . Chronic kidney disease    stones  . Fibrocystic breast disease 2013  . Meniere disease   . Migraines     Patient Active Problem List   Diagnosis Date Noted  . Fatigue 09/23/2017  . History of pneumonia 04/30/2016  . Grief reaction 04/30/2016  . Vitamin D deficiency 02/22/2015  . Hyperlipidemia 01/01/2015  . Visit for preventive health examination 12/04/2013  . Lower extremity edema 09/02/2013  . Essential hypertension 09/02/2013  . History of cardiomyopathy 09/02/2013  . SVT (supraventricular tachycardia) (HCC) 09/02/2013  . Palpitations 09/02/2013   . Contrast media allergy 03/10/2013  . Fibrocystic breast disease   . Obesity (BMI 30-39.9) 02/05/2012  . Meniere disease     Past Surgical History:  Procedure Laterality Date  . ABDOMINAL HYSTERECTOMY  2011   laprascopic supracervical, DeFrancesco  . APPENDECTOMY  1999  . BREAST BIOPSY Right Nov 2012    fibrocystic disease, Michela Pitcher  . CESAREAN SECTION     4 total  . CHOLECYSTECTOMY  2011  . TONSILLECTOMY AND ADENOIDECTOMY  1975     No current facility-administered medications for this encounter.   Current Outpatient Medications:  .  amphetamine-dextroamphetamine (ADDERALL) 10 MG tablet, , Disp: , Rfl:  .  amphetamine-dextroamphetamine (ADDERALL) 20 MG tablet, , Disp: , Rfl:  .  carvedilol (COREG) 6.25 MG tablet, TAKE 1 TABLET BY MOUTH 2 TIMES DAILY., Disp: 180 tablet, Rfl: 3 .  furosemide (LASIX) 20 MG tablet, Take 1 tablet (20 mg total) by mouth daily as needed., Disp: 90 tablet, Rfl: 3 .  hydrochlorothiazide (HYDRODIURIL) 25 MG tablet, Take 1 tablet (25 mg total) by mouth daily., Disp: 90 tablet, Rfl: 3 .  methocarbamol (ROBAXIN) 500 MG tablet, Take 1 tablet (500 mg total) by mouth 2 (two) times daily., Disp: 90 tablet, Rfl: 3 .  predniSONE (DELTASONE) 10 MG tablet, Start 60 mg po day one, then 50 mg po day two, taper by 10 mg daily until complete., Disp: 21 tablet, Rfl: 0 .  Vitamin D, Ergocalciferol, (DRISDOL) 50000 units CAPS capsule, Take 50,000 Units by mouth every 7 (seven) days., Disp: , Rfl:   Allergies Codeine, Hydrocodone, Tramadol, and Oxycodone  Family  History  Problem Relation Age of Onset  . Hypertension Mother   . Cancer Mother        ovarian  . Arthritis Mother   . Hyperlipidemia Father   . Hypertension Father   . Diabetes Father   . Stroke Father   . Alzheimer's disease Maternal Grandmother   . Heart disease Maternal Grandmother   . Breast cancer Maternal Grandmother   . Alzheimer's disease Paternal Grandmother   . Breast cancer Paternal Grandmother      Social History Social History   Tobacco Use  . Smoking status: Never Smoker  . Smokeless tobacco: Never Used  Substance Use Topics  . Alcohol use: Yes    Comment: social  . Drug use: No    Review of Systems Constitutional: No fever Cardiovascular: Denies chest pain. Respiratory: Denies shortness of breath. Gastrointestinal: No abdominal pain.   Musculoskeletal: Positive right shoulder pain.  Skin: Negative for rash. Neurological: Negative for headaches. ____________________________________________   PHYSICAL EXAM:  VITAL SIGNS: ED Triage Vitals  Enc Vitals Group     BP 11/08/18 0833 131/66     Pulse Rate 11/08/18 0833 73     Resp 11/08/18 0833 18     Temp 11/08/18 0833 98.5 F (36.9 C)     Temp Source 11/08/18 0833 Oral     SpO2 11/08/18 0833 100 %     Weight 11/08/18 0830 220 lb (99.8 kg)     Height --      Head Circumference --      Peak Flow --      Pain Score 11/08/18 0829 8     Pain Loc --      Pain Edu? --      Excl. in GC? --     Constitutional: Alert and oriented. Well appearing and in no acute distress. Eyes: Conjunctivae are normal.  ENT      Head: Normocephalic and atraumatic. Cardiovascular: Normal rate, regular rhythm. Grossly normal heart sounds.  Good peripheral circulation. Respiratory: Normal respiratory effort without tachypnea nor retractions. Breath sounds are clear and equal bilaterally. No wheezes, rales, rhonchi. Musculoskeletal:  Steady gait.  No midline cervical, thoracic or lumbar tenderness to palpation. Except: Point right trapezius tenderness to direct palpation with mild proximal right shoulder tenderness to palpation, no point bony tenderness, able to fully abduct and rotate shoulder with good strength, right upper extremity otherwise nontender, no paresthesias noted to upper extremities bilaterally, bilateral hand grip strong and equal, bilateral distal radial pulses equal. Neurologic:  Normal speech and language. No gross  focal neurologic deficits are appreciated. Speech is normal. No gait instability.  Skin:  Skin is warm, dry and intact. No rash noted. Psychiatric: Mood and affect are normal. Speech and behavior are normal. Patient exhibits appropriate insight and judgment   ___________________________________________   LABS (all labs ordered are listed, but only abnormal results are displayed)  Labs Reviewed - No data to display   PROCEDURES Procedures    INITIAL IMPRESSION / ASSESSMENT AND PLAN / ED COURSE  Pertinent labs & imaging results that were available during my care of the patient were reviewed by me and considered in my medical decision making (see chart for details).  Well-appearing patient.  No acute distress.  Suspect muscular strain with secondary radiculopathy.  Has no trauma no point bony tenderness will defer x-rays at this time.  Will treat with prednisone and her home Robaxin as needed.  60 mg IM Toradol given once in urgent care.  Heat, ice, stretching and supportive care.Discussed indication, risks and benefits of medications with patient.  Discussed follow up with Primary care physician this week as needed. Discussed follow up and return parameters including no resolution or any worsening concerns. Patient verbalized understanding and agreed to plan.   ____________________________________________   FINAL CLINICAL IMPRESSION(S) / ED DIAGNOSES  Final diagnoses:  Trapezius strain, right, initial encounter  Cervical radiculopathy     ED Discharge Orders         Ordered    predniSONE (DELTASONE) 10 MG tablet     11/08/18 0856           Note: This dictation was prepared with Dragon dictation along with smaller phrase technology. Any transcriptional errors that result from this process are unintentional.         Marylene Land, NP 11/08/18 [phone]

## 2018-11-17 ENCOUNTER — Encounter: Payer: Self-pay | Admitting: Internal Medicine

## 2018-11-17 ENCOUNTER — Ambulatory Visit (INDEPENDENT_AMBULATORY_CARE_PROVIDER_SITE_OTHER): Payer: BLUE CROSS/BLUE SHIELD | Admitting: Internal Medicine

## 2018-11-17 VITALS — Ht 68.0 in | Wt 220.0 lb

## 2018-11-17 DIAGNOSIS — M5412 Radiculopathy, cervical region: Secondary | ICD-10-CM | POA: Diagnosis not present

## 2018-11-17 DIAGNOSIS — M4692 Unspecified inflammatory spondylopathy, cervical region: Secondary | ICD-10-CM | POA: Diagnosis not present

## 2018-11-17 DIAGNOSIS — Z1231 Encounter for screening mammogram for malignant neoplasm of breast: Secondary | ICD-10-CM

## 2018-11-17 MED ORDER — HYDROCHLOROTHIAZIDE 25 MG PO TABS: 25.0000 mg | ORAL_TABLET | Freq: Every day | ORAL | 3 refills | Status: DC

## 2018-11-17 MED ORDER — OMEPRAZOLE 40 MG PO CPDR: 40.0000 mg | DELAYED_RELEASE_CAPSULE | Freq: Every day | ORAL | 3 refills | Status: DC

## 2018-11-17 MED ORDER — FUROSEMIDE 20 MG PO TABS: 20.0000 mg | ORAL_TABLET | Freq: Every day | ORAL | 3 refills | Status: DC | PRN

## 2018-11-17 MED ORDER — PREDNISONE 10 MG PO TABS: ORAL_TABLET | ORAL | 0 refills | Status: DC

## 2018-11-17 MED ORDER — CARVEDILOL 6.25 MG PO TABS: ORAL_TABLET | ORAL | 3 refills | Status: DC

## 2018-11-17 MED ORDER — GABAPENTIN 100 MG PO CAPS: 100.0000 mg | ORAL_CAPSULE | Freq: Three times a day (TID) | ORAL | 3 refills | Status: DC

## 2018-11-17 NOTE — Assessment & Plan Note (Signed)
Stop nsaid.  rx prednisone 60 mgqd x 5,  Then taper  Tylenol and gabapentin rx'd.  Mri cervical spine to be done at [facility]  To rule out nerve root stenoses involving c6-8.

## 2018-11-17 NOTE — Progress Notes (Signed)
Virtual Visit via [email or web address]  This visit type was conducted due to national recommendations for restrictions regarding the COVID-19 pandemic (e.g. social distancing).  This format is felt to be most appropriate for this patient at this time.  All issues noted in this document were discussed and addressed.  No physical exam was performed (except for noted visual exam findings with Video Visits).   I connected with@ on 11/17/18 at  8:00 AM EDT by a video enabled telemedicine application or telephone and verified that I am speaking with the correct person using two identifiers. Location patient: home Location provider: work or home office Persons participating in the virtual visit: patient, provider  I discussed the limitations, risks, security and privacy concerns of performing an evaluation and management service by telephone and the availability of in person appointments. I also discussed with the patient that there may be a patient responsible charge related to this service. The patient expressed understanding and agreed to proceed.    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   Was treated by urgent care on oct 12 with prednisone taper, IM dose of 60 MG  toradol and MR.  Symptoms improved transeient by Day 3 of  prednisone but quickly returned on Day 5.  Has been using heat,  MR and  ibuprofen in large amounts with no improveent and now having esophagitis  . Pain in arm is electrical sensation   4th and 5th fingers numb.     ROS: See pertinent positives and negatives per HPI.  Past Medical History:  Diagnosis Date  . Allergy   . Chronic kidney disease    stones  . Fibrocystic breast disease 2013  . Meniere disease   . Migraines     Past Surgical History:  Procedure  Laterality Date  . ABDOMINAL HYSTERECTOMY  2011   laprascopic supracervical, DeFrancesco  . APPENDECTOMY  1999  . BREAST BIOPSY Right Nov 2012    fibrocystic disease, Pat Patrick  . CESAREAN SECTION     4 total  . CHOLECYSTECTOMY  2011  . TONSILLECTOMY AND ADENOIDECTOMY  1975    Family History  Problem Relation Age of Onset  . Hypertension Mother   . Cancer Mother        ovarian  . Arthritis Mother   . Hyperlipidemia Father   . Hypertension Father   . Diabetes Father   . Stroke Father   . Alzheimer's disease Maternal Grandmother   . Heart disease Maternal Grandmother   . Breast cancer Maternal Grandmother   . Alzheimer's disease Paternal Grandmother   . Breast cancer Paternal Grandmother     SOCIAL HX:  reports that she has never smoked. She has never used smokeless tobacco. She reports current alcohol use. She reports that she does not use drugs.   Current Outpatient Medications:  .  amphetamine-dextroamphetamine (ADDERALL) 10 MG tablet, , Disp: , Rfl:  .  amphetamine-dextroamphetamine (ADDERALL) 20 MG tablet, , Disp: , Rfl:  .  carvedilol (COREG) 6.25 MG tablet, TAKE 1 TABLET BY MOUTH 2 TIMES DAILY., Disp: 180 tablet, Rfl: 3 .  furosemide (LASIX) 20 MG tablet, Take 1 tablet (20 mg total) by mouth daily as needed., Disp: 90 tablet, Rfl: 3 .  hydrochlorothiazide (HYDRODIURIL) 25 MG tablet, Take 1 tablet (  25 mg total) by mouth daily., Disp: 90 tablet, Rfl: 3 .  methocarbamol (ROBAXIN) 500 MG tablet, Take 1 tablet (500 mg total) by mouth 2 (two) times daily., Disp: 90 tablet, Rfl: 3 .  gabapentin (NEURONTIN) 100 MG capsule, Take 1 capsule (100 mg total) by mouth 3 (three) times daily., Disp: 90 capsule, Rfl: 3 .  omeprazole (PRILOSEC) 40 MG capsule, Take 1 capsule (40 mg total) by mouth daily., Disp: 30 capsule, Rfl: 3 .  predniSONE (DELTASONE) 10 MG tablet, 6 tablets daily for 5 days,   , then reduce by 1 tablet daily until gone, Disp: 45 tablet, Rfl: 0  EXAM:  VITALS per patient  if applicable:  GENERAL: alert, oriented, appears well and in no acute distress  HEENT: atraumatic, conjunttiva clear, no obvious abnormalities on inspection of external nose and ears  NECK: normal movements of the head and neck  LUNGS: on inspection no signs of respiratory distress, breathing rate appears normal, no obvious gross SOB, gasping or wheezing  CV: no obvious cyanosis  MS: moves all visible extremities without noticeable abnormality  PSYCH/NEURO: pleasant and cooperative, no obvious depression or anxiety, speech and thought processing grossly intact  ASSESSMENT AND PLAN:  Discussed the following assessment and plan:  Encounter for screening mammogram for malignant neoplasm of breast - Plan: MM 3D SCREEN BREAST BILATERAL  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HCC) - Plan: predniSONE (DELTASONE) 10 MG tablet, omeprazole (PRILOSEC) 40 MG capsule, gabapentin (NEURONTIN) 100 MG capsule, MR Cervical Spine Wo Contrast, CANCELED: MR Cervical Spine Wo Contrast  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HCC) Stop nsaid.  rx prednisone 60 mgqd x 5,  Then taper  Tylenol and gabapentin rx'd.  Mri cervical spine to be done at [facility]  To rule out nerve root stenoses involving c6-8.     I discussed the assessment and treatment plan with the patient. The patient was provided an opportunity to ask questions and all were answered. The patient agreed with the plan and demonstrated an understanding of the instructions.   The patient was advised to call back or seek an in-person evaluation if the symptoms worsen or if the condition fails to improve as anticipated.   I provided  25 minutes of non-face-to-face time during this encounter reviewing patient's current problems and post surgeries.  Providing counseling on the above mentioned problems , and coordination  of care .

## 2018-11-17 NOTE — Progress Notes (Signed)
Pt stated that for 11 days now she has had neck and right shoulder pain that is now radiating down her right arm and causing her hand and fingers to feel numb.

## 2018-11-24 DIAGNOSIS — M4722 Other spondylosis with radiculopathy, cervical region: Secondary | ICD-10-CM | POA: Diagnosis not present

## 2018-11-24 DIAGNOSIS — M4802 Spinal stenosis, cervical region: Secondary | ICD-10-CM | POA: Diagnosis not present

## 2018-11-24 DIAGNOSIS — M4692 Unspecified inflammatory spondylopathy, cervical region: Secondary | ICD-10-CM | POA: Diagnosis not present

## 2018-11-24 DIAGNOSIS — M5013 Cervical disc disorder with radiculopathy, cervicothoracic region: Secondary | ICD-10-CM | POA: Diagnosis not present

## 2018-11-24 DIAGNOSIS — M5412 Radiculopathy, cervical region: Secondary | ICD-10-CM | POA: Diagnosis not present

## 2018-11-25 ENCOUNTER — Encounter: Payer: Self-pay | Admitting: Internal Medicine

## 2018-11-26 DIAGNOSIS — M542 Cervicalgia: Secondary | ICD-10-CM | POA: Diagnosis not present

## 2018-11-26 DIAGNOSIS — M79601 Pain in right arm: Secondary | ICD-10-CM | POA: Diagnosis not present

## 2018-11-26 DIAGNOSIS — M5010 Cervical disc disorder with radiculopathy, unspecified cervical region: Secondary | ICD-10-CM | POA: Diagnosis not present

## 2018-11-30 ENCOUNTER — Telehealth: Payer: Self-pay | Admitting: Internal Medicine

## 2018-11-30 DIAGNOSIS — M4692 Unspecified inflammatory spondylopathy, cervical region: Secondary | ICD-10-CM

## 2018-12-30 ENCOUNTER — Other Ambulatory Visit: Payer: Self-pay

## 2018-12-30 DIAGNOSIS — M4692 Unspecified inflammatory spondylopathy, cervical region: Secondary | ICD-10-CM

## 2018-12-30 DIAGNOSIS — M5412 Radiculopathy, cervical region: Secondary | ICD-10-CM

## 2018-12-30 MED ORDER — OMEPRAZOLE 40 MG PO CPDR: 40.0000 mg | DELAYED_RELEASE_CAPSULE | Freq: Every day | ORAL | 3 refills | Status: DC

## 2019-01-10 NOTE — Progress Notes (Signed)
Cardiology Office Note  Date:  01/11/2019   ID:  JAYDIN JALOMO, DOB May 31, 1965, MRN [id number]  PCP:  Crecencio Mc, MD   Chief Complaint  Patient presents with  . Other    Patient deneis chest pain and SOB at this time. Meds reviewed verbally with patient.     HPI:  Ms. Carelli is a very pleasant 53 year old woman with remote history of cardiomyopathy approximately 18 years ago after having her third child with mild depression of her ejection fraction which returned back to normal 6 months later,  periodic SVT, palpitations,  lower extremity swelling  who presents for routine follow-up of her arrhythmia, hypertension  Doing well, working remotely during Covid BP well controlled, tolerating medications well with no recent changes Mild swelling in the legs, worse if sitting for prolonged periods of time, rare lasix use  Woke up one morning recently with numb arm and hand Work-up revealing C7-T1 disk herniation DJD of surrounding vertebrea Scheduled for surgery, posterior approach, Jan 5th Completed course of prednisone mildly relief but still very symptomatic Using hot packs  Lab work reviewed, cholesterol 230 some family history of coronary artery disease  EKG shows no sinus rhythm with rate 74 bpm, right bundle branch block, no change from prior EKG    other past medical history  Prior history of cardiomyopathy many years ago that seemed to significantly improve with aggressive diuresis. Approximately 18 years ago, Lasix was provided with 16 pound weight loss and improvement of her symptoms. On her fourth child she had fluid retention but no recurrent cardiomyopathy  prior echocardiogram 12/16/2010 showing normal ejection fraction rate and 55%, mild MR   PMH:   has a past medical history of Allergy, Chronic kidney disease, Fibrocystic breast disease (2013), Meniere disease, and Migraines.  PSH:    Past Surgical History:  Procedure Laterality Date  . ABDOMINAL  HYSTERECTOMY  2011   laprascopic supracervical, DeFrancesco  . APPENDECTOMY  1999  . BREAST BIOPSY Right Nov 2012    fibrocystic disease, Pat Patrick  . CESAREAN SECTION     4 total  . CHOLECYSTECTOMY  2011  . TONSILLECTOMY AND ADENOIDECTOMY  1975    Current Outpatient Medications  Medication Sig Dispense Refill  . amphetamine-dextroamphetamine (ADDERALL) 10 MG tablet     . amphetamine-dextroamphetamine (ADDERALL) 20 MG tablet     . carvedilol (COREG) 6.25 MG tablet TAKE 1 TABLET BY MOUTH 2 TIMES DAILY. 180 tablet 3  . furosemide (LASIX) 20 MG tablet Take 1 tablet (20 mg total) by mouth daily as needed. 90 tablet 3  . hydrochlorothiazide (HYDRODIURIL) 25 MG tablet Take 1 tablet (25 mg total) by mouth daily. 90 tablet 3  . methocarbamol (ROBAXIN) 500 MG tablet Take 1 tablet (500 mg total) by mouth 2 (two) times daily. 90 tablet 3  . omeprazole (PRILOSEC) 40 MG capsule Take 1 capsule (40 mg total) by mouth daily. 30 capsule 3   No current facility-administered medications for this visit.     Allergies:   Codeine, Hydrocodone, Tramadol, and Oxycodone   Social History:  The patient  reports that she has never smoked. She has never used smokeless tobacco. She reports current alcohol use. She reports that she does not use drugs.   Family History:   family history includes Alzheimer's disease in her maternal grandmother and paternal grandmother; Arthritis in her mother; Breast cancer in her maternal grandmother and paternal grandmother; Cancer in her mother; Diabetes in her father; Heart disease in her maternal grandmother;  Hyperlipidemia in her father; Hypertension in her father and mother; Stroke in her father.    Review of Systems: Review of Systems  Constitutional: Negative.   HENT: Negative.   Respiratory: Negative.   Cardiovascular: Negative.   Gastrointestinal: Negative.   Musculoskeletal: Negative.   Neurological: Negative.   Psychiatric/Behavioral: Negative.   All other systems  reviewed and are negative.   PHYSICAL EXAM: VS:  BP 132/88 (BP Location: Left Arm, Patient Position: Sitting, Cuff Size: Normal)   Pulse 74   Ht 5' 8.5" (1.74 m)   Wt 230 lb (104.3 kg)   SpO2 95%   BMI 34.46 kg/m  , BMI Body mass index is 34.46 kg/m. Constitutional:  oriented to person, place, and time. No distress.  HENT:  Head: Grossly normal Eyes:  no discharge. No scleral icterus.  Neck: No JVD, no carotid bruits  Cardiovascular: Regular rate and rhythm, no murmurs appreciated Pulmonary/Chest: Clear to auscultation bilaterally, no wheezes or rails Abdominal: Soft.  no distension.  no tenderness.  Musculoskeletal: Normal range of motion Neurological:  normal muscle tone. Coordination normal. No atrophy Skin: Skin warm and dry Psychiatric: normal affect, pleasant   Recent Labs: No results found for requested labs within last 8760 hours.    Lipid Panel Lab Results  Component Value Date   CHOL 233 (H) 04/29/2016   HDL 57.00 04/29/2016   LDLCALC 146 (H) 04/29/2016   TRIG 150.0 (H) 04/29/2016      Wt Readings from Last 3 Encounters:  01/11/19 230 lb (104.3 kg)  11/17/18 220 lb (99.8 kg)  11/08/18 220 lb (99.8 kg)       ASSESSMENT AND PLAN:  Preop cardiovascular evaluation Scheduled for neck surgery January 5 at [facility] Acceptable risk, no further work-up needed Suggested she take carvedilol morning of, hold HCTZ --Would suggest we try to minimize/moderate IV fluids perioperatively  Lasix as needed  SVT (supraventricular tachycardia) (HCC) No recent episodes, well controlled on beta-blocker Continue Coreg, extra doses if needed  Essential hypertension Blood pressure well controlled, no changes made Stable lab work  Mixed hyperlipidemia Previously on Crestor, no longer on her last Total cholesterol 230 Discussed lifestyle modification, she reports that she recently started keto diet -Discussed screening options for risk stratification such as CT coronary  calcium scoring.  She is interested and will call us when she is ready next year  Obesity (BMI 30-39.9) We have encouraged continued exercise, careful diet management in an effort to lose weight.  Lower extremity edema Minimal swelling on today's visit Compression hose and Lasix as needed   Total encounter time more than 25 minutes  Greater than 50% was spent in counseling and coordination of care with the patient   Disposition:   F/U 12 months   Orders Placed This Encounter  Procedures  . EKG 12-Lead     Signed, Dossie Arbour, M.D., Ph.D. 01/11/2019  [facility] [place], [place] [phone]

## 2019-01-11 ENCOUNTER — Other Ambulatory Visit: Payer: Self-pay

## 2019-01-11 ENCOUNTER — Ambulatory Visit: Payer: BLUE CROSS/BLUE SHIELD | Admitting: Cardiovascular Disease

## 2019-01-11 ENCOUNTER — Encounter: Payer: Self-pay | Admitting: *Deleted

## 2019-01-11 ENCOUNTER — Encounter: Payer: Self-pay | Admitting: Cardiovascular Disease

## 2019-01-11 VITALS — BP 132/88 | HR 74 | Ht 68.5 in | Wt 230.0 lb

## 2019-01-11 DIAGNOSIS — I471 Supraventricular tachycardia: Secondary | ICD-10-CM | POA: Diagnosis not present

## 2019-01-11 DIAGNOSIS — E782 Mixed hyperlipidemia: Secondary | ICD-10-CM | POA: Diagnosis not present

## 2019-01-11 DIAGNOSIS — I10 Essential (primary) hypertension: Secondary | ICD-10-CM | POA: Diagnosis not present

## 2019-01-11 MED ORDER — CARVEDILOL 6.25 MG PO TABS: ORAL_TABLET | ORAL | 3 refills | Status: DC

## 2019-01-11 MED ORDER — FUROSEMIDE 20 MG PO TABS: 20.0000 mg | ORAL_TABLET | Freq: Every day | ORAL | 3 refills | Status: DC | PRN

## 2019-01-11 MED ORDER — HYDROCHLOROTHIAZIDE 25 MG PO TABS: 25.0000 mg | ORAL_TABLET | Freq: Every day | ORAL | 3 refills | Status: DC

## 2019-01-11 NOTE — Patient Instructions (Signed)
Call if you even would like a CT coronary calcium score $150,    Medication Instructions:  No changes  If you need a refill on your cardiac medications before your next appointment, please call your pharmacy.    Lab work: No new labs needed   If you have labs (blood work) drawn today and your tests are completely normal, you will receive your results only by: Marland Kitchen MyChart Message (if you have MyChart) OR . A paper copy in the mail If you have any lab test that is abnormal or we need to change your treatment, we will call you to review the results.   Testing/Procedures: No new testing needed   Follow-Up: At [facility], you and your health needs are our priority.  As part of our continuing mission to provide you with exceptional heart care, we have created designated Provider Care Teams.  These Care Teams include your primary Cardiologist (physician) and Advanced Practice Providers (APPs -  Physician Assistants and Nurse Practitioners) who all work together to provide you with the care you need, when you need it.  . You will need a follow up appointment in 12 months   . Providers on your designated Care Team:   . Murray Hodgkins, NP . Christell Faith, PA-C . Marrianne Mood, PA-C  Any Other Special Instructions Will Be Listed Below (If Applicable).  For educational health videos Log in to : [email or web address] Or : [email or web address], password : [email or web address]

## 2019-01-12 DIAGNOSIS — M47812 Spondylosis without myelopathy or radiculopathy, cervical region: Secondary | ICD-10-CM | POA: Diagnosis not present

## 2019-01-12 DIAGNOSIS — M5010 Cervical disc disorder with radiculopathy, unspecified cervical region: Secondary | ICD-10-CM | POA: Diagnosis not present

## 2019-01-18 DIAGNOSIS — Z01818 Encounter for other preprocedural examination: Secondary | ICD-10-CM | POA: Diagnosis not present

## 2019-01-18 DIAGNOSIS — M5010 Cervical disc disorder with radiculopathy, unspecified cervical region: Secondary | ICD-10-CM | POA: Diagnosis not present

## 2019-01-18 DIAGNOSIS — Z86718 Personal history of other venous thrombosis and embolism: Secondary | ICD-10-CM | POA: Diagnosis not present

## 2019-01-18 DIAGNOSIS — K219 Gastro-esophageal reflux disease without esophagitis: Secondary | ICD-10-CM | POA: Diagnosis not present

## 2019-01-18 DIAGNOSIS — R002 Palpitations: Secondary | ICD-10-CM | POA: Diagnosis not present

## 2019-01-18 DIAGNOSIS — I4510 Unspecified right bundle-branch block: Secondary | ICD-10-CM | POA: Diagnosis not present

## 2019-01-18 DIAGNOSIS — E785 Hyperlipidemia, unspecified: Secondary | ICD-10-CM | POA: Diagnosis not present

## 2019-01-18 DIAGNOSIS — F988 Other specified behavioral and emotional disorders with onset usually occurring in childhood and adolescence: Secondary | ICD-10-CM | POA: Diagnosis not present

## 2019-01-18 DIAGNOSIS — Z8679 Personal history of other diseases of the circulatory system: Secondary | ICD-10-CM | POA: Diagnosis not present

## 2019-01-18 DIAGNOSIS — I10 Essential (primary) hypertension: Secondary | ICD-10-CM | POA: Diagnosis not present

## 2019-01-27 ENCOUNTER — Telehealth: Payer: Self-pay | Admitting: Cardiovascular Disease

## 2019-01-27 NOTE — Telephone Encounter (Signed)
Spoke with patient and she states that she has multiple EKG's that are similar but they do want provider to review for preop. She denies any changes since last visit and he is just wanting him to compare and review. She is going to send them through [facility]. Her procedure is on Tuesday and advised I would send him message for review.

## 2019-01-27 NOTE — Telephone Encounter (Signed)
Dr Rico Sheehan nurse calling Preop did an EKG for patient's upcoming surgery on Tuesday 02/01/19 and it was abnormal Office will be faxing EKG and would like for Dr Rockey Situ to review and advise  Please call Mickel Baas, Dr Rico Sheehan nurse to discuss at [phone]

## 2019-01-28 NOTE — Telephone Encounter (Signed)
Can we contact the surgical team at [facility] She has procedure Tuesday morning EKG unchanged Has been the same the past 8 years We have multiple EKGs on file with consistent findings Acceptable risk for procedure, no further testing needed Thx tGollan

## 2019-01-29 DIAGNOSIS — Z01818 Encounter for other preprocedural examination: Secondary | ICD-10-CM | POA: Diagnosis not present

## 2019-01-29 DIAGNOSIS — Z20828 Contact with and (suspected) exposure to other viral communicable diseases: Secondary | ICD-10-CM | POA: Diagnosis not present

## 2019-01-31 NOTE — Telephone Encounter (Signed)
Spoke with patient to inform her that we received a confirmation that Dr. Pierre Bali office received the EKG and note from Dr. Mariah Milling regarding her upcoming surgery.

## 2019-02-01 DIAGNOSIS — M5031 Other cervical disc degeneration,  high cervical region: Secondary | ICD-10-CM | POA: Diagnosis not present

## 2019-02-01 DIAGNOSIS — M5010 Cervical disc disorder with radiculopathy, unspecified cervical region: Secondary | ICD-10-CM | POA: Diagnosis not present

## 2019-02-01 DIAGNOSIS — O903 Peripartum cardiomyopathy: Secondary | ICD-10-CM | POA: Diagnosis not present

## 2019-02-01 DIAGNOSIS — R110 Nausea: Secondary | ICD-10-CM | POA: Diagnosis not present

## 2019-02-01 DIAGNOSIS — M5013 Cervical disc disorder with radiculopathy, cervicothoracic region: Secondary | ICD-10-CM | POA: Diagnosis not present

## 2019-02-01 DIAGNOSIS — F988 Other specified behavioral and emotional disorders with onset usually occurring in childhood and adolescence: Secondary | ICD-10-CM | POA: Diagnosis not present

## 2019-02-01 DIAGNOSIS — Z86718 Personal history of other venous thrombosis and embolism: Secondary | ICD-10-CM | POA: Diagnosis not present

## 2019-02-01 DIAGNOSIS — L299 Pruritus, unspecified: Secondary | ICD-10-CM | POA: Diagnosis not present

## 2019-02-01 DIAGNOSIS — Z79899 Other long term (current) drug therapy: Secondary | ICD-10-CM | POA: Diagnosis not present

## 2019-02-01 DIAGNOSIS — M47812 Spondylosis without myelopathy or radiculopathy, cervical region: Secondary | ICD-10-CM | POA: Diagnosis not present

## 2019-02-01 DIAGNOSIS — M4722 Other spondylosis with radiculopathy, cervical region: Secondary | ICD-10-CM | POA: Diagnosis not present

## 2019-02-01 DIAGNOSIS — M4802 Spinal stenosis, cervical region: Secondary | ICD-10-CM | POA: Diagnosis not present

## 2019-02-01 DIAGNOSIS — I951 Orthostatic hypotension: Secondary | ICD-10-CM | POA: Diagnosis not present

## 2019-02-01 DIAGNOSIS — K5900 Constipation, unspecified: Secondary | ICD-10-CM | POA: Diagnosis not present

## 2019-02-01 DIAGNOSIS — I10 Essential (primary) hypertension: Secondary | ICD-10-CM | POA: Diagnosis not present

## 2019-02-01 DIAGNOSIS — Z452 Encounter for adjustment and management of vascular access device: Secondary | ICD-10-CM | POA: Diagnosis not present

## 2019-02-01 DIAGNOSIS — I4510 Unspecified right bundle-branch block: Secondary | ICD-10-CM | POA: Diagnosis not present

## 2019-02-01 DIAGNOSIS — Z888 Allergy status to other drugs, medicaments and biological substances status: Secondary | ICD-10-CM | POA: Diagnosis not present

## 2019-02-01 DIAGNOSIS — I9581 Postprocedural hypotension: Secondary | ICD-10-CM | POA: Diagnosis not present

## 2019-02-01 DIAGNOSIS — K219 Gastro-esophageal reflux disease without esophagitis: Secondary | ICD-10-CM | POA: Diagnosis not present

## 2019-02-08 MED ORDER — POLYETHYLENE GLYCOL 3350 17 GM/SCOOP PO POWD
17.00 | ORAL | Status: DC
Start: 2019-02-08 — End: 2019-02-08

## 2019-02-08 MED ORDER — ENOXAPARIN SODIUM 40 MG/0.4ML ~~LOC~~ SOLN
40.00 | SUBCUTANEOUS | Status: DC
Start: 2019-02-08 — End: 2019-02-08

## 2019-02-08 MED ORDER — GENERIC EXTERNAL MEDICATION
Status: DC
Start: ? — End: 2019-02-08

## 2019-02-08 MED ORDER — DIAZEPAM 2 MG PO TABS
2.00 | ORAL_TABLET | ORAL | Status: DC
Start: ? — End: 2019-02-08

## 2019-02-08 MED ORDER — CARVEDILOL 6.25 MG PO TABS
6.25 | ORAL_TABLET | ORAL | Status: DC
Start: ? — End: 2019-02-08

## 2019-02-08 MED ORDER — MELATONIN 3 MG PO TABS
3.00 | ORAL_TABLET | ORAL | Status: DC
Start: 2019-02-08 — End: 2019-02-08

## 2019-02-08 MED ORDER — SENNOSIDES-DOCUSATE SODIUM 8.6-50 MG PO TABS
2.00 | ORAL_TABLET | ORAL | Status: DC
Start: 2019-02-08 — End: 2019-02-08

## 2019-02-08 MED ORDER — DEXAMETHASONE 4 MG PO TABS
4.00 | ORAL_TABLET | ORAL | Status: DC
Start: 2019-02-08 — End: 2019-02-08

## 2019-02-08 MED ORDER — ACETAMINOPHEN 325 MG PO TABS
975.00 | ORAL_TABLET | ORAL | Status: DC
Start: 2019-02-08 — End: 2019-02-08

## 2019-02-08 MED ORDER — DEXTROSE 50 % IV SOLN
12.50 | INTRAVENOUS | Status: DC
Start: ? — End: 2019-02-08

## 2019-02-08 MED ORDER — HYDROCHLOROTHIAZIDE 25 MG PO TABS
25.00 | ORAL_TABLET | ORAL | Status: DC
Start: 2019-02-09 — End: 2019-02-08

## 2019-02-08 MED ORDER — QUINTABS PO TABS
1.00 | ORAL_TABLET | ORAL | Status: DC
Start: 2019-02-09 — End: 2019-02-08

## 2019-02-08 MED ORDER — FUROSEMIDE 20 MG PO TABS
20.00 | ORAL_TABLET | ORAL | Status: DC
Start: 2019-02-09 — End: 2019-02-08

## 2019-02-08 MED ORDER — BISACODYL 10 MG RE SUPP
10.00 | RECTAL | Status: DC
Start: 2019-02-09 — End: 2019-02-08

## 2019-02-08 MED ORDER — LIDOCAINE 5 % EX PTCH
2.00 | MEDICATED_PATCH | CUTANEOUS | Status: DC
Start: 2019-02-09 — End: 2019-02-08

## 2019-02-08 MED ORDER — GENERIC EXTERNAL MEDICATION
10.00 | Status: DC
Start: 2019-02-08 — End: 2019-02-08

## 2019-02-08 MED ORDER — GLUCAGON (RDNA) 1 MG IJ KIT
1.00 | PACK | INTRAMUSCULAR | Status: DC
Start: ? — End: 2019-02-08

## 2019-02-08 MED ORDER — CELECOXIB 200 MG PO CAPS
200.00 | ORAL_CAPSULE | ORAL | Status: DC
Start: 2019-02-08 — End: 2019-02-08

## 2019-02-08 MED ORDER — PANTOPRAZOLE SODIUM 40 MG PO TBEC
40.00 | DELAYED_RELEASE_TABLET | ORAL | Status: DC
Start: 2019-02-09 — End: 2019-02-08

## 2019-02-08 MED ORDER — GABAPENTIN 100 MG PO CAPS
200.00 | ORAL_CAPSULE | ORAL | Status: DC
Start: 2019-02-08 — End: 2019-02-08

## 2019-02-08 MED ORDER — GENERIC EXTERNAL MEDICATION
10.00 | Status: DC
Start: ? — End: 2019-02-08

## 2019-02-14 ENCOUNTER — Other Ambulatory Visit: Payer: Self-pay

## 2019-02-14 ENCOUNTER — Telehealth: Payer: Self-pay | Admitting: Internal Medicine

## 2019-02-14 ENCOUNTER — Ambulatory Visit (INDEPENDENT_AMBULATORY_CARE_PROVIDER_SITE_OTHER): Payer: BC Managed Care – PPO | Admitting: Internal Medicine

## 2019-02-14 ENCOUNTER — Encounter: Payer: Self-pay | Admitting: Internal Medicine

## 2019-02-14 DIAGNOSIS — M4692 Unspecified inflammatory spondylopathy, cervical region: Secondary | ICD-10-CM

## 2019-02-14 DIAGNOSIS — Z9889 Other specified postprocedural states: Secondary | ICD-10-CM

## 2019-02-14 DIAGNOSIS — R600 Localized edema: Secondary | ICD-10-CM | POA: Diagnosis not present

## 2019-02-14 DIAGNOSIS — M5412 Radiculopathy, cervical region: Secondary | ICD-10-CM

## 2019-02-14 DIAGNOSIS — I10 Essential (primary) hypertension: Secondary | ICD-10-CM | POA: Diagnosis not present

## 2019-02-14 NOTE — Progress Notes (Signed)
Virtual Visit via [email or web address]  This visit type was conducted due to national recommendations for restrictions regarding the COVID-19 pandemic (e.g. social distancing).  This format is felt to be most appropriate for this patient at this time.  All issues noted in this document were discussed and addressed.  No physical exam was performed (except for noted visual exam findings with Video Visits).   I connected with@ on 02/14/19 at  1:30 PM EST by a video enabled telemedicine application and verified that I am speaking with the correct person using two identifiers. Location patient: home Location provider: work or home office Persons participating in the virtual visit: patient, provider  I discussed the limitations, risks, security and privacy concerns of performing an evaluation and management service by telephone and the availability of in person appointments. I also discussed with the patient that there may be a patient responsible charge related to this service. The patient expressed understanding and agreed to proceed.  Reason for visit: follow up  HPI:  54 yr old female with history of cervical spondylosis with myelopathy recently s/p C7-T1 neurosurgery at [facility] . She underwent C7-T1 partial hemilaminectomy, diskectomy and proximal foraminotomy for management of degenerative changes that had caused deviation and compression of C7 causing right arm pain and weakness.  Her recovery was complicated by a Hypotensive episode after she was transitioned  from dilaudid/Fentanyl PA to oral dilaudid.   IV access was poor,  She required an IJ,   volume overload requiring diuresis .  Hospitalization was prolonged,  Was discharged after 5 days on Jan 10th without opioid for pain control due to history of rash with all available options.  Has been taking valium for muscle spasms,  Finished decadron  steroid taper,  Insurance balked on paying for celebrex but she received  one week's worth,   still having sever pain  despite taking Gabapentin 200 mg tid,  ,  Valium 2 mg every 4 hours  and tylenol 500 mg every 6 hours  . Pain is gradually improving with time but still  7/10  but has episodes of severe pain at the posterior incision site.    Husband providing care  Hospital could not procure home health aide due to [place]   Has a potty chair  Walker and neck  Brace   Has finally had a BM after multiple doses of laxative       ROS: See pertinent positives and negatives per HPI.  Past Medical History:  Diagnosis Date  . Allergy   . Chronic kidney disease    stones  . Fibrocystic breast disease 2013  . Meniere disease   . Migraines     Past Surgical History:  Procedure Laterality Date  . ABDOMINAL HYSTERECTOMY  2011   laprascopic supracervical, DeFrancesco  . APPENDECTOMY  1999  . BREAST BIOPSY Right Nov 2012    fibrocystic disease, Pat Patrick  . CESAREAN SECTION     4 total  . CHOLECYSTECTOMY  2011  . TONSILLECTOMY AND ADENOIDECTOMY  1975    Family History  Problem Relation Age of Onset  . Hypertension Mother   . Cancer Mother        ovarian  . Arthritis Mother   . Hyperlipidemia Father   . Hypertension Father   . Diabetes Father   . Stroke Father   . Alzheimer's disease Maternal Grandmother   . Heart disease Maternal Grandmother   . Breast cancer Maternal Grandmother   . Alzheimer's disease Paternal Grandmother   .  Breast cancer Paternal Grandmother     SOCIAL HX:  reports that she has never smoked. She has never used smokeless tobacco. She reports current alcohol use. She reports that she does not use drugs.   Current Outpatient Medications:  .  carvedilol (COREG) 6.25 MG tablet, TAKE 1 TABLET BY MOUTH 2 TIMES DAILY., Disp: 180 tablet, Rfl: 3 .  celecoxib (CELEBREX) 200 MG capsule, Take by mouth., Disp: , Rfl:  .  dexamethasone (DECADRON) 1 MG tablet, Take by mouth., Disp: , Rfl:  .  diazepam (VALIUM) 2 MG tablet, Take by mouth., Disp: , Rfl:  .  furosemide (LASIX) 20 MG  tablet, Take 1 tablet (20 mg total) by mouth daily as needed., Disp: 90 tablet, Rfl: 3 .  gabapentin (NEURONTIN) 100 MG capsule, Take by mouth., Disp: , Rfl:  .  hydrochlorothiazide (HYDRODIURIL) 25 MG tablet, Take 1 tablet (25 mg total) by mouth daily., Disp: 90 tablet, Rfl: 3 .  omeprazole (PRILOSEC) 40 MG capsule, Take 1 capsule (40 mg total) by mouth daily., Disp: 30 capsule, Rfl: 3 .  amphetamine-dextroamphetamine (ADDERALL) 10 MG tablet, , Disp: , Rfl:  .  amphetamine-dextroamphetamine (ADDERALL) 20 MG tablet, , Disp: , Rfl:  .  methocarbamol (ROBAXIN) 500 MG tablet, Take 1 tablet (500 mg total) by mouth 2 (two) times daily. (Patient not taking: Reported on 02/14/2019), Disp: 90 tablet, Rfl: 3  EXAM:  VITALS per patient if applicable:  GENERAL: alert, oriented, appears well but in pain,  Wearing a neck brace (soft collar)   HEENT: atraumatic, conjunttiva clear, no obvious abnormalities on inspection of external nose and ears  NECK: normal movements of the head and neck  LUNGS: on inspection no signs of respiratory distress, breathing rate appears normal, no obvious gross SOB, gasping or wheezing  CV: no obvious cyanosis  MS: moves all visible extremities without noticeable abnormality  PSYCH/NEURO: pleasant and cooperative, no obvious depression or anxiety, speech and thought processing grossly intact  ASSESSMENT AND PLAN:  Discussed the following assessment and plan:  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HCC)  Essential hypertension  Lower extremity edema  H/O of hemilaminectomy  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HCC) S/p extensive surgical decompression at [facility] complicated by hypotension, fluid overload and uncontrolled pain due to history of allergic reaction to all available options except dilaudid, which caused hypotension.  Increase gabapentin to 300 mg tid,  Increase diazepam for muscle spams to 3 mg every 4 to 6 hours.  Already taking tylenol 2000 mg daily .   Surgery follow up this week   Essential hypertension She has resumed carvedilol and hctz with elevated readings currently attributed to post operative pain.  No changes today   Lower extremity edema Recent volume overload, iatrogenic,  Use lasix prn until  weight is at baseline     I discussed the assessment and treatment plan with the patient. The patient was provided an opportunity to ask questions and all were answered. The patient agreed with the plan and demonstrated an understanding of the instructions.   The patient was advised to call back or seek an in-person evaluation if the symptoms worsen or if the condition fails to improve as anticipated.   I provided  40 minutes of non-face-to-face time during this encounter reviewing patient's current problems , recent hospitalization and surgery at [facility] , providing counseling on the above mentioned problems , and coordination  of care . Sherlene Shams, MD

## 2019-02-15 DIAGNOSIS — Z9889 Other specified postprocedural states: Secondary | ICD-10-CM | POA: Insufficient documentation

## 2019-02-15 NOTE — Assessment & Plan Note (Signed)
Recent volume overload, iatrogenic,  Use lasix prn until  weight is at baseline

## 2019-02-15 NOTE — Assessment & Plan Note (Signed)
S/p extensive surgical decompression at [facility] complicated by hypotension, fluid overload and uncontrolled pain due to history of allergic reaction to all available options except dilaudid, which caused hypotension.  Increase gabapentin to 300 mg tid,  Increase diazepam for muscle spams to 3 mg every 4 to 6 hours.  Already taking tylenol 2000 mg daily .  Surgery follow up this week

## 2019-02-15 NOTE — Assessment & Plan Note (Signed)
She has resumed carvedilol and hctz with elevated readings currently attributed to post operative pain.  No changes today

## 2019-02-16 DIAGNOSIS — M5412 Radiculopathy, cervical region: Secondary | ICD-10-CM | POA: Diagnosis not present

## 2019-02-16 DIAGNOSIS — Z9889 Other specified postprocedural states: Secondary | ICD-10-CM | POA: Diagnosis not present

## 2019-02-16 DIAGNOSIS — M4722 Other spondylosis with radiculopathy, cervical region: Secondary | ICD-10-CM | POA: Diagnosis not present

## 2019-02-16 DIAGNOSIS — M5011 Cervical disc disorder with radiculopathy,  high cervical region: Secondary | ICD-10-CM | POA: Diagnosis not present

## 2019-02-16 DIAGNOSIS — M79605 Pain in left leg: Secondary | ICD-10-CM | POA: Diagnosis not present

## 2019-02-16 DIAGNOSIS — Z09 Encounter for follow-up examination after completed treatment for conditions other than malignant neoplasm: Secondary | ICD-10-CM | POA: Diagnosis not present

## 2019-03-07 ENCOUNTER — Other Ambulatory Visit: Payer: Self-pay | Admitting: Internal Medicine

## 2019-03-07 DIAGNOSIS — Z09 Encounter for follow-up examination after completed treatment for conditions other than malignant neoplasm: Secondary | ICD-10-CM | POA: Diagnosis not present

## 2019-03-07 DIAGNOSIS — M5412 Radiculopathy, cervical region: Secondary | ICD-10-CM

## 2019-03-07 DIAGNOSIS — Z9889 Other specified postprocedural states: Secondary | ICD-10-CM | POA: Diagnosis not present

## 2019-03-07 DIAGNOSIS — M4692 Unspecified inflammatory spondylopathy, cervical region: Secondary | ICD-10-CM

## 2019-03-07 NOTE — Progress Notes (Signed)
Cardiology Office Note  Date:  03/08/2019   ID:  Amanda Humphrey, DOB 1965/02/24, MRN [id number]  PCP:  Sherlene Shams, MD   Chief Complaint  Patient presents with  . other    Pt. c/o swelling all over and dizziness. Meds reviewed by the pt. verbally.      HPI:  Amanda Humphrey is a very pleasant 54 year old woman with remote history of cardiomyopathy approximately 18 years ago after having her third child with mild depression of her ejection fraction which returned back to normal 6 months later,  periodic SVT, palpitations,  lower extremity swelling  who presents for routine follow-up of her arrhythmia, hypertension, fluid retention following neck surgery January 2021  [facility] records from neurosurgery reviewed status post right C7-T1 discectomy and foraminotomy/hemilaminectomy February 08, 2019 Performed at [facility] procedure orthostatic hypotension, treated with fluid boluses Taking Valium, gabapentin for muscle spasms Recent office note from neurosurgery indicates she is on Lasix Working with physical therapy, initially using a walker for stability Residual numbness right hand, Improvement in grip and dexterity C8 nerve distribution And ability to tolerate narcotics  post decompression nerve root edema accentuated by postop fluid boluses for postop postural hypotension and apparent allergic reaction to Dilaudid and some rebound swelling upon completion of Decadron taper. Was discharged on carvedilol 6.25 twice daily, HCTZ 25, Lasix 20 as needed, Adderall, gabapentin  High fluid intake, on lasix 40 daily Leg swelling, SOB sweepingh in recliner for comforty, muscles locking up Back on meds coreg, HCTZ, lasix 40,   Orthostatics: 150/83, rate 84 supine 139/82, rate 91 bpm sitting 127/83, rate 94 bpm, standing 0 min 138/86, rate 96 bpm standing 3 min   EKG shows no sinus rhythm with rate 79 bpm, right bundle branch block, no change from prior EKG    other past medical history  Prior  history of cardiomyopathy many years ago that seemed to significantly improve with aggressive diuresis. Approximately 18 years ago, Lasix was provided with 16 pound weight loss and improvement of her symptoms. On her fourth child she had fluid retention but no recurrent cardiomyopathy  prior echocardiogram 12/16/2010 showing normal ejection fraction rate and 55%, mild MR   PMH:   has a past medical history of Allergy, Chronic kidney disease, Fibrocystic breast disease (2013), Meniere disease, and Migraines.  PSH:    Past Surgical History:  Procedure Laterality Date  . ABDOMINAL HYSTERECTOMY  2011   laprascopic supracervical, DeFrancesco  . APPENDECTOMY  1999  . BREAST BIOPSY Right Nov 2012    fibrocystic disease, Michela Pitcher  . CESAREAN SECTION     4 total  . CHOLECYSTECTOMY  2011  . TONSILLECTOMY AND ADENOIDECTOMY  1975    Current Outpatient Medications  Medication Sig Dispense Refill  . amphetamine-dextroamphetamine (ADDERALL) 10 MG tablet Take 10 mg by mouth every morning.     Marland Kitchen amphetamine-dextroamphetamine (ADDERALL) 20 MG tablet Take 20 mg by mouth every evening.     . carvedilol (COREG) 6.25 MG tablet TAKE 1 TABLET BY MOUTH 2 TIMES DAILY. 180 tablet 3  . diazepam (VALIUM) 2 MG tablet Take 2 mg in the am & 4 mg in the pm.    . furosemide (LASIX) 40 MG tablet Take 40 mg by mouth daily.    Marland Kitchen gabapentin (NEURONTIN) 400 MG capsule Take 400 mg by mouth 3 (three) times daily.    . hydrochlorothiazide (HYDRODIURIL) 25 MG tablet Take 1 tablet (25 mg total) by mouth daily. 90 tablet 3  . omeprazole (PRILOSEC)  40 MG capsule Take 1 capsule (40 mg total) by mouth daily. 30 capsule 3  . methocarbamol (ROBAXIN) 500 MG tablet TAKE 1 TABLET TWICE A DAY (Patient not taking: Reported on 03/08/2019) 90 tablet 7   No current facility-administered medications for this visit.     Allergies:   Bee venom, Codeine, Hydrocodone, Tramadol, and Oxycodone   Social History:  The patient  reports that she has  never smoked. She has never used smokeless tobacco. She reports current alcohol use. She reports that she does not use drugs.   Family History:   family history includes Alzheimer's disease in her maternal grandmother and paternal grandmother; Arthritis in her mother; Breast cancer in her maternal grandmother and paternal grandmother; Cancer in her mother; Diabetes in her father; Heart disease in her maternal grandmother; Hyperlipidemia in her father; Hypertension in her father and mother; Stroke in her father.    Review of Systems: Review of Systems  Constitutional: Negative.   HENT: Negative.   Respiratory: Negative.   Cardiovascular: Negative.   Gastrointestinal: Negative.   Musculoskeletal: Negative.   Neurological: Negative.   Psychiatric/Behavioral: Negative.   All other systems reviewed and are negative.   PHYSICAL EXAM: VS:  BP 120/82 (BP Location: Left Arm, Patient Position: Sitting, Cuff Size: Normal)   Pulse 79   Ht 5\' 9"  (1.753 m)   Wt 247 lb (112 kg)   BMI 36.48 kg/m  , BMI Body mass index is 36.48 kg/m. Constitutional:  oriented to person, place, and time. No distress.  HENT:  Head: Grossly normal Eyes:  no discharge. No scleral icterus.  Neck: No JVD, no carotid bruits  Cardiovascular: Regular rate and rhythm, no murmurs appreciated Pulmonary/Chest: Clear to auscultation bilaterally, no wheezes or rails Abdominal: Soft.  no distension.  no tenderness.  Musculoskeletal: Normal range of motion Neurological:  normal muscle tone. Coordination normal. No atrophy Skin: Skin warm and dry Psychiatric: normal affect, pleasant  Recent Labs: No results found for requested labs within last 8760 hours.    Lipid Panel Lab Results  Component Value Date   CHOL 233 (H) 04/29/2016   HDL 57.00 04/29/2016   LDLCALC 146 (H) 04/29/2016   TRIG 150.0 (H) 04/29/2016      Wt Readings from Last 3 Encounters:  03/08/19 247 lb (112 kg)  02/14/19 230 lb (104.3 kg)  01/11/19  230 lb (104.3 kg)     ASSESSMENT AND PLAN:  Neck surgery Discussed recent hospitalization in detail, complications, a day hospital course Records reviewed in detail with her  SVT (supraventricular tachycardia) ([place]) Well-controlled on beta-blocker Continue Coreg,  No recent breakthrough arrhythmia  Essential hypertension Blood pressure is well controlled on today's visit. No changes made to the medications.  Mixed hyperlipidemia Was 82, consider screening study at a later date such as CT coronary calcium scoring to help guide management  Lower extremity edema Exacerbated by recent events, surgery January 2021 due to Had dramatic fluid resuscitation, 2 L during surgery, numerous liters following surgery in the setting of hypotension (side effect of Dilaudid). Lab work drawn today BNP and BMP Will likely need Lasix 40 twice daily for several days then down to Lasix 40 daily with moderating her fluid intake -Compression hose/low salt We will call her when today's lab work is available tomorrow or late today to help guide diuresis   Total encounter time more than 25 minutes  Greater than 50% was spent in counseling and coordination of care with the patient   Disposition:  F/U 3 months   Orders Placed This Encounter  Procedures  . B Nat Peptide  . Basic metabolic panel  . EKG 12-Lead     Signed, Dossie Arbour, M.D., Ph.D. 03/08/2019  [facility] [place], [place] [phone]

## 2019-03-08 ENCOUNTER — Encounter: Payer: Self-pay | Admitting: Cardiovascular Disease

## 2019-03-08 ENCOUNTER — Ambulatory Visit: Payer: BC Managed Care – PPO | Admitting: Cardiovascular Disease

## 2019-03-08 ENCOUNTER — Other Ambulatory Visit: Payer: Self-pay

## 2019-03-08 VITALS — BP 120/82 | HR 79 | Ht 69.0 in | Wt 247.0 lb

## 2019-03-08 DIAGNOSIS — I10 Essential (primary) hypertension: Secondary | ICD-10-CM

## 2019-03-08 DIAGNOSIS — I471 Supraventricular tachycardia: Secondary | ICD-10-CM

## 2019-03-08 DIAGNOSIS — E782 Mixed hyperlipidemia: Secondary | ICD-10-CM | POA: Diagnosis not present

## 2019-03-08 MED ORDER — OMEPRAZOLE 40 MG PO CPDR: 40.0000 mg | DELAYED_RELEASE_CAPSULE | Freq: Every day | ORAL | 1 refills | Status: DC

## 2019-03-08 NOTE — Patient Instructions (Addendum)
Medication Instructions:  Decrease fluid intake Stay on lasix 40 daily Labs pending We might need lasix 40 twice a day for a few days Weight up 17 pounds, likely fluid  If you need a refill on your cardiac medications before your next appointment, please call your pharmacy.    Lab work: BNP and BMP today   If you have labs (blood work) drawn today and your tests are completely normal, you will receive your results only by: Marland Kitchen MyChart Message (if you have MyChart) OR . A paper copy in the mail If you have any lab test that is abnormal or we need to change your treatment, we will call you to review the results.   Testing/Procedures: No new testing needed   Follow-Up: At [facility], you and your health needs are our priority.  As part of our continuing mission to provide you with exceptional heart care, we have created designated Provider Care Teams.  These Care Teams include your primary Cardiologist (physician) and Advanced Practice Providers (APPs -  Physician Assistants and Nurse Practitioners) who all work together to provide you with the care you need, when you need it.  . You will need a follow up appointment in 3  months   . Providers on your designated Care Team:   . Nicolasa Ducking, NP . Eula Listen, PA-C . Marisue Ivan, PA-C  Any Other Special Instructions Will Be Listed Below (If Applicable).  For educational health videos Log in to : [email or web address] Or : [email or web address], password : [email or web address]

## 2019-03-10 LAB — BRAIN NATRIURETIC PEPTIDE: BNP: 34.2 pg/mL (ref 0.0–100.0)

## 2019-03-10 LAB — BASIC METABOLIC PANEL

## 2019-03-11 NOTE — Telephone Encounter (Signed)
Called [facility].  Spoke with [place].  Shanda Bumps stated that there was no SST tube that was sent to the branch.  I made Shanda Bumps aware that we write on the Req what we sent to [facility].  She pulled up the Req and stated that it does states that we sent an SST and a LAV tube.   Unfortunately, this looks like a mistake on [facility] end and it will have to be redrawn.

## 2019-03-14 ENCOUNTER — Other Ambulatory Visit: Payer: Self-pay | Admitting: *Deleted

## 2019-03-14 DIAGNOSIS — R002 Palpitations: Secondary | ICD-10-CM

## 2019-03-15 ENCOUNTER — Other Ambulatory Visit
Admission: RE | Admit: 2019-03-15 | Discharge: 2019-03-15 | Disposition: A | Discharge status: Home / Self Care | Payer: BC Managed Care – PPO | Attending: Cardiovascular Disease | Admitting: Cardiovascular Disease

## 2019-03-15 ENCOUNTER — Other Ambulatory Visit: Payer: Self-pay

## 2019-03-15 DIAGNOSIS — R002 Palpitations: Secondary | ICD-10-CM

## 2019-03-15 LAB — BASIC METABOLIC PANEL
Anion gap: 8 (ref 5–15)
BUN: 15 mg/dL (ref 6–20)
CO2: 29 mmol/L (ref 22–32)
Calcium: 9.5 mg/dL (ref 8.9–10.3)
Chloride: 98 mmol/L (ref 98–111)
Creatinine, Ser: 0.96 mg/dL (ref 0.44–1.00)
GFR calc Af Amer: >60 mL/min (ref >60)
GFR calc non Af Amer: >60 mL/min (ref >60)
Glucose, Bld: 90 mg/dL (ref 70–99)
Potassium: 2.9 mmol/L — ABNORMAL LOW (ref 3.5–5.1)
Sodium: 135 mmol/L (ref 135–145)

## 2019-03-16 ENCOUNTER — Telehealth: Payer: Self-pay | Admitting: *Deleted

## 2019-03-16 DIAGNOSIS — Z09 Encounter for follow-up examination after completed treatment for conditions other than malignant neoplasm: Secondary | ICD-10-CM | POA: Diagnosis not present

## 2019-03-16 DIAGNOSIS — Z9889 Other specified postprocedural states: Secondary | ICD-10-CM | POA: Diagnosis not present

## 2019-03-16 DIAGNOSIS — Z8679 Personal history of other diseases of the circulatory system: Secondary | ICD-10-CM

## 2019-03-16 DIAGNOSIS — R002 Palpitations: Secondary | ICD-10-CM

## 2019-03-16 MED ORDER — POTASSIUM CHLORIDE CRYS ER 20 MEQ PO TBCR: 20.0000 meq | EXTENDED_RELEASE_TABLET | Freq: Two times a day (BID) | ORAL | 3 refills | Status: DC

## 2019-03-16 MED ORDER — POTASSIUM CHLORIDE CRYS ER 20 MEQ PO TBCR: 20.0000 meq | EXTENDED_RELEASE_TABLET | Freq: Every day | ORAL | 3 refills | Status: DC

## 2019-03-16 NOTE — Telephone Encounter (Signed)
Spoke with patient and reviewed results and recommendations from Dr. Mariah Milling. Sent in prescription to pharmacy and she was agreeable to have repeat labs in [place]. She was appreciative for the call back with no further questions at this time.

## 2019-03-16 NOTE — Telephone Encounter (Signed)
Called pharmacy to clarify medication and instructions. She did say that if fax received for clarification to disregard. Provided her with verbal order clarification. She read back instructions, dosage, and frequency. Patient will take Potassium 20 mEq twice a day for 3 days then reduce to once daily. She updated prescription and had no further questions at this time.

## 2019-03-16 NOTE — Addendum Note (Signed)
Addended by: Bryna Colander on: 03/16/2019 03:07 PM   Modules accepted: Orders

## 2019-03-16 NOTE — Telephone Encounter (Signed)
-----   Message from Antonieta Iba, MD sent at 03/16/2019 12:40 PM EST ----- Potassium very low Needs potassium 20 meq Bid for three days The 20 daily Recheck BMP (at [facility] ok) 2-3 weeks

## 2019-03-18 ENCOUNTER — Ambulatory Visit: Payer: BC Managed Care – PPO | Attending: Internal Medicine | Admitting: Physical Therapy

## 2019-03-18 ENCOUNTER — Other Ambulatory Visit: Payer: Self-pay

## 2019-03-18 ENCOUNTER — Encounter: Payer: Self-pay | Admitting: Physical Therapy

## 2019-03-18 DIAGNOSIS — M2560 Stiffness of unspecified joint, not elsewhere classified: Secondary | ICD-10-CM | POA: Diagnosis not present

## 2019-03-18 DIAGNOSIS — M6281 Muscle weakness (generalized): Secondary | ICD-10-CM

## 2019-03-18 DIAGNOSIS — M79601 Pain in right arm: Secondary | ICD-10-CM

## 2019-03-18 NOTE — Therapy (Signed)
[place] [facility] [address]. [place], [place], [address] Phone: [phone]   Fax:  [phone]  Physical Therapy Evaluation  Patient Details  Name: AROUSH Humphrey MRN: [id number] Date of Birth: 1965/04/25 Referring Provider (PT): [place] Pink, NP   Encounter Date: 03/18/2019  PT End of Session - 03/18/19 1542    Visit Number  1    Number of Visits  9    Date for PT Re-Evaluation  04/15/19    Authorization - Visit Number  1    Authorization - Number of Visits  10    PT Start Time  1058    PT Stop Time  1212    PT Time Calculation (min)  74 min    Activity Tolerance  Patient limited by pain;Patient tolerated treatment well    Behavior During Therapy  [facility] for tasks assessed/performed       Past Medical History:  Diagnosis Date  . Allergy   . Chronic kidney disease    stones  . Fibrocystic breast disease 2013  . Meniere disease   . Migraines     Past Surgical History:  Procedure Laterality Date  . ABDOMINAL HYSTERECTOMY  2011   laprascopic supracervical, [place]  . APPENDECTOMY  1999  . BREAST BIOPSY Right Nov 2012    fibrocystic disease, Michela Pitcher  . CESAREAN SECTION     4 total  . CHOLECYSTECTOMY  2011  . TONSILLECTOMY AND ADENOIDECTOMY  1975    There were no vitals filed for this visit.   Subjective Assessment - 03/18/19 1253    Subjective  Pt. is a pleasant 54 y/o female S/P cervical discectomy surgery 6 weeks ago with c/o R>L sided neck pain with n/t in the R hand and spasms in bilateral UT/subocciptial/paraspinals. Pt. is currently experiencing pain: best 3/10, worst 8/10, now 6/10. Pt. reports that she works for [employer] and would like to be able to return to work on 3/8. Pt. 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 Pt's pain is aggravated by typing due to pins and needles in her hand, bending over, and rolling onto her R side. Pt. reports that she wears a cervical  collar to relieve pain because it "takes the weight off" and she often uses heat to alleviate her pain. Pt. is not taking any narcotics for pain control due to allergies. Pt. reports that she will often sleep with the soft collar to prevent pain when rolling in bed and when her neck feels fatigued.    Limitations  Lifting;House hold activities    Patient Stated Goals  Decrease muscle spasms, return to work    Currently in Pain?  Yes    Pain Score  6     Pain Location  Neck    Pain Orientation  Right;Left    Pain Descriptors / Indicators  Sharp;Spasm    Pain Type  Surgical pain    Pain Onset  More than a month ago    Pain Frequency  Constant    Aggravating Factors   Typing, Bending Over, Rolling onto the R    Pain Relieving Factors  Heat    Multiple Pain Sites  Yes    Pain Score  6    Pain Location  Hand    Pain Orientation  Right    Pain Descriptors / Indicators  Pins and needles    Pain Type  Surgical pain;Neuropathic pain  Pain Radiating Towards  Pain comes from the shoulder and radiates down the arm into the thumb and 1st two digits    Pain Onset  More than a month ago    Pain Frequency  Constant    Aggravating Factors   Typing    Effect of Pain on Daily Activities  Pt.'s job requires typing           SUBJECTIVE  Chief concern: R>L neck pain, n/t in R UE, spasms in R UT trap/suboccipitals/paraspinals Surgery Date: 02/01/2019 Pain: 6/10 Present, 3 /10 Best, 8/10 Worst Aggravating factors: Typing, bending over, rolling onto R shoulder Easing factors: Heat and propping head up/using cervical collar to reduce pressure/weight of head Numbness/Tingling: Yes, in the R thumb and sometimes in the 5th digit Follow-up appointment with MD: 04/04/2019 as needed Dominant hand: Right    OBJECTIVE SENSATION: Decreased sensation in the C6 - T2 dermatome on the R UE   MUSCULOSKELETAL: Tremor: None Tone: Increased muscle tone in bilateral UT and R rhomboids/medial border of  scapula  Posture  Pt. Presents with increased forward head posture, rounded shoulders, and increased thoracic kyphosis  Palpation Pt. Presents with increased tenderness to light palpation of the lateral pectoralis major, suboccipital muscles, proximal attachment of the SCM, and R rhomboid   Strength  Shoulder flexion L 4-/5 R 4-/5 *limited due to increased pain Shoulder abduction (deltoid/supraspinatus, axillary/suprascapular n, C5) L 4-/5 R 4-/5* limited due to increased pain in neck Elbow flexion (biceps brachii, brachialis, brachioradialis, musculoskeletal n, C5/6) R 4+/5 L 4+/5 *limited due to increased pain  Elbow extension (triceps, radial n, C7) R 4/5 L 4/5 *limited due to increased pain Finger adduction (interossei, ulnar n, T1) R 4/5 L 4+/5  Cervical isometrics deferred   Grip Strength: R 39#  L 82#  AROM Cervical Flexion 35 deg Cervical Extension 44 deg* Cervical Lateral Flexion R 18 deg* L 25 deg Cervical Rotation R 55 deg* L 70 deg  Shoulder Flexion WNL bilaterally Shoulder Abduction WNL bilaterally Shoulder External Rotation WNL bilaterally Shoulder Internal Rotation WNL bilaterally  Elbow Extension WNL bilaterally Elbow Flexion WNL bilaterally *Indicates pain   SPECIAL TESTS ULTT1 (Median bias): R Positive with 35 deg of shoulder abduction and 35 deg of elbow extension; L Negative ULTT3 (Ulnar bias): R Positive with 20 degs of shoulder abduction; L Negative ULTT2b (Radial bias): R Positive with 20 degs of shoulder abduction; L positive with 60 degs of shoulder abduction     PT Education - 03/18/19 1541    Education Details  Pt. recieved education on sitting posture and HEP to begin gentle cervical ROM    Person(s) Educated  Patient    Methods  Explanation;Demonstration;Handout    Comprehension  Verbalized understanding;Returned demonstration        PT Long Term Goals - 03/18/19 1308      PT LONG TERM GOAL #1   Title  Pt. will improve FOTO score to  >55 to improve independence with ADLs    Baseline  Score 34/Goal 55    Time  4    Period  Weeks    Status  New    Target Date  04/15/19      PT LONG TERM GOAL #2   Title  Pt. will be able to improve R cervical rotation to >70 degs to be able to be able to improve ROM for driving    Baseline  R 55 deg. L 70 deg.    Time  4  Period  Weeks    Status  New    Target Date  04/15/19      PT LONG TERM GOAL #3   Title  Pt. will be able to maintain upright posture with scapular retraction for >20 minutes to improve seated posture for work.    Baseline  Pt. presents with forward head and rounded shoulders and increased thoracic kyphosis.    Time  4    Period  Weeks    Status  New    Target Date  04/15/19      PT LONG TERM GOAL #4   Title  Pt. will improve R grip strength by 30% to improve ability to grasp coffee mug    Baseline  R 39#  L 82#    Time  4    Period  Weeks    Status  New    Target Date  04/15/19       Patient will benefit from skilled therapeutic intervention in order to improve the following deficits and impairments:  Decreased mobility, Decreased endurance, Hypomobility, Increased muscle spasms, Impaired sensation, Decreased range of motion, Decreased scar mobility, Impaired tone, Improper body mechanics, Postural dysfunction, Impaired flexibility, Decreased strength, Decreased activity tolerance  Visit Diagnosis: Muscle weakness (generalized)  Joint stiffness of spine  Pain in right arm   Problem List Patient Active Problem List   Diagnosis Date Noted  . H/O of hemilaminectomy 02/15/2019  . 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[place]) 11/17/2018  . Fatigue 09/23/2017  . History of pneumonia 04/30/2016  . Grief reaction 04/30/2016  . Vitamin D deficiency 02/22/2015  . Hyperlipidemia 01/01/2015  . Visit for preventive health examination 12/04/2013  . Lower extremity edema 09/02/2013  . Essential hypertension 09/02/2013  . History of cardiomyopathy  09/02/2013  . SVT (supraventricular tachycardia) ([place]) 09/02/2013  . Palpitations 09/02/2013  . Contrast media allergy 03/10/2013  . Fibrocystic breast disease   . Obesity (BMI 30-39.9) 02/05/2012  . Meniere disease     Pura Spice, PT, DPT # [id number] Andrey Campanile, SPT 03/19/2019, 5:07 PM  [facility] [address] [place], [place], [address] Phone: [phone]   Fax:  [phone]  Name: MAELY CLEMENTS MRN: [id number] Date of Birth: November 25, 1965

## 2019-03-18 NOTE — Patient Instructions (Addendum)
Access Code: [email or web address]  URL: [email or web address]  Date: 03/18/2019  Prepared by: Dorene Grebe   Exercises  Seated Scapular Retraction - 10 reps - 2 sets - 3 seconds hold - 1x daily - 7x weekly  Seated Shoulder Flexion - 10 reps - 2 sets - 1x daily - 7x weekly  Seated Cervical Flexion AROM - 10 reps - 2 sets - 3-5 seconds hold - 1x daily - 7x weekly  Seated Cervical Sidebending AROM - 10 reps - 2 sets - 3-5 seconds hold - 1x daily - 7x weekly  Seated Cervical Rotation AROM - 10 reps - 2 sets - 3-5 seconds hold - 1x daily - 7x weekly  Supine Chin Tuck - 10 reps - 2 sets - 3- 5 seconds hold - 1x daily - 7x weekly  Putty Squeezes - 3-5 minutes 1x daily - 7x weekly

## 2019-03-19 ENCOUNTER — Encounter: Payer: Self-pay | Admitting: Physical Therapy

## 2019-03-22 ENCOUNTER — Ambulatory Visit: Payer: BC Managed Care – PPO | Admitting: Physical Therapy

## 2019-03-22 ENCOUNTER — Other Ambulatory Visit: Payer: Self-pay

## 2019-03-22 ENCOUNTER — Encounter: Payer: Self-pay | Admitting: Physical Therapy

## 2019-03-22 DIAGNOSIS — M6281 Muscle weakness (generalized): Secondary | ICD-10-CM | POA: Diagnosis not present

## 2019-03-22 DIAGNOSIS — M2560 Stiffness of unspecified joint, not elsewhere classified: Secondary | ICD-10-CM

## 2019-03-22 DIAGNOSIS — M79601 Pain in right arm: Secondary | ICD-10-CM

## 2019-03-22 NOTE — Therapy (Signed)
[place] [facility] [address] [place], [place], [address] Phone: [phone]   Fax:  [phone]  Physical Therapy Treatment  Patient Details  Name: Amanda Humphrey MRN: [id number] Date of Birth: July 28, 1965 Referring Provider (PT): Remo Lipps, NP   Encounter Date: 03/22/2019  PT End of Session - 03/22/19 1100    Visit Number  2    Number of Visits  9    Date for PT Re-Evaluation  04/15/19    Authorization - Visit Number  2    Authorization - Number of Visits  10    PT Start Time  0731    PT Stop Time  0826    PT Time Calculation (min)  55 min    Activity Tolerance  Patient tolerated treatment well;Patient limited by pain    Behavior During Therapy  [facility] for tasks assessed/performed       Past Medical History:  Diagnosis Date  . Allergy   . Chronic kidney disease    stones  . Fibrocystic breast disease 2013  . Meniere disease   . Migraines     Past Surgical History:  Procedure Laterality Date  . ABDOMINAL HYSTERECTOMY  2011   laprascopic supracervical, [place]co  . APPENDECTOMY  1999  . BREAST BIOPSY Right Nov 2012    fibrocystic disease, [place]  . CESAREAN SECTION     4 total  . CHOLECYSTECTOMY  2011  . TONSILLECTOMY AND ADENOIDECTOMY  1975    There were no vitals filed for this visit.  Subjective Assessment - 03/22/19 1055    Subjective  Pt. reports that she was in a lot of pain after her initial evaluation and experienced a lot of muscle spasms in her neck. Pt. continues to treat muscle spasms with lots of heat. Pt. reports that she is sleeping well in her sleep number bed.    Limitations  Lifting;House hold activities    Patient Stated Goals  Decrease muscle spasms, return to work    Currently in Pain?  Yes    Pain Score  5     Pain Location  Neck    Pain Orientation  Right;Left    Pain Descriptors / Indicators  Spasm;Sharp;Aching    Pain Type  Surgical pain    Pain Onset  More than a month ago    Pain  Frequency  Constant    Pain Location  Hand    Pain Orientation  Right    Pain Descriptors / Indicators  Pins and needles    Pain Type  Surgical pain    Pain Onset  More than a month ago       TheraEx Arm Bike 8'  Standing scapular retractions 2 x 5 Standing wall walks 5 second holds x 5 bilaterally Supine AAROM bilateral shoulder flexion with dowel 2 x 10  Supine AAROM horizontal abduction x 10 alternating   Manual Therapy Supine Cervical ROM (lateral flexion, rotation, flexion, extension) 2 x 10 second stretch each  Supine STM to bilateral sternocleidomastoid proximal attachment (R> L) Prone STM to R rhomboids, paraspinals, and UT  Prone assessment of thoracic spinal mobility (CPAs) grades I and II mobilizations     PT Long Term Goals - 03/18/19 1308      PT LONG TERM GOAL #1   Title  Pt. will improve FOTO score to >55 to improve independence with ADLs    Baseline  Score 34/Goal 55    Time  4  Period  Weeks    Status  New    Target Date  04/15/19      PT LONG TERM GOAL #2   Title  Pt. will be able to improve R cervical rotation to >70 degs to be able to be able to improve ROM for driving    Baseline  R 55 deg. L 70 deg.    Time  4    Period  Weeks    Status  New    Target Date  04/15/19      PT LONG TERM GOAL #3   Title  Pt. will be able to maintain upright posture with scapular retraction for >20 minutes to improve seated posture for work.    Baseline  Pt. presents with forward head and rounded shoulders and increased thoracic kyphosis.    Time  4    Period  Weeks    Status  New    Target Date  04/15/19      PT LONG TERM GOAL #4   Title  Pt. will improve R grip strength by 30% to improve ability to grasp coffee mug    Baseline  R 39#  L 82#    Time  4    Period  Weeks    Status  New    Target Date  04/15/19            Plan - 03/22/19 1102    Clinical Impression Statement  Pt. presented to the clinic today with increased R>L sided neck pain and  muscle spasms. Pt. tolerated active shoulder range of motion exericses with slight increased tightness in the neck and increased pins and needles down the R arm. Pt. was able to achieve approximately 120 of shoulder flexion with dowel in supine without increased pain. Pt. had increased pain and a catching sensation in R scapula with standing scapular retractions with YTB. Pt. did not have bilateral UT activation with scapular retractions but reported increased discomfort in the neck and pins/needles down the arm. Pt. tolerated prone STM to the rhomboids and gentle petrissage to the R UT. Pt. had decreased mid-lower thoracic spine mobility with CPAs grade I/II mobilizations. Pt. reports compliance with HEP and demonstrated understanding of all exercises.    Personal Factors and Comorbidities  Comorbidity 2;Profession;Past/Current Experience    Comorbidities  Hypertension, Obesity    Examination-Activity Limitations  Sleep;Reach Overhead;Lift;Bed Mobility    Stability/Clinical Decision Making  Evolving/Moderate complexity    Clinical Decision Making  Moderate    Rehab Potential  Good    PT Frequency  2x / week    PT Duration  4 weeks    PT Treatment/Interventions  ADLs/Self Care Home Management;Aquatic Therapy;Biofeedback;Cryotherapy;Electrical Stimulation;Moist Heat;Ultrasound;Functional mobility training;Therapeutic activities;Therapeutic exercise;Balance training;Neuromuscular re-education;Patient/family education;Manual techniques;Scar mobilization;Passive range of motion;Dry needling;Energy conservation;Taping    PT Next Visit Plan  Scapular Retractions, STM, isometrics    PT Home Exercise Plan  [email or web address]       Patient will benefit from skilled therapeutic intervention in order to improve the following deficits and impairments:  Decreased mobility, Decreased endurance, Hypomobility, Increased muscle spasms, Impaired sensation, Decreased range of motion, Decreased scar mobility, Impaired tone,  Improper body mechanics, Postural dysfunction, Impaired flexibility, Decreased strength, Decreased activity tolerance  Visit Diagnosis: Muscle weakness (generalized)  Joint stiffness of spine  Pain in right arm     Problem List Patient Active Problem List   Diagnosis Date Noted  . H/O of hemilaminectomy 02/15/2019  . 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  Fatigue 09/23/2017  . History of pneumonia 04/30/2016  . Grief reaction 04/30/2016  . Vitamin D deficiency 02/22/2015  . Hyperlipidemia 01/01/2015  . Visit for preventive health examination 12/04/2013  . Lower extremity edema 09/02/2013  . Essential hypertension 09/02/2013  . History of cardiomyopathy 09/02/2013  . SVT (supraventricular tachycardia) ([facility]) 09/02/2013  . Palpitations 09/02/2013  . Contrast media allergy 03/10/2013  . Fibrocystic breast disease   . Obesity (BMI 30-39.9) 02/05/2012  . Meniere disease    Cammie Mcgee, PT, DPT # [id number] Verl Blalock, SPT 03/23/2019, 12:11 PM  [place] [address] [place], [place], [address] Phone: [phone]   Fax:  [phone]  Name: Amanda Humphrey MRN: [id number] Date of Birth: 01-06-1966

## 2019-03-24 ENCOUNTER — Other Ambulatory Visit: Payer: Self-pay

## 2019-03-24 ENCOUNTER — Encounter: Payer: Self-pay | Admitting: Physical Therapy

## 2019-03-24 ENCOUNTER — Ambulatory Visit: Payer: BC Managed Care – PPO | Admitting: Physical Therapy

## 2019-03-24 DIAGNOSIS — M6281 Muscle weakness (generalized): Secondary | ICD-10-CM

## 2019-03-24 DIAGNOSIS — M79601 Pain in right arm: Secondary | ICD-10-CM

## 2019-03-24 DIAGNOSIS — M2560 Stiffness of unspecified joint, not elsewhere classified: Secondary | ICD-10-CM

## 2019-03-24 NOTE — Therapy (Signed)
[facility] [address] [place], [place], [address] Phone: [phone]   Fax:  [phone]  Physical Therapy Treatment  Patient Details  Name: Amanda Humphrey MRN: [id number] Date of Birth: 03-29-1965 Referring Provider (PT): Maryland Pink, NP   Encounter Date: 03/24/2019  PT End of Session - 03/24/19 0839    Visit Number  3    Number of Visits  9    Date for PT Re-Evaluation  04/15/19    Authorization - Visit Number  3    Authorization - Number of Visits  10    PT Start Time  0731    PT Stop Time  0834    PT Time Calculation (min)  63 min    Activity Tolerance  Patient tolerated treatment well;Patient limited by pain    Behavior During Therapy  [facility] for tasks assessed/performed       Past Medical History:  Diagnosis Date  . Allergy   . Chronic kidney disease    stones  . Fibrocystic breast disease 2013  . Meniere disease   . Migraines     Past Surgical History:  Procedure Laterality Date  . ABDOMINAL HYSTERECTOMY  2011   laprascopic supracervical, DeFrancesco  . APPENDECTOMY  1999  . BREAST BIOPSY Right Nov 2012    fibrocystic disease, Michela Pitcher  . CESAREAN SECTION     4 total  . CHOLECYSTECTOMY  2011  . TONSILLECTOMY AND ADENOIDECTOMY  1975    There were no vitals filed for this visit.  Subjective Assessment - 03/24/19 0837    Subjective  Pt. reports that she was "flared up" for the rest of the day after last treatment. Pt. reports that she did not sleep with the cervical collar on last night and she was able to sleep okay. Pt. reports she had a 1 hour conference call and she wore the soft cervical collar to help take tension off her neck.    Limitations  Lifting;House hold activities    Patient Stated Goals  Decrease muscle spasms, return to work    Currently in Pain?  Yes    Pain Score  --   No objective number given   Pain Location  Neck    Pain Orientation  Right;Left    Pain Descriptors / Indicators   Aching;Sore;Spasm    Pain Onset  More than a month ago    Multiple Pain Sites  No    Pain Onset  More than a month ago       TheraEx Arm bike 3 min forward/ 3 min backward Standing wall walks in flexion with 5 second hold x 12 each arm Seated pulleys shoulder flexion and abduction 5 sec holds x 10 each bilaterally  Manual Therapy Supine STM to bilateral SCM/suboccipital release x 5 minutes Supine PROM of cervical spine (flexion, lateral flexion, rotation, extension) 10 second holds x 3 each direction Prone STM to R rhomboids, medial border of scapula, UT x 12 minutes    PT Long Term Goals - 03/18/19 1308      PT LONG TERM GOAL #1   Title  Pt. will improve FOTO score to >55 to improve independence with ADLs    Baseline  Score 34/Goal 55    Time  4    Period  Weeks    Status  New    Target Date  04/15/19      PT LONG TERM GOAL #2   Title  Pt. will be  able to improve R cervical rotation to >70 degs to be able to be able to improve ROM for driving    Baseline  R 55 deg. L 70 deg.    Time  4    Period  Weeks    Status  New    Target Date  04/15/19      PT LONG TERM GOAL #3   Title  Pt. will be able to maintain upright posture with scapular retraction for >20 minutes to improve seated posture for work.    Baseline  Pt. presents with forward head and rounded shoulders and increased thoracic kyphosis.    Time  4    Period  Weeks    Status  New    Target Date  04/15/19      PT LONG TERM GOAL #4   Title  Pt. will improve R grip strength by 30% to improve ability to grasp coffee mug    Baseline  R 39#  L 82#    Time  4    Period  Weeks    Status  New    Target Date  04/15/19            Plan - 03/24/19 0839    Clinical Impression Statement  R>L sided neck and scapular pain with muscle spasms prior to tx. session.  Pt. demonstrated full active bilateral shoulder AAROM flexion with seated pulley. Pt. had decreased R shoulder abduction with seated pulleys and full L  shoulder abduction. Pt. was able to maintain slight chin tuck and cervical retraction with seated pully. Pt. still has increased rounded shoulders and thoracic kyphosis with standing exercises.  Pt. showed improvements in supine PROM of cervical spine with noted limitations in extension. Pt. tolerated increased pressure with petrissage to the UT and had increased trigger points in the the rhomboids/middle trap/upper trap/medial border of the scapula. Pt. was educated on decreasing usage of the cervical collar during long work related activities.    Personal Factors and Comorbidities  Comorbidity 2;Profession;Past/Current Experience    Comorbidities  Hypertension, Obesity    Examination-Activity Limitations  Sleep;Reach Overhead;Lift;Bed Mobility    Stability/Clinical Decision Making  Evolving/Moderate complexity    Rehab Potential  Good    PT Frequency  2x / week    PT Duration  4 weeks    PT Treatment/Interventions  ADLs/Self Care Home Management;Aquatic Therapy;Biofeedback;Cryotherapy;Electrical Stimulation;Moist Heat;Ultrasound;Functional mobility training;Therapeutic activities;Therapeutic exercise;Balance training;Neuromuscular re-education;Patient/family education;Manual techniques;Scar mobilization;Passive range of motion;Dry needling;Energy conservation;Taping    PT Next Visit Plan  Scapular Retractions, Pulley, STM R>L    PT Home Exercise Plan  [email or web address]       Patient will benefit from skilled therapeutic intervention in order to improve the following deficits and impairments:  Decreased mobility, Decreased endurance, Hypomobility, Increased muscle spasms, Impaired sensation, Decreased range of motion, Decreased scar mobility, Impaired tone, Improper body mechanics, Postural dysfunction, Impaired flexibility, Decreased strength, Decreased activity tolerance  Visit Diagnosis: Muscle weakness (generalized)  Joint stiffness of spine  Pain in right arm     Problem List Patient Active  Problem List   Diagnosis Date Noted  . H/O of hemilaminectomy 02/15/2019  . 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 Fatigue 09/23/2017  . History of pneumonia 04/30/2016  . Grief reaction 04/30/2016  . Vitamin D deficiency 02/22/2015  . Hyperlipidemia 01/01/2015  . Visit for preventive health examination 12/04/2013  . Lower extremity edema 09/02/2013  . Essential hypertension 09/02/2013  . History of cardiomyopathy 09/02/2013  . SVT (  supraventricular tachycardia) ([place]) 09/02/2013  . Palpitations 09/02/2013  . Contrast media allergy 03/10/2013  . Fibrocystic breast disease   . Obesity (BMI 30-39.9) 02/05/2012  . Meniere disease    Pura Spice, PT, DPT # [id number] Andrey Campanile, SPT 03/24/2019, 2:40 PM  [facility] [address] [place], [place], [address] Phone: [phone]   Fax:  [phone]  Name: STEFFANIE MINGLE MRN: [id number] Date of Birth: 1965/09/17

## 2019-03-29 ENCOUNTER — Encounter: Payer: Self-pay | Admitting: Physical Therapy

## 2019-03-29 ENCOUNTER — Other Ambulatory Visit: Payer: Self-pay

## 2019-03-29 ENCOUNTER — Ambulatory Visit: Payer: BC Managed Care – PPO | Attending: Acute Care | Admitting: Physical Therapy

## 2019-03-29 DIAGNOSIS — M6281 Muscle weakness (generalized): Secondary | ICD-10-CM | POA: Diagnosis not present

## 2019-03-29 DIAGNOSIS — M79601 Pain in right arm: Secondary | ICD-10-CM

## 2019-03-29 DIAGNOSIS — M2560 Stiffness of unspecified joint, not elsewhere classified: Secondary | ICD-10-CM | POA: Diagnosis not present

## 2019-03-29 NOTE — Therapy (Signed)
[place] [address]. [place], [place], [address] Phone: [phone]   Fax:  [phone]  Physical Therapy Treatment  Patient Details  Name: Amanda Humphrey MRN: [id number] Date of Birth: 08-18-1965 Referring Provider (PT): Maryland Pink, NP   Encounter Date: 03/29/2019   Treatment: 4 of 9.  Recert date: 04/15/2019 0731 to 0818   Past Medical History:  Diagnosis Date  . Allergy   . Chronic kidney disease    stones  . Fibrocystic breast disease 2013  . Meniere disease   . Migraines     Past Surgical History:  Procedure Laterality Date  . ABDOMINAL HYSTERECTOMY  2011   laprascopic supracervical, DeFrancesco  . APPENDECTOMY  1999  . BREAST BIOPSY Right Nov 2012    fibrocystic disease, Michela Pitcher  . CESAREAN SECTION     4 total  . CHOLECYSTECTOMY  2011  . TONSILLECTOMY AND ADENOIDECTOMY  1975    There were no vitals filed for this visit.    Pt. states she had a 4 hour Webex call yesterday, which was difficult. Pt. had to take several movement breaks during the conference and incorporate MH. Pt. is planning on returning with 50% from home/ 50% at the office. Pt. returns to MD next Thursday 3/11 to discuss POC. Pt. reports 6/10 in R UT prior to tx (no Gapapentin this morning).       TheraEx:  Arm bike 3 min forward/ 3 min backward (warm-up) Seated B shoulder AROM (all planes) 2# seated chest press/ punches/ biceps 20x each. YTB triceps/ scap. Retraction 20x (mirror feedback)- 30x each  Supine cervical manual isometrics (light)- rotn./ lateral flexion 4x each with short holds.    Manual Therapy Supine STM to bilateral SCM/suboccipital release x 5 minutes Supine PROM of cervical spine (flexion, lateral flexion, rotation, extension) 10 second holds x 3 each direction Seated/supine STM to B UT/ scap. Region/ posterior deltoid.      PT Long Term Goals - 03/18/19 1308      PT LONG TERM GOAL #1   Title  Pt. will  improve FOTO score to >55 to improve independence with ADLs    Baseline  Score 34/Goal 55    Time  4    Period  Weeks    Status  New    Target Date  04/15/19      PT LONG TERM GOAL #2   Title  Pt. will be able to improve R cervical rotation to >70 degs to be able to be able to improve ROM for driving    Baseline  R 55 deg. L 70 deg.    Time  4    Period  Weeks    Status  New    Target Date  04/15/19      PT LONG TERM GOAL #3   Title  Pt. will be able to maintain upright posture with scapular retraction for >20 minutes to improve seated posture for work.    Baseline  Pt. presents with forward head and rounded shoulders and increased thoracic kyphosis.    Time  4    Period  Weeks    Status  New    Target Date  04/15/19      PT LONG TERM GOAL #4   Title  Pt. will improve R grip strength by 30% to improve ability to grasp coffee mug    Baseline  R 39#  L 82#    Time  4  Period  Weeks    Status  New    Target Date  04/15/19         Pt. remains pain limited in neck/ R UE with light resisted/ repetitive UE ex. PT introducing more light resisted ther.ex. with proper technique/ cuing for posture. Pt. benefit from mirror feedback to prevent UT compensation and promote improve head position. R cervical/ UT discomfort with L UT/ levator stretches due to closing down R UT/cervical positioning. PT reenforced importance of HEP.      Patient will benefit from skilled therapeutic intervention in order to improve the following deficits and impairments:  Decreased mobility, Decreased endurance, Hypomobility, Increased muscle spasms, Impaired sensation, Decreased range of motion, Decreased scar mobility, Impaired tone, Improper body mechanics, Postural dysfunction, Impaired flexibility, Decreased strength, Decreased activity tolerance  Visit Diagnosis: Muscle weakness (generalized)  Joint stiffness of spine  Pain in right arm     Problem List Patient Active Problem List    Diagnosis Date Noted  . H/O of hemilaminectomy 02/15/2019  . 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[place]) 11/17/2018  . Fatigue 09/23/2017  . History of pneumonia 04/30/2016  . Grief reaction 04/30/2016  . Vitamin D deficiency 02/22/2015  . Hyperlipidemia 01/01/2015  . Visit for preventive health examination 12/04/2013  . Lower extremity edema 09/02/2013  . Essential hypertension 09/02/2013  . History of cardiomyopathy 09/02/2013  . SVT (supraventricular tachycardia) ([place]) 09/02/2013  . Palpitations 09/02/2013  . Contrast media allergy 03/10/2013  . Fibrocystic breast disease   . Obesity (BMI 30-39.9) 02/05/2012  . Meniere disease    Pura Spice, PT, DPT # [phone] 03/30/2019, 7:18 PM  [facility] [address] [place], [place], [address] Phone: [phone]   Fax:  [phone]  Name: GUIDA ASMAN MRN: [id number] Date of Birth: [birth year]

## 2019-03-31 ENCOUNTER — Other Ambulatory Visit: Payer: Self-pay

## 2019-03-31 ENCOUNTER — Encounter: Payer: Self-pay | Admitting: Physical Therapy

## 2019-03-31 ENCOUNTER — Ambulatory Visit: Payer: BC Managed Care – PPO | Admitting: Physical Therapy

## 2019-03-31 DIAGNOSIS — M2560 Stiffness of unspecified joint, not elsewhere classified: Secondary | ICD-10-CM | POA: Diagnosis not present

## 2019-03-31 DIAGNOSIS — M79601 Pain in right arm: Secondary | ICD-10-CM

## 2019-03-31 DIAGNOSIS — M6281 Muscle weakness (generalized): Secondary | ICD-10-CM

## 2019-03-31 NOTE — Therapy (Addendum)
[facility] [address] [place], [place], [address] Phone: [phone]   Fax:  [phone]  Physical Therapy Treatment  Patient Details  Name: Amanda Humphrey MRN: [id number] Date of Birth: 04-07-1965 Referring Provider (PT): Maryland Pink, NP   Encounter Date: 03/31/2019    Treatment: 5 of 9.  Recert date: 04/15/2019 0729 to 3536    Past Medical History:  Diagnosis Date  . Allergy   . Chronic kidney disease    stones  . Fibrocystic breast disease 2013  . Meniere disease   . Migraines     Past Surgical History:  Procedure Laterality Date  . ABDOMINAL HYSTERECTOMY  2011   laprascopic supracervical, DeFrancesco  . APPENDECTOMY  1999  . BREAST BIOPSY Right Nov 2012    fibrocystic disease, Michela Pitcher  . CESAREAN SECTION     4 total  . CHOLECYSTECTOMY  2011  . TONSILLECTOMY AND ADENOIDECTOMY  1975    There were no vitals filed for this visit.   Pt. reports 7/10 neck/ B shoulder pain. Pt. took Valium last night. Increase neck/ B shoulder pain after last tx. with light resisted tasks. Pt. scheduled to return to work at half time next week and f/u with MD.      Philip Aspen:  Arm bike 3 min forward/ 3 min backward (warm-up) Seated B shoulder AROM (all planes) Supine wt. Wand: sh. Flexion/ abduction/ press-ups/ serratus punches 10x2 each. Supine 2# dumbbell: shoulder flexion (alternating)/ chest press/ bicep curls/ horizontal abduction/ adduction 20x.   Supine cervical manual isometrics (light)- rotn./ lateral flexion 4x each with short holds.   Standing pec stretches at doorway (added to HEP).   Manual Therapy  Supine STM to bilateral SCM/suboccipital release x 5 minutes Supine PROM of cervical spine (flexion, lateral flexion, rotation, extension) 10 second holds x 3 each direction  Discussed computer posture/ seated posture.       PT Long Term Goals - 03/18/19 1308      PT LONG TERM GOAL #1   Title  Pt. will  improve FOTO score to >55 to improve independence with ADLs    Baseline  Score 34/Goal 55    Time  4    Period  Weeks    Status  New    Target Date  04/15/19      PT LONG TERM GOAL #2   Title  Pt. will be able to improve R cervical rotation to >70 degs to be able to be able to improve ROM for driving    Baseline  R 55 deg. L 70 deg.    Time  4    Period  Weeks    Status  New    Target Date  04/15/19      PT LONG TERM GOAL #3   Title  Pt. will be able to maintain upright posture with scapular retraction for >20 minutes to improve seated posture for work.    Baseline  Pt. presents with forward head and rounded shoulders and increased thoracic kyphosis.    Time  4    Period  Weeks    Status  New    Target Date  04/15/19      PT LONG TERM GOAL #4   Title  Pt. will improve R grip strength by 30% to improve ability to grasp coffee mug    Baseline  R 39#  L 82#    Time  4    Period  Weeks  Status  New    Target Date  04/15/19         Pt. reports marked increase in neck/ B shoulder pain since last PT visit. Pt. states she had to take Valium last night. Pt. states R side of neck is always worse than L side. Pt. c/o L upper cervical "sharp" pain and able to reproduce pain with light palpation. PT focused on supine based UE strengthening ex. with head/neck supported to manage pts. pain. Pt. pain focused t/o tx. session, esp. with repetitive tasks.      Patient will benefit from skilled therapeutic intervention in order to improve the following deficits and impairments:  Decreased mobility, Decreased endurance, Hypomobility, Increased muscle spasms, Impaired sensation, Decreased range of motion, Decreased scar mobility, Impaired tone, Improper body mechanics, Postural dysfunction, Impaired flexibility, Decreased strength, Decreased activity tolerance  Visit Diagnosis: Muscle weakness (generalized)  Joint stiffness of spine  Pain in right arm     Problem List Patient Active  Problem List   Diagnosis Date Noted  . H/O of hemilaminectomy 02/15/2019  . 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[place]) 11/17/2018  . Fatigue 09/23/2017  . History of pneumonia 04/30/2016  . Grief reaction 04/30/2016  . Vitamin D deficiency 02/22/2015  . Hyperlipidemia 01/01/2015  . Visit for preventive health examination 12/04/2013  . Lower extremity edema 09/02/2013  . Essential hypertension 09/02/2013  . History of cardiomyopathy 09/02/2013  . SVT (supraventricular tachycardia) ([place]) 09/02/2013  . Palpitations 09/02/2013  . Contrast media allergy 03/10/2013  . Fibrocystic breast disease   . Obesity (BMI 30-39.9) 02/05/2012  . Meniere disease    Pura Spice, PT, DPT # [phone] 04/02/2019, 6:25 PM  [facility] [address] [place], [place], [address] Phone: [phone]   Fax:  [phone]  Name: JOVANA REMBOLD MRN: [id number] Date of Birth: 1965/10/03

## 2019-04-05 ENCOUNTER — Ambulatory Visit: Payer: BC Managed Care – PPO | Admitting: Physical Therapy

## 2019-04-05 ENCOUNTER — Other Ambulatory Visit: Payer: Self-pay

## 2019-04-05 ENCOUNTER — Encounter: Payer: Self-pay | Admitting: Physical Therapy

## 2019-04-05 DIAGNOSIS — M6281 Muscle weakness (generalized): Secondary | ICD-10-CM

## 2019-04-05 DIAGNOSIS — M2560 Stiffness of unspecified joint, not elsewhere classified: Secondary | ICD-10-CM | POA: Diagnosis not present

## 2019-04-05 DIAGNOSIS — M79601 Pain in right arm: Secondary | ICD-10-CM

## 2019-04-05 NOTE — Therapy (Signed)
[facility] [address]. [place], [place], [address] Phone: [phone]   Fax:  [phone]  Physical Therapy Treatment  Patient Details  Name: Amanda Humphrey MRN: [id number] Date of Birth: [birth year] Referring Provider (PT): Remo Lipps, NP   Encounter Date: 04/05/2019  PT End of Session - 04/05/19 0720    Visit Number  6    Number of Visits  9    Date for PT Re-Evaluation  04/15/19    Authorization - Visit Number  6    Authorization - Number of Visits  10    PT Start Time  0730    PT Stop Time  0821    PT Time Calculation (min)  51 min    Activity Tolerance  Patient tolerated treatment well;Patient limited by pain    Behavior During Therapy  [facility] for tasks assessed/performed       Past Medical History:  Diagnosis Date  . Allergy   . Chronic kidney disease    stones  . Fibrocystic breast disease 2013  . Meniere disease   . Migraines     Past Surgical History:  Procedure Laterality Date  . ABDOMINAL HYSTERECTOMY  2011   laprascopic supracervical, DeFrancesco  . APPENDECTOMY  1999  . BREAST BIOPSY Right Nov 2012    fibrocystic disease, Pat Patrick  . CESAREAN SECTION     4 total  . CHOLECYSTECTOMY  2011  . TONSILLECTOMY AND ADENOIDECTOMY  1975    There were no vitals filed for this visit.  Subjective Assessment - 04/05/19 0719    Subjective  Pt. returned to work yesterday for 4 hours.  Pt. states she had increase in neck pain/symptoms yesterday as the day progressed.  Pt. is planning on updating desk ergonomics at work.    Limitations  Lifting;House hold activities    Patient Stated Goals  Decrease muscle spasms, return to work    Currently in Pain?  Yes    Pain Score  7     Pain Location  Neck    Pain Orientation  Right;Left    Pain Descriptors / Indicators  Aching    Pain Onset  More than a month ago    Pain Onset  More than a month ago        TheraEx:  Arm bike 3 min forward/ 3 min  backward(warm-up) Standing at wall: posture correction with shoulder flexion/ abduction (no resistance). Standing L/R shoulder D1, D2 10x each.  Supine 2# dumbbell: shoulder flexion (alternating)/ chest press/ bicep curls/ horizontal abduction/ adduction 20x.   Supine cervical manual isometrics (light)- rotn./ lateral flexion 4x each with short holds. See updated HEP.  Manual Therapy  Supine STM to bilateral SCM/suboccipital release x 8 minutes Supine PROM of cervical spine (flexion, lateral flexion, rotation, extension) 10 second holds x 3 each direction Supine L/R radial and median nerve glides 3x each (as tolerated).        PT Long Term Goals - 03/18/19 1308      PT LONG TERM GOAL #1   Title  Pt. will improve FOTO score to >55 to improve independence with ADLs    Baseline  Score 34/Goal 55    Time  4    Period  Weeks    Status  New    Target Date  04/15/19      PT LONG TERM GOAL #2   Title  Pt. will be able to improve R cervical rotation to >70 degs to  be able to be able to improve ROM for driving    Baseline  R 55 deg. L 70 deg.    Time  4    Period  Weeks    Status  New    Target Date  04/15/19      PT LONG TERM GOAL #3   Title  Pt. will be able to maintain upright posture with scapular retraction for >20 minutes to improve seated posture for work.    Baseline  Pt. presents with forward head and rounded shoulders and increased thoracic kyphosis.    Time  4    Period  Weeks    Status  New    Target Date  04/15/19      PT LONG TERM GOAL #4   Title  Pt. will improve R grip strength by 30% to improve ability to grasp coffee mug    Baseline  R 39#  L 82#    Time  4    Period  Weeks    Status  New    Target Date  04/15/19            Plan - 04/05/19 0720    Clinical Impression Statement  Pt. able to complete proper standing posture ex. at wall/ mirror with added D1/ D2 ex.  PT attempted to add 1# dumbbells to standing therex. but limited by increase  symptoms.  Pt. benefits from resisted therex. in supine position at this time.  Increaes cervical/ thoracic rotn. today in seated posture.  See updated HEP.    Personal Factors and Comorbidities  Comorbidity 2;Profession;Past/Current Experience    Comorbidities  Hypertension, Obesity    Examination-Activity Limitations  Sleep;Reach Overhead;Lift;Bed Mobility    Stability/Clinical Decision Making  Evolving/Moderate complexity    Clinical Decision Making  Moderate    Rehab Potential  Good    PT Frequency  2x / week    PT Duration  4 weeks    PT Treatment/Interventions  ADLs/Self Care Home Management;Aquatic Therapy;Biofeedback;Cryotherapy;Electrical Stimulation;Moist Heat;Ultrasound;Functional mobility training;Therapeutic activities;Therapeutic exercise;Balance training;Neuromuscular re-education;Patient/family education;Manual techniques;Scar mobilization;Passive range of motion;Dry needling;Energy conservation;Taping    PT Next Visit Plan  Scapular Retractions, STM R>L, strengthening progression ex.Diamantina Monks MD progress note.    PT Home Exercise Plan  [email or web address]       Patient will benefit from skilled therapeutic intervention in order to improve the following deficits and impairments:  Decreased mobility, Decreased endurance, Hypomobility, Increased muscle spasms, Impaired sensation, Decreased range of motion, Decreased scar mobility, Impaired tone, Improper body mechanics, Postural dysfunction, Impaired flexibility, Decreased strength, Decreased activity tolerance  Visit Diagnosis: Muscle weakness (generalized)  Joint stiffness of spine  Pain in right arm     Problem List Patient Active Problem List   Diagnosis Date Noted  . H/O of hemilaminectomy 02/15/2019  . 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 Fatigue 09/23/2017  . History of pneumonia 04/30/2016  . Grief reaction 04/30/2016  . Vitamin D deficiency 02/22/2015  . Hyperlipidemia 01/01/2015  . Visit for  preventive health examination 12/04/2013  . Lower extremity edema 09/02/2013  . Essential hypertension 09/02/2013  . History of cardiomyopathy 09/02/2013  . SVT (supraventricular tachycardia) (HCC) 09/02/2013  . Palpitations 09/02/2013  . Contrast media allergy 03/10/2013  . Fibrocystic breast disease   . Obesity (BMI 30-39.9) 02/05/2012  . Meniere disease    Cammie Mcgee, PT, DPT # [phone] 04/05/2019, 8:32 AM  [facility] [address] [place], [place],  [address] Phone: [phone]   Fax:  [phone]  Name: Amanda Humphrey MRN: [id number] Date of Birth: 02-Feb-1965

## 2019-04-05 NOTE — Patient Instructions (Signed)
Access Code: [email or web address]  URL: [email or web address]  Date: 04/05/2019  Prepared by: Dorene Grebe   Exercises  Seated Shoulder Flexion - 10 reps - 2 sets - 1x daily - 7x weekly  Seated Cervical Flexion AROM - 10 reps - 2 sets - 3-5 seconds hold - 1x daily - 7x weekly  Seated Cervical Sidebending AROM - 10 reps - 2 sets - 3-5 seconds hold - 1x daily - 7x weekly  Seated Cervical Rotation AROM - 10 reps - 2 sets - 3-5 seconds hold - 1x daily - 7x weekly  Supine Chin Tuck - 10 reps - 2 sets - 3- 5 seconds hold - 1x daily - 7x weekly  Scapular Retraction with Resistance - 10 reps - 2 sets - 1x daily - 7x weekly  Putty Squeezes - 1 reps - 1 sets - 3 - 5 minutes hold - 1x daily - 7x weekly  Shoulder PNF D2 Extension - 10 reps - 1 sets - 1x daily - 7x weekly  Standing Single Arm Shoulder PNF D1 Flexion - 10 reps - 1 sets - 1x daily - 7x weekly

## 2019-04-07 ENCOUNTER — Other Ambulatory Visit: Payer: Self-pay

## 2019-04-07 ENCOUNTER — Ambulatory Visit: Payer: BC Managed Care – PPO | Admitting: Physical Therapy

## 2019-04-07 ENCOUNTER — Encounter: Payer: Self-pay | Admitting: Physical Therapy

## 2019-04-07 DIAGNOSIS — M6281 Muscle weakness (generalized): Secondary | ICD-10-CM | POA: Diagnosis not present

## 2019-04-07 DIAGNOSIS — M5412 Radiculopathy, cervical region: Secondary | ICD-10-CM | POA: Diagnosis not present

## 2019-04-07 DIAGNOSIS — M47812 Spondylosis without myelopathy or radiculopathy, cervical region: Secondary | ICD-10-CM | POA: Diagnosis not present

## 2019-04-07 DIAGNOSIS — M2560 Stiffness of unspecified joint, not elsewhere classified: Secondary | ICD-10-CM | POA: Diagnosis not present

## 2019-04-07 DIAGNOSIS — Z9889 Other specified postprocedural states: Secondary | ICD-10-CM | POA: Diagnosis not present

## 2019-04-07 DIAGNOSIS — M79601 Pain in right arm: Secondary | ICD-10-CM

## 2019-04-07 NOTE — Therapy (Signed)
[facility] [address]. [place], [place], [address] Phone: [phone]   Fax:  [phone]  Physical Therapy Treatment  Patient Details  Name: Amanda Humphrey MRN: [id number] Date of Birth: [birth year] Referring Provider (PT): [place] Pink, NP   Encounter Date: 04/07/2019  PT End of Session - 04/07/19 0729    Visit Number  7    Number of Visits  9    Date for PT Re-Evaluation  04/15/19    Authorization - Visit Number  7    Authorization - Number of Visits  10    PT Start Time  0729    PT Stop Time  0820    PT Time Calculation (min)  51 min    Activity Tolerance  Patient tolerated treatment well;Patient limited by pain    Behavior During Therapy  [facility] for tasks assessed/performed       Past Medical History:  Diagnosis Date  . Allergy   . Chronic kidney disease    stones  . Fibrocystic breast disease 2013  . Meniere disease   . Migraines     Past Surgical History:  Procedure Laterality Date  . ABDOMINAL HYSTERECTOMY  2011   laprascopic supracervical, DeFrancesco  . APPENDECTOMY  1999  . BREAST BIOPSY Right Nov 2012    fibrocystic disease, Michela Pitcher  . CESAREAN SECTION     4 total  . CHOLECYSTECTOMY  2011  . TONSILLECTOMY AND ADENOIDECTOMY  1975    There were no vitals filed for this visit.  Subjective Assessment - 04/07/19 0729    Subjective  Pt. states she had a long day at work yesterday (9 hours) and had to take Valium last night.  Pt. reports 7/10 neck pain this morning.  Pt. states she has more ROM in neck since starting PT and is planning to purchase 2# dumbbells for HEP.    Limitations  Lifting;House hold activities    Patient Stated Goals  Decrease muscle spasms, return to work    Currently in Pain?  Yes    Pain Score  7     Pain Location  Neck    Pain Orientation  Left;Right    Pain Descriptors / Indicators  Aching    Pain Onset  More than a month ago    Pain Onset  More than a month ago       Grip  Strength: R 64#  L 80#  AROM Cervical Flexion 42 deg Cervical Extension 50 deg Cervical Lateral Flexion R 38 deg., L 34 deg. Cervical Rotation R 55 deg., L 70 deg.  There.Ex:  Supine 2# dumbbell: shoulder flexion (alternating)/ chest press/ bicep curls/ horizontal abduction/ adduction 20x. Supine cervical manual isometrics (light)- rotn./ lateral flexion 4x each with short holds. Reassess cervical ROM/ UE strengthening  Manual Therapy  Supine STM to bilateral SCM/suboccipital release x 11 minutes Supine PROM of cervical spine (flexion, lateral flexion, rotation, extension) 10 second holds x 3 each direction Supine L/R UT and levator stretches 2x each.   MH to R UT/cervical spine in seated posture after tx./ Seated STM to R UT region.      PT Long Term Goals - 03/18/19 1308      PT LONG TERM GOAL #1   Title  Pt. will improve FOTO score to >55 to improve independence with ADLs    Baseline  Score 34/Goal 55    Time  4    Period  Weeks    Status  New    Target Date  04/15/19      PT LONG TERM GOAL #2   Title  Pt. will be able to improve R cervical rotation to >70 degs to be able to be able to improve ROM for driving    Baseline  R 55 deg. L 70 deg.    Time  4    Period  Weeks    Status  New    Target Date  04/15/19      PT LONG TERM GOAL #3   Title  Pt. will be able to maintain upright posture with scapular retraction for >20 minutes to improve seated posture for work.    Baseline  Pt. presents with forward head and rounded shoulders and increased thoracic kyphosis.    Time  4    Period  Weeks    Status  New    Target Date  04/15/19      PT LONG TERM GOAL #4   Title  Pt. will improve R grip strength by 30% to improve ability to grasp coffee mug    Baseline  R 39#  L 82#    Time  4    Period  Weeks    Status  New    Target Date  04/15/19          Plan - 04/07/19 0729    Clinical Impression Statement  Pt. presents with increase cervical AROM/ UE and  grip strengthening since initial evaluation.  Pt. remains pain liimted/ focused, esp. at R mid-cervical/ UT musculature.  Pt. cued on proper mechanics wtih bed mobility/ seated posture and importance of stretch breaks during the day.  Pt. returns to MD today for f/u visit.    Personal Factors and Comorbidities  Comorbidity 2;Profession;Past/Current Experience    Comorbidities  Hypertension, Obesity    Examination-Activity Limitations  Sleep;Reach Overhead;Lift;Bed Mobility    Stability/Clinical Decision Making  Evolving/Moderate complexity    Clinical Decision Making  Moderate    Rehab Potential  Good    PT Frequency  2x / week    PT Duration  4 weeks    PT Treatment/Interventions  ADLs/Self Care Home Management;Aquatic Therapy;Biofeedback;Cryotherapy;Electrical Stimulation;Moist Heat;Ultrasound;Functional mobility training;Therapeutic activities;Therapeutic exercise;Balance training;Neuromuscular re-education;Patient/family education;Manual techniques;Scar mobilization;Passive range of motion;Dry needling;Energy conservation;Taping    PT Next Visit Plan  Posture/ strengthening ex. program.    PT Home Exercise Plan  [email or web address]       Patient will benefit from skilled therapeutic intervention in order to improve the following deficits and impairments:  Decreased mobility, Decreased endurance, Hypomobility, Increased muscle spasms, Impaired sensation, Decreased range of motion, Decreased scar mobility, Impaired tone, Improper body mechanics, Postural dysfunction, Impaired flexibility, Decreased strength, Decreased activity tolerance  Visit Diagnosis: Muscle weakness (generalized)  Joint stiffness of spine  Pain in right arm     Problem List Patient Active Problem List   Diagnosis Date Noted  . H/O of hemilaminectomy 02/15/2019  . 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 Fatigue 09/23/2017  . History of pneumonia 04/30/2016  . Grief reaction 04/30/2016  . Vitamin D  deficiency 02/22/2015  . Hyperlipidemia 01/01/2015  . Visit for preventive health examination 12/04/2013  . Lower extremity edema 09/02/2013  . Essential hypertension 09/02/2013  . History of cardiomyopathy 09/02/2013  . SVT (supraventricular tachycardia) (HCC) 09/02/2013  . Palpitations 09/02/2013  . Contrast media allergy 03/10/2013  . Fibrocystic breast disease   . Obesity (BMI 30-39.9) 02/05/2012  . Meniere disease    Cammie Mcgee,  PT, DPT # [id number] 04/07/2019, 8:33 AM  [facility] [address]. [place], [place], [address] Phone: [phone]   Fax:  [phone]  Name: SHANAYE RIEF MRN: [id number] Date of Birth: 1965-04-16

## 2019-04-12 ENCOUNTER — Other Ambulatory Visit: Payer: Self-pay

## 2019-04-12 ENCOUNTER — Encounter: Payer: Self-pay | Admitting: Physical Therapy

## 2019-04-12 ENCOUNTER — Ambulatory Visit: Payer: BC Managed Care – PPO | Admitting: Physical Therapy

## 2019-04-12 DIAGNOSIS — M79601 Pain in right arm: Secondary | ICD-10-CM

## 2019-04-12 DIAGNOSIS — M6281 Muscle weakness (generalized): Secondary | ICD-10-CM

## 2019-04-12 DIAGNOSIS — M2560 Stiffness of unspecified joint, not elsewhere classified: Secondary | ICD-10-CM | POA: Diagnosis not present

## 2019-04-12 NOTE — Therapy (Signed)
[facility] [address]. [place], [place], [address] Phone: [phone]   Fax:  [phone]  Physical Therapy Treatment  Patient Details  Name: Amanda Humphrey MRN: [id number] Date of Birth: 11-19-1965 Referring Provider (PT): Maryland Pink, NP   Encounter Date: 04/12/2019  PT End of Session - 04/12/19 0728    Visit Number  8    Number of Visits  9    Date for PT Re-Evaluation  04/15/19    Authorization - Visit Number  8    Authorization - Number of Visits  10    PT Start Time  0728    PT Stop Time  0820    PT Time Calculation (min)  52 min    Activity Tolerance  Patient tolerated treatment well;Patient limited by pain    Behavior During Therapy  [facility] for tasks assessed/performed       Past Medical History:  Diagnosis Date  . Allergy   . Chronic kidney disease    stones  . Fibrocystic breast disease 2013  . Meniere disease   . Migraines     Past Surgical History:  Procedure Laterality Date  . ABDOMINAL HYSTERECTOMY  2011   laprascopic supracervical, DeFrancesco  . APPENDECTOMY  1999  . BREAST BIOPSY Right Nov 2012    fibrocystic disease, Michela Pitcher  . CESAREAN SECTION     4 total  . CHOLECYSTECTOMY  2011  . TONSILLECTOMY AND ADENOIDECTOMY  1975    There were no vitals filed for this visit.  Subjective Assessment - 04/12/19 0728    Subjective  Pt. reports MD f/u went well.  Pt. continues to report persistent neck/ shoulder blade pain ("sharp pain spot")- 7-8/10.    Limitations  Lifting;House hold activities    Patient Stated Goals  Decrease muscle spasms, return to work    Pain Score  7     Pain Location  Neck    Pain Orientation  Right;Left    Pain Descriptors / Indicators  Aching    Pain Onset  More than a month ago    Pain Onset  More than a month ago           There.Ex:  B UBE 3 min. F/b.  Warm-up/ consistent cadence (discussed MD appt).   Standing B shoulder flexion/ abduction/ extension 20x  (mirror feedback).   Seated Nautilus: 30# lat. Pull down/ 30# scap. Retraction/ 30# tricep extension (upright posture)- 20x.   Supine 2# dumbbell: shoulder flexion (alternating)/ chest press/ bicep curls/ horizontal abduction/ adduction 20x. Supine cervical manual isometrics (light)- rotn./ lateral flexion 3x each with short holds.  Manual Therapy  Supine STM to bilateral SCM/suboccipital release x40minutes Supine PROM of cervical spine (flexion, lateral flexion, rotation, extension) 10 second holds x 3 each direction R/L median and radial nerve glides 3x (marked increase in R UE) Supine L/R UT stretches 3x each.       PT Long Term Goals - 03/18/19 1308      PT LONG TERM GOAL #1   Title  Pt. will improve FOTO score to >55 to improve independence with ADLs    Baseline  Score 34/Goal 55    Time  4    Period  Weeks    Status  New    Target Date  04/15/19      PT LONG TERM GOAL #2   Title  Pt. will be able to improve R cervical rotation to >70 degs to be able to  be able to improve ROM for driving    Baseline  R 55 deg. L 70 deg.    Time  4    Period  Weeks    Status  New    Target Date  04/15/19      PT LONG TERM GOAL #3   Title  Pt. will be able to maintain upright posture with scapular retraction for >20 minutes to improve seated posture for work.    Baseline  Pt. presents with forward head and rounded shoulders and increased thoracic kyphosis.    Time  4    Period  Weeks    Status  New    Target Date  04/15/19      PT LONG TERM GOAL #4   Title  Pt. will improve R grip strength by 30% to improve ability to grasp coffee mug    Baseline  R 39#  L 82#    Time  4    Period  Weeks    Status  New    Target Date  04/15/19          Plan - 04/12/19 0728    Clinical Impression Statement  Pt. presents with several trigger points (L UT/ R UT/ R mid-scapular rhomboid musculature).  Pt. is not a candidate for dry needling at this time due to surgery.  Pt. able to  complete Nautilus ther.ex. with proper technique and marked increase in R median/radial nerve length during ULTT.  PT will reassess goals/ schedule and issue new HEP next tx. session.    Personal Factors and Comorbidities  Comorbidity 2;Profession;Past/Current Experience    Comorbidities  Hypertension, Obesity    Examination-Activity Limitations  Sleep;Reach Overhead;Lift;Bed Mobility    Stability/Clinical Decision Making  Evolving/Moderate complexity    Clinical Decision Making  Moderate    Rehab Potential  Good    PT Frequency  2x / week    PT Duration  4 weeks    PT Treatment/Interventions  ADLs/Self Care Home Management;Aquatic Therapy;Biofeedback;Cryotherapy;Electrical Stimulation;Moist Heat;Ultrasound;Functional mobility training;Therapeutic activities;Therapeutic exercise;Balance training;Neuromuscular re-education;Patient/family education;Manual techniques;Scar mobilization;Passive range of motion;Dry needling;Energy conservation;Taping    PT Next Visit Plan  Posture/ strengthening ex. program.  Reassess goals/ issue new HEP.    PT Home Exercise Plan  [email or web address]       Patient will benefit from skilled therapeutic intervention in order to improve the following deficits and impairments:  Decreased mobility, Decreased endurance, Hypomobility, Increased muscle spasms, Impaired sensation, Decreased range of motion, Decreased scar mobility, Impaired tone, Improper body mechanics, Postural dysfunction, Impaired flexibility, Decreased strength, Decreased activity tolerance  Visit Diagnosis: Muscle weakness (generalized)  Joint stiffness of spine  Pain in right arm     Problem List Patient Active Problem List   Diagnosis Date Noted  . H/O of hemilaminectomy 02/15/2019  . 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 Fatigue 09/23/2017  . History of pneumonia 04/30/2016  . Grief reaction 04/30/2016  . Vitamin D deficiency 02/22/2015  . Hyperlipidemia 01/01/2015  . Visit  for preventive health examination 12/04/2013  . Lower extremity edema 09/02/2013  . Essential hypertension 09/02/2013  . History of cardiomyopathy 09/02/2013  . SVT (supraventricular tachycardia) (HCC) 09/02/2013  . Palpitations 09/02/2013  . Contrast media allergy 03/10/2013  . Fibrocystic breast disease   . Obesity (BMI 30-39.9) 02/05/2012  . Meniere disease    Cammie Mcgee, PT, DPT # [id number] 04/12/2019, 8:33 AM  [facility] [address] [place], [place], [address]  Phone: [phone]   Fax:  [phone]  Name: Amanda Humphrey MRN: [id number] Date of Birth: [birth year]

## 2019-04-14 ENCOUNTER — Other Ambulatory Visit: Payer: Self-pay

## 2019-04-14 ENCOUNTER — Encounter: Payer: Self-pay | Admitting: Physical Therapy

## 2019-04-14 ENCOUNTER — Ambulatory Visit: Payer: BC Managed Care – PPO | Admitting: Physical Therapy

## 2019-04-14 DIAGNOSIS — M2560 Stiffness of unspecified joint, not elsewhere classified: Secondary | ICD-10-CM

## 2019-04-14 DIAGNOSIS — M79601 Pain in right arm: Secondary | ICD-10-CM

## 2019-04-14 DIAGNOSIS — M6281 Muscle weakness (generalized): Secondary | ICD-10-CM

## 2019-04-14 NOTE — Therapy (Signed)
[facility] [address]. [place], [place], [address] Phone: [phone]   Fax:  [phone]  Physical Therapy Treatment  Patient Details  Name: Amanda Humphrey MRN: [id number] Date of Birth: 12-07-1965 Referring Provider (PT): [place] Pink, NP   Encounter Date: 04/14/2019  PT End of Session - 04/14/19 1323    Visit Number  9    Number of Visits  9    Date for PT Re-Evaluation  04/15/19    Authorization - Visit Number  9    Authorization - Number of Visits  10    PT Start Time  0731    PT Stop Time  0818    PT Time Calculation (min)  47 min    Activity Tolerance  Patient tolerated treatment well;Patient limited by pain    Behavior During Therapy  [facility] for tasks assessed/performed       Past Medical History:  Diagnosis Date  . Allergy   . Chronic kidney disease    stones  . Fibrocystic breast disease 2013  . Meniere disease   . Migraines     Past Surgical History:  Procedure Laterality Date  . ABDOMINAL HYSTERECTOMY  2011   laprascopic supracervical, DeFrancesco  . APPENDECTOMY  1999  . BREAST BIOPSY Right Nov 2012    fibrocystic disease, Michela Pitcher  . CESAREAN SECTION     4 total  . CHOLECYSTECTOMY  2011  . TONSILLECTOMY AND ADENOIDECTOMY  1975    There were no vitals filed for this visit.  Subjective Assessment - 04/14/19 1315    Subjective  Pt. states she has a "kink" in L side of neck when waking up this morning.  Pt. reports continued neck pain of 7/10.  Pt. has not used neck brace for a couple weeks now.  Pt. scheduled for 2nd Covid vaccine shot today.    Limitations  Lifting;House hold activities    Patient Stated Goals  Decrease muscle spasms, return to work    Currently in Pain?  Yes    Pain Score  7     Pain Location  Neck    Pain Orientation  Right;Left    Pain Onset  More than a month ago    Pain Onset  More than a month ago        There.Ex:  B UBE 3 min. F/b.  Warm-up/ consistent cadence.   Standing B shoulder flexion/ abduction/ extension/ D1 and D2 ex. 20x each (mirror feedback).   Supine B shoulder with B shoulder flexion (cuing for proper breathing).   Supine cervical manual isometrics (light)- rotn./ lateral flexion/ chin tucks 5x each with short holds.  Manual Therapy  Supine STM to bilateral UT/ cervical paraspinals (base of occiput)-  Supine AA/PROM of cervical spine (flexion, lateral flexion, rotation, extension) 10 second holds x 3 each direction R/L median and radial nerve glides 3x (R wrist discomfort/ stiffness due to previous surgery) Supine L/RUT stretches 3x each.   Seated STM to cervical/ thoracic musculature at end of tx.     PT Long Term Goals - 03/18/19 1308      PT LONG TERM GOAL #1   Title  Pt. will improve FOTO score to >55 to improve independence with ADLs    Baseline  Score 34/Goal 55    Time  4    Period  Weeks    Status  New    Target Date  04/15/19      PT LONG  TERM GOAL #2   Title  Pt. will be able to improve R cervical rotation to >70 degs to be able to be able to improve ROM for driving    Baseline  R 55 deg. L 70 deg.    Time  4    Period  Weeks    Status  New    Target Date  04/15/19      PT LONG TERM GOAL #3   Title  Pt. will be able to maintain upright posture with scapular retraction for >20 minutes to improve seated posture for work.    Baseline  Pt. presents with forward head and rounded shoulders and increased thoracic kyphosis.    Time  4    Period  Weeks    Status  New    Target Date  04/15/19      PT LONG TERM GOAL #4   Title  Pt. will improve R grip strength by 30% to improve ability to grasp coffee mug    Baseline  R 39#  L 82#    Time  4    Period  Weeks    Status  New    Target Date  04/15/19         Plan - 04/14/19 1324    Clinical Impression Statement  (+) L UT/ subocciput tenderness with palpation/ hand placment during cervical stretches.  Generalized muscle tightness noted in B UT  musculature and trigger points noted.  No resistive ex. today with focus on cervical/ thoracic ROM, upright posture and pain mgmt.  Good R ULTT (median/ radial nerve) with wrist limitations due to previous surgery years ago.  Good technique/ tx. tolerance with gentle cervical isometrics (chin tuck/ rotn.).    Personal Factors and Comorbidities  Comorbidity 2;Profession;Past/Current Experience    Comorbidities  Hypertension, Obesity    Examination-Activity Limitations  Sleep;Reach Overhead;Lift;Bed Mobility    Stability/Clinical Decision Making  Evolving/Moderate complexity    Clinical Decision Making  Moderate    Rehab Potential  Good    PT Frequency  2x / week    PT Duration  4 weeks    PT Treatment/Interventions  ADLs/Self Care Home Management;Aquatic Therapy;Biofeedback;Cryotherapy;Electrical Stimulation;Moist Heat;Ultrasound;Functional mobility training;Therapeutic activities;Therapeutic exercise;Balance training;Neuromuscular re-education;Patient/family education;Manual techniques;Scar mobilization;Passive range of motion;Dry needling;Energy conservation;Taping    PT Next Visit Plan  Posture/ strengthening ex. program.  Reassess goals/ issue new HEP.  RECERT    PT Home Exercise Plan  [email or web address]       Patient will benefit from skilled therapeutic intervention in order to improve the following deficits and impairments:  Decreased mobility, Decreased endurance, Hypomobility, Increased muscle spasms, Impaired sensation, Decreased range of motion, Decreased scar mobility, Impaired tone, Improper body mechanics, Postural dysfunction, Impaired flexibility, Decreased strength, Decreased activity tolerance  Visit Diagnosis: Muscle weakness (generalized)  Joint stiffness of spine  Pain in right arm     Problem List Patient Active Problem List   Diagnosis Date Noted  . H/O of hemilaminectomy 02/15/2019  . 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 Fatigue 09/23/2017  . History  of pneumonia 04/30/2016  . Grief reaction 04/30/2016  . Vitamin D deficiency 02/22/2015  . Hyperlipidemia 01/01/2015  . Visit for preventive health examination 12/04/2013  . Lower extremity edema 09/02/2013  . Essential hypertension 09/02/2013  . History of cardiomyopathy 09/02/2013  . SVT (supraventricular tachycardia) (HCC) 09/02/2013  . Palpitations 09/02/2013  . Contrast media allergy 03/10/2013  . Fibrocystic breast disease   . Obesity (BMI 30-39.9) 02/05/2012  .  Meniere disease    Pura Spice, PT, DPT # [id number] 04/14/2019, 1:34 PM  [facility] [address] [place], [place], [address] Phone: [phone]   Fax:  [phone]  Name: TROYCE FEBO MRN: [id number] Date of Birth: 1965/12/20

## 2019-04-18 ENCOUNTER — Encounter: Payer: BC Managed Care – PPO | Admitting: Physical Therapy

## 2019-04-19 ENCOUNTER — Encounter: Payer: BC Managed Care – PPO | Admitting: Physical Therapy

## 2019-04-19 ENCOUNTER — Ambulatory Visit: Payer: BC Managed Care – PPO | Admitting: Physical Therapy

## 2019-04-21 ENCOUNTER — Other Ambulatory Visit: Payer: Self-pay

## 2019-04-21 ENCOUNTER — Ambulatory Visit: Payer: BC Managed Care – PPO | Admitting: Physical Therapy

## 2019-04-21 ENCOUNTER — Encounter: Payer: Self-pay | Admitting: Physical Therapy

## 2019-04-21 DIAGNOSIS — M2560 Stiffness of unspecified joint, not elsewhere classified: Secondary | ICD-10-CM

## 2019-04-21 DIAGNOSIS — M79601 Pain in right arm: Secondary | ICD-10-CM | POA: Diagnosis not present

## 2019-04-21 DIAGNOSIS — M6281 Muscle weakness (generalized): Secondary | ICD-10-CM

## 2019-04-21 NOTE — Therapy (Signed)
[facility] [address] [place], [place], [address] Phone: [phone]   Fax:  [phone]  Physical Therapy Treatment  Patient Details  Name: Amanda Humphrey MRN: [id number] Date of Birth: 01-07-1966 Referring Provider (PT): Remo Lipps, NP   Encounter Date: 04/21/2019  Treatment: 10 of 18.  Recert date: 2/50/5397 0730 to 6734   Past Medical History:  Diagnosis Date  . Allergy   . Chronic kidney disease    stones  . Fibrocystic breast disease 2013  . Meniere disease   . Migraines     Past Surgical History:  Procedure Laterality Date  . ABDOMINAL HYSTERECTOMY  2011   laprascopic supracervical, [place]  . APPENDECTOMY  1999  . BREAST BIOPSY Right Nov 2012    fibrocystic disease, [place]  . CESAREAN SECTION     4 total  . CHOLECYSTECTOMY  2011  . TONSILLECTOMY AND ADENOIDECTOMY  1975    There were no vitals filed for this visit.      [facility] PT Assessment - 04/23/19 0001      Assessment   Medical Diagnosis  Herniation of cervical intervertebral disc w/ radiculopathy    Referring Provider (PT)  Remo Lipps, NP    Onset Date/Surgical Date  02/01/19      Prior Function   Level of Independence  Independent      Cognition   Overall Cognitive Status  Within Functional Limits for tasks assessed         Pt. has returned to working full hours and reports she has already worked 40 hours this week. Pt. is working from home today and tomorrow.        There.Ex:  Discussed work/ HEP Nautilus: 40# lat. Pull down/ 20# tricep ext./ 20# sh. Ext./ 20# sh. Adduction/ 30# scap. Retraction 30x each.  Cuing to proper technique/ breathing/ prevent UT overcompensation.   Standing B shoulder flexion/ abduction/ extension 10x Supine cervical manual isometrics (light)- rotn./ lateral flexion/ chin tucks5x each with short holds.  Manual Therapy:  Supine STM to bilateral UT/ cervical paraspinals (base of  occiput)-  8mnutes Supine AA/PROM of cervical spine (flexion, lateral flexion, rotation, extension) 10 second holds x 3 each direction Prone STM to B UT/ Hypervolt use.  Supine L/RUTstretches 3x each.     PT Long Term Goals - 04/23/19 0809      PT LONG TERM GOAL #1   Title  Pt. will improve FOTO score to >55 to improve independence with ADLs    Baseline  Score 34/Goal 55.  3/25: 46 (improvement noted.    Time  4    Period  Weeks    Status  Partially Met    Target Date  05/19/19      PT LONG TERM GOAL #2   Title  Pt. will be able to improve R cervical rotation to >70 degs to be able to be able to improve ROM for driving    Baseline  R 55 deg. L 70 deg.  3/25: cervical AROM WFL (all planes)- R rotn. 70 deg. (slight c/o tightness/ discomfort).    Time  4    Period  Weeks    Status  Achieved    Target Date  05/19/19      PT LONG TERM GOAL #3   Title  Pt. will be able to maintain upright posture with scapular retraction for >20 minutes to improve seated posture for work.    Baseline  Pt. presents with  forward head and rounded shoulders and increased thoracic kyphosis.    Time  4    Period  Weeks    Status  On-going    Target Date  05/19/19      PT LONG TERM GOAL #4   Title  Pt. will improve R grip strength by 30% to improve ability to grasp coffee mug    Baseline  R 39#  L 82#    Time  4    Period  Weeks    Status  On-going    Target Date  05/19/19          Plan - 04/23/19 0802    Clinical Impression Statement  Pt. continues to show slow but consistent progress with cervical AROM/ B UE muscle strengthening/ posture.  Pt. remains pain limited/ focused in B UT musculature with work-related tasks.  Pt. has returned to work full-time without restriction.  Pt. understands importance of proper posture/ workplance ergonomics to prevent cervical flexion/ poor posture.  See updated PT goals.  Pt. will benefit from continued PT services to increase pain-free cervical mobility.     Personal Factors and Comorbidities  Comorbidity 2;Profession;Past/Current Experience    Comorbidities  Hypertension, Obesity    Examination-Activity Limitations  Sleep;Reach Overhead;Lift;Bed Mobility    Stability/Clinical Decision Making  Evolving/Moderate complexity    Clinical Decision Making  Moderate    Rehab Potential  Good    PT Frequency  2x / week    PT Duration  4 weeks    PT Treatment/Interventions  ADLs/Self Care Home Management;Aquatic Therapy;Biofeedback;Cryotherapy;Electrical Stimulation;Moist Heat;Ultrasound;Functional mobility training;Therapeutic activities;Therapeutic exercise;Balance training;Neuromuscular re-education;Patient/family education;Manual techniques;Scar mobilization;Passive range of motion;Dry needling;Energy conservation;Taping    PT Next Visit Plan  Posture/ strengthening ex. program.  Discuss desk ergonomics    PT Home Exercise Plan  [id number]       Patient will benefit from skilled therapeutic intervention in order to improve the following deficits and impairments:  Decreased mobility, Decreased endurance, Hypomobility, Increased muscle spasms, Impaired sensation, Decreased range of motion, Decreased scar mobility, Impaired tone, Improper body mechanics, Postural dysfunction, Impaired flexibility, Decreased strength, Decreased activity tolerance  Visit Diagnosis: Muscle weakness (generalized)  Joint stiffness of spine  Pain in right arm     Problem List Patient Active Problem List   Diagnosis Date Noted  . H/O of hemilaminectomy 02/15/2019  . 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[place]) 11/17/2018  . Fatigue 09/23/2017  . History of pneumonia 04/30/2016  . Grief reaction 04/30/2016  . Vitamin D deficiency 02/22/2015  . Hyperlipidemia 01/01/2015  . Visit for preventive health examination 12/04/2013  . Lower extremity edema 09/02/2013  . Essential hypertension 09/02/2013  . History of cardiomyopathy 09/02/2013  . SVT (supraventricular  tachycardia) ([place]) 09/02/2013  . Palpitations 09/02/2013  . Contrast media allergy 03/10/2013  . Fibrocystic breast disease   . Obesity (BMI 30-39.9) 02/05/2012  . Meniere disease    Pura Spice, PT, DPT # [phone] 04/23/2019, 8:12 AM  [facility] [address] [place], [place], [address] Phone: [phone]   Fax:  [phone]  Name: KARALYN KADEL MRN: [id number] Date of Birth: Aug 29, 1965

## 2019-04-25 ENCOUNTER — Ambulatory Visit: Payer: BC Managed Care – PPO | Admitting: Physical Therapy

## 2019-04-25 ENCOUNTER — Other Ambulatory Visit: Payer: Self-pay

## 2019-04-25 DIAGNOSIS — M79601 Pain in right arm: Secondary | ICD-10-CM | POA: Diagnosis not present

## 2019-04-25 DIAGNOSIS — M2560 Stiffness of unspecified joint, not elsewhere classified: Secondary | ICD-10-CM | POA: Diagnosis not present

## 2019-04-25 DIAGNOSIS — M6281 Muscle weakness (generalized): Secondary | ICD-10-CM | POA: Diagnosis not present

## 2019-04-25 NOTE — Therapy (Signed)
[facility] [address] [place], [place], [address] Phone: [phone]   Fax:  [phone]  Physical Therapy Treatment  Patient Details  Name: Amanda Humphrey MRN: [id number] Date of Birth: 1965-07-14 Referring Provider (PT): Remo Lipps, NP   Encounter Date: 04/25/2019  PT End of Session - 04/27/19 1939    Visit Number  11    Number of Visits  18    Date for PT Re-Evaluation  05/19/19    Authorization - Visit Number  2    Authorization - Number of Visits  10    PT Start Time  0732    PT Stop Time  0820    PT Time Calculation (min)  48 min    Activity Tolerance  Patient tolerated treatment well;Patient limited by pain    Behavior During Therapy  [facility] for tasks assessed/performed       Past Medical History:  Diagnosis Date  . Allergy   . Chronic kidney disease    stones  . Fibrocystic breast disease 2013  . Meniere disease   . Migraines     Past Surgical History:  Procedure Laterality Date  . ABDOMINAL HYSTERECTOMY  2011   laprascopic supracervical, [place]  . APPENDECTOMY  1999  . BREAST BIOPSY Right Nov 2012    fibrocystic disease, Pat Patrick  . CESAREAN SECTION     4 total  . CHOLECYSTECTOMY  2011  . TONSILLECTOMY AND ADENOIDECTOMY  1975    There were no vitals filed for this visit.  Subjective Assessment - 04/27/19 1937    Subjective  Pt. states neck pain continues to be primary limiting factor.  Pt. presents with good cervical AROM all planes [facility]).  Pt. c/o tightness and discomfort in B UT musculature.  Pt. return to MD for f/u this Thursday.    Limitations  Lifting;House hold activities    Patient Stated Goals  Decrease muscle spasms, return to work    Currently in Pain?  Yes    Pain Score  7     Pain Location  Neck    Pain Orientation  Right;Left    Pain Descriptors / Indicators  Aching    Pain Onset  More than a month ago    Pain Onset  More than a month ago            There.Ex:  Cervical AROM WFL (reassessment of ROM in seated posture).  Palpation of UT/levator musculature.  Nautilus: 40# lat. Pull down/ 30# tricep ext./ 20# sh. Ext./ 20# sh. Adduction/ 30# scap. Retraction 30x each.  Cuing to proper technique/ breathing/ prevent UT overcompensation.   Standing B shoulder flexion/ abduction/ extension 10x Supine cervical manual isometrics (light)- rotn./ lateral flexion/ chin tucks5x each with short holds.  Manual Therapy:  Supine STM to bilateralUT/ cervical paraspinals (base of occiput)- 26mnutes.  Supine L/RUTstretches 3x each. SupineAA/PROM of cervical spine (flexion, lateral flexion, rotation, extension) 10 second holds x 3 each direction Prone STM to B UT/ Hypervolt use.       PT Long Term Goals - 04/23/19 0809      PT LONG TERM GOAL #1   Title  Pt. will improve FOTO score to >55 to improve independence with ADLs    Baseline  Score 34/Goal 55.  3/25: 46 (improvement noted.    Time  4    Period  Weeks    Status  Partially Met    Target Date  05/19/19  PT LONG TERM GOAL #2   Title  Pt. will be able to improve R cervical rotation to >70 degs to be able to be able to improve ROM for driving    Baseline  R 55 deg. L 70 deg.  3/25: cervical AROM WFL (all planes)- R rotn. 70 deg. (slight c/o tightness/ discomfort).    Time  4    Period  Weeks    Status  Achieved    Target Date  05/19/19      PT LONG TERM GOAL #3   Title  Pt. will be able to maintain upright posture with scapular retraction for >20 minutes to improve seated posture for work.    Baseline  Pt. presents with forward head and rounded shoulders and increased thoracic kyphosis.    Time  4    Period  Weeks    Status  On-going    Target Date  05/19/19      PT LONG TERM GOAL #4   Title  Pt. will improve R grip strength by 30% to improve ability to grasp coffee mug    Baseline  R 39#  L 82#    Time  4    Period  Weeks    Status  On-going    Target  Date  05/19/19            Plan - 04/27/19 1940    Clinical Impression Statement  Generalized B UT muscle tightness and discomfort with STM.  Pt. presents with functional cervical AROM (all planes) and forward head posture during seated posture/ phone use.  PT has not changed strengthening HEP at this time due to persistent c/o pain.    Personal Factors and Comorbidities  Comorbidity 2;Profession;Past/Current Experience    Comorbidities  Hypertension, Obesity    Examination-Activity Limitations  Sleep;Reach Overhead;Lift;Bed Mobility    Stability/Clinical Decision Making  Evolving/Moderate complexity    Clinical Decision Making  Moderate    Rehab Potential  Good    PT Frequency  2x / week    PT Duration  4 weeks    PT Treatment/Interventions  ADLs/Self Care Home Management;Aquatic Therapy;Biofeedback;Cryotherapy;Electrical Stimulation;Moist Heat;Ultrasound;Functional mobility training;Therapeutic activities;Therapeutic exercise;Balance training;Neuromuscular re-education;Patient/family education;Manual techniques;Scar mobilization;Passive range of motion;Dry needling;Energy conservation;Taping    PT Next Visit Plan  Posture/ strengthening ex. program.  Discuss desk ergonomics and MD f/u.    PT Home Exercise Plan  [id number]       Patient will benefit from skilled therapeutic intervention in order to improve the following deficits and impairments:  Decreased mobility, Decreased endurance, Hypomobility, Increased muscle spasms, Impaired sensation, Decreased range of motion, Decreased scar mobility, Impaired tone, Improper body mechanics, Postural dysfunction, Impaired flexibility, Decreased strength, Decreased activity tolerance  Visit Diagnosis: Muscle weakness (generalized)  Joint stiffness of spine  Pain in right arm     Problem List Patient Active Problem List   Diagnosis Date Noted  . H/O of hemilaminectomy 02/15/2019  . 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[place]) 11/17/2018   . Fatigue 09/23/2017  . History of pneumonia 04/30/2016  . Grief reaction 04/30/2016  . Vitamin D deficiency 02/22/2015  . Hyperlipidemia 01/01/2015  . Visit for preventive health examination 12/04/2013  . Lower extremity edema 09/02/2013  . Essential hypertension 09/02/2013  . History of cardiomyopathy 09/02/2013  . SVT (supraventricular tachycardia) ([place]) 09/02/2013  . Palpitations 09/02/2013  . Contrast media allergy 03/10/2013  . Fibrocystic breast disease   . Obesity (BMI 30-39.9) 02/05/2012  . Meniere disease    Pura Spice,  PT, DPT # [id number] 04/27/2019, 7:43 PM  [facility] [address] [place], [place], [address] Phone: [phone]   Fax:  [phone]  Name: JAKAILA NORMENT MRN: [id number] Date of Birth: [birth year]

## 2019-04-28 DIAGNOSIS — M25511 Pain in right shoulder: Secondary | ICD-10-CM | POA: Diagnosis not present

## 2019-04-28 DIAGNOSIS — M5030 Other cervical disc degeneration, unspecified cervical region: Secondary | ICD-10-CM | POA: Diagnosis not present

## 2019-04-28 DIAGNOSIS — Z9889 Other specified postprocedural states: Secondary | ICD-10-CM | POA: Diagnosis not present

## 2019-04-28 DIAGNOSIS — M47812 Spondylosis without myelopathy or radiculopathy, cervical region: Secondary | ICD-10-CM | POA: Diagnosis not present

## 2019-04-28 DIAGNOSIS — G8929 Other chronic pain: Secondary | ICD-10-CM | POA: Diagnosis not present

## 2019-04-29 ENCOUNTER — Ambulatory Visit: Payer: BC Managed Care – PPO | Attending: Acute Care | Admitting: Physical Therapy

## 2019-04-29 DIAGNOSIS — M2560 Stiffness of unspecified joint, not elsewhere classified: Secondary | ICD-10-CM | POA: Insufficient documentation

## 2019-04-29 DIAGNOSIS — M6281 Muscle weakness (generalized): Secondary | ICD-10-CM

## 2019-04-29 DIAGNOSIS — M79601 Pain in right arm: Secondary | ICD-10-CM | POA: Diagnosis not present

## 2019-04-29 NOTE — Therapy (Signed)
[facility] [address] [place], [place], [address] Phone: [phone]   Fax:  [phone]  Physical Therapy Treatment  Patient Details  Name: Amanda Humphrey MRN: [id number] Date of Birth: 1965/04/04 Referring Provider (PT): Remo Lipps, NP   Encounter Date: 04/29/2019  PT End of Session - 05/01/19 2014    Visit Number  12    Number of Visits  18    Date for PT Re-Evaluation  05/19/19    Authorization - Visit Number  3    Authorization - Number of Visits  10    PT Start Time  0731    PT Stop Time  0818    PT Time Calculation (min)  47 min    Activity Tolerance  Patient tolerated treatment well;Patient limited by pain    Behavior During Therapy  [facility] for tasks assessed/performed       Past Medical History:  Diagnosis Date  . Allergy   . Chronic kidney disease    stones  . Fibrocystic breast disease 2013  . Meniere disease   . Migraines     Past Surgical History:  Procedure Laterality Date  . ABDOMINAL HYSTERECTOMY  2011   laprascopic supracervical, [place]  . APPENDECTOMY  1999  . BREAST BIOPSY Right Nov 2012    fibrocystic disease, Pat Patrick  . CESAREAN SECTION     4 total  . CHOLECYSTECTOMY  2011  . TONSILLECTOMY AND ADENOIDECTOMY  1975    There were no vitals filed for this visit.  Subjective Assessment - 05/01/19 2012    Subjective  Pt. reports MD appts. went well.  No change in POC and pt. will continue PT 1x/week.  Pt. states neck pain continues to be primary limiting factor.    Limitations  Lifting;House hold activities    Patient Stated Goals  Decrease muscle spasms, return to work    Currently in Pain?  Yes    Pain Score  7     Pain Location  Neck    Pain Orientation  Right;Left    Pain Descriptors / Indicators  Aching    Pain Onset  More than a month ago    Pain Onset  More than a month ago         There.Ex:  Seated B shoulder AROM (all planes WFL).  Seated 3# bicep curls/ press-ups/  punches 10x2 each.  Nautilus: 40# lat. Pull down/ 30# tricep ext. (increase R hand tingling/ ulnar nerve)/ 20# sh. Adduction/ 30# scap. Retraction 30xeach.  Supine cervical manual isometrics (light)- rotn./ lateral flexion/ chin tucks5x each with short holds.  Manual Therapy:  Supine UT nerve glides on R UE (ulnar, median, radial) Supine STM to bilateralUT/ cervical paraspinals (base of occiput)-40mnutes.  Supine L/RUTstretches 3x each. SupineAA/PROM of cervical spine (flexion, lateral flexion, rotation, extension) 10 second holds x 3 each direction Prone STM to B UT/ Hypervolt use.      PT Long Term Goals - 04/23/19 0809      PT LONG TERM GOAL #1   Title  Pt. will improve FOTO score to >55 to improve independence with ADLs    Baseline  Score 34/Goal 55.  3/25: 46 (improvement noted.    Time  4    Period  Weeks    Status  Partially Met    Target Date  05/19/19      PT LONG TERM GOAL #2   Title  Pt. will be able to improve R cervical  rotation to >70 degs to be able to be able to improve ROM for driving    Baseline  R 55 deg. L 70 deg.  3/25: cervical AROM WFL (all planes)- R rotn. 70 deg. (slight c/o tightness/ discomfort).    Time  4    Period  Weeks    Status  Achieved    Target Date  05/19/19      PT LONG TERM GOAL #3   Title  Pt. will be able to maintain upright posture with scapular retraction for >20 minutes to improve seated posture for work.    Baseline  Pt. presents with forward head and rounded shoulders and increased thoracic kyphosis.    Time  4    Period  Weeks    Status  On-going    Target Date  05/19/19      PT LONG TERM GOAL #4   Title  Pt. will improve R grip strength by 30% to improve ability to grasp coffee mug    Baseline  R 39#  L 82#    Time  4    Period  Weeks    Status  On-going    Target Date  05/19/19            Plan - 05/01/19 2016    Clinical Impression Statement  PT focused on UE/ postural strengthening in standing and  seated posture.  Pt. reports slight increase in neck symptoms/ R ulnar nerve discomfort with resisted sh. adduction/ press-ups.  Pt. able to progress to 3# dumbbells in seated posture and supine shoulder horizontal adduction/ abduction.  B UT trigger points noted and PT discussed the benefits of dry needling.  Pt. will be 3 months s/p surgery next tx. session to start needling.    Personal Factors and Comorbidities  Comorbidity 2;Profession;Past/Current Experience    Comorbidities  Hypertension, Obesity    Examination-Activity Limitations  Sleep;Reach Overhead;Lift;Bed Mobility    Stability/Clinical Decision Making  Evolving/Moderate complexity    Rehab Potential  Good    PT Frequency  2x / week    PT Duration  4 weeks    PT Treatment/Interventions  ADLs/Self Care Home Management;Aquatic Therapy;Biofeedback;Cryotherapy;Electrical Stimulation;Moist Heat;Ultrasound;Functional mobility training;Therapeutic activities;Therapeutic exercise;Balance training;Neuromuscular re-education;Patient/family education;Manual techniques;Scar mobilization;Passive range of motion;Dry needling;Energy conservation;Taping    PT Next Visit Plan  Posture/ strengthening ex. program.  Discuss desk ergonomics/ Dry needling to UT.    PT Home Exercise Plan  [id number]       Patient will benefit from skilled therapeutic intervention in order to improve the following deficits and impairments:  Decreased mobility, Decreased endurance, Hypomobility, Increased muscle spasms, Impaired sensation, Decreased range of motion, Decreased scar mobility, Impaired tone, Improper body mechanics, Postural dysfunction, Impaired flexibility, Decreased strength, Decreased activity tolerance  Visit Diagnosis: Muscle weakness (generalized)  Joint stiffness of spine  Pain in right arm     Problem List Patient Active Problem List   Diagnosis Date Noted  . H/O of hemilaminectomy 02/15/2019  . 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[place])  11/17/2018  . Fatigue 09/23/2017  . History of pneumonia 04/30/2016  . Grief reaction 04/30/2016  . Vitamin D deficiency 02/22/2015  . Hyperlipidemia 01/01/2015  . Visit for preventive health examination 12/04/2013  . Lower extremity edema 09/02/2013  . Essential hypertension 09/02/2013  . History of cardiomyopathy 09/02/2013  . SVT (supraventricular tachycardia) ([place]) 09/02/2013  . Palpitations 09/02/2013  . Contrast media allergy 03/10/2013  . Fibrocystic breast disease   . Obesity (BMI 30-39.9) 02/05/2012  . Meniere  disease    Pura Spice, PT, DPT # [id number] 05/01/2019, 8:34 PM  [facility] [address] [place], [place], [address] Phone: [phone]   Fax:  [phone]  Name: DEVEN AUDI MRN: [id number] Date of Birth: November 27, 1965

## 2019-05-06 ENCOUNTER — Encounter: Payer: Self-pay | Admitting: Physical Therapy

## 2019-05-06 ENCOUNTER — Ambulatory Visit: Payer: BC Managed Care – PPO | Admitting: Physical Therapy

## 2019-05-06 DIAGNOSIS — M2560 Stiffness of unspecified joint, not elsewhere classified: Secondary | ICD-10-CM

## 2019-05-06 DIAGNOSIS — M6281 Muscle weakness (generalized): Secondary | ICD-10-CM | POA: Diagnosis not present

## 2019-05-06 DIAGNOSIS — M79601 Pain in right arm: Secondary | ICD-10-CM | POA: Diagnosis not present

## 2019-05-06 NOTE — Therapy (Signed)
[facility] [address] [place], [place], [address] Phone: [phone]   Fax:  [phone]  Physical Therapy Treatment  Patient Details  Name: Amanda Humphrey MRN: [id number] Date of Birth: [birth year] Referring Provider (PT): Remo Lipps, NP   Encounter Date: 05/06/2019  PT End of Session - 05/06/19 1937    Visit Number  13    Number of Visits  18    Date for PT Re-Evaluation  05/19/19    Authorization - Visit Number  4    Authorization - Number of Visits  10    PT Start Time  9379    PT Stop Time  0942    PT Time Calculation (min)  58 min    Activity Tolerance  Patient tolerated treatment well;Patient limited by pain    Behavior During Therapy  [facility] for tasks assessed/performed       Past Medical History:  Diagnosis Date  . Allergy   . Chronic kidney disease    stones  . Fibrocystic breast disease 2013  . Meniere disease   . Migraines     Past Surgical History:  Procedure Laterality Date  . ABDOMINAL HYSTERECTOMY  2011   laprascopic supracervical, [place]  . APPENDECTOMY  1999  . BREAST BIOPSY Right Nov 2012    fibrocystic disease, Pat Patrick  . CESAREAN SECTION     4 total  . CHOLECYSTECTOMY  2011  . TONSILLECTOMY AND ADENOIDECTOMY  1975    There were no vitals filed for this visit.  Subjective Assessment - 05/06/19 1935    Subjective  Pt. states she was doing well prior to picking up a 5 gallon gas container from husband truck bed.  Pt. states she feels she strained neck/ UT musculature.  Pt. has improved since incident but still sore.    Limitations  Lifting;House hold activities    Patient Stated Goals  Decrease muscle spasms, return to work    Currently in Pain?  Yes   no subjective pain score given.   Pain Onset  More than a month ago    Pain Onset  More than a month ago          There.Ex:  Seated B shoulder AROM (all planes)- palpation of UT/ reassessment of scapular mobility.  Nautilus:  40# lat. Pull down/ 40# sh. Ext./30# tricep ext./ 30# sh. Adduction/ 30# scap. Retraction 30xeach.  Supine 3# press-ups/ alt. UE sh. Flexion/ horizontal abduction 20x each.   Reviewed HEP  Manual Therapy:  Seated/supine STM to bilateralUT/ cervical paraspinals (base of occiput)-11mnutes. Supine L/RUT/ levatorstretches 3x each. SupineAA/PROM of cervical spine (flexion, lateral flexion, rotation, extension) 10 second holds x 3 each direction Seated Hypervolt use to B rhomboid/ UT musculature (no issues).    PT Long Term Goals - 04/23/19 0809      PT LONG TERM GOAL #1   Title  Pt. will improve FOTO score to >55 to improve independence with ADLs    Baseline  Score 34/Goal 55.  3/25: 46 (improvement noted.    Time  4    Period  Weeks    Status  Partially Met    Target Date  05/19/19      PT LONG TERM GOAL #2   Title  Pt. will be able to improve R cervical rotation to >70 degs to be able to be able to improve ROM for driving    Baseline  R 55 deg. L 70 deg.  3/25: cervical  AROM WFL (all planes)- R rotn. 70 deg. (slight c/o tightness/ discomfort).    Time  4    Period  Weeks    Status  Achieved    Target Date  05/19/19      PT LONG TERM GOAL #3   Title  Pt. will be able to maintain upright posture with scapular retraction for >20 minutes to improve seated posture for work.    Baseline  Pt. presents with forward head and rounded shoulders and increased thoracic kyphosis.    Time  4    Period  Weeks    Status  On-going    Target Date  05/19/19      PT LONG TERM GOAL #4   Title  Pt. will improve R grip strength by 30% to improve ability to grasp coffee mug    Baseline  R 39#  L 82#    Time  4    Period  Weeks    Status  On-going    Target Date  05/19/19            Plan - 05/06/19 1938    Clinical Impression Statement  Pt. presents with generalized L sided neck pain with palpation as compared to R UT.  PT instructed in proper posture/ technique with  Nautilus UE strengthening ex. prior to manual stretches.  Good tx. tolerance with use of Hypervolt to R mid-scapular/ trap. musculature.  Pt. instructed to avoid any heavy lifting at home and continue with HEP on a consistent basis.    Personal Factors and Comorbidities  Comorbidity 2;Profession;Past/Current Experience    Comorbidities  Hypertension, Obesity    Examination-Activity Limitations  Sleep;Reach Overhead;Lift;Bed Mobility    Stability/Clinical Decision Making  Evolving/Moderate complexity    Clinical Decision Making  Moderate    Rehab Potential  Good    PT Frequency  2x / week    PT Duration  4 weeks    PT Treatment/Interventions  ADLs/Self Care Home Management;Aquatic Therapy;Biofeedback;Cryotherapy;Electrical Stimulation;Moist Heat;Ultrasound;Functional mobility training;Therapeutic activities;Therapeutic exercise;Balance training;Neuromuscular re-education;Patient/family education;Manual techniques;Scar mobilization;Passive range of motion;Dry needling;Energy conservation;Taping    PT Next Visit Plan  Posture/ strengthening ex. program.  Discuss desk ergonomics/ Dry needling to UT.    PT Home Exercise Plan  [id number]       Patient will benefit from skilled therapeutic intervention in order to improve the following deficits and impairments:  Decreased mobility, Decreased endurance, Hypomobility, Increased muscle spasms, Impaired sensation, Decreased range of motion, Decreased scar mobility, Impaired tone, Improper body mechanics, Postural dysfunction, Impaired flexibility, Decreased strength, Decreased activity tolerance  Visit Diagnosis: Muscle weakness (generalized)  Joint stiffness of spine  Pain in right arm     Problem List Patient Active Problem List   Diagnosis Date Noted  . H/O of hemilaminectomy 02/15/2019  . 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[place]) 11/17/2018  . Fatigue 09/23/2017  . History of pneumonia 04/30/2016  . Grief reaction 04/30/2016  . Vitamin D  deficiency 02/22/2015  . Hyperlipidemia 01/01/2015  . Visit for preventive health examination 12/04/2013  . Lower extremity edema 09/02/2013  . Essential hypertension 09/02/2013  . History of cardiomyopathy 09/02/2013  . SVT (supraventricular tachycardia) ([place]) 09/02/2013  . Palpitations 09/02/2013  . Contrast media allergy 03/10/2013  . Fibrocystic breast disease   . Obesity (BMI 30-39.9) 02/05/2012  . Meniere disease    Pura Spice, PT, DPT # [phone] 05/06/2019, 7:41 PM  [facility] [address] [place], [place], [address] Phone: [phone]  Fax:  [phone]  Name: Amanda Humphrey MRN: [id number] Date of Birth: 05/24/1965

## 2019-05-13 ENCOUNTER — Encounter: Payer: Self-pay | Admitting: Physical Therapy

## 2019-05-13 ENCOUNTER — Ambulatory Visit: Payer: BC Managed Care – PPO | Admitting: Physical Therapy

## 2019-05-13 ENCOUNTER — Other Ambulatory Visit: Payer: Self-pay

## 2019-05-13 DIAGNOSIS — M6281 Muscle weakness (generalized): Secondary | ICD-10-CM | POA: Diagnosis not present

## 2019-05-13 DIAGNOSIS — M79601 Pain in right arm: Secondary | ICD-10-CM

## 2019-05-13 DIAGNOSIS — M2560 Stiffness of unspecified joint, not elsewhere classified: Secondary | ICD-10-CM

## 2019-05-13 NOTE — Therapy (Signed)
[facility] [address]. [place], [place], [address] Phone: [phone]   Fax:  [phone]  Physical Therapy Treatment  Patient Details  Name: Amanda Humphrey MRN: [id number] Date of Birth: [birth year] Referring Provider (PT): Remo Lipps, NP   Encounter Date: 05/13/2019   Treatment: 14 of 18.  Recert date: 9/92/4268 0731 to 0820   Past Medical History:  Diagnosis Date  . Allergy   . Chronic kidney disease    stones  . Fibrocystic breast disease 2013  . Meniere disease   . Migraines     Past Surgical History:  Procedure Laterality Date  . ABDOMINAL HYSTERECTOMY  2011   laprascopic supracervical, DeFrancesco  . APPENDECTOMY  1999  . BREAST BIOPSY Right Nov 2012    fibrocystic disease, Pat Patrick  . CESAREAN SECTION     4 total  . CHOLECYSTECTOMY  2011  . TONSILLECTOMY AND ADENOIDECTOMY  1975    There were no vitals filed for this visit.    Pt. reports no new issues. Pt. has had a busy week at work, esp. on Wednesday (long day). Pt. has joined [employer] and has gone 2x/week.       There.Ex:  Standing B shoulder AROM(all planes)- palpation of UT/ reassessment of scapular mobility.  Nautilus: 40# lat. Pull down/30# tricep ext./ 30# sh. Adduction/ 30# scap. Retraction/ 20# bicep curls 30xeach.  Supine 3# press-ups/ alt. UE sh. Flexion/ horizontal abduction 20x each.   Supine cervical isometrics (rotn./ lateral flexion)- 5x with manual resistance (light)- no pain  Manual Therapy:  Seated/supine STM to bilateralUT/ cervical paraspinals (base of occiput). Supine L/RUT/ levatorstretches 3x each. Seated Hypervolt use to B rhomboid/ UT musculature (no pain reported).     PT Long Term Goals - 04/23/19 0809      PT LONG TERM GOAL #1   Title  Pt. will improve FOTO score to >55 to improve independence with ADLs    Baseline  Score 34/Goal 55.  3/25: 46 (improvement noted.    Time  4    Period   Weeks    Status  Partially Met    Target Date  05/19/19      PT LONG TERM GOAL #2   Title  Pt. will be able to improve R cervical rotation to >70 degs to be able to be able to improve ROM for driving    Baseline  R 55 deg. L 70 deg.  3/25: cervical AROM WFL (all planes)- R rotn. 70 deg. (slight c/o tightness/ discomfort).    Time  4    Period  Weeks    Status  Achieved    Target Date  05/19/19      PT LONG TERM GOAL #3   Title  Pt. will be able to maintain upright posture with scapular retraction for >20 minutes to improve seated posture for work.    Baseline  Pt. presents with forward head and rounded shoulders and increased thoracic kyphosis.    Time  4    Period  Weeks    Status  On-going    Target Date  05/19/19      PT LONG TERM GOAL #4   Title  Pt. will improve R grip strength by 30% to improve ability to grasp coffee mug    Baseline  R 39#  L 82#    Time  4    Period  Weeks    Status  On-going    Target Date  05/19/19         Pt. doing well with progressive UE resisted ex. program. Pt. maintaining upright posture and preventing UT compensation/ forward posture. Pt. reports R mid-scapular discomfort with manual stretches/ STM. Pt. encouraged to continue with a consistent 2x/week ex. program at [employer]. PT to reassess goals next tx. session.      Patient will benefit from skilled therapeutic intervention in order to improve the following deficits and impairments:  Decreased mobility, Decreased endurance, Hypomobility, Increased muscle spasms, Impaired sensation, Decreased range of motion, Decreased scar mobility, Impaired tone, Improper body mechanics, Postural dysfunction, Impaired flexibility, Decreased strength, Decreased activity tolerance  Visit Diagnosis: Muscle weakness (generalized)  Joint stiffness of spine  Pain in right arm     Problem List Patient Active Problem List   Diagnosis Date Noted  . H/O of hemilaminectomy 02/15/2019  . 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[place]) 11/17/2018  . Fatigue 09/23/2017  . History of pneumonia 04/30/2016  . Grief reaction 04/30/2016  . Vitamin D deficiency 02/22/2015  . Hyperlipidemia 01/01/2015  . Visit for preventive health examination 12/04/2013  . Lower extremity edema 09/02/2013  . Essential hypertension 09/02/2013  . History of cardiomyopathy 09/02/2013  . SVT (supraventricular tachycardia) ([place]) 09/02/2013  . Palpitations 09/02/2013  . Contrast media allergy 03/10/2013  . Fibrocystic breast disease   . Obesity (BMI 30-39.9) 02/05/2012  . Meniere disease    Pura Spice, PT, DPT # [phone] 05/16/2019, 7:01 PM  [facility] [address] [place], [place], [address] Phone: [phone]   Fax:  [phone]  Name: Amanda Humphrey MRN: [id number] Date of Birth: 13-Feb-1965

## 2019-05-17 DIAGNOSIS — F909 Attention-deficit hyperactivity disorder, unspecified type: Secondary | ICD-10-CM | POA: Diagnosis not present

## 2019-05-20 ENCOUNTER — Other Ambulatory Visit: Payer: Self-pay

## 2019-05-20 ENCOUNTER — Ambulatory Visit: Payer: BC Managed Care – PPO | Admitting: Physical Therapy

## 2019-05-20 ENCOUNTER — Encounter: Payer: Self-pay | Admitting: Physical Therapy

## 2019-05-20 DIAGNOSIS — M6281 Muscle weakness (generalized): Secondary | ICD-10-CM

## 2019-05-20 DIAGNOSIS — M2560 Stiffness of unspecified joint, not elsewhere classified: Secondary | ICD-10-CM | POA: Diagnosis not present

## 2019-05-20 DIAGNOSIS — M79601 Pain in right arm: Secondary | ICD-10-CM

## 2019-05-20 NOTE — Therapy (Signed)
[facility] [address] [place], [place], [address] Phone: [phone]   Fax:  [phone]  Physical Therapy Treatment  Patient Details  Name: Amanda Humphrey MRN: [id number] Date of Birth: [birth year] Referring Provider (PT): Remo Lipps, NP   Encounter Date: 05/20/2019  PT End of Session - 05/20/19 0729    Visit Number  15    Number of Visits  19    Date for PT Re-Evaluation  06/17/19    Authorization - Visit Number  1    Authorization - Number of Visits  10    PT Start Time  0730    PT Stop Time  0824    PT Time Calculation (min)  54 min    Activity Tolerance  Patient tolerated treatment well;Patient limited by pain    Behavior During Therapy  [facility] for tasks assessed/performed       Past Medical History:  Diagnosis Date  . Allergy   . Chronic kidney disease    stones  . Fibrocystic breast disease 2013  . Meniere disease   . Migraines     Past Surgical History:  Procedure Laterality Date  . ABDOMINAL HYSTERECTOMY  2011   laprascopic supracervical, DeFrancesco  . APPENDECTOMY  1999  . BREAST BIOPSY Right Nov 2012    fibrocystic disease, Pat Patrick  . CESAREAN SECTION     4 total  . CHOLECYSTECTOMY  2011  . TONSILLECTOMY AND ADENOIDECTOMY  1975    There were no vitals filed for this visit.  Subjective Assessment - 05/20/19 0729    Subjective  Pt. states she has continued to go to [employer].  Pt. had a long day at work the other day (prolonge siiting) resulting in neck discomfort.  Pt. is trying to get a supportive chair/ head support for work.    Limitations  Lifting;House hold activities    Patient Stated Goals  Decrease muscle spasms, return to work    Currently in Pain?  Yes    Pain Score  4     Pain Location  Neck    Pain Onset  More than a month ago    Pain Onset  More than a month ago         [facility] PT Assessment - 05/21/19 0001      Assessment   Medical Diagnosis  Herniation of cervical  intervertebral disc w/ radiculopathy    Referring Provider (PT)  Remo Lipps, NP    Onset Date/Surgical Date  02/01/19      Prior Function   Level of Independence  Independent      Cognition   Overall Cognitive Status  Within Functional Limits for tasks assessed          Manual Therapy:  Seated/supine STM to bilateralUT/ cervical paraspinals (base of occiput). Supine L/RUT/ levatorstretches 3x each. SeatedHypervolt useto B rhomboid/ UT musculature (no pain reported).   There.Ex:  Supine B shoulder AROM (flexion/ horizontal abduction)- 20x.  Supine 2# UE sh. Flexion (alt.)/ abduction 20x each Nautilus: 50# lat. Pull down/30# tricep ext./ 30# sh. Adduction/ 40# scap. Retraction/ 20# bicep curls 15x 2each. Supine cervical isometrics (rotn./ lateral flexion)- 5x with manual resistance (light)- no pain       PT Long Term Goals - 05/21/19 0806      PT LONG TERM GOAL #1   Title  Pt. will improve FOTO score to >55 to improve independence with ADLs    Baseline  Score 34/Goal  55.  3/25: 46 (improvement noted.    Time  4    Period  Weeks    Status  Partially Met    Target Date  06/17/19      PT LONG TERM GOAL #2   Title  Pt. will be able to improve R cervical rotation to >70 degs to be able to be able to improve ROM for driving    Baseline  R 55 deg. L 70 deg.  3/25: cervical AROM WFL (all planes)- R rotn. 70 deg. (slight c/o tightness/ discomfort).    Time  4    Period  Weeks    Status  Achieved      PT LONG TERM GOAL #3   Title  Pt. will be able to maintain upright posture with scapular retraction for >20 minutes to improve seated posture for work.    Baseline  Pt. presents with forward head and rounded shoulders and increased thoracic kyphosis.    Time  4    Period  Weeks    Status  Partially Met    Target Date  06/17/19      PT LONG TERM GOAL #4   Title  Pt. will improve R grip strength by 30% to improve ability to grasp coffee mug    Baseline   R 39#  L 82#    Time  4    Period  Weeks    Status  Partially Met    Target Date  06/17/19      PT LONG TERM GOAL #5   Title  Pt. will be independent with [employer] ex. program to progress UE/posture strengthening with no increase c/o pain.    Baseline  Pt. has attended [employer] for past couple weeks.    Time  4    Period  Weeks    Status  New    Target Date  06/17/19            Plan - 05/20/19 0730    Clinical Impression Statement  Good cervical AROM all planes.  Pt. progressing with B UE/posture ex. program but muscle weakness/ fatigue noted.  Pt. able to complete UE strengthening ex. program 2-3x/week at [employer] and PT clinic.  L UT muscle tightness noted during STM/ cervical stretches.  Pt. will benefit from continued PT over next few weeks with progression to an independent ex. program at [employer].    Personal Factors and Comorbidities  Comorbidity 2;Profession;Past/Current Experience    Comorbidities  Hypertension, Obesity    Examination-Activity Limitations  Sleep;Reach Overhead;Lift;Bed Mobility    Stability/Clinical Decision Making  Evolving/Moderate complexity    Clinical Decision Making  Moderate    Rehab Potential  Good    PT Frequency  2x / week    PT Duration  4 weeks    PT Treatment/Interventions  ADLs/Self Care Home Management;Aquatic Therapy;Biofeedback;Cryotherapy;Electrical Stimulation;Moist Heat;Ultrasound;Functional mobility training;Therapeutic activities;Therapeutic exercise;Balance training;Neuromuscular re-education;Patient/family education;Manual techniques;Scar mobilization;Passive range of motion;Dry needling;Energy conservation;Taping    PT Next Visit Plan  Posture/ strengthening ex. program.  Dry needling to UT.    PT Home Exercise Plan  [id number]       Patient will benefit from skilled therapeutic intervention in order to improve the following deficits and impairments:  Decreased mobility, Decreased endurance, Hypomobility,  Increased muscle spasms, Impaired sensation, Decreased range of motion, Decreased scar mobility, Impaired tone, Improper body mechanics, Postural dysfunction, Impaired flexibility, Decreased strength, Decreased activity tolerance  Visit Diagnosis: Muscle weakness (generalized)  Joint stiffness of  spine  Pain in right arm     Problem List Patient Active Problem List   Diagnosis Date Noted  . H/O of hemilaminectomy 02/15/2019  . 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[place]) 11/17/2018  . Fatigue 09/23/2017  . History of pneumonia 04/30/2016  . Grief reaction 04/30/2016  . Vitamin D deficiency 02/22/2015  . Hyperlipidemia 01/01/2015  . Visit for preventive health examination 12/04/2013  . Lower extremity edema 09/02/2013  . Essential hypertension 09/02/2013  . History of cardiomyopathy 09/02/2013  . SVT (supraventricular tachycardia) ([place]) 09/02/2013  . Palpitations 09/02/2013  . Contrast media allergy 03/10/2013  . Fibrocystic breast disease   . Obesity (BMI 30-39.9) 02/05/2012  . Meniere disease    Pura Spice, PT, DPT # [id number] 05/21/2019, 8:09 AM  [facility] [address] [place], [place], [address] Phone: [phone]   Fax:  [phone]  Name: BRITTA LOUTH MRN: [id number] Date of Birth: 1965-08-02

## 2019-05-26 ENCOUNTER — Telehealth: Payer: Self-pay | Admitting: Cardiovascular Disease

## 2019-05-26 ENCOUNTER — Other Ambulatory Visit
Admission: RE | Admit: 2019-05-26 | Discharge: 2019-05-26 | Disposition: A | Discharge status: Home / Self Care | Payer: BC Managed Care – PPO | Attending: Cardiovascular Disease | Admitting: Cardiovascular Disease

## 2019-05-26 ENCOUNTER — Other Ambulatory Visit: Payer: Self-pay

## 2019-05-26 DIAGNOSIS — Z131 Encounter for screening for diabetes mellitus: Secondary | ICD-10-CM | POA: Diagnosis not present

## 2019-05-26 DIAGNOSIS — Z1329 Encounter for screening for other suspected endocrine disorder: Secondary | ICD-10-CM | POA: Diagnosis not present

## 2019-05-26 DIAGNOSIS — Z13220 Encounter for screening for lipoid disorders: Secondary | ICD-10-CM | POA: Diagnosis not present

## 2019-05-26 DIAGNOSIS — R002 Palpitations: Secondary | ICD-10-CM | POA: Insufficient documentation

## 2019-05-26 DIAGNOSIS — Z8679 Personal history of other diseases of the circulatory system: Secondary | ICD-10-CM | POA: Insufficient documentation

## 2019-05-26 DIAGNOSIS — Z136 Encounter for screening for cardiovascular disorders: Secondary | ICD-10-CM | POA: Insufficient documentation

## 2019-05-26 DIAGNOSIS — Z1321 Encounter for screening for nutritional disorder: Secondary | ICD-10-CM | POA: Insufficient documentation

## 2019-05-26 LAB — COMPREHENSIVE METABOLIC PANEL
ALT: 18 U/L (ref 0–44)
AST: 17 U/L (ref 15–41)
Albumin: 4.2 g/dL (ref 3.5–5.0)
Alkaline Phosphatase: 81 U/L (ref 38–126)
Anion gap: 8 (ref 5–15)
BUN: 14 mg/dL (ref 6–20)
CO2: 27 mmol/L (ref 22–32)
Calcium: 9.4 mg/dL (ref 8.9–10.3)
Chloride: 102 mmol/L (ref 98–111)
Creatinine, Ser: 0.91 mg/dL (ref 0.44–1.00)
GFR calc Af Amer: >60 mL/min (ref >60)
GFR calc non Af Amer: >60 mL/min (ref >60)
Glucose, Bld: 98 mg/dL (ref 70–99)
Potassium: 4.2 mmol/L (ref 3.5–5.1)
Sodium: 137 mmol/L (ref 135–145)
Total Bilirubin: 1.2 mg/dL (ref 0.3–1.2)
Total Protein: 7 g/dL (ref 6.5–8.1)

## 2019-05-26 LAB — CBC WITH DIFFERENTIAL/PLATELET
Abs Immature Granulocytes: 0.01 10*3/uL (ref 0.00–0.07)
Basophils Absolute: 0.1 10*3/uL (ref 0.0–0.1)
Basophils Relative: 1 %
Eosinophils Absolute: 0.1 10*3/uL (ref 0.0–0.5)
Eosinophils Relative: 2 %
HCT: 40.3 % (ref 36.0–46.0)
Hemoglobin: 13.7 g/dL (ref 12.0–15.0)
Immature Granulocytes: 0 %
Lymphocytes Relative: 33 %
Lymphs Abs: 1.6 10*3/uL (ref 0.7–4.0)
MCH: 29.9 pg (ref 26.0–34.0)
MCHC: 34 g/dL (ref 30.0–36.0)
MCV: 88 fL (ref 80.0–100.0)
Monocytes Absolute: 0.2 10*3/uL (ref 0.1–1.0)
Monocytes Relative: 5 %
Neutro Abs: 2.9 10*3/uL (ref 1.7–7.7)
Neutrophils Relative %: 59 %
Platelets: 298 10*3/uL (ref 150–400)
RBC: 4.58 MIL/uL (ref 3.87–5.11)
RDW: 13.1 % (ref 11.5–15.5)
WBC: 4.8 10*3/uL (ref 4.0–10.5)
nRBC: 0 % (ref 0.0–0.2)

## 2019-05-26 LAB — LIPID PANEL
Cholesterol: 250 mg/dL — ABNORMAL HIGH (ref 0–200)
HDL: 61 mg/dL (ref >40)
LDL Cholesterol: 159 mg/dL — ABNORMAL HIGH (ref 0–99)
Total CHOL/HDL Ratio: 4.1 RATIO
Triglycerides: 151 mg/dL — ABNORMAL HIGH (ref <150)
VLDL: 30 mg/dL (ref 0–40)

## 2019-05-26 LAB — VITAMIN D 25 HYDROXY (VIT D DEFICIENCY, FRACTURES): Vit D, 25-Hydroxy: 22.56 ng/mL — ABNORMAL LOW (ref 30–100)

## 2019-05-26 LAB — TSH: TSH: 1.148 u[IU]/mL (ref 0.350–4.500)

## 2019-05-26 NOTE — Telephone Encounter (Signed)
Please call to discuss duplicate orders for BMET

## 2019-05-26 NOTE — Telephone Encounter (Signed)
Dr Mariah Milling had ordered repeat BMET for patient from telephone encounter on 03/16/19 after adjusting Potassium due to low potassium level.  Repeat labs drawn by PCP today included BMET. It will not result to Dr Mariah Milling directly so routing to Dr Mariah Milling to review in Epic.

## 2019-05-27 ENCOUNTER — Ambulatory Visit: Payer: BC Managed Care – PPO | Admitting: Physical Therapy

## 2019-05-27 ENCOUNTER — Encounter: Payer: Self-pay | Admitting: Physical Therapy

## 2019-05-27 DIAGNOSIS — M2560 Stiffness of unspecified joint, not elsewhere classified: Secondary | ICD-10-CM

## 2019-05-27 DIAGNOSIS — M79601 Pain in right arm: Secondary | ICD-10-CM

## 2019-05-27 DIAGNOSIS — M6281 Muscle weakness (generalized): Secondary | ICD-10-CM | POA: Diagnosis not present

## 2019-05-27 LAB — T3: T3, Total: 118 ng/dL (ref 71–180)

## 2019-05-27 LAB — T4: T4, Total: 8.3 ug/dL (ref 4.5–12.0)

## 2019-05-27 NOTE — Therapy (Addendum)
[facility] [address]. [place], [place], [address] Phone: [phone]   Fax:  [phone]  Physical Therapy Treatment  Patient Details  Name: Amanda Humphrey MRN: [id number] Date of Birth: 1965-02-17 Referring Provider (PT): Remo Lipps, NP   Encounter Date: 05/27/2019   Treatment: 16 of 19.  Recert date: 3/50/0938 1829 to 0825  Past Medical History:  Diagnosis Date  . Allergy   . Chronic kidney disease    stones  . Fibrocystic breast disease 2013  . Meniere disease   . Migraines     Past Surgical History:  Procedure Laterality Date  . ABDOMINAL HYSTERECTOMY  2011   laprascopic supracervical, DeFrancesco  . APPENDECTOMY  1999  . BREAST BIOPSY Right Nov 2012    fibrocystic disease, Pat Patrick  . CESAREAN SECTION     4 total  . CHOLECYSTECTOMY  2011  . TONSILLECTOMY AND ADENOIDECTOMY  1975    There were no vitals filed for this visit.    Pt. states she is weaning off the Gabapentin. Pt. reports R UT/UE has been hurting more during the day. Pt. reports increase R cervical/upper back "pull" during B shoulder flexion/ overhead tasks. Pt. has not been to [employer] this week due to work schedule.       Manual Therapy:  Seated/supine STM to bilateralUT/ cervical paraspinals (base of occiput). Supine L/RUT/ levatorstretches 3x each. SeatedHypervolt useto B rhomboid/ UT musculature (nopain reported).  There.Ex:  Nautilus: 50# lat. Pull down/30# tricep ext./ 30# sh. Adduction/ 40# scap. Retraction/ 40# chest press/ 20# bicep curls15x 2each. Supine B shoulder AROM (flexion/ horizontal abduction)- 20x.  Supine 3# UE sh. Flexion (alt.)/ abduction 20x each Grip: R 76.5#/ L 81# (marked improvement).    Reviewed HEP     PT Long Term Goals - 05/21/19 0806      PT LONG TERM GOAL #1   Title  Pt. will improve FOTO score to >55 to improve independence with ADLs    Baseline  Score 34/Goal 55.   3/25: 46 (improvement noted.    Time  4    Period  Weeks    Status  Partially Met    Target Date  06/17/19      PT LONG TERM GOAL #2   Title  Pt. will be able to improve R cervical rotation to >70 degs to be able to be able to improve ROM for driving    Baseline  R 55 deg. L 70 deg.  3/25: cervical AROM WFL (all planes)- R rotn. 70 deg. (slight c/o tightness/ discomfort).    Time  4    Period  Weeks    Status  Achieved      PT LONG TERM GOAL #3   Title  Pt. will be able to maintain upright posture with scapular retraction for >20 minutes to improve seated posture for work.    Baseline  Pt. presents with forward head and rounded shoulders and increased thoracic kyphosis.    Time  4    Period  Weeks    Status  Partially Met    Target Date  06/17/19      PT LONG TERM GOAL #4   Title  Pt. will improve R grip strength by 30% to improve ability to grasp coffee mug    Baseline  R 39#  L 82#    Time  4    Period  Weeks    Status  Partially Met  Target Date  06/17/19      PT LONG TERM GOAL #5   Title  Pt. will be independent with [employer] ex. program to progress UE/posture strengthening with no increase c/o pain.    Baseline  Pt. has attended [employer] for past couple weeks.    Time  4    Period  Weeks    Status  New    Target Date  06/17/19         R grip strength/ UE strength continues to improve with everyday household and work-related tasks. Good cervical AROM with L mid-cervical discomfort with hand placement during stretches/ palpation. Pt. reports slight increase in R UE nerve symptoms with overhead reaching/ R lateral flexion and rotation. Pt. instructed in proper lifting/ body mechanics to prevent neck/back pain and issues.      Patient will benefit from skilled therapeutic intervention in order to improve the following deficits and impairments:  Decreased mobility, Decreased endurance, Hypomobility, Increased muscle spasms, Impaired sensation, Decreased  range of motion, Decreased scar mobility, Impaired tone, Improper body mechanics, Postural dysfunction, Impaired flexibility, Decreased strength, Decreased activity tolerance  Visit Diagnosis: Muscle weakness (generalized)  Joint stiffness of spine  Pain in right arm     Problem List Patient Active Problem List   Diagnosis Date Noted  . H/O of hemilaminectomy 02/15/2019  . 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[place]) 11/17/2018  . Fatigue 09/23/2017  . History of pneumonia 04/30/2016  . Grief reaction 04/30/2016  . Vitamin D deficiency 02/22/2015  . Hyperlipidemia 01/01/2015  . Visit for preventive health examination 12/04/2013  . Lower extremity edema 09/02/2013  . Essential hypertension 09/02/2013  . History of cardiomyopathy 09/02/2013  . SVT (supraventricular tachycardia) ([place]) 09/02/2013  . Palpitations 09/02/2013  . Contrast media allergy 03/10/2013  . Fibrocystic breast disease   . Obesity (BMI 30-39.9) 02/05/2012  . Meniere disease    Pura Spice, PT, DPT # [id number] 06/06/2019, 10:23 AM  [facility] [address] [place], [place], [address] Phone: [phone]   Fax:  [phone]  Name: JOURDIN GENS MRN: [id number] Date of Birth: [birth year]

## 2019-05-28 NOTE — Progress Notes (Signed)
Cardiology Office Note  Date:  05/30/2019   ID:  Amanda Humphrey, DOB January 17, 1966, MRN [id number]  PCP:  Crecencio Mc, MD   Chief Complaint  Patient presents with  . OTHER    3 month f/u c/o edema ankles/hands. Meds reviewed verbally with pt.    HPI:  Amanda Humphrey is a very pleasant 54 year old woman with remote history of cardiomyopathy approximately 20 years ago after having her third child with mild depression of her ejection fraction which returned back to normal 6 months later,  periodic SVT, palpitations,  lower extremity swelling  who presents for routine follow-up of her arrhythmia, hypertension, fluid retention following neck surgery January 2021  Neck much better, 90% healed, Numb down right arm, hand, Doing PT  Still with some mild leg swelling, Trouble getting on TED hose Lab work reviewed Normal BMP On HCTZ and lasix 20 (to 40 daily on rare days) Creatinine normal  Total cholesterol 250  EKG personally reviewed by myself on todays visit Normal sinus rhythm with rate 78 bpm right bundle branch block  [facility] records from neurosurgery reviewed status post right C7-T1 discectomy and foraminotomy/hemilaminectomy February 08, 2019 Performed at [facility] procedure orthostatic hypotension, treated with fluid boluses Taking Valium, gabapentin for muscle spasms Recent office note from neurosurgery indicates she is on Lasix Working with physical therapy, initially using a walker for stability Residual numbness right hand, Improvement in grip and dexterity C8 nerve distribution And ability to tolerate narcotics  post decompression nerve root edema accentuated by postop fluid boluses for postop postural hypotension and apparent allergic reaction to Dilaudid and some rebound swelling upon completion of Decadron taper.  Prior history of cardiomyopathy many years ago that seemed to significantly improve with aggressive diuresis. Approximately 18 years ago, Lasix was provided with  16 pound weight loss and improvement of her symptoms. On her fourth child she had fluid retention but no recurrent cardiomyopathy  prior echocardiogram 12/16/2010 showing normal ejection fraction rate and 55%, mild MR   PMH:   has a past medical history of Allergy, Chronic kidney disease, Fibrocystic breast disease (2013), Meniere disease, and Migraines.  PSH:    Past Surgical History:  Procedure Laterality Date  . ABDOMINAL HYSTERECTOMY  2011   laprascopic supracervical, [place]  . APPENDECTOMY  1999  . BREAST BIOPSY Right Nov 2012    fibrocystic disease, [place]  . CESAREAN SECTION     4 total  . CHOLECYSTECTOMY  2011  . TONSILLECTOMY AND ADENOIDECTOMY  1975    Current Outpatient Medications  Medication Sig Dispense Refill  . amphetamine-dextroamphetamine (ADDERALL) 10 MG tablet Take 10 mg by mouth every morning.     Marland Kitchen amphetamine-dextroamphetamine (ADDERALL) 20 MG tablet Take 20 mg by mouth every evening.     . carvedilol (COREG) 6.25 MG tablet TAKE 1 TABLET BY MOUTH 2 TIMES DAILY. 180 tablet 3  . cyclobenzaprine (FLEXERIL) 5 MG tablet Take 5 mg by mouth at bedtime.    . furosemide (LASIX) 40 MG tablet Take 40 mg by mouth daily.    Marland Kitchen gabapentin (NEURONTIN) 400 MG capsule Take 400 mg by mouth 3 (three) times daily.    . hydrochlorothiazide (HYDRODIURIL) 25 MG tablet Take 1 tablet (25 mg total) by mouth daily. 90 tablet 3  . methocarbamol (ROBAXIN) 500 MG tablet TAKE 1 TABLET TWICE A DAY 90 tablet 7  . omeprazole (PRILOSEC) 40 MG capsule Take 1 capsule (40 mg total) by mouth daily. 90 capsule 1  . rosuvastatin (CRESTOR) 5  MG tablet Take 1 tablet (5 mg total) by mouth daily. 90 tablet 3   No current facility-administered medications for this visit.    Allergies:   Bee venom, Codeine, Hydrocodone, Tramadol, and Oxycodone   Social History:  The patient  reports that she has never smoked. She has never used smokeless tobacco. She reports current alcohol use. She reports that she  does not use drugs.   Family History:   family history includes Alzheimer's disease in her maternal grandmother and paternal grandmother; Arthritis in her mother; Breast cancer in her maternal grandmother and paternal grandmother; Cancer in her mother; Diabetes in her father; Heart disease in her maternal grandmother; Hyperlipidemia in her father; Hypertension in her father and mother; Stroke in her father.    Review of Systems: Review of Systems  Constitutional: Negative.   HENT: Negative.   Respiratory: Negative.   Cardiovascular: Negative.   Gastrointestinal: Negative.   Musculoskeletal: Negative.   Neurological: Negative.   Psychiatric/Behavioral: Negative.   All other systems reviewed and are negative.   PHYSICAL EXAM: VS:  BP 120/90 (BP Location: Left Arm, Patient Position: Sitting, Cuff Size: Normal)   Pulse 78   Ht 5\' 9"  (1.753 m)   Wt 243 lb (110.2 kg)   SpO2 99%   BMI 35.88 kg/m  , BMI Body mass index is 35.88 kg/m. Constitutional:  oriented to person, place, and time. No distress.  HENT:  Head: Grossly normal Eyes:  no discharge. No scleral icterus.  Neck: No JVD, no carotid bruits  Cardiovascular: Regular rate and rhythm, no murmurs appreciated Pulmonary/Chest: Clear to auscultation bilaterally, no wheezes or rails Abdominal: Soft.  no distension.  no tenderness.  Musculoskeletal: Normal range of motion Neurological:  normal muscle tone. Coordination normal. 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 No changes made to the medications.  Mixed hyperlipidemia We have recommended CT coronary calcium scoring, order placed We'll also start Crestor 5 mg daily  Lower extremity edema Continue on Lasix 20 daily with HCTZ, BMP stable Extra Lasix as needed for worsening leg swelling   Total encounter time more than 25 minutes  Greater than 50% was spent in counseling and coordination of care with the patient   Disposition:   F/U 12 months   Orders Placed This Encounter  Procedures  . EKG 12-Lead     Signed, 02/16/19, M.D., Ph.D. 05/30/2019  [facility] [place], [place]

## 2019-05-29 NOTE — Telephone Encounter (Signed)
Lab work reviewed potassium normal range Cholesterol running higher which we can discuss

## 2019-05-30 ENCOUNTER — Encounter: Payer: Self-pay | Admitting: Cardiovascular Disease

## 2019-05-30 ENCOUNTER — Ambulatory Visit: Payer: BC Managed Care – PPO | Admitting: Cardiovascular Disease

## 2019-05-30 ENCOUNTER — Other Ambulatory Visit: Payer: Self-pay

## 2019-05-30 VITALS — BP 120/90 | HR 78 | Ht 69.0 in | Wt 243.0 lb

## 2019-05-30 DIAGNOSIS — Z8679 Personal history of other diseases of the circulatory system: Secondary | ICD-10-CM | POA: Diagnosis not present

## 2019-05-30 DIAGNOSIS — I10 Essential (primary) hypertension: Secondary | ICD-10-CM

## 2019-05-30 DIAGNOSIS — R002 Palpitations: Secondary | ICD-10-CM

## 2019-05-30 DIAGNOSIS — I471 Supraventricular tachycardia: Secondary | ICD-10-CM

## 2019-05-30 DIAGNOSIS — E782 Mixed hyperlipidemia: Secondary | ICD-10-CM | POA: Diagnosis not present

## 2019-05-30 MED ORDER — ROSUVASTATIN CALCIUM 5 MG PO TABS
5.0000 mg | ORAL_TABLET | Freq: Every day | ORAL | 3 refills | Status: DC
Start: 2019-05-30 — End: 2020-08-20

## 2019-05-30 NOTE — Telephone Encounter (Signed)
Patient here for appointment with provider and he will review results with her today.

## 2019-05-30 NOTE — Patient Instructions (Addendum)
Medication Instructions:  Crestor 5 mg daily   If you need a refill on your cardiac medications before your next appointment, please call your pharmacy.    Lab work: No new labs needed   If you have labs (blood work) drawn today and your tests are completely normal, you will receive your results only by: Marland Kitchen MyChart Message (if you have MyChart) OR . A paper copy in the mail If you have any lab test that is abnormal or we need to change your treatment, we will call you to review the results.   Testing/Procedures: CT coronary calcium score, high cholesterol  We will order CT coronary calcium score $154.42 at our [facility] in [place]  Please call [phone] to schedule [facility]   [address] [place], [place] [address]   Follow-Up: At [facility], you and your health needs are our priority.  As part of our continuing mission to provide you with exceptional heart care, we have created designated Provider Care Teams.  These Care Teams include your primary Cardiologist (physician) and Advanced Practice Providers (APPs -  Physician Assistants and Nurse Practitioners) who all work together to provide you with the care you need, when you need it.  . You will need a follow up appointment in 12 months   . Providers on your designated Care Team:   . Nicolasa Ducking, NP . Eula Listen, PA-C . Marisue Ivan, PA-C  Any Other Special Instructions Will Be Listed Below (If Applicable).  For educational health videos Log in to : [email or web address] Or : [email or web address], password : [email or web address]

## 2019-06-02 ENCOUNTER — Encounter: Payer: BC Managed Care – PPO | Admitting: Internal Medicine

## 2019-06-03 ENCOUNTER — Ambulatory Visit: Payer: BC Managed Care – PPO | Admitting: Physical Therapy

## 2019-06-06 ENCOUNTER — Encounter: Payer: Self-pay | Admitting: Physical Therapy

## 2019-06-06 ENCOUNTER — Other Ambulatory Visit: Payer: Self-pay

## 2019-06-06 ENCOUNTER — Ambulatory Visit: Payer: BC Managed Care – PPO | Attending: Acute Care | Admitting: Physical Therapy

## 2019-06-06 DIAGNOSIS — M79601 Pain in right arm: Secondary | ICD-10-CM | POA: Diagnosis not present

## 2019-06-06 DIAGNOSIS — M2560 Stiffness of unspecified joint, not elsewhere classified: Secondary | ICD-10-CM

## 2019-06-06 DIAGNOSIS — M6281 Muscle weakness (generalized): Secondary | ICD-10-CM

## 2019-06-06 NOTE — Therapy (Signed)
[facility] [address] [place], [place], [address] Phone: [phone]   Fax:  [phone]  Physical Therapy Treatment  Patient Details  Name: Amanda Humphrey MRN: [id number] Date of Birth: 02/03/1965 Referring Provider (PT): Megan Ann Brissie, NP   Encounter Date: 06/06/2019  PT End of Session - 06/06/19 0729    Visit Number  17    Number of Visits  19    Date for PT Re-Evaluation  06/17/19    Authorization - Visit Number  3    Authorization - Number of Visits  10    PT Start Time  0729    PT Stop Time  0824    PT Time Calculation (min)  55 min    Activity Tolerance  Patient tolerated treatment well;Patient limited by pain    Behavior During Therapy  WFL for tasks assessed/performed       Past Medical History:  Diagnosis Date  . Allergy   . Chronic kidney disease    stones  . Fibrocystic breast disease 2013  . Meniere disease   . Migraines     Past Surgical History:  Procedure Laterality Date  . ABDOMINAL HYSTERECTOMY  2011   laprascopic supracervical, [place]  . APPENDECTOMY  1999  . BREAST BIOPSY Right Nov 2012    fibrocystic disease, [place]  . CESAREAN SECTION     4 total  . CHOLECYSTECTOMY  2011  . TONSILLECTOMY AND ADENOIDECTOMY  1975    There were no vitals filed for this visit.  Subjective Assessment - 06/06/19 0729    Subjective  Pt. did a lot of lifting/ pushing to assist with daughter moving out of college dorm.  Pt. reports increase nerve pain since Saturday (7/10 B UT/ neck region).    Limitations  Lifting;House hold activities    Patient Stated Goals  Decrease muscle spasms, return to work    Currently in Pain?  Yes    Pain Score  7     Pain Location  Neck    Pain Orientation  Right;Left    Pain Descriptors / Indicators  Aching    Pain Onset  More than a month ago    Pain Onset  More than a month ago        Manual Therapy:  Seated/supine STM to bilateralUT/ cervical and thoracic  paraspinals.  Moderate tenderness with palpation over R mid-low thoracic and L upper cervical region.   Supine L/RUT/ levatorstretches 3x each. SeatedHypervolt useto B rhomboid/ UT to low thoracic musculature (decrease symptoms).  There.Ex:  Supine B shoulder AROM (flexion/ horizontal abduction)- 20x. Supine 2# UE sh. Flexion (alt.)/ abduction 20x each. Supine 3# serratus punches L/R 10x2.  Each.    Reviewed resistive HEP     PT Long Term Goals - 05/21/19 0806      PT LONG TERM GOAL #1   Title  Pt. will improve FOTO score to >55 to improve independence with ADLs    Baseline  Score 34/Goal 55.  3/25: 46 (improvement noted.    Time  4    Period  Weeks    Status  Partially Met    Target Date  06/17/19      PT LONG TERM GOAL #2   Title  Pt. will be able to improve R cervical rotation to >70 degs to be able to be able to improve ROM for driving    Baseline  R 55 deg. L 70 deg.  3/25: cervical AROM   WFL (all planes)- R rotn. 70 deg. (slight c/o tightness/ discomfort).    Time  4    Period  Weeks    Status  Achieved      PT LONG TERM GOAL #3   Title  Pt. will be able to maintain upright posture with scapular retraction for >20 minutes to improve seated posture for work.    Baseline  Pt. presents with forward head and rounded shoulders and increased thoracic kyphosis.    Time  4    Period  Weeks    Status  Partially Met    Target Date  06/17/19      PT LONG TERM GOAL #4   Title  Pt. will improve R grip strength by 30% to improve ability to grasp coffee mug    Baseline  R 39#  L 82#    Time  4    Period  Weeks    Status  Partially Met    Target Date  06/17/19      PT LONG TERM GOAL #5   Title  Pt. will be independent with [employer] ex. program to progress UE/posture strengthening with no increase c/o pain.    Baseline  Pt. has attended [employer] for past couple weeks.    Time  4    Period  Weeks    Status  New    Target Date  06/17/19             Plan - 06/06/19 0730    Clinical Impression Statement  Pt. presents with marked increase in neck/ mid-back pain after assisting daughter with moving out of dorm this past weekend.  Pt. has increase R UE nerve involvement and palpable pain, esp. in R mid-low thoracic paraspinals.  Pt. reports decrease in symptoms after manual stretching/ use of Hypervolt in seated posture.  No change to HEP and pt. instructed to return to gym based ex. 2x/ week.    Personal Factors and Comorbidities  Comorbidity 2;Profession;Past/Current Experience    Comorbidities  Hypertension, Obesity    Examination-Activity Limitations  Sleep;Reach Overhead;Lift;Bed Mobility    Stability/Clinical Decision Making  Evolving/Moderate complexity    Clinical Decision Making  Moderate    Rehab Potential  Good    PT Frequency  2x / week    PT Duration  4 weeks    PT Treatment/Interventions  ADLs/Self Care Home Management;Aquatic Therapy;Biofeedback;Cryotherapy;Electrical Stimulation;Moist Heat;Ultrasound;Functional mobility training;Therapeutic activities;Therapeutic exercise;Balance training;Neuromuscular re-education;Patient/family education;Manual techniques;Scar mobilization;Passive range of motion;Dry needling;Energy conservation;Taping    PT Next Visit Plan  Posture/ strengthening ex. program.  Discuss week at [place].    PT Home Exercise Plan  [id number]       Patient will benefit from skilled therapeutic intervention in order to improve the following deficits and impairments:  Decreased mobility, Decreased endurance, Hypomobility, Increased muscle spasms, Impaired sensation, Decreased range of motion, Decreased scar mobility, Impaired tone, Improper body mechanics, Postural dysfunction, Impaired flexibility, Decreased strength, Decreased activity tolerance  Visit Diagnosis: Muscle weakness (generalized)  Joint stiffness of spine  Pain in right arm     Problem List Patient Active Problem List    Diagnosis Date Noted  . H/O of hemilaminectomy 02/15/2019  . 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 Fatigue 09/23/2017  . History of pneumonia 04/30/2016  . Grief reaction 04/30/2016  . Vitamin D deficiency 02/22/2015  . Hyperlipidemia 01/01/2015  . Visit for preventive health examination 12/04/2013  . Lower extremity edema 09/02/2013  . Essential hypertension 09/02/2013  .   History of cardiomyopathy 09/02/2013  . SVT (supraventricular tachycardia) (HCC) 09/02/2013  . Palpitations 09/02/2013  . Contrast media allergy 03/10/2013  . Fibrocystic breast disease   . Obesity (BMI 30-39.9) 02/05/2012  . Meniere disease    Michael C Sherk, PT, DPT # [id number] 06/06/2019, 10:49 AM  [facility] [address] [place], [place], [address] Phone: [phone]   Fax:  [phone]  Name: Tyja S Gitlin MRN: [id number] Date of Birth: 09/17/1965   

## 2019-06-10 ENCOUNTER — Ambulatory Visit: Payer: BC Managed Care – PPO | Admitting: Physical Therapy

## 2019-06-15 ENCOUNTER — Ambulatory Visit: Payer: BC Managed Care – PPO | Admitting: Physical Therapy

## 2019-06-15 ENCOUNTER — Other Ambulatory Visit: Payer: Self-pay

## 2019-06-15 ENCOUNTER — Encounter: Payer: Self-pay | Admitting: Physical Therapy

## 2019-06-15 DIAGNOSIS — M79601 Pain in right arm: Secondary | ICD-10-CM

## 2019-06-15 DIAGNOSIS — M6281 Muscle weakness (generalized): Secondary | ICD-10-CM | POA: Diagnosis not present

## 2019-06-15 DIAGNOSIS — M2560 Stiffness of unspecified joint, not elsewhere classified: Secondary | ICD-10-CM | POA: Diagnosis not present

## 2019-06-15 NOTE — Therapy (Signed)
[facility] [address] [place], [place], [address] Phone: [phone]   Fax:  [phone]  Physical Therapy Treatment  Patient Details  Name: Amanda Humphrey MRN: [id number] Date of Birth: July 26, 1965 Referring Provider (PT): Remo Lipps, NP   Encounter Date: 06/15/2019  PT End of Session - 06/15/19 0727    Visit Number  18    Number of Visits  19    Date for PT Re-Evaluation  06/17/19    Authorization - Visit Number  4    Authorization - Number of Visits  10    PT Start Time  0734    PT Stop Time  0825    PT Time Calculation (min)  51 min    Activity Tolerance  Patient tolerated treatment well;Patient limited by pain    Behavior During Therapy  [facility] for tasks assessed/performed       Past Medical History:  Diagnosis Date  . Allergy   . Chronic kidney disease    stones  . Fibrocystic breast disease 2013  . Meniere disease   . Migraines     Past Surgical History:  Procedure Laterality Date  . ABDOMINAL HYSTERECTOMY  2011   laprascopic supracervical, [place]  . APPENDECTOMY  1999  . BREAST BIOPSY Right Nov 2012    fibrocystic disease, [place]  . CESAREAN SECTION     4 total  . CHOLECYSTECTOMY  2011  . TONSILLECTOMY AND ADENOIDECTOMY  1975    There were no vitals filed for this visit.  Subjective Assessment - 06/15/19 0726    Subjective  Pt. states she was busy last week.  Pt. reports 5/10 neck/ upper back discomfort.  Pt. reports "catching" under shoulder blade.  Pt. ordered new desk for work.  Pt. scheduled to have a vacation at beach next week.    Limitations  Lifting;House hold activities    Patient Stated Goals  Decrease muscle spasms, return to work    Currently in Pain?  Yes    Pain Score  5     Pain Location  Neck    Pain Orientation  Right;Left    Pain Descriptors / Indicators  Aching    Pain Onset  More than a month ago    Pain Onset  More than a month ago         Manual  Therapy:  Seated/supine STM to bilateralUT/ cervical and thoracic paraspinals.  Moderate tenderness with palpation over R mid-low thoracic and L upper cervical region.  Several trigger points noted on R. Supine L/RUT/ rotationstretches 3x each. SeatedHypervolt useto B rhomboid/ UT to low thoracic musculature (decrease symptoms).  There.Ex:  Standing Nautilus: 40# lat. Pull downs/ 30# tricep ext./ 30# sh. Extension/ 40# scap. Retraction 30x each.  Supine B shoulder AROM (flexion/ horizontal abduction)- 20x. No resisted wts. In supine. Standing overhead reaching/ sh. Abduction 10x each (good scapular mobility)  Reviewed resistive HEP/ pt. Will complete aquatic ex. Next week at beach.     PT Long Term Goals - 05/21/19 0806      PT LONG TERM GOAL #1   Title  Pt. will improve FOTO score to >55 to improve independence with ADLs    Baseline  Score 34/Goal 55.  3/25: 46 (improvement noted.    Time  4    Period  Weeks    Status  Partially Met    Target Date  06/17/19      PT LONG TERM GOAL #2  Title  Pt. will be able to improve R cervical rotation to >70 degs to be able to be able to improve ROM for driving    Baseline  R 55 deg. L 70 deg.  3/25: cervical AROM WFL (all planes)- R rotn. 70 deg. (slight c/o tightness/ discomfort).    Time  4    Period  Weeks    Status  Achieved      PT LONG TERM GOAL #3   Title  Pt. will be able to maintain upright posture with scapular retraction for >20 minutes to improve seated posture for work.    Baseline  Pt. presents with forward head and rounded shoulders and increased thoracic kyphosis.    Time  4    Period  Weeks    Status  Partially Met    Target Date  06/17/19      PT LONG TERM GOAL #4   Title  Pt. will improve R grip strength by 30% to improve ability to grasp coffee mug    Baseline  R 39#  L 82#    Time  4    Period  Weeks    Status  Partially Met    Target Date  06/17/19      PT LONG TERM GOAL #5   Title  Pt. will be  independent with [employer] ex. program to progress UE/posture strengthening with no increase c/o pain.    Baseline  Pt. has attended [employer] for past couple weeks.    Time  4    Period  Weeks    Status  New    Target Date  06/17/19            Plan - 06/15/19 0727    Clinical Impression Statement  Pt. did really well today with UE strengthening and proper technique/ UT muscle relaxation.  R mid-thoracic/rhomboid/subscapularis discomfort with palpation and overhead reaching/lifting activities.  Pt. will be recerted for a few more weeks to focus on strengtheing/ max physical moblity prior to MD f/u June 16th.    Personal Factors and Comorbidities  Comorbidity 2;Profession;Past/Current Experience    Comorbidities  Hypertension, Obesity    Examination-Activity Limitations  Sleep;Reach Overhead;Lift;Bed Mobility    Stability/Clinical Decision Making  Evolving/Moderate complexity    Clinical Decision Making  Moderate    Rehab Potential  Good    PT Frequency  2x / week    PT Duration  4 weeks    PT Treatment/Interventions  ADLs/Self Care Home Management;Aquatic Therapy;Biofeedback;Cryotherapy;Electrical Stimulation;Moist Heat;Ultrasound;Functional mobility training;Therapeutic activities;Therapeutic exercise;Balance training;Neuromuscular re-education;Patient/family education;Manual techniques;Scar mobilization;Passive range of motion;Dry needling;Energy conservation;Taping    PT Next Visit Plan  Posture/ strengthening ex. program.  RECERT NEXT [place] SESSION/ discuss beach.    PT Home Exercise Plan  [id number]       Patient will benefit from skilled therapeutic intervention in order to improve the following deficits and impairments:  Decreased mobility, Decreased endurance, Hypomobility, Increased muscle spasms, Impaired sensation, Decreased range of motion, Decreased scar mobility, Impaired tone, Improper body mechanics, Postural dysfunction, Impaired flexibility, Decreased strength,  Decreased activity tolerance  Visit Diagnosis: Muscle weakness (generalized)  Joint stiffness of spine  Pain in right arm     Problem List Patient Active Problem List   Diagnosis Date Noted  . H/O of hemilaminectomy 02/15/2019  . 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[place]) 11/17/2018  . Fatigue 09/23/2017  . History of pneumonia 04/30/2016  . Grief reaction 04/30/2016  . Vitamin D deficiency 02/22/2015  . Hyperlipidemia 01/01/2015  .  Visit for preventive health examination 12/04/2013  . Lower extremity edema 09/02/2013  . Essential hypertension 09/02/2013  . History of cardiomyopathy 09/02/2013  . SVT (supraventricular tachycardia) ([place]) 09/02/2013  . Palpitations 09/02/2013  . Contrast media allergy 03/10/2013  . Fibrocystic breast disease   . Obesity (BMI 30-39.9) 02/05/2012  . Meniere disease    Pura Spice, PT, DPT # [id number] 06/15/2019, 12:26 PM  [facility] [address] [place], [place], [address] Phone: [phone]   Fax:  [phone]  Name: Amanda Humphrey MRN: [id number] Date of Birth: Dec 21, 1965

## 2019-06-29 ENCOUNTER — Other Ambulatory Visit: Payer: Self-pay

## 2019-06-29 ENCOUNTER — Ambulatory Visit: Payer: BC Managed Care – PPO | Attending: Acute Care | Admitting: Physical Therapy

## 2019-06-29 DIAGNOSIS — M6281 Muscle weakness (generalized): Secondary | ICD-10-CM | POA: Diagnosis not present

## 2019-06-29 DIAGNOSIS — M79601 Pain in right arm: Secondary | ICD-10-CM | POA: Diagnosis not present

## 2019-06-29 DIAGNOSIS — M2560 Stiffness of unspecified joint, not elsewhere classified: Secondary | ICD-10-CM | POA: Diagnosis not present

## 2019-06-30 ENCOUNTER — Encounter: Payer: Self-pay | Admitting: Physical Therapy

## 2019-07-01 NOTE — Therapy (Addendum)
[facility] [address]. [place], [place], [address] Phone: [phone]   Fax:  [phone]  Physical Therapy Treatment  Patient Details  Name: Amanda Humphrey MRN: [id number] Date of Birth: Aug 10, 1965 Referring Provider (PT): Remo Lipps, NP   Encounter Date: 06/29/2019   PT End of Session - 07/01/19 0823    Visit Number  19    Number of Visits  27    Date for PT Re-Evaluation  07/27/19    Authorization - Visit Number  1    Authorization - Number of Visits  10    PT Start Time  0906    PT Stop Time  0945    PT Time Calculation (min)  39 min    Activity Tolerance  Patient tolerated treatment well;Patient limited by pain    Behavior During Therapy  [facility] for tasks assessed/performed          Past Medical History:  Diagnosis Date  . Allergy   . Chronic kidney disease    stones  . Fibrocystic breast disease 2013  . Meniere disease   . Migraines     Past Surgical History:  Procedure Laterality Date  . ABDOMINAL HYSTERECTOMY  2011   laprascopic supracervical, [place]  . APPENDECTOMY  1999  . BREAST BIOPSY Right Nov 2012    fibrocystic disease, Pat [place]  . CESAREAN SECTION     4 total  . CHOLECYSTECTOMY  2011  . TONSILLECTOMY AND ADENOIDECTOMY  1975    There were no vitals filed for this visit.      Pt. states she had a good trip to the beach and completed a water aerobics ex. program. Pt. reports no new symptoms since last PT tx. session. Pt. planning to return to gym based ther.ex. program at [employer].      There.ex.:  Discussed beach trip/ water aerobics ex.  Seated/standing Nautilus:  B sh. Adduction 30#/ lat. Pull downs 40#/ tricep ext. 30#/ unilateral ER 20#/ scapular retraction 30# 20x each.    Reassessment of cervical/ B shoulder AROM (all planes) at mirror.  Reviewed gym based there.ex.        PT Long Term Goals - 07/05/19 1319      PT LONG TERM GOAL #1   Title Pt. will improve  FOTO score to >55 to improve independence with ADLs    Baseline Score 34/Goal 55.  3/25: 46 (improvement noted.    Time 4    Period Weeks    Status Partially Met    Target Date 07/27/19      PT LONG TERM GOAL #2   Title Pt. will be able to improve R cervical rotation to >70 degs to be able to be able to improve ROM for driving    Baseline R 55 deg. L 70 deg.  3/25: cervical AROM WFL (all planes)- R rotn. 70 deg. (slight c/o tightness/ discomfort).    Time 4    Period Weeks    Status Achieved    Target Date 05/19/19      PT LONG TERM GOAL #3   Title Pt. will be able to maintain upright posture with scapular retraction for >20 minutes to improve seated posture for work.    Baseline Pt. presents with forward head and rounded shoulders and increased thoracic kyphosis.  6/2: standing desk at work    Time 4    Period Weeks    Status Partially Met    Target Date 07/27/19  PT LONG TERM GOAL #4   Title Pt. will improve R grip strength by 30% to improve ability to grasp coffee mug    Baseline R 39#  L 82#    Time 4    Period Weeks    Status Partially Met    Target Date 07/27/19      PT LONG TERM GOAL #5   Title Pt. will be independent with [employer] ex. program to progress UE/posture strengthening with no increase c/o pain.    Baseline Pt. has attended [employer] for past couple weeks.    Time 4    Period Weeks    Status Partially Met    Target Date 07/27/19            PT focus on ther.ex. completion of Nautilus and posture correction. Good cervical AROM WFL all planes. (+) low cervical tenderness with palpation and PT focused on mid to upper thoracic paraspinal STM. No change to HEP and PT instructed pt. to return to gym based ex. Pt. will continue to benefit from skilled PT services 1-2x/week to increaes upright posture/ strengthening to improve pain-free mobility.       Patient will benefit from skilled therapeutic intervention in order to improve the following  deficits and impairments:  Decreased mobility, Decreased endurance, Hypomobility, Increased muscle spasms, Impaired sensation, Decreased range of motion, Decreased scar mobility, Impaired tone, Improper body mechanics, Postural dysfunction, Impaired flexibility, Decreased strength, Decreased activity tolerance  Visit Diagnosis: Muscle weakness (generalized)  Joint stiffness of spine  Pain in right arm     Problem List Patient Active Problem List   Diagnosis Date Noted  . H/O of hemilaminectomy 02/15/2019  . 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[place]) 11/17/2018  . Fatigue 09/23/2017  . History of pneumonia 04/30/2016  . Grief reaction 04/30/2016  . Vitamin D deficiency 02/22/2015  . Hyperlipidemia 01/01/2015  . Visit for preventive health examination 12/04/2013  . Lower extremity edema 09/02/2013  . Essential hypertension 09/02/2013  . History of cardiomyopathy 09/02/2013  . SVT (supraventricular tachycardia) ([place]) 09/02/2013  . Palpitations 09/02/2013  . Contrast media allergy 03/10/2013  . Fibrocystic breast disease   . Morbid obesity ([place]) 02/05/2012  . Meniere disease    Pura Spice, PT, DPT # [phone] 07/06/2019, 1:22 PM  [place] [facility] [address] [place], [place], [address] Phone: [phone]   Fax:  [phone]  Name: Amanda Humphrey MRN: [id number] Date of Birth: [birth year]

## 2019-07-05 ENCOUNTER — Ambulatory Visit: Payer: BC Managed Care – PPO | Admitting: Physical Therapy

## 2019-07-05 ENCOUNTER — Other Ambulatory Visit: Payer: Self-pay

## 2019-07-05 DIAGNOSIS — M79601 Pain in right arm: Secondary | ICD-10-CM

## 2019-07-05 DIAGNOSIS — M2560 Stiffness of unspecified joint, not elsewhere classified: Secondary | ICD-10-CM | POA: Diagnosis not present

## 2019-07-05 DIAGNOSIS — M6281 Muscle weakness (generalized): Secondary | ICD-10-CM | POA: Diagnosis not present

## 2019-07-07 ENCOUNTER — Ambulatory Visit: Payer: BC Managed Care – PPO | Admitting: Physical Therapy

## 2019-07-08 NOTE — Therapy (Addendum)
[facility] [address] [place], [place], [address] Phone: [phone]   Fax:  [phone]  Physical Therapy Treatment  Patient Details  Name: Amanda Humphrey MRN: [id number] Date of Birth: 1965/03/06 Referring Provider (PT): Remo Lipps, NP   Encounter Date: 07/05/2019     PT End of Session - 07/08/19 0646    Visit Number 20    Number of Visits 27    Date for PT Re-Evaluation 07/27/19    Authorization - Visit Number 2    Authorization - Number of Visits 10    PT Start Time [phone]    PT Stop Time 1032    PT Time Calculation (min) 45 min    Activity Tolerance Patient tolerated treatment well;Patient limited by pain    Behavior During Therapy [facility] for tasks assessed/performed             Past Medical History:  Diagnosis Date  . Allergy   . Chronic kidney disease    stones  . Fibrocystic breast disease 2013  . Meniere disease   . Migraines     Past Surgical History:  Procedure Laterality Date  . ABDOMINAL HYSTERECTOMY  2011   laprascopic supracervical, DeFrancesco  . APPENDECTOMY  1999  . BREAST BIOPSY Right Nov 2012    fibrocystic disease, Pat Patrick  . CESAREAN SECTION     4 total  . CHOLECYSTECTOMY  2011  . TONSILLECTOMY AND ADENOIDECTOMY  1975    There were no vitals filed for this visit.     Pt. reports limited compliance with gym based ex. due to being busy at work. Pt. entered PT with similar neck pain as previous tx. session.     There.ex.:  Seated/standing Nautilus:  B sh. Adduction 30#/ lat. Pull downs 50#/ tricep ext. 30#/ unilateral ER 20# (20x each).   Scapular retraction 40# 15x2 each.    Standing 1# D1/D2 ex. On L/R with mirror feedback.  3# standing bicep curls 20x.  Manual tx.:  Supine/ prone STM to upper thoracic/ cervical musculature Prone STM to mid-thoracic/cervical region (massage with free-up).       PT Long Term Goals - 07/05/19 1319      PT LONG TERM GOAL #1   Title  Pt. will improve FOTO score to >55 to improve independence with ADLs    Baseline Score 34/Goal 55.  3/25: 46 (improvement noted.    Time 4    Period Weeks    Status Partially Met    Target Date 07/27/19      PT LONG TERM GOAL #2   Title Pt. will be able to improve R cervical rotation to >70 degs to be able to be able to improve ROM for driving    Baseline R 55 deg. L 70 deg.  3/25: cervical AROM WFL (all planes)- R rotn. 70 deg. (slight c/o tightness/ discomfort).    Time 4    Period Weeks    Status Achieved    Target Date 05/19/19      PT LONG TERM GOAL #3   Title Pt. will be able to maintain upright posture with scapular retraction for >20 minutes to improve seated posture for work.    Baseline Pt. presents with forward head and rounded shoulders and increased thoracic kyphosis.  6/2: standing desk at work    Time 4    Period Weeks    Status Partially Met    Target Date 07/27/19  PT LONG TERM GOAL #4   Title Pt. will improve R grip strength by 30% to improve ability to grasp coffee mug    Baseline R 39#  L 82#    Time 4    Period Weeks    Status Partially Met    Target Date 07/27/19      PT LONG TERM GOAL #5   Title Pt. will be independent with [employer] ex. program to progress UE/posture strengthening with no increase c/o pain.    Baseline Pt. has attended [employer] for past couple weeks.    Time 4    Period Weeks    Status Partially Met    Target Date 07/27/19            Pt. presents with generalized B UT (L>R) and lower cervical soft tissue tenderness during prone STM.  Pt. continues to show slow but consistent progression in UE/ posture strengthening ex. at [employer].  Slight increase in wts. with lat. pull down/ scapular retraction.  Good reassessment of standing sh. D1/D2 ex. with 1# wt. while facing mirror.  Pt. continues to benefit from use of MH in morning and when pain worsens.       Patient will benefit from skilled therapeutic intervention  in order to improve the following deficits and impairments:  Decreased mobility, Decreased endurance, Hypomobility, Increased muscle spasms, Impaired sensation, Decreased range of motion, Decreased scar mobility, Impaired tone, Improper body mechanics, Postural dysfunction, Impaired flexibility, Decreased strength, Decreased activity tolerance  Visit Diagnosis: Muscle weakness (generalized)  Joint stiffness of spine  Pain in right arm     Problem List Patient Active Problem List   Diagnosis Date Noted  . H/O of hemilaminectomy 02/15/2019  . 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[place]) 11/17/2018  . Fatigue 09/23/2017  . History of pneumonia 04/30/2016  . Grief reaction 04/30/2016  . Vitamin D deficiency 02/22/2015  . Hyperlipidemia 01/01/2015  . Visit for preventive health examination 12/04/2013  . Lower extremity edema 09/02/2013  . Essential hypertension 09/02/2013  . History of cardiomyopathy 09/02/2013  . SVT (supraventricular tachycardia) ([place]) 09/02/2013  . Palpitations 09/02/2013  . Contrast media allergy 03/10/2013  . Fibrocystic breast disease   . Morbid obesity ([place]) 02/05/2012  . Meniere disease    Pura Spice, PT, DPT # [phone] 07/08/2019, 2:11 PM  [place] [facility] [address] [place], [place], [address] Phone: [phone]   Fax:  [phone]  Name: KENDEL PESNELL MRN: [id number] Date of Birth: 1965-11-05

## 2019-07-12 ENCOUNTER — Other Ambulatory Visit: Payer: Self-pay

## 2019-07-12 ENCOUNTER — Ambulatory Visit: Payer: BC Managed Care – PPO | Admitting: Physical Therapy

## 2019-07-12 DIAGNOSIS — M2560 Stiffness of unspecified joint, not elsewhere classified: Secondary | ICD-10-CM

## 2019-07-12 DIAGNOSIS — M6281 Muscle weakness (generalized): Secondary | ICD-10-CM

## 2019-07-12 DIAGNOSIS — Z9889 Other specified postprocedural states: Secondary | ICD-10-CM | POA: Diagnosis not present

## 2019-07-12 DIAGNOSIS — M47812 Spondylosis without myelopathy or radiculopathy, cervical region: Secondary | ICD-10-CM | POA: Diagnosis not present

## 2019-07-12 DIAGNOSIS — M79601 Pain in right arm: Secondary | ICD-10-CM

## 2019-07-12 DIAGNOSIS — G8929 Other chronic pain: Secondary | ICD-10-CM | POA: Diagnosis not present

## 2019-07-12 DIAGNOSIS — M25511 Pain in right shoulder: Secondary | ICD-10-CM | POA: Diagnosis not present

## 2019-07-12 DIAGNOSIS — M19011 Primary osteoarthritis, right shoulder: Secondary | ICD-10-CM | POA: Diagnosis not present

## 2019-07-12 DIAGNOSIS — M5030 Other cervical disc degeneration, unspecified cervical region: Secondary | ICD-10-CM | POA: Diagnosis not present

## 2019-07-13 DIAGNOSIS — Z9889 Other specified postprocedural states: Secondary | ICD-10-CM | POA: Diagnosis not present

## 2019-07-14 ENCOUNTER — Encounter: Payer: Self-pay | Admitting: Physical Therapy

## 2019-07-14 DIAGNOSIS — Z9889 Other specified postprocedural states: Secondary | ICD-10-CM | POA: Diagnosis not present

## 2019-07-14 DIAGNOSIS — M50323 Other cervical disc degeneration at C6-C7 level: Secondary | ICD-10-CM | POA: Diagnosis not present

## 2019-07-14 NOTE — Therapy (Addendum)
[facility] [address] [place], [place], [address] Phone: [phone]   Fax:  [phone]  Physical Therapy Treatment  Patient Details  Name: Amanda Humphrey MRN: [id number] Date of Birth: September 29, 1965 Referring Provider (PT): Remo Lipps, NP   Encounter Date: 07/12/2019     PT End of Session - 07/14/19 1459    Visit Number 21    Number of Visits 27    Date for PT Re-Evaluation 07/27/19    Authorization - Visit Number 3    Authorization - Number of Visits 10    PT Start Time 1532    PT Stop Time 8466    PT Time Calculation (min) 41 min    Activity Tolerance Patient tolerated treatment well;Patient limited by pain    Behavior During Therapy [facility] for tasks assessed/performed             Past Medical History:  Diagnosis Date  . Allergy   . Chronic kidney disease    stones  . Fibrocystic breast disease 2013  . Meniere disease   . Migraines     Past Surgical History:  Procedure Laterality Date  . ABDOMINAL HYSTERECTOMY  2011   laprascopic supracervical, DeFrancesco  . APPENDECTOMY  1999  . BREAST BIOPSY Right Nov 2012    fibrocystic disease, Pat Patrick  . CESAREAN SECTION     4 total  . CHOLECYSTECTOMY  2011  . TONSILLECTOMY AND ADENOIDECTOMY  1975    There were no vitals filed for this visit.    Pt. reports no new complaints. Pt. states she has been doing HEP/ stretches. Pain is present with increase activity/ prolonged work-related computer tasks.       There.ex.:  Supine 3# bicep curls/ horizontal abduction/ adduction/ flexion 20x each. Seated/standing Nautilus: B sh. Adduction 30#/ lat. Pull downs 50#/ tricep ext. 30#/ unilateral ER 20# (20x each).   Scapular retraction 40# 15x2 each.   Supine chin tucks with manual feedback/ seated chin tucks   Manual tx.:  Supine/ prone STM to upper thoracic/ cervical musculature Prone STM to mid-thoracic/cervical region (massage with free-up).         PT Long Term Goals - 07/05/19 1319      PT LONG TERM GOAL #1   Title Pt. will improve FOTO score to >55 to improve independence with ADLs    Baseline Score 34/Goal 55.  3/25: 46 (improvement noted.    Time 4    Period Weeks    Status Partially Met    Target Date 07/27/19      PT LONG TERM GOAL #2   Title Pt. will be able to improve R cervical rotation to >70 degs to be able to be able to improve ROM for driving    Baseline R 55 deg. L 70 deg.  3/25: cervical AROM WFL (all planes)- R rotn. 70 deg. (slight c/o tightness/ discomfort).    Time 4    Period Weeks    Status Achieved    Target Date 05/19/19      PT LONG TERM GOAL #3   Title Pt. will be able to maintain upright posture with scapular retraction for >20 minutes to improve seated posture for work.    Baseline Pt. presents with forward head and rounded shoulders and increased thoracic kyphosis.  6/2: standing desk at work    Time 4    Period Weeks    Status Partially Met    Target Date 07/27/19  PT LONG TERM GOAL #4   Title Pt. will improve R grip strength by 30% to improve ability to grasp coffee mug    Baseline R 39#  L 82#    Time 4    Period Weeks    Status Partially Met    Target Date 07/27/19      PT LONG TERM GOAL #5   Title Pt. will be independent with [employer] ex. program to progress UE/posture strengthening with no increase c/o pain.    Baseline Pt. has attended [employer] for past couple weeks.    Time 4    Period Weeks    Status Partially Met    Target Date 07/27/19             Pt. demonstrates improved chin tucks in supine/ seated posture but upper cervical discomfort noted, esp. in supine position. Pt. completes 3# UE strengthening ex. in supine position with proper technique with cuing to relax cervical/ upper trap musculature. Pt. encouraged to continue with a consistent gym based ther.ex. program to improve strengthening/ pain-free mobility.      Patient will benefit  from skilled therapeutic intervention in order to improve the following deficits and impairments:  Decreased mobility, Decreased endurance, Hypomobility, Increased muscle spasms, Impaired sensation, Decreased range of motion, Decreased scar mobility, Impaired tone, Improper body mechanics, Postural dysfunction, Impaired flexibility, Decreased strength, Decreased activity tolerance  Visit Diagnosis: Muscle weakness (generalized)  Joint stiffness of spine  Pain in right arm     Problem List Patient Active Problem List   Diagnosis Date Noted  . H/O of hemilaminectomy 02/15/2019  . 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[place]) 11/17/2018  . Fatigue 09/23/2017  . History of pneumonia 04/30/2016  . Grief reaction 04/30/2016  . Vitamin D deficiency 02/22/2015  . Hyperlipidemia 01/01/2015  . Visit for preventive health examination 12/04/2013  . Lower extremity edema 09/02/2013  . Essential hypertension 09/02/2013  . History of cardiomyopathy 09/02/2013  . SVT (supraventricular tachycardia) ([place]) 09/02/2013  . Palpitations 09/02/2013  . Contrast media allergy 03/10/2013  . Fibrocystic breast disease   . Morbid obesity ([place]) 02/05/2012  . Meniere disease    Pura Spice, PT, DPT # [phone] 07/24/2019, 9:19 AM  [place] [facility] [address] [place], [place], [address] Phone: [phone]   Fax:  [phone]  Name: MAGUADALUPE LATA MRN: [id number] Date of Birth: 14-Feb-1965

## 2019-07-19 ENCOUNTER — Other Ambulatory Visit: Payer: Self-pay

## 2019-07-19 ENCOUNTER — Ambulatory Visit: Payer: BC Managed Care – PPO | Admitting: Physical Therapy

## 2019-07-19 DIAGNOSIS — M79601 Pain in right arm: Secondary | ICD-10-CM

## 2019-07-19 DIAGNOSIS — M2560 Stiffness of unspecified joint, not elsewhere classified: Secondary | ICD-10-CM

## 2019-07-19 DIAGNOSIS — M6281 Muscle weakness (generalized): Secondary | ICD-10-CM

## 2019-07-21 ENCOUNTER — Other Ambulatory Visit: Payer: Self-pay

## 2019-07-21 ENCOUNTER — Encounter: Payer: Self-pay | Admitting: Internal Medicine

## 2019-07-21 ENCOUNTER — Ambulatory Visit (INDEPENDENT_AMBULATORY_CARE_PROVIDER_SITE_OTHER): Payer: BC Managed Care – PPO | Admitting: Internal Medicine

## 2019-07-21 VITALS — BP 118/76 | HR 74 | Temp 97.6°F | Resp 16 | Ht 69.0 in | Wt 241.2 lb

## 2019-07-21 DIAGNOSIS — Z0000 Encounter for general adult medical examination without abnormal findings: Secondary | ICD-10-CM

## 2019-07-21 DIAGNOSIS — Z1211 Encounter for screening for malignant neoplasm of colon: Secondary | ICD-10-CM

## 2019-07-21 DIAGNOSIS — M5412 Radiculopathy, cervical region: Secondary | ICD-10-CM

## 2019-07-21 DIAGNOSIS — E782 Mixed hyperlipidemia: Secondary | ICD-10-CM

## 2019-07-21 DIAGNOSIS — I10 Essential (primary) hypertension: Secondary | ICD-10-CM

## 2019-07-21 DIAGNOSIS — M4692 Unspecified inflammatory spondylopathy, cervical region: Secondary | ICD-10-CM

## 2019-07-21 NOTE — Patient Instructions (Addendum)
Check on last tetanus vaccination  (due by our records)   Mammogram is OVERDUE!!  You need 1200 mg calcium and a minimum of 2000 iUs of D3 daily     The "Optavia Diet"   Works!  My patients are lowering their A1cs and cholesterol with it.    Health Maintenance for Postmenopausal Women Menopause is a normal process in which your ability to get pregnant comes to an end. This process happens slowly over many months or years, usually between the ages of 49 and 61. Menopause is complete when you have missed your menstrual periods for 12 months. It is important to talk with your health care provider about some of the most common conditions that affect women after menopause (postmenopausal women). These include heart disease, cancer, and bone loss (osteoporosis). Adopting a healthy lifestyle and getting preventive care can help to promote your health and wellness. The actions you take can also lower your chances of developing some of these common conditions. What should I know about menopause? During menopause, you may get a number of symptoms, such as:  Hot flashes. These can be moderate or severe.  Night sweats.  Decrease in sex drive.  Mood swings.  Headaches.  Tiredness.  Irritability.  Memory problems.  Insomnia. Choosing to treat or not to treat these symptoms is a decision that you make with your health care provider. Do I need hormone replacement therapy?  Hormone replacement therapy is effective in treating symptoms that are caused by menopause, such as hot flashes and night sweats.  Hormone replacement carries certain risks, especially as you become older. If you are thinking about using estrogen or estrogen with progestin, discuss the benefits and risks with your health care provider. What is my risk for heart disease and stroke? The risk of heart disease, heart attack, and stroke increases as you age. One of the causes may be a change in the body's hormones during  menopause. This can affect how your body uses dietary fats, triglycerides, and cholesterol. Heart attack and stroke are medical emergencies. There are many things that you can do to help prevent heart disease and stroke. Watch your blood pressure  High blood pressure causes heart disease and increases the risk of stroke. This is more likely to develop in people who have high blood pressure readings, are of African descent, or are overweight.  Have your blood pressure checked: ? Every 3-5 years if you are 69-62 years of age. ? Every year if you are 64 years old or older. Eat a healthy diet   Eat a diet that includes plenty of vegetables, fruits, low-fat dairy products, and lean protein.  Do not eat a lot of foods that are high in solid fats, added sugars, or sodium. Get regular exercise Get regular exercise. This is one of the most important things you can do for your health. Most adults should:  Try to exercise for at least 150 minutes each week. The exercise should increase your heart rate and make you sweat (moderate-intensity exercise).  Try to do strengthening exercises at least twice each week. Do these in addition to the moderate-intensity exercise.  Spend less time sitting. Even light physical activity can be beneficial. Other tips  Work with your health care provider to achieve or maintain a healthy weight.  Do not use any products that contain nicotine or tobacco, such as cigarettes, e-cigarettes, and chewing tobacco. If you need help quitting, ask your health care provider.  Know your numbers. Ask  your health care provider to check your cholesterol and your blood sugar (glucose). Continue to have your blood tested as directed by your health care provider. Do I need screening for cancer? Depending on your health history and family history, you may need to have cancer screening at different stages of your life. This may include screening for:  Breast cancer.  Cervical  cancer.  Lung cancer.  Colorectal cancer. What is my risk for osteoporosis? After menopause, you may be at increased risk for osteoporosis. Osteoporosis is a condition in which bone destruction happens more quickly than new bone creation. To help prevent osteoporosis or the bone fractures that can happen because of osteoporosis, you may take the following actions:  If you are 51-62 years old, get at least 1,000 mg of calcium and at least 600 mg of vitamin D per day.  If you are older than age 69 but younger than age 76, get at least 1,200 mg of calcium and at least 600 mg of vitamin D per day.  If you are older than age 51, get at least 1,200 mg of calcium and at least 800 mg of vitamin D per day. Smoking and drinking excessive alcohol increase the risk of osteoporosis. Eat foods that are rich in calcium and vitamin D, and do weight-bearing exercises several times each week as directed by your health care provider. How does menopause affect my mental health? Depression may occur at any age, but it is more common as you become older. Common symptoms of depression include:  Low or sad mood.  Changes in sleep patterns.  Changes in appetite or eating patterns.  Feeling an overall lack of motivation or enjoyment of activities that you previously enjoyed.  Frequent crying spells. Talk with your health care provider if you think that you are experiencing depression. General instructions See your health care provider for regular wellness exams and vaccines. This may include:  Scheduling regular health, dental, and eye exams.  Getting and maintaining your vaccines. These include: ? Influenza vaccine. Get this vaccine each year before the flu season begins. ? Pneumonia vaccine. ? Shingles vaccine. ? Tetanus, diphtheria, and pertussis (Tdap) booster vaccine. Your health care provider may also recommend other immunizations. Tell your health care provider if you have ever been abused or do  not feel safe at home. Summary  Menopause is a normal process in which your ability to get pregnant comes to an end.  This condition causes hot flashes, night sweats, decreased interest in sex, mood swings, headaches, or lack of sleep.  Treatment for this condition may include hormone replacement therapy.  Take actions to keep yourself healthy, including exercising regularly, eating a healthy diet, watching your weight, and checking your blood pressure and blood sugar levels.  Get screened for cancer and depression. Make sure that you are up to date with all your vaccines. This information is not intended to replace advice given to you by your health care provider. Make sure you discuss any questions you have with your health care provider. Document Revised: 01/06/2018 Document Reviewed: 01/06/2018 Elsevier Patient Education  2020 [employer].

## 2019-07-21 NOTE — Progress Notes (Signed)
Patient ID: Amanda Humphrey, female    DOB: 1965-09-15  Age: 54 y.o. MRN: [id number]  The patient is here for annual preventive examination and management of other chronic and acute problems.    This visit occurred during the SARS-CoV-2 public health emergency.  Safety protocols were in place, including screening questions prior to the visit, additional usage of staff PPE, and extensive cleaning of exam room while observing appropriate contact time as indicated for disinfecting solutions.    The risk factors are reflected in the social history.  The roster of all physicians providing medical care to patient - is listed in the Snapshot section of the chart.  Activities of daily living:  The patient is 100% independent in all ADLs: dressing, toileting, feeding as well as independent mobility  Home safety : The patient has smoke detectors in the home. They wear seatbelts.  There are no firearms at home. There is no violence in the home.   There is no risks for hepatitis, STDs or HIV. There is no   history of blood transfusion. They have no travel history to infectious disease endemic areas of the world.  The patient has seen their dentist in the last six month. They have seen their eye doctor in the last year. They admit to slight hearing difficulty with regard to whispered voices and some television programs.  They have deferred audiologic testing in the last year.  They do not  have excessive sun exposure. Discussed the need for sun protection: hats, long sleeves and use of sunscreen if there is significant sun exposure.   Diet: the importance of a healthy diet is discussed. They do have a healthy diet.  The benefits of regular aerobic exercise were discussed. She has begun a walking program 2 times per week ,  15 minutes.   Depression screen: there are no signs or vegative symptoms of depression- irritability, change in appetite, anhedonia, sadness/tearfullness.  The following portions of the  patient's history were reviewed and updated as appropriate: allergies, current medications, past family history, past medical history,  past surgical history, past social history  and problem list.  Visual acuity was not assessed per patient preference since she has regular follow up with her ophthalmologist. Hearing and body mass index were assessed and reviewed.   During the course of the visit the patient was educated and counseled about appropriate screening and preventive services including : fall prevention , diabetes screening, nutrition counseling, colorectal cancer screening, and recommended immunizations.    CC: The primary encounter diagnosis was Colon cancer screening. Diagnoses of Visit for preventive health examination, Morbid obesity ([place]), Mixed hyperlipidemia, Essential hypertension, and Cervical spondylitis with radiculitis [facility]) were also pertinent to this visit.   1) still with right arm pain and dysfunction of grip due to years of nerve root compression  And surgery  In Jan 2021.  Still has neck pain and muscle spasms continue,  Chest wall pai better now but worse post operatively (now better) still seeing PT once a week and exercising at [employer] 2/week .  Slow improvement , still seeing neurosurgery , had films done last week and C3-4 getting worse.  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  Has to change neck position frequently   2) obesity:  Discussed prior success with [employer]  Lost 50 lbs but cholesterol rose   3) statin myalgia with daily crestor 5 mg.  .  Currently taking crestor once a week  4) has a cervical  cuff  PAP smear done by Toya Smothers prior to surgery    History Rolando has a past medical history of Allergy, Chronic kidney disease, Fibrocystic breast disease (2013), Meniere disease, and Migraines.   She has a past surgical history that includes Cholecystectomy (2011); Appendectomy (1999); Tonsillectomy and adenoidectomy (1975); Cesarean section;  Abdominal hysterectomy (2011); and Breast biopsy (Right, Nov 2012).   Her family history includes Alzheimer's disease in her maternal grandmother and paternal grandmother; Arthritis in her mother; Breast cancer in her maternal grandmother and paternal grandmother; Cancer in her mother; Diabetes in her father; Heart disease in her maternal grandmother; Hyperlipidemia in her father; Hypertension in her father and mother; Stroke in her father.She reports that she has never smoked. She has never used smokeless tobacco. She reports current alcohol use. She reports that she does not use drugs.  Outpatient Medications Prior to Visit  Medication Sig Dispense Refill  . amphetamine-dextroamphetamine (ADDERALL) 10 MG tablet Take 10 mg by mouth every morning.     Marland Kitchen amphetamine-dextroamphetamine (ADDERALL) 20 MG tablet Take 20 mg by mouth every evening.     . carvedilol (COREG) 6.25 MG tablet TAKE 1 TABLET BY MOUTH 2 TIMES DAILY. 180 tablet 3  . cyclobenzaprine (FLEXERIL) 5 MG tablet Take 5 mg by mouth at bedtime.    . furosemide (LASIX) 40 MG tablet Take 40 mg by mouth daily.    . hydrochlorothiazide (HYDRODIURIL) 25 MG tablet Take 1 tablet (25 mg total) by mouth daily. 90 tablet 3  . methocarbamol (ROBAXIN) 500 MG tablet TAKE 1 TABLET TWICE A DAY 90 tablet 7  . omeprazole (PRILOSEC) 40 MG capsule Take 1 capsule (40 mg total) by mouth daily. 90 capsule 1  . rosuvastatin (CRESTOR) 5 MG tablet Take 1 tablet (5 mg total) by mouth daily. 90 tablet 3  . gabapentin (NEURONTIN) 400 MG capsule Take 400 mg by mouth 3 (three) times daily. (Patient not taking: Reported on 07/21/2019)     No facility-administered medications prior to visit.    Review of Systems  Objective:  BP 118/76 (BP Location: Left Arm, Patient Position: Sitting, Cuff Size: Large)   Pulse 74   Temp 97.6 F (36.4 C) (Temporal)   Resp 16   Ht 5\' 9"  (1.753 m)   Wt 241 lb 3.2 oz (109.4 kg)   SpO2 99%   BMI 35.62 kg/m   Physical  Exam    Assessment & Plan:   Problem List Items Addressed This Visit      Unprioritized   Visit for preventive health examination    age appropriate education and counseling updated, referrals for preventative services and immunizations addressed, dietary and smoking counseling addressed, most recent labs reviewed.  I have personally reviewed and have noted:  1) the patient's medical and social history 2) The pt's use of alcohol, tobacco, and illicit drugs 3) The patient's current medications and supplements 4) Functional ability including ADL's, fall risk, home safety risk, hearing and visual impairment 5) Diet and physical activities 6) Evidence for depression or mood disorder 7) The patient's height, weight, and BMI have been recorded in the chart  I have made referrals, and provided counseling and education based on review of the above      Morbid obesity (HCC)     With concurrent hypertension and hyperlipidemia.  have addressed  BMI and recommended wt loss of 10% of body weight over the next 6 months using a low glycemic index diet and regular participation in aerobic exercise a  minimum of 30 minutes, a  minimum of 5 days per week.       Hyperlipidemia    Tolerating rosuvastatin currently once weekly due to recurrent myalgias with more frequent dosing . LFTS are normal.   Lab Results  Component Value Date   CHOL 250 (H) 05/26/2019   HDL 61 05/26/2019   LDLCALC 159 (H) 05/26/2019   LDLDIRECT 137.4 02/25/2013   TRIG 151 (H) 05/26/2019   CHOLHDL 4.1 05/26/2019   Lab Results  Component Value Date   ALT 18 05/26/2019   AST 17 05/26/2019   ALKPHOS 81 05/26/2019   BILITOT 1.2 05/26/2019         Essential hypertension    Well controlled on current regimen or carvedilol and hctz. Renal function stable, no changes today.  Lab Results  Component Value Date   CREATININE 0.91 05/26/2019   Lab Results  Component Value Date   NA 137 05/26/2019   K 4.2 05/26/2019    CL 102 05/26/2019   CO2 27 05/26/2019         Cervical spondylitis with radiculitis [facility])    S/p extensive surgery in Jan 2021 with persistent evidence of nerve damage attributed to the chronicity of her condition.  She is still making progress .       Other Visit Diagnoses    Colon cancer screening    -  Primary   Relevant Orders   Cologuard      I have discontinued Rehana S. Sawhney's gabapentin. I am also having her maintain her amphetamine-dextroamphetamine, amphetamine-dextroamphetamine, carvedilol, hydrochlorothiazide, methocarbamol, furosemide, omeprazole, cyclobenzaprine, and rosuvastatin.  No orders of the defined types were placed in this encounter.   Medications Discontinued During This Encounter  Medication Reason  . gabapentin (NEURONTIN) 400 MG capsule Patient has not taken in last 30 days    Follow-up: No follow-ups on file.   Sherlene Shams, MD

## 2019-07-22 NOTE — Assessment & Plan Note (Signed)
S/p extensive surgery in Jan 2021 with persistent evidence of nerve damage attributed to the chronicity of her condition.  She is still making progress .

## 2019-07-22 NOTE — Assessment & Plan Note (Signed)
Well controlled on current regimen or carvedilol and hctz. Renal function stable, no changes today.  Lab Results  Component Value Date   CREATININE 0.91 05/26/2019   Lab Results  Component Value Date   NA 137 05/26/2019   K 4.2 05/26/2019   CL 102 05/26/2019   CO2 27 05/26/2019

## 2019-07-22 NOTE — Assessment & Plan Note (Addendum)
Tolerating rosuvastatin currently once weekly due to recurrent myalgias with more frequent dosing . LFTS are normal.   Lab Results  Component Value Date   CHOL 250 (H) 05/26/2019   HDL 61 05/26/2019   LDLCALC 159 (H) 05/26/2019   LDLDIRECT 137.4 02/25/2013   TRIG 151 (H) 05/26/2019   CHOLHDL 4.1 05/26/2019   Lab Results  Component Value Date   ALT 18 05/26/2019   AST 17 05/26/2019   ALKPHOS 81 05/26/2019   BILITOT 1.2 05/26/2019

## 2019-07-22 NOTE — Assessment & Plan Note (Addendum)
With concurrent hypertension and hyperlipidemia.  have addressed  BMI and recommended wt loss of 10% of body weight over the next 6 months using a low glycemic index diet and regular participation in aerobic exercise a minimum of 30 minutes, a  minimum of 5 days per week.

## 2019-07-22 NOTE — Therapy (Addendum)
[facility] [address] [place], [place], [address] Phone: [phone]   Fax:  [phone]  Physical Therapy Treatment  Patient Details  Name: Amanda Humphrey MRN: [id number] Date of Birth: 19-Sep-1965 Referring Provider (PT): Remo Lipps, NP   Encounter Date: 07/19/2019    PT End of Session - 07/22/19 0656    Visit Number 22    Number of Visits 27    Date for PT Re-Evaluation 07/27/19    Authorization - Visit Number 4    Authorization - Number of Visits 10    PT Start Time 7672    PT Stop Time 1611    PT Time Calculation (min) 52 min    Activity Tolerance Patient tolerated treatment well;Patient limited by pain    Behavior During Therapy [facility] for tasks assessed/performed             Past Medical History:  Diagnosis Date  . Allergy   . Chronic kidney disease    stones  . Fibrocystic breast disease 2013  . Meniere disease   . Migraines     Past Surgical History:  Procedure Laterality Date  . ABDOMINAL HYSTERECTOMY  2011   laprascopic supracervical, [place]  . APPENDECTOMY  1999  . BREAST BIOPSY Right Nov 2012    fibrocystic disease, [place]  . CESAREAN SECTION     4 total  . CHOLECYSTECTOMY  2011  . TONSILLECTOMY AND ADENOIDECTOMY  1975    There were no vitals filed for this visit.    Pt. reports being tired today. Pt. has not been back to gym.           There.ex.:  Supine 3# bicep curls/ horizontal abduction/ adduction/ flexion 20x each. Seated/standing Nautilus: B sh. Adduction 30#/ lat. Pull downs50#/ tricep ext. 30#/ unilateral ER 20#(20x each). Scapular retraction40#15x2each.   Supine chin tucks with manual feedback/ seated chin tucks   Manual tx.:  Supine/ prone STM to upper thoracic/ cervical musculature Prone STM to mid-thoracic/cervical region (massage with free-up).     PT Long Term Goals - 07/05/19 1319      PT LONG TERM GOAL #1   Title Pt. will improve  FOTO score to >55 to improve independence with ADLs    Baseline Score 34/Goal 55.  3/25: 46 (improvement noted.    Time 4    Period Weeks    Status Partially Met    Target Date 07/27/19      PT LONG TERM GOAL #2   Title Pt. will be able to improve R cervical rotation to >70 degs to be able to be able to improve ROM for driving    Baseline R 55 deg. L 70 deg.  3/25: cervical AROM WFL (all planes)- R rotn. 70 deg. (slight c/o tightness/ discomfort).    Time 4    Period Weeks    Status Achieved    Target Date 05/19/19      PT LONG TERM GOAL #3   Title Pt. will be able to maintain upright posture with scapular retraction for >20 minutes to improve seated posture for work.    Baseline Pt. presents with forward head and rounded shoulders and increased thoracic kyphosis.  6/2: standing desk at work    Time 4    Period Weeks    Status Partially Met    Target Date 07/27/19      PT LONG TERM GOAL #4   Title Pt. will improve R grip strength by  30% to improve ability to grasp coffee mug    Baseline R 39#  L 82#    Time 4    Period Weeks    Status Partially Met    Target Date 07/27/19      PT LONG TERM GOAL #5   Title Pt. will be independent with [employer] ex. program to progress UE/posture strengthening with no increase c/o pain.    Baseline Pt. has attended [employer] for past couple weeks.    Time 4    Period Weeks    Status Partially Met    Target Date 07/27/19           Tx. focus on UE/postural strengthening ex. at [employer] with minimal cuing for correction/ technique. UT muscle tightness remains, esp. at end of day. Pt. will continue with current POC. PT will reassess goals/ send MD certification next tx. session.        Patient will benefit from skilled therapeutic intervention in order to improve the following deficits and impairments:  Decreased mobility, Decreased endurance, Hypomobility, Increased muscle spasms, Impaired sensation, Decreased range of motion,  Decreased scar mobility, Impaired tone, Improper body mechanics, Postural dysfunction, Impaired flexibility, Decreased strength, Decreased activity tolerance  Visit Diagnosis: Muscle weakness (generalized)  Joint stiffness of spine  Pain in right arm     Problem List Patient Active Problem List   Diagnosis Date Noted  . H/O of hemilaminectomy 02/15/2019  . 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[place]) 11/17/2018  . Fatigue 09/23/2017  . History of pneumonia 04/30/2016  . Grief reaction 04/30/2016  . Vitamin D deficiency 02/22/2015  . Hyperlipidemia 01/01/2015  . Visit for preventive health examination 12/04/2013  . Lower extremity edema 09/02/2013  . Essential hypertension 09/02/2013  . History of cardiomyopathy 09/02/2013  . SVT (supraventricular tachycardia) ([place]) 09/02/2013  . Palpitations 09/02/2013  . Contrast media allergy 03/10/2013  . Fibrocystic breast disease   . Morbid obesity ([place]) 02/05/2012  . Meniere disease    Pura Spice, PT, DPT # [phone] 07/22/2019, 4:31 PM  [facility] [address] [place], [place], [address] Phone: [phone]   Fax:  [phone]  Name: Amanda Humphrey MRN: [id number] Date of Birth: 07-14-1965

## 2019-07-22 NOTE — Assessment & Plan Note (Signed)

## 2019-07-26 ENCOUNTER — Encounter: Payer: BC Managed Care – PPO | Admitting: Physical Therapy

## 2019-08-04 ENCOUNTER — Other Ambulatory Visit: Payer: Self-pay

## 2019-08-04 ENCOUNTER — Ambulatory Visit: Payer: BC Managed Care – PPO | Attending: Acute Care | Admitting: Physical Therapy

## 2019-08-04 DIAGNOSIS — M2560 Stiffness of unspecified joint, not elsewhere classified: Secondary | ICD-10-CM | POA: Insufficient documentation

## 2019-08-04 DIAGNOSIS — M79601 Pain in right arm: Secondary | ICD-10-CM

## 2019-08-04 DIAGNOSIS — M6281 Muscle weakness (generalized): Secondary | ICD-10-CM

## 2019-08-04 NOTE — Therapy (Addendum)
[place] [address] [place], [place], [address] Phone: [phone]   Fax:  [phone]  Physical Therapy Treatment  Patient Details  Name: Amanda Humphrey MRN: [id number] Date of Birth: Apr 21, 1965 Referring Provider (PT): Remo Lipps, NP   Encounter Date: 08/04/2019    PT End of Session - 08/04/19 1601    Visit Number 23    Number of Visits 27    Date for PT Re-Evaluation 09/01/19    Authorization - Visit Number 1    Authorization - Number of Visits 10    PT Start Time 0729    PT Stop Time 0820    PT Time Calculation (min) 51 min    Activity Tolerance Patient tolerated treatment well;Patient limited by pain    Behavior During Therapy [facility] for tasks assessed/performed             Past Medical History:  Diagnosis Date  . Allergy   . Chronic kidney disease    stones  . Fibrocystic breast disease 2013  . Meniere disease   . Migraines     Past Surgical History:  Procedure Laterality Date  . ABDOMINAL HYSTERECTOMY  2011   laprascopic supracervical, [place]  . APPENDECTOMY  1999  . BREAST BIOPSY Right Nov 2012    fibrocystic disease, [place]  . CESAREAN SECTION     4 total  . CHOLECYSTECTOMY  2011  . TONSILLECTOMY AND ADENOIDECTOMY  1975    There were no vitals filed for this visit.    Pt. has not been to PT in past 2 weeks. Pt. states she has remained compliant with HEP and aware of neck/ upright posture. Pt. reports upper back stiffness this morning.      Reassessment of goals  Manual:  Seated STM to B UT/ upper thoracic/ cervical musculature B shoulder AROM (all planes) Supine UT/levator stretches 2x each with static holds.   ULTT  There.ex.:  2-3# supine there.ex. Chest press/ shoulder flexion/ horizontal abduction Nautilus: see flowsheet     PT Long Term Goals - 08/06/19 1701      PT LONG TERM GOAL #1   Title Pt. will improve FOTO score to >55 to improve independence with ADLs     Baseline Score 34/Goal 55.  3/25: 46 (improvement noted.    Time 4    Period Weeks    Status Partially Met    Target Date 09/01/19      PT LONG TERM GOAL #2   Title Pt. will be able to improve R cervical rotation to >70 degs to be able to be able to improve ROM for driving    Baseline R 55 deg. L 70 deg.  3/25: cervical AROM WFL (all planes)- R rotn. 70 deg. (slight c/o tightness/ discomfort).    Time 4    Period Weeks    Status Achieved    Target Date 05/19/19      PT LONG TERM GOAL #3   Title Pt. will be able to maintain upright posture with scapular retraction for >20 minutes to improve seated posture for work.    Baseline Pt. presents with forward head and rounded shoulders and increased thoracic kyphosis.  6/2: standing desk at work    Time 4    Period Weeks    Status Partially Met    Target Date 09/01/19      PT LONG TERM GOAL #4   Title Pt. will improve R grip strength by 30% to  improve ability to grasp coffee mug    Baseline R 39#  L 82#    Time 4    Period Weeks    Status Partially Met    Target Date 09/01/19      PT LONG TERM GOAL #5   Title Pt. will be independent with [employer] ex. program to progress UE/posture strengthening with no increase c/o pain.    Baseline Pt. has attended [employer] for past couple weeks.    Time 4    Period Weeks    Status Partially Met    Target Date 09/01/19            Pt. presents with good B shoulder/ cervical AROM all planes of movement. Pt. still benefits from use of MH in the morning to increase muscle flexibility. Pt. has a slight increase in R UE nerve involvement and palpable pain, esp. in R mid-low thoracic paraspinals. Pt. reports decrease in symptoms after manual stretching/ massage. Pt. showing slow but consistent progress with UE strengthening ex. program with proper technique/ posture. Pt. will continue with skilled PT services on a limited basis with primary focus on a gym based ex.  program.       Patient will benefit from skilled therapeutic intervention in order to improve the following deficits and impairments:  Decreased mobility, Decreased endurance, Hypomobility, Increased muscle spasms, Impaired sensation, Decreased range of motion, Decreased scar mobility, Impaired tone, Improper body mechanics, Postural dysfunction, Impaired flexibility, Decreased strength, Decreased activity tolerance  Visit Diagnosis: Muscle weakness (generalized)  Joint stiffness of spine  Pain in right arm     Problem List Patient Active Problem List   Diagnosis Date Noted  . H/O of hemilaminectomy 02/15/2019  . 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[place]) 11/17/2018  . Fatigue 09/23/2017  . History of pneumonia 04/30/2016  . Grief reaction 04/30/2016  . Vitamin D deficiency 02/22/2015  . Hyperlipidemia 01/01/2015  . Visit for preventive health examination 12/04/2013  . Lower extremity edema 09/02/2013  . Essential hypertension 09/02/2013  . History of cardiomyopathy 09/02/2013  . SVT (supraventricular tachycardia) ([place]) 09/02/2013  . Palpitations 09/02/2013  . Contrast media allergy 03/10/2013  . Fibrocystic breast disease   . Morbid obesity ([place]) 02/05/2012  . Meniere disease    Pura Spice, PT, DPT # [id number] 08/07/2019, 5:05 PM  [facility] [address] [place], [place], [address] Phone: [phone]   Fax:  [phone]  Name: Amanda Humphrey MRN: [id number] Date of Birth: April 02, 1965

## 2019-08-11 ENCOUNTER — Ambulatory Visit: Payer: BC Managed Care – PPO | Admitting: Physical Therapy

## 2019-08-12 ENCOUNTER — Ambulatory Visit: Payer: BC Managed Care – PPO | Admitting: Physical Therapy

## 2019-08-12 ENCOUNTER — Encounter: Payer: Self-pay | Admitting: Physical Therapy

## 2019-08-16 DIAGNOSIS — F909 Attention-deficit hyperactivity disorder, unspecified type: Secondary | ICD-10-CM | POA: Diagnosis not present

## 2019-08-21 ENCOUNTER — Other Ambulatory Visit: Payer: Self-pay | Admitting: Internal Medicine

## 2019-08-21 DIAGNOSIS — M4692 Unspecified inflammatory spondylopathy, cervical region: Secondary | ICD-10-CM

## 2019-08-21 DIAGNOSIS — M5412 Radiculopathy, cervical region: Secondary | ICD-10-CM

## 2019-08-22 ENCOUNTER — Other Ambulatory Visit: Payer: Self-pay

## 2019-08-22 ENCOUNTER — Ambulatory Visit: Payer: BC Managed Care – PPO | Admitting: Physical Therapy

## 2019-08-22 DIAGNOSIS — M6281 Muscle weakness (generalized): Secondary | ICD-10-CM | POA: Diagnosis not present

## 2019-08-22 DIAGNOSIS — M79601 Pain in right arm: Secondary | ICD-10-CM

## 2019-08-22 DIAGNOSIS — M2560 Stiffness of unspecified joint, not elsewhere classified: Secondary | ICD-10-CM | POA: Diagnosis not present

## 2019-08-23 NOTE — Therapy (Addendum)
[place] [address] [place], [place], [address] Phone: [phone]   Fax:  [phone]  Physical Therapy Treatment  Patient Details  Name: Amanda Humphrey MRN: [id number] Date of Birth: Apr 22, 1965 Referring Provider (PT): Remo Lipps, NP   Encounter Date: 08/22/2019     PT End of Session - 08/23/19 1723    Visit Number 24    Number of Visits 27    Date for PT Re-Evaluation 09/01/19    Authorization - Visit Number 2    Authorization - Number of Visits 10    PT Start Time 0735    PT Stop Time 0826    PT Time Calculation (min) 51 min    Activity Tolerance Patient tolerated treatment well;Patient limited by pain    Behavior During Therapy [facility] for tasks assessed/performed             Past Medical History:  Diagnosis Date  . Allergy   . Chronic kidney disease    stones  . Fibrocystic breast disease 2013  . Meniere disease   . Migraines     Past Surgical History:  Procedure Laterality Date  . ABDOMINAL HYSTERECTOMY  2011   laprascopic supracervical, [place]  . APPENDECTOMY  1999  . BREAST BIOPSY Right Nov 2012    fibrocystic disease, [place]  . CESAREAN SECTION     4 total  . CHOLECYSTECTOMY  2011  . TONSILLECTOMY AND ADENOIDECTOMY  1975    There were no vitals filed for this visit.           Pt. has been out of town and busy with work over past couple weeks. No new complaints reported today.     There.ex.:  Prone shoulder extension/ scapular retraction (no wt.) Supine 3# UE ex. Program on mat table Nautilus (see flowsheet)  Manual tx.:  Supine cervical stretches (all planes) Prone STM to cervical/ thoracic musculature     PT Long Term Goals - 08/06/19 1701      PT LONG TERM GOAL #1   Title Pt. will improve FOTO score to >55 to improve independence with ADLs    Baseline Score 34/Goal 55.  3/25: 46 (improvement noted.    Time 4    Period Weeks    Status Partially Met     Target Date 09/01/19      PT LONG TERM GOAL #2   Title Pt. will be able to improve R cervical rotation to >70 degs to be able to be able to improve ROM for driving    Baseline R 55 deg. L 70 deg.  3/25: cervical AROM WFL (all planes)- R rotn. 70 deg. (slight c/o tightness/ discomfort).    Time 4    Period Weeks    Status Achieved    Target Date 05/19/19      PT LONG TERM GOAL #3   Title Pt. will be able to maintain upright posture with scapular retraction for >20 minutes to improve seated posture for work.    Baseline Pt. presents with forward head and rounded shoulders and increased thoracic kyphosis.  6/2: standing desk at work    Time 4    Period Weeks    Status Partially Met    Target Date 09/01/19      PT LONG TERM GOAL #4   Title Pt. will improve R grip strength by 30% to improve ability to grasp coffee mug    Baseline R 39#  L 82#  Time 4    Period Weeks    Status Partially Met    Target Date 09/01/19      PT LONG TERM GOAL #5   Title Pt. will be independent with [employer] ex. program to progress UE/posture strengthening with no increase c/o pain.    Baseline Pt. has attended [employer] for past couple weeks.    Time 4    Period Weeks    Status Partially Met    Target Date 09/01/19             No c/o muscle spasms or UE neural involvment during today's tx. session. PT session focused primarily on strengthening ex./ posture correction. Good shoulder extension/ scap. retraction in prone position during STM to upper thoracic/ scapular region. Discuss MD f/u visit and date.      Patient will benefit from skilled therapeutic intervention in order to improve the following deficits and impairments:  Decreased mobility, Decreased endurance, Hypomobility, Increased muscle spasms, Impaired sensation, Decreased range of motion, Decreased scar mobility, Impaired tone, Improper body mechanics, Postural dysfunction, Impaired flexibility, Decreased strength, Decreased  activity tolerance  Visit Diagnosis: Muscle weakness (generalized)  Joint stiffness of spine  Pain in right arm     Problem List Patient Active Problem List   Diagnosis Date Noted  . H/O of hemilaminectomy 02/15/2019  . 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[place]) 11/17/2018  . Fatigue 09/23/2017  . History of pneumonia 04/30/2016  . Grief reaction 04/30/2016  . Vitamin D deficiency 02/22/2015  . Hyperlipidemia 01/01/2015  . Visit for preventive health examination 12/04/2013  . Lower extremity edema 09/02/2013  . Essential hypertension 09/02/2013  . History of cardiomyopathy 09/02/2013  . SVT (supraventricular tachycardia) ([place]) 09/02/2013  . Palpitations 09/02/2013  . Contrast media allergy 03/10/2013  . Fibrocystic breast disease   . Morbid obesity ([place]) 02/05/2012  . Meniere disease    Pura Spice, PT, DPT # [phone] 09/02/2019, 5:40 PM  [place] [facility] [address] [place], [place], [address] Phone: [phone]   Fax:  [phone]  Name: Amanda Humphrey MRN: [id number] Date of Birth: 05-Dec-1965

## 2019-09-01 ENCOUNTER — Other Ambulatory Visit: Payer: Self-pay

## 2019-09-01 ENCOUNTER — Ambulatory Visit: Payer: BC Managed Care – PPO | Attending: Acute Care | Admitting: Physical Therapy

## 2019-09-01 DIAGNOSIS — M2560 Stiffness of unspecified joint, not elsewhere classified: Secondary | ICD-10-CM | POA: Insufficient documentation

## 2019-09-01 DIAGNOSIS — M79601 Pain in right arm: Secondary | ICD-10-CM | POA: Diagnosis not present

## 2019-09-01 DIAGNOSIS — M6281 Muscle weakness (generalized): Secondary | ICD-10-CM | POA: Insufficient documentation

## 2019-09-08 ENCOUNTER — Other Ambulatory Visit: Payer: Self-pay

## 2019-09-08 ENCOUNTER — Ambulatory Visit: Payer: BC Managed Care – PPO | Admitting: Physical Therapy

## 2019-09-08 DIAGNOSIS — M79601 Pain in right arm: Secondary | ICD-10-CM | POA: Diagnosis not present

## 2019-09-08 DIAGNOSIS — M6281 Muscle weakness (generalized): Secondary | ICD-10-CM

## 2019-09-08 DIAGNOSIS — M2560 Stiffness of unspecified joint, not elsewhere classified: Secondary | ICD-10-CM | POA: Diagnosis not present

## 2019-09-08 NOTE — Therapy (Addendum)
[place] [facility] [address] [place], [place], [address] Phone: [phone]   Fax:  [phone]  Physical Therapy Treatment  Patient Details  Name: Amanda Humphrey MRN: [id number] Date of Birth: [birth year] Referring Provider (PT): Remo Lipps, NP   Encounter Date: 09/08/2019    PT End of Session - 09/08/19 2029    Visit Number 26    Number of Visits 29    Date for PT Re-Evaluation 09/29/19    Authorization - Visit Number 2    Authorization - Number of Visits 10    PT Start Time 0732    PT Stop Time 0820    PT Time Calculation (min) 48 min    Activity Tolerance Patient tolerated treatment well;Patient limited by pain    Behavior During Therapy [facility] for tasks assessed/performed             Past Medical History:  Diagnosis Date  . Allergy   . Chronic kidney disease    stones  . Fibrocystic breast disease 2013  . Meniere disease   . Migraines     Past Surgical History:  Procedure Laterality Date  . ABDOMINAL HYSTERECTOMY  2011   laprascopic supracervical, [place]  . APPENDECTOMY  1999  . BREAST BIOPSY Right Nov 2012    fibrocystic disease, [place]  . CESAREAN SECTION     4 total  . CHOLECYSTECTOMY  2011  . TONSILLECTOMY AND ADENOIDECTOMY  1975    There were no vitals filed for this visit.      Pt. reports no new complaints. Pt. continues to be busy at work.       Manual:  Seated STM to B UT/ upper thoracic/ cervical musculature B shoulder AROM (all planes) Supine UT/levator stretches 2x each with static holds.   ULTT  There.ex.:  B UBE 3 min. f/b 2-3# supine there.ex. Chest press/ shoulder flexion/ horizontal abduction Nautilus: see flowsheet (no increase in wt)     PT Long Term Goals - 09/09/19 0921      PT LONG TERM GOAL #1   Title Pt. will improve FOTO score to >55 to improve independence with ADLs    Baseline Score 34/Goal 55.  3/25: 46 (improvement noted.    Time 4    Period  Weeks    Status Partially Met    Target Date 10/06/19      PT LONG TERM GOAL #2   Title Pt. will be able to improve R cervical rotation to >70 degs to be able to be able to improve ROM for driving    Baseline R 55 deg. L 70 deg.  3/25: cervical AROM WFL (all planes)- R rotn. 70 deg. (slight c/o tightness/ discomfort).    Time 4    Period Weeks    Status Achieved      PT LONG TERM GOAL #3   Title Pt. will be able to maintain upright posture with scapular retraction for >20 minutes to improve seated posture for work.    Baseline Pt. presents with forward head and rounded shoulders and increased thoracic kyphosis.  6/2: standing desk at work    Time 4    Period Weeks    Status Partially Met    Target Date 10/06/19      PT LONG TERM GOAL #4   Title Pt. will improve R grip strength by 30% to improve ability to grasp coffee mug    Baseline R 39#  L 82#  Time 4    Period Weeks    Status Partially Met    Target Date 10/06/19      PT LONG TERM GOAL #5   Title Pt. will be independent with [employer] ex. program to progress UE/posture strengthening with no increase c/o pain.    Baseline Pt. has attended [employer] for past couple weeks.    Time 4    Period Weeks    Status Partially Met    Target Date 10/06/19            Tx. plan continues to focus on generalized UE strengthening with focus on proper posture/ head positioning. Pt. understands importance of maintaining a consistent ther.ex. program and avoiding pain provoking movement patterns. Pt. continues to show consistent progress with UE strengthening ex. in clinic. See updated goals.       Patient will benefit from skilled therapeutic intervention in order to improve the following deficits and impairments:  Decreased mobility, Decreased endurance, Hypomobility, Increased muscle spasms, Impaired sensation, Decreased range of motion, Decreased scar mobility, Impaired tone, Improper body mechanics, Postural dysfunction,  Impaired flexibility, Decreased strength, Decreased activity tolerance  Visit Diagnosis: Muscle weakness (generalized)  Joint stiffness of spine  Pain in right arm     Problem List Patient Active Problem List   Diagnosis Date Noted  . H/O of hemilaminectomy 02/15/2019  . 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[place]) 11/17/2018  . Fatigue 09/23/2017  . History of pneumonia 04/30/2016  . Grief reaction 04/30/2016  . Vitamin D deficiency 02/22/2015  . Hyperlipidemia 01/01/2015  . Visit for preventive health examination 12/04/2013  . Lower extremity edema 09/02/2013  . Essential hypertension 09/02/2013  . History of cardiomyopathy 09/02/2013  . SVT (supraventricular tachycardia) ([place]) 09/02/2013  . Palpitations 09/02/2013  . Contrast media allergy 03/10/2013  . Fibrocystic breast disease   . Morbid obesity ([place]) 02/05/2012  . Meniere disease    Pura Spice, PT, DPT # [phone] 09/11/2019, 9:23 AM  [facility] [facility] [address] [place], [place], [address] Phone: [phone]   Fax:  [phone]  Name: DYNESHIA BACCAM MRN: [id number] Date of Birth: September 26, 1965

## 2019-09-08 NOTE — Therapy (Addendum)
[place] [facility] [address] [place], [place], [address] Phone: [phone]   Fax:  [phone]  Physical Therapy Treatment  Patient Details  Name: Amanda Humphrey MRN: [id number] Date of Birth: 07/06/1965 Referring Provider (PT): Megan Ann Brissie, NP   Encounter Date: 09/01/2019   Time:  736 to 822    PT End of Session - 09/08/19 1614    Visit Number 25    Number of Visits 29    Date for PT Re-Evaluation 09/29/19    Authorization - Visit Number 1    Authorization - Number of Visits 10    Activity Tolerance Patient tolerated treatment well;Patient limited by pain    Behavior During Therapy [facility] for tasks assessed/performed             Past Medical History:  Diagnosis Date  . Allergy   . Chronic kidney disease    stones  . Fibrocystic breast disease 2013  . Meniere disease   . Migraines     Past Surgical History:  Procedure Laterality Date  . ABDOMINAL HYSTERECTOMY  2011   laprascopic supracervical, DeFrancesco  . APPENDECTOMY  1999  . BREAST BIOPSY Right Nov 2012    fibrocystic disease, [place]  . CESAREAN SECTION     4 total  . CHOLECYSTECTOMY  2011  . TONSILLECTOMY AND ADENOIDECTOMY  1975    There were no vitals filed for this visit.      Pt. reports no radicular symptoms this morning and states she is doing alright. Pt. used heat on neck this morning. Pt. reports benefit from change in desk modifications at work.           There.ex.:  B UBE 3 min. F/b (consistent cadence) Nautilus (see flowsheet) Standing B UE AROM all planes of movement.  Reassessment of cervical AROM (all planes) Reviewed HEP     PT Long Term Goals - 08/06/19 1701      PT LONG TERM GOAL #1   Title Pt. will improve FOTO score to >55 to improve independence with ADLs    Baseline Score 34/Goal 55.  3/25: 46 (improvement noted.    Time 4    Period Weeks    Status Partially Met    Target Date 09/01/19      PT LONG TERM GOAL  #2   Title Pt. will be able to improve R cervical rotation to >70 degs to be able to be able to improve ROM for driving    Baseline R 55 deg. L 70 deg.  3/25: cervical AROM WFL (all planes)- R rotn. 70 deg. (slight c/o tightness/ discomfort).    Time 4    Period Weeks    Status Achieved    Target Date 05/19/19      PT LONG TERM GOAL #3   Title Pt. will be able to maintain upright posture with scapular retraction for >20 minutes to improve seated posture for work.    Baseline Pt. presents with forward head and rounded shoulders and increased thoracic kyphosis.  6/2: standing desk at work    Time 4    Period Weeks    Status Partially Met    Target Date 09/01/19      PT LONG TERM GOAL #4   Title Pt. will improve R grip strength by 30% to improve ability to grasp coffee mug    Baseline R 39#  L 82#    Time 4    Period Weeks      Status Partially Met    Target Date 09/01/19      PT LONG TERM GOAL #5   Title Pt. will be independent with [employer] ex. program to progress UE/posture strengthening with no increase c/o pain.    Baseline Pt. has attended [employer] for past couple weeks.    Time 4    Period Weeks    Status Partially Met    Target Date 09/01/19            PT tx. focused on ther.ex./ strengthening and UE muscle endurance to improve pain-free daily tasks. Minimal to no cuing to correct shoulder posture/ head position. Slight UT activation noted during UBE with no increase c/o pain. No change to HEP with instruction to be more consistent at [employer].       Patient will benefit from skilled therapeutic intervention in order to improve the following deficits and impairments:  Decreased mobility, Decreased endurance, Hypomobility, Increased muscle spasms, Impaired sensation, Decreased range of motion, Decreased scar mobility, Impaired tone, Improper body mechanics, Postural dysfunction, Impaired flexibility, Decreased strength, Decreased activity  tolerance  Visit Diagnosis: Muscle weakness (generalized)  Joint stiffness of spine  Pain in right arm     Problem List Patient Active Problem List   Diagnosis Date Noted  . H/O of hemilaminectomy 02/15/2019  . 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[place]) 11/17/2018  . Fatigue 09/23/2017  . History of pneumonia 04/30/2016  . Grief reaction 04/30/2016  . Vitamin D deficiency 02/22/2015  . Hyperlipidemia 01/01/2015  . Visit for preventive health examination 12/04/2013  . Lower extremity edema 09/02/2013  . Essential hypertension 09/02/2013  . History of cardiomyopathy 09/02/2013  . SVT (supraventricular tachycardia) ([place]) 09/02/2013  . Palpitations 09/02/2013  . Contrast media allergy 03/10/2013  . Fibrocystic breast disease   . Morbid obesity ([place]) 02/05/2012  . Meniere disease    Pura Spice, PT, DPT # [phone] 09/09/2019, 8:48 AM  [place] [facility] [address] [place], [place], [address] Phone: [phone]   Fax:  [phone]  Name: Amanda Humphrey MRN: [id number] Date of Birth: Dec 24, 1965

## 2019-09-12 ENCOUNTER — Ambulatory Visit: Payer: BC Managed Care – PPO | Admitting: Physical Therapy

## 2019-09-12 ENCOUNTER — Other Ambulatory Visit: Payer: Self-pay

## 2019-09-12 DIAGNOSIS — M79601 Pain in right arm: Secondary | ICD-10-CM

## 2019-09-12 DIAGNOSIS — M2560 Stiffness of unspecified joint, not elsewhere classified: Secondary | ICD-10-CM

## 2019-09-12 DIAGNOSIS — M6281 Muscle weakness (generalized): Secondary | ICD-10-CM

## 2019-09-19 ENCOUNTER — Ambulatory Visit: Payer: BC Managed Care – PPO | Admitting: Physical Therapy

## 2019-09-19 ENCOUNTER — Other Ambulatory Visit: Payer: Self-pay

## 2019-09-19 DIAGNOSIS — M79601 Pain in right arm: Secondary | ICD-10-CM

## 2019-09-19 DIAGNOSIS — M2560 Stiffness of unspecified joint, not elsewhere classified: Secondary | ICD-10-CM

## 2019-09-19 DIAGNOSIS — M6281 Muscle weakness (generalized): Secondary | ICD-10-CM

## 2019-09-20 NOTE — Therapy (Addendum)
[place] [facility] [address] [place], [place], [address] Phone: [phone]   Fax:  [phone]  Physical Therapy Treatment  Patient Details  Name: Amanda Humphrey MRN: [id number] Date of Birth: 30-Sep-1965 Referring Provider (PT): Remo Lipps, NP   Encounter Date: 09/12/2019    PT End of Session - 09/20/19 1651    Visit Number 27    Number of Visits 29    Date for PT Re-Evaluation 09/29/19    Authorization - Visit Number 3    Authorization - Number of Visits 10    PT Start Time 0741    PT Stop Time 0824    PT Time Calculation (min) 43 min    Activity Tolerance Patient tolerated treatment well;Patient limited by pain    Behavior During Therapy [facility] for tasks assessed/performed             Past Medical History:  Diagnosis Date  . Allergy   . Chronic kidney disease    stones  . Fibrocystic breast disease 2013  . Meniere disease   . Migraines     Past Surgical History:  Procedure Laterality Date  . ABDOMINAL HYSTERECTOMY  2011   laprascopic supracervical, DeFrancesco  . APPENDECTOMY  1999  . BREAST BIOPSY Right Nov 2012    fibrocystic disease, [place]  . CESAREAN SECTION     4 total  . CHOLECYSTECTOMY  2011  . TONSILLECTOMY AND ADENOIDECTOMY  1975    There were no vitals filed for this visit.     Pt. states she is tired but ready for PT today. No new complaints.     There.ex.:  Supine B shoulder AROM (all planes) 10x.  Supine 3# UE ex. Program. Supine cervical AROM/ chin tucks. Nautilus: See flowsheet Standing D1/D2 with no resistance      PT Long Term Goals - 09/09/19 0921      PT LONG TERM GOAL #1   Title Pt. will improve FOTO score to >55 to improve independence with ADLs    Baseline Score 34/Goal 55.  3/25: 46 (improvement noted.    Time 4    Period Weeks    Status Partially Met    Target Date 10/06/19      PT LONG TERM GOAL #2   Title Pt. will be able to improve R cervical rotation to  >70 degs to be able to be able to improve ROM for driving    Baseline R 55 deg. L 70 deg.  3/25: cervical AROM WFL (all planes)- R rotn. 70 deg. (slight c/o tightness/ discomfort).    Time 4    Period Weeks    Status Achieved      PT LONG TERM GOAL #3   Title Pt. will be able to maintain upright posture with scapular retraction for >20 minutes to improve seated posture for work.    Baseline Pt. presents with forward head and rounded shoulders and increased thoracic kyphosis.  6/2: standing desk at work    Time 4    Period Weeks    Status Partially Met    Target Date 10/06/19      PT LONG TERM GOAL #4   Title Pt. will improve R grip strength by 30% to improve ability to grasp coffee mug    Baseline R 39#  L 82#    Time 4    Period Weeks    Status Partially Met    Target Date 10/06/19  PT LONG TERM GOAL #5   Title Pt. will be independent with [employer] ex. program to progress UE/posture strengthening with no increase c/o pain.    Baseline Pt. has attended [employer] for past couple weeks.    Time 4    Period Weeks    Status Partially Met    Target Date 10/06/19             Pt. did well during tx. session with no increase c/o pain in neck/ UE. Good technique during resisted ther.ex. Good scapular contraction with no UT muscle activation during resisted standing ther.ex.      Patient will benefit from skilled therapeutic intervention in order to improve the following deficits and impairments:  Decreased mobility, Decreased endurance, Hypomobility, Increased muscle spasms, Impaired sensation, Decreased range of motion, Decreased scar mobility, Impaired tone, Improper body mechanics, Postural dysfunction, Impaired flexibility, Decreased strength, Decreased activity tolerance  Visit Diagnosis: Muscle weakness (generalized)  Joint stiffness of spine  Pain in right arm     Problem List Patient Active Problem List   Diagnosis Date Noted  . H/O of  hemilaminectomy 02/15/2019  . 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[place]) 11/17/2018  . Fatigue 09/23/2017  . History of pneumonia 04/30/2016  . Grief reaction 04/30/2016  . Vitamin D deficiency 02/22/2015  . Hyperlipidemia 01/01/2015  . Visit for preventive health examination 12/04/2013  . Lower extremity edema 09/02/2013  . Essential hypertension 09/02/2013  . History of cardiomyopathy 09/02/2013  . SVT (supraventricular tachycardia) ([place]) 09/02/2013  . Palpitations 09/02/2013  . Contrast media allergy 03/10/2013  . Fibrocystic breast disease   . Morbid obesity ([place]) 02/05/2012  . Meniere disease    Pura Spice, PT, DPT # [phone] 09/15/2019, 3:22 PM  [facility] [address] [place], [place], [address] Phone: [phone]   Fax:  [phone]  Name: OAKLIE DURRETT MRN: [id number] Date of Birth: 02-11-1965

## 2019-09-23 NOTE — Therapy (Addendum)
[facility] [address] [place], [place], [address] Phone: [phone]   Fax:  [phone]  Physical Therapy Treatment  Patient Details  Name: Amanda Humphrey MRN: [id number] Date of Birth: [birth year] Referring Provider (PT): Remo Lipps, NP   Encounter Date: 09/19/2019     PT End of Session - 09/23/19 1246    Visit Number 28    Number of Visits 29    Date for PT Re-Evaluation 09/29/19    Authorization - Visit Number 4    Authorization - Number of Visits 10    PT Start Time 0730    PT Stop Time 0822    PT Time Calculation (min) 52 min    Activity Tolerance Patient tolerated treatment well;Patient limited by pain    Behavior During Therapy [facility] for tasks assessed/performed             Past Medical History:  Diagnosis Date  . Allergy   . Chronic kidney disease    stones  . Fibrocystic breast disease 2013  . Meniere disease   . Migraines     Past Surgical History:  Procedure Laterality Date  . ABDOMINAL HYSTERECTOMY  2011   laprascopic supracervical, DeFrancesco  . APPENDECTOMY  1999  . BREAST BIOPSY Right Nov 2012    fibrocystic disease, Pat Patrick  . CESAREAN SECTION     4 total  . CHOLECYSTECTOMY  2011  . TONSILLECTOMY AND ADENOIDECTOMY  1975    There were no vitals filed for this visit.    Pt. reports no new complaints.         Therex:  Reviewed HEP [employer] ex. Program (see flowsheet)     PT Long Term Goals - 09/21/19 1112      PT LONG TERM GOAL #1   Title Pt. will improve FOTO score to >55 to improve independence with ADLs    Baseline Score 34/Goal 55.  3/25: 46 (improvement noted.    Time 4    Period Weeks    Status Partially Met    Target Date 09/19/19      PT LONG TERM GOAL #2   Title Pt. will be able to improve R cervical rotation to >70 degs to be able to be able to improve ROM for driving    Baseline R 55 deg. L 70 deg.  3/25: cervical AROM WFL (all planes)- R rotn. 70 deg.  (slight c/o tightness/ discomfort).    Time 4    Period Weeks    Status Achieved      PT LONG TERM GOAL #3   Title Pt. will be able to maintain upright posture with scapular retraction for >20 minutes to improve seated posture for work.    Baseline Pt. presents with forward head and rounded shoulders and increased thoracic kyphosis.  6/2: standing desk at work    Time 4    Period Weeks    Status Achieved    Target Date 09/19/19      PT LONG TERM GOAL #4   Title Pt. will improve R grip strength by 30% to improve ability to grasp coffee mug    Baseline R 39#  L 82#    Time 4    Period Weeks    Status Partially Met    Target Date 09/19/19      PT LONG TERM GOAL #5   Title Pt. will be independent with [employer] ex. program to progress UE/posture strengthening with no increase  c/o pain.    Baseline Pt. has attended [employer] for past couple weeks.    Time 4    Period Weeks    Status Achieved    Target Date 09/19/19             Pt. has worked hard with PT over past several months. Pt. understands the current progression of postural/ strengthening ex. and the benefits of a consistent gym based ex. program. Pt. will be discharged from PT at this time with focus on independent based HEP. Pt. instructed to contact PT if any questions or issues. See updated goals.      Patient will benefit from skilled therapeutic intervention in order to improve the following deficits and impairments:  Decreased mobility, Decreased endurance, Hypomobility, Increased muscle spasms, Impaired sensation, Decreased range of motion, Decreased scar mobility, Impaired tone, Improper body mechanics, Postural dysfunction, Impaired flexibility, Decreased strength, Decreased activity tolerance  Visit Diagnosis: Muscle weakness (generalized)  Joint stiffness of spine  Pain in right arm     Problem List Patient Active Problem List   Diagnosis Date Noted  . H/O of hemilaminectomy 02/15/2019   . 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[place]) 11/17/2018  . Fatigue 09/23/2017  . History of pneumonia 04/30/2016  . Grief reaction 04/30/2016  . Vitamin D deficiency 02/22/2015  . Hyperlipidemia 01/01/2015  . Visit for preventive health examination 12/04/2013  . Lower extremity edema 09/02/2013  . Essential hypertension 09/02/2013  . History of cardiomyopathy 09/02/2013  . SVT (supraventricular tachycardia) ([place]) 09/02/2013  . Palpitations 09/02/2013  . Contrast media allergy 03/10/2013  . Fibrocystic breast disease   . Morbid obesity ([place]) 02/05/2012  . Meniere disease    Pura Spice, PT, DPT # [id number] 09/29/2019, 11:34 AM  [facility] [address] [place], [place], [address] Phone: [phone]   Fax:  [phone]  Name: Amanda Humphrey MRN: [id number] Date of Birth: February 17, 1965

## 2019-12-29 ENCOUNTER — Other Ambulatory Visit: Payer: Self-pay | Admitting: Internal Medicine

## 2020-01-16 ENCOUNTER — Other Ambulatory Visit: Payer: Self-pay

## 2020-01-16 ENCOUNTER — Ambulatory Visit
Admission: RE | Admit: 2020-01-16 | Discharge: 2020-01-16 | Disposition: A | Discharge status: Home / Self Care | Payer: BC Managed Care – PPO | Source: Ambulatory Visit | Attending: Sports Medicine | Admitting: Sports Medicine

## 2020-01-16 VITALS — BP 142/74 | HR 78 | Temp 98.2°F | Resp 18 | Ht 69.0 in | Wt 241.2 lb

## 2020-01-16 DIAGNOSIS — J0100 Acute maxillary sinusitis, unspecified: Secondary | ICD-10-CM | POA: Insufficient documentation

## 2020-01-16 DIAGNOSIS — H9201 Otalgia, right ear: Secondary | ICD-10-CM | POA: Diagnosis not present

## 2020-01-16 DIAGNOSIS — Z20822 Contact with and (suspected) exposure to covid-19: Secondary | ICD-10-CM | POA: Insufficient documentation

## 2020-01-16 LAB — RESP PANEL BY RT-PCR (FLU A&B, COVID) ARPGX2
Influenza A by PCR: NEGATIVE
Influenza B by PCR: NEGATIVE
SARS Coronavirus 2 by RT PCR: NEGATIVE

## 2020-01-16 MED ORDER — BENZONATATE 100 MG PO CAPS
100.0000 mg | ORAL_CAPSULE | Freq: Three times a day (TID) | ORAL | 0 refills | Status: DC
Start: 2020-01-16 — End: 2021-07-14

## 2020-01-16 MED ORDER — DOXYCYCLINE HYCLATE 100 MG PO CAPS
100.0000 mg | ORAL_CAPSULE | Freq: Two times a day (BID) | ORAL | 0 refills | Status: DC
Start: 2020-01-16 — End: 2020-10-22

## 2020-01-16 NOTE — ED Provider Notes (Signed)
[facility]    CSN: [id number] Arrival date & time: 01/16/20  1150      History   Chief Complaint Chief Complaint  Patient presents with  . Appointment  . Otalgia    right    HPI Amanda Humphrey is a 54 y.o. female.   Patient is a pleasant 54 year old female who presents for evaluation of right ear pain right facial pain associated headache that began a few days ago.  She did have exposure to influenza A as her daughter is home from college and came down with it acutely.  She has a history of chronic sinusitis and is being followed by Dr. [place] McQueen at [facility].  She reports a cough postnasal drip and just feeling not well.  That said no fever shakes chills.  No nausea vomiting diarrhea.  No GI or GU symptoms.  She is noted no vision changes or any neurological changes.  She has had her COVID-19 vaccination x2.  No red flag signs or symptoms offered.       Past Medical History:  Diagnosis Date  . Allergy   . Chronic kidney disease    stones  . Fibrocystic breast disease 2013  . Meniere disease   . Migraines     Patient Active Problem List   Diagnosis Date Noted  . H/O of hemilaminectomy 02/15/2019  . 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 Fatigue 09/23/2017  . History of pneumonia 04/30/2016  . Grief reaction 04/30/2016  . Vitamin D deficiency 02/22/2015  . Hyperlipidemia 01/01/2015  . Visit for preventive health examination 12/04/2013  . Lower extremity edema 09/02/2013  . Essential hypertension 09/02/2013  . History of cardiomyopathy 09/02/2013  . SVT (supraventricular tachycardia) ([facility]) 09/02/2013  . Palpitations 09/02/2013  . Contrast media allergy 03/10/2013  . Fibrocystic breast disease   . Morbid obesity ([facility]) 02/05/2012  . Meniere disease     Past Surgical History:  Procedure Laterality Date  . ABDOMINAL HYSTERECTOMY  2011   laprascopic supracervical, DeFrancesco  . APPENDECTOMY  1999  . BREAST BIOPSY Right  Nov 2012    fibrocystic disease, Michela Pitcher  . CESAREAN SECTION     4 total  . CHOLECYSTECTOMY  2011  . TONSILLECTOMY AND ADENOIDECTOMY  1975    OB History   No obstetric history on file.      Home Medications    Prior to Admission medications   Medication Sig Start Date End Date Taking? Authorizing Provider  amphetamine-dextroamphetamine (ADDERALL) 10 MG tablet Take 10 mg by mouth every morning.  09/22/18  Yes [provider]  amphetamine-dextroamphetamine (ADDERALL) 20 MG tablet Take 20 mg by mouth every evening.  09/22/18  Yes [provider]  carvedilol (COREG) 6.25 MG tablet TAKE 1 TABLET BY MOUTH 2 TIMES DAILY. 01/11/19  Yes Gollan, Tollie Pizza, MD  cyclobenzaprine (FLEXERIL) 5 MG tablet Take 5 mg by mouth at bedtime. 04/28/19  Yes [provider]  furosemide (LASIX) 40 MG tablet Take 40 mg by mouth daily.   Yes [provider]  hydrochlorothiazide (HYDRODIURIL) 25 MG tablet TAKE 1 TABLET DAILY 12/29/19  Yes Sherlene Shams, MD  methocarbamol (ROBAXIN) 500 MG tablet TAKE 1 TABLET TWICE A DAY 03/07/19  Yes Sherlene Shams, MD  omeprazole (PRILOSEC) 40 MG capsule TAKE 1 CAPSULE DAILY 08/22/19  Yes Sherlene Shams, MD  rosuvastatin (CRESTOR) 5 MG tablet Take 1 tablet (5 mg total) by mouth daily. 05/30/19  Yes Antonieta Iba, MD  benzonatate (TESSALON) 100 MG capsule Take 1 capsule (100 mg total) by mouth every 8 (eight) hours. 01/16/20   Delton See, MD  doxycycline (VIBRAMYCIN) 100 MG capsule Take 1 capsule (100 mg total) by mouth 2 (two) times daily. 01/16/20   Delton See, MD    Family History Family History  Problem Relation Age of Onset  . Hypertension Mother   . Cancer Mother        ovarian  . Arthritis Mother   . Hyperlipidemia Father   . Hypertension Father   . Diabetes Father   . Stroke Father   . Alzheimer's disease Maternal Grandmother   . Heart disease Maternal Grandmother   . Breast cancer Maternal Grandmother   . Alzheimer's  disease Paternal Grandmother   . Breast cancer Paternal Grandmother     Social History Social History   Tobacco Use  . Smoking status: Never Smoker  . Smokeless tobacco: Never Used  Vaping Use  . Vaping Use: Never used  Substance Use Topics  . Alcohol use: Yes    Comment: social  . Drug use: No     Allergies   Bee venom, Codeine, Hydrocodone, Tramadol, and Oxycodone   Review of Systems Review of Systems  Constitutional: Positive for fatigue. Negative for activity change, appetite change, chills, diaphoresis and fever.  HENT: Positive for congestion, ear pain, postnasal drip, sinus pressure and sinus pain.   Eyes: Negative for pain.  Respiratory: Positive for cough. Negative for apnea, choking, chest tightness, shortness of breath, wheezing and stridor.   Cardiovascular: Negative for chest pain.  Gastrointestinal: Negative for abdominal pain.  Musculoskeletal: Positive for myalgias.  Neurological: Positive for headaches.  All other systems reviewed and are negative.    Physical Exam Triage Vital Signs ED Triage Vitals  Enc Vitals Group     BP 01/16/20 1231 (!) 142/74     Pulse Rate 01/16/20 1231 78     Resp 01/16/20 1231 18     Temp 01/16/20 1231 98.2 F (36.8 C)     Temp Source 01/16/20 1231 Oral     SpO2 01/16/20 1231 100 %     Weight 01/16/20 1229 241 lb 2.9 oz (109.4 kg)     Height 01/16/20 1229 5\' 9"  (1.753 m)     Head Circumference --      Peak Flow --      Pain Score 01/16/20 1229 5     Pain Loc --      Pain Edu? --      Excl. in GC? --    No data found.  Updated Vital Signs BP (!) 142/74 (BP Location: Right Arm)   Pulse 78   Temp 98.2 F (36.8 C) (Oral)   Resp 18   Ht 5\' 9"  (1.753 m)   Wt 109.4 kg   SpO2 100%   BMI 35.62 kg/m   Visual Acuity Right Eye Distance:   Left Eye Distance:   Bilateral Distance:    Right Eye Near:   Left Eye Near:    Bilateral Near:     Physical Exam  General: Alert, pleasant, no acute distress.   Nontoxic-appearing.  She is unwell but in no acute distress. Head normocephalic atraumatic left TM within normal limits along with the external canal.  Right TM is bulging a little but no erythema no active infection.  Pupils are equal and reactive to light.  Conjunctiva noninjected nonicteric.  Extraocular muscles are intact.  There is no photophobia.  No nystagmus.  She is tender to palpation over the frontal and maxillary sinuses on the right side.  Nose exam is within normal limits.  Oropharynx is moist there is no exudate.  Mucous membranes are moist. Neck there is some mild cervical lymphadenopathy.  She is nontender over the mastoid process.  There is no active evidence of any significant abscess formation.  She has full range of motion but some limitation with extension due to a posterior cervical fusion in the past.  That said neck is supple. Heart regular rate rhythm no murmurs gallops or rubs. Lungs are clear to auscultation bilaterally.  There is no wheeze rhonchi or rubs. Skin exam warm dry intact no lesions or rashes.  Less than 2-second capillary refill. Neuro alert and oriented x4 with no focal deficit. UC Treatments / Results  Labs (all labs ordered are listed, but only abnormal results are displayed) Labs Reviewed  RESP PANEL BY RT-PCR (FLU A&B, COVID) [id number]    EKG   Radiology No results found.  Procedures Procedures (including critical care time)  Medications Ordered in UC Medications - No data to display  Initial Impression / Assessment and Plan / UC Course  I have reviewed the triage vital signs and the nursing notes.  Pertinent labs & imaging results that were available during my care of the patient were reviewed by me and considered in my medical decision making (see chart for details).   Clinical impression right-sided ear facial pain.  Seems consistent with a early sinusitis.  Also on the differential is possible early herpes zoster infection without the rash  just yet.  Treatment plan: 1.  The findings and treat plan were discussed in detail with the patient.  Patient was in agreement.  She voiced verbal understanding. 2.  Given her chronic history as well as her findings of recommended going ahead and treating her with doxycycline 100 mg twice daily #20.  I also gave her Jerilynn Somessalon Perles to help her with her cough.  Also recommended supportive care including over-the-counter meds, plenty of rest plenty of fluids.  Also Mucinex as well as Delsym or another cough medicine.  She can also look into a Nettie pot given her chronic sinusitis but I will leave that to her. 3.  We did discuss the possibility that this could be early zoster and what to look for and when to seek out immediate medical attention. 4.  For now discharged from care should follow-up with us as needed.  Final Clinical Impressions(s) / UC Diagnoses   Final diagnoses:  Subacute maxillary sinusitis  Otalgia of right ear     Discharge Instructions     Given her chronic history as well as her findings of recommended going ahead and treating her with doxycycline 100 mg twice daily #20.  I also gave her Jerilynn Somessalon Perles to help her with her cough.  Also recommended supportive care including over-the-counter meds, plenty of rest plenty of fluids.  Also Mucinex as well as Delsym or another cough medicine.  She can also look into a Nettie pot given her chronic sinusitis but I will leave that to her. We did discuss the possibility that this could be early zoster and what to look for and when to seek out immediate medical attention.    ED Prescriptions    Medication Sig Dispense Auth. Provider   benzonatate (TESSALON) 100 MG capsule Take 1 capsule (100 mg total) by mouth every 8 (eight) hours. 21 capsule Delton SeeBarnes, Roslind Michaux, MD   doxycycline (VIBRAMYCIN)  100 MG capsule Take 1 capsule (100 mg total) by mouth 2 (two) times daily. 20 capsule Delton See, MD     PDMP not reviewed this  encounter.   Delton See, MD 01/17/20 [phone]

## 2020-01-16 NOTE — Discharge Instructions (Signed)
Given her chronic history as well as her findings of recommended going ahead and treating her with doxycycline 100 mg twice daily #20.  I also gave her Jerilynn Som to help her with her cough.  Also recommended supportive care including over-the-counter meds, plenty of rest plenty of fluids.  Also Mucinex as well as Delsym or another cough medicine.  She can also look into a Nettie pot given her chronic sinusitis but I will leave that to her. We did discuss the possibility that this could be early zoster and what to look for and when to seek out immediate medical attention.

## 2020-01-16 NOTE — ED Triage Notes (Signed)
Pt c/o right ear pain, sinus pressure, post nasal drip, and cough. Started yesterday. Her daughter has flu A. Denies fever.

## 2020-01-18 ENCOUNTER — Other Ambulatory Visit: Payer: Self-pay | Admitting: Cardiovascular Disease

## 2020-01-24 DIAGNOSIS — M7918 Myalgia, other site: Secondary | ICD-10-CM | POA: Diagnosis not present

## 2020-01-24 DIAGNOSIS — Z9889 Other specified postprocedural states: Secondary | ICD-10-CM | POA: Diagnosis not present

## 2020-03-30 ENCOUNTER — Other Ambulatory Visit: Payer: Self-pay | Admitting: Cardiovascular Disease

## 2020-04-10 ENCOUNTER — Telehealth: Payer: BC Managed Care – PPO | Admitting: Physician Assistant

## 2020-04-10 DIAGNOSIS — R112 Nausea with vomiting, unspecified: Secondary | ICD-10-CM | POA: Diagnosis not present

## 2020-04-10 MED ORDER — ONDANSETRON 4 MG PO TBDP
4.0000 mg | ORAL_TABLET | Freq: Three times a day (TID) | ORAL | 0 refills | Status: DC | PRN
Start: 2020-04-10 — End: 2021-12-02

## 2020-04-10 NOTE — Progress Notes (Signed)
We are sorry that you are not feeling well. Here is how we plan to help!  Based on what you have shared with me it looks like you have a Virus that is irritating your GI tract.  Vomiting is the forceful emptying of a portion of the stomach's content through the mouth.  Although nausea and vomiting can make you feel miserable, it's important to remember that these are not diseases, but rather symptoms of an underlying illness.  When we treat short term symptoms, we always caution that any symptoms that persist should be fully evaluated in a medical office.  I have submitted a refill of your zofran but made it the kind that disintegrates under your tongue to get into your system faster. If you are still unable to keep things in with this medication change, you need to be seen at nearest Urgent Care for evaluation and to make sure IV fluids, etc are not needed.   HOME CARE:  Drink clear liquids.  This is very important! Dehydration (the lack of fluid) can lead to a serious complication.  Start off with 1 tablespoon every 5 minutes for 8 hours.  You may begin eating bland foods after 8 hours without vomiting.  Start with saltine crackers, white bread, rice, mashed potatoes, applesauce.  After 48 hours on a bland diet, you may resume a normal diet.  Try to go to sleep.  Sleep often empties the stomach and relieves the need to vomit.  GET HELP RIGHT AWAY IF:   Your symptoms do not improve or worsen within 2 days after treatment.  You have a fever for over 3 days.  You cannot keep down fluids after trying the medication.  MAKE SURE YOU:   Understand these instructions.  Will watch your condition.  Will get help right away if you are not doing well or get worse.   Thank you for choosing an e-visit. Your e-visit answers were reviewed by a board certified advanced clinical practitioner to complete your personal care plan. Depending upon the condition, your plan could have included both  over the counter or prescription medications. Please review your pharmacy choice. Be sure that the pharmacy you have chosen is open so that you can pick up your prescription now.  If there is a problem you may message your provider in MyChart to have the prescription routed to another pharmacy. Your safety is important to [place]. If you have drug allergies check your prescription carefully.  For the next 24 hours, you can use MyChart to ask questions about today's visit, request a non-urgent call back, or ask for a work or school excuse from your e-visit provider. You will get an e-mail in the next two days asking about your experience. I hope that your e-visit has been valuable and will speed your recovery.

## 2020-04-10 NOTE — Progress Notes (Signed)
I have spent 5 minutes in review of e-visit questionnaire, review and updating patient chart, medical decision making and response to patient.   Ernesto Zukowski Cody Gisel Vipond, PA-C    

## 2020-04-24 DIAGNOSIS — F900 Attention-deficit hyperactivity disorder, predominantly inattentive type: Secondary | ICD-10-CM | POA: Diagnosis not present

## 2020-07-25 ENCOUNTER — Ambulatory Visit
Admission: RE | Admit: 2020-07-25 | Discharge: 2020-07-25 | Disposition: A | Discharge status: Home / Self Care | Payer: BC Managed Care – PPO | Source: Ambulatory Visit | Attending: Family Medicine | Admitting: Family Medicine

## 2020-07-25 ENCOUNTER — Other Ambulatory Visit: Payer: Self-pay

## 2020-07-25 ENCOUNTER — Ambulatory Visit (INDEPENDENT_AMBULATORY_CARE_PROVIDER_SITE_OTHER): Payer: BC Managed Care – PPO

## 2020-07-25 VITALS — BP 140/57 | HR 68 | Temp 98.2°F | Resp 18

## 2020-07-25 DIAGNOSIS — M25571 Pain in right ankle and joints of right foot: Secondary | ICD-10-CM

## 2020-07-25 DIAGNOSIS — S93401A Sprain of unspecified ligament of right ankle, initial encounter: Secondary | ICD-10-CM

## 2020-07-25 DIAGNOSIS — M899 Disorder of bone, unspecified: Secondary | ICD-10-CM

## 2020-07-25 DIAGNOSIS — M7989 Other specified soft tissue disorders: Secondary | ICD-10-CM | POA: Diagnosis not present

## 2020-07-25 NOTE — ED Provider Notes (Signed)
[facility]    CSN: [id number] Arrival date & time: 07/25/20  1401      History   Chief Complaint Chief Complaint  Patient presents with   Ankle Pain    Right     Appointment    1400     HPI Amanda Humphrey is a 55 y.o. female presenting for right ankle pain x4 days.  Patient states that she accidentally tripped and rolled her ankle.  She says that pain was initially not so bad but as she is continue to bear weight and walk on the affected foot/ankle she has had continued and seemingly worsening pain.  She says that she has sharp pains that occasionally occur in the medial ankle and radiate up the leg a little.  She also admits to some swelling and pain of the right lateral ankle as well.  No numbness, tingling or weakness.  No history of fractures or major injury of this affected extremity.  She has been applying ice to the area but has not taken any Tylenol or Motrin for pain.  She says that she takes Robaxin for a neck issue and tries to avoid other medications.  Patient concerned about possible underlying fracture.  No other injuries, complaints or concerns today.  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   Fatigue 09/23/2017   History of pneumonia 04/30/2016   Grief reaction 04/30/2016   Vitamin D deficiency 02/22/2015   Hyperlipidemia 01/01/2015   Visit for preventive health examination 12/04/2013   Lower extremity edema 09/02/2013   Essential hypertension 09/02/2013   History of cardiomyopathy 09/02/2013   SVT (supraventricular tachycardia) (HCC) 09/02/2013   Palpitations 09/02/2013   Contrast media allergy 03/10/2013   Fibrocystic breast disease    Morbid obesity (HCC) 02/05/2012    Meniere disease     Past Surgical History:  Procedure Laterality Date   ABDOMINAL HYSTERECTOMY  2011   laprascopic supracervical, [place]   APPENDECTOMY  1999   BREAST BIOPSY Right Nov 2012    fibrocystic disease, [place]   CESAREAN SECTION     4 total   CHOLECYSTECTOMY  2011   TONSILLECTOMY AND ADENOIDECTOMY  1975    OB History   No obstetric history on file.      Home Medications    Prior to Admission medications   Medication Sig Start Date End Date Taking? Authorizing Provider  amphetamine-dextroamphetamine (ADDERALL) 10 MG tablet Take 10 mg by mouth every morning.  09/22/18   [provider]  amphetamine-dextroamphetamine (ADDERALL) 20 MG tablet Take 20 mg by mouth every evening.  09/22/18   [provider]  benzonatate (TESSALON) 100 MG capsule Take 1 capsule (100 mg total) by mouth every 8 (eight) hours. 01/16/20   Delton SeeBarnes, Kenneth, MD  carvedilol (COREG) 6.25 MG tablet TAKE 1 TABLET TWICE A DAY 03/30/20   Antonieta IbaGollan, Timothy J, MD  cyclobenzaprine (FLEXERIL) 5 MG tablet Take 5 mg by mouth at bedtime. 04/28/19   [provider]  doxycycline (VIBRAMYCIN) 100 MG capsule Take 1 capsule (100 mg total) by mouth 2 (two) times daily. 01/16/20   Delton SeeBarnes, Kenneth, MD  furosemide (LASIX) 40 MG tablet Take 40 mg by  mouth daily.    [provider]  hydrochlorothiazide (HYDRODIURIL) 25 MG tablet TAKE 1 TABLET DAILY 12/29/19   Sherlene Shams, MD  methocarbamol (ROBAXIN) 500 MG tablet TAKE 1 TABLET TWICE A DAY 03/07/19   Sherlene Shams, MD  omeprazole (PRILOSEC) 40 MG capsule TAKE 1 CAPSULE DAILY 08/22/19   Sherlene Shams, MD  ondansetron (ZOFRAN ODT) 4 MG disintegrating tablet Take 1 tablet (4 mg total) by mouth every 8 (eight) hours as needed for nausea or vomiting. 04/10/20   Waldon Merl, PA-C  rosuvastatin (CRESTOR) 5 MG tablet Take 1 tablet (5 mg total) by mouth daily. 05/30/19   Antonieta Iba, MD    Family History Family History  Problem Relation  Age of Onset   Hypertension Mother    Cancer Mother        ovarian   Arthritis Mother    Hyperlipidemia Father    Hypertension Father    Diabetes Father    Stroke Father    Alzheimer's disease Maternal Grandmother    Heart disease Maternal Grandmother    Breast cancer Maternal Grandmother    Alzheimer's disease Paternal Grandmother    Breast cancer Paternal Grandmother     Social History Social History   Tobacco Use   Smoking status: Never   Smokeless tobacco: Never  Vaping Use   Vaping Use: Never used  Substance Use Topics   Alcohol use: Yes    Comment: social   Drug use: No     Allergies   Bee venom, Codeine, Hydrocodone, Tramadol, and Oxycodone   Review of Systems Review of Systems  Musculoskeletal:  Positive for arthralgias and joint swelling. Negative for gait problem.  Skin:  Negative for color change and wound.  Neurological:  Negative for weakness and numbness.    Physical Exam Triage Vital Signs ED Triage Vitals  Enc Vitals Group     BP 07/25/20 1416 (!) 140/57     Pulse Rate 07/25/20 1416 68     Resp 07/25/20 1416 18     Temp 07/25/20 1416 98.2 F (36.8 C)     Temp Source 07/25/20 1416 Oral     SpO2 07/25/20 1416 100 %     Weight --      Height --      Head Circumference --      Peak Flow --      Pain Score 07/25/20 1418 8     Pain Loc --      Pain Edu? --      Excl. in GC? --    No data found.  Updated Vital Signs BP (!) 140/57 (BP Location: Left Arm)   Pulse 68   Temp 98.2 F (36.8 C) (Oral)   Resp 18   SpO2 100%      Physical Exam Vitals and nursing note reviewed.  Constitutional:      General: She is not in acute distress.    Appearance: Normal appearance. She is not ill-appearing or toxic-appearing.  HENT:     Head: Normocephalic and atraumatic.  Eyes:     General: No scleral icterus.       Right eye: No discharge.        Left eye: No discharge.     Conjunctiva/sclera: Conjunctivae normal.  Cardiovascular:     Rate  and Rhythm: Normal rate and regular rhythm.     Pulses: Normal pulses.  Pulmonary:     Effort: Pulmonary effort is normal. No respiratory distress.  Musculoskeletal:     Cervical back: Neck supple.     Right ankle: Swelling (moderate swelling of  medial and lateral ankle) present. No ecchymosis. Tenderness present over the lateral malleolus, medial malleolus and ATF ligament. Decreased range of motion. Normal pulse.     Right Achilles Tendon: Normal.  Skin:    General: Skin is dry.  Neurological:     General: No focal deficit present.     Mental Status: She is alert. Mental status is at baseline.     Motor: No weakness.     Gait: Gait abnormal.  Psychiatric:        Mood and Affect: Mood normal.        Behavior: Behavior normal.        Thought Content: Thought content normal.     UC Treatments / Results  Labs (all labs ordered are listed, but only abnormal results are displayed) Labs Reviewed - No data to display  EKG   Radiology DG Ankle Complete Right  Result Date: 07/25/2020 CLINICAL DATA:  Status post trauma. EXAM: RIGHT ANKLE - COMPLETE 3+ VIEW COMPARISON:  None. FINDINGS: There is no evidence of an acute fracture, dislocation, or joint effusion. There is no evidence of arthropathy. A 1.4 cm x 1.2 cm benign-appearing predominantly lytic area is seen within the distal right tibia. Mild diffuse soft tissue swelling is seen. IMPRESSION: 1. Diffuse soft tissue swelling without an acute osseous abnormality. 2. Lytic area within the distal right tibia which may be benign in etiology. Further characterization with nonemergent MRI is recommended. Electronically Signed   By: Aram Candela M.D.   On: 07/25/2020 15:22    Procedures Procedures (including critical care time)  Medications Ordered in UC Medications - No data to display  Initial Impression / Assessment and Plan / UC Course  I have reviewed the triage vital signs and the nursing notes.  Pertinent labs & imaging  results that were available during my care of the patient were reviewed by me and considered in my medical decision making (see chart for details).  55 year old female presenting for right ankle pain for the past few days.  She had an accidental inversion injury.  X-ray obtained today.  X-ray independently viewed by me.  X-ray over read by radiologist states there is a 1.4 cm x 1.2 cm benign-appearing predominantly lytic area within the distal right tibia.  There is also soft tissue swelling.  I reviewed this result with patient.  Advised her that radiologist suggest that she obtains a nonemergent MRI.  Given her significant discomfort and this abnormality on the x-ray, patient placed in cam walking boot and I reviewed the RICE guidelines.  Patient given information to contact orthopedics for appointment for further treatment and work-up of her condition.  Patient agreeable to plan.   Final Clinical Impressions(s) / UC Diagnoses   Final diagnoses:  Acute right ankle pain  Sprain of right ankle, unspecified ligament, initial encounter  Bone lesion     Discharge Instructions      SPRAIN: Stressed avoiding painful activities . Reviewed RICE guidelines. Use medications as directed, including NSAIDs. If no NSAIDs have been prescribed for you today, you may take Aleve or Motrin over the counter. May use Tylenol in between doses of NSAIDs.  If no improvement in the next 1-2 weeks, f/u with PCP or return to our office for reexamination, and please feel free to call or return at any time for any questions or concerns you may have and  we will be happy to help you!      Your x-ray does not show any definitive fractures.  You likely have a sprain, but there is an abnormality seen on your x-ray.  There is a bone lesion.  The radiologist feels this is probably benign or not serious but does advise a follow-up with orthopedics not emergently for an MRI.  Please contact one of the following offices below.  In  the meantime, you can take ibuprofen and or Tylenol for discomfort and ice the area.  We placed you in a cam walking boot.  Use this for comfort as well.  You have a condition requiring you to follow up with Orthopedics so please call one of the following office for appointment:   [facility] [address] [place], [place] [address] Phone: [phone]  [facility] [address] [place], [place] [address] Phone: [phone]    ED Prescriptions   None    PDMP not reviewed this encounter.   Shirlee Latch, PA-C 07/25/20 1656

## 2020-07-25 NOTE — ED Triage Notes (Signed)
Patient presents to Urgent Care with complaints of of right ankle pain since Saturday. She states she rolled her ankle and continues to have shooting pain that has worsened. Treating pain with tylenol and applying ice for the swelling.

## 2020-07-25 NOTE — Discharge Instructions (Addendum)
SPRAIN: Stressed avoiding painful activities . Reviewed RICE guidelines. Use medications as directed, including NSAIDs. If no NSAIDs have been prescribed for you today, you may take Aleve or Motrin over the counter. May use Tylenol in between doses of NSAIDs.  If no improvement in the next 1-2 weeks, f/u with PCP or return to our office for reexamination, and please feel free to call or return at any time for any questions or concerns you may have and we will be happy to help you!      Your x-ray does not show any definitive fractures.  You likely have a sprain, but there is an abnormality seen on your x-ray.  There is a bone lesion.  The radiologist feels this is probably benign or not serious but does advise a follow-up with orthopedics not emergently for an MRI.  Please contact one of the following offices below.  In the meantime, you can take ibuprofen and or Tylenol for discomfort and ice the area.  We placed you in a cam walking boot.  Use this for comfort as well.  You have a condition requiring you to follow up with Orthopedics so please call one of the following office for appointment:   [facility] [address] [place], [place] [address] Phone: [phone]  [facility] [address] [place], [place] [address] Phone: [phone]

## 2020-08-01 DIAGNOSIS — F900 Attention-deficit hyperactivity disorder, predominantly inattentive type: Secondary | ICD-10-CM | POA: Diagnosis not present

## 2020-08-06 DIAGNOSIS — S93491D Sprain of other ligament of right ankle, subsequent encounter: Secondary | ICD-10-CM | POA: Diagnosis not present

## 2020-08-06 DIAGNOSIS — M25571 Pain in right ankle and joints of right foot: Secondary | ICD-10-CM | POA: Diagnosis not present

## 2020-08-06 DIAGNOSIS — M958 Other specified acquired deformities of musculoskeletal system: Secondary | ICD-10-CM | POA: Diagnosis not present

## 2020-08-10 DIAGNOSIS — S93491D Sprain of other ligament of right ankle, subsequent encounter: Secondary | ICD-10-CM | POA: Diagnosis not present

## 2020-08-10 DIAGNOSIS — M85661 Other cyst of bone, right lower leg: Secondary | ICD-10-CM | POA: Diagnosis not present

## 2020-08-10 DIAGNOSIS — M25571 Pain in right ankle and joints of right foot: Secondary | ICD-10-CM | POA: Diagnosis not present

## 2020-08-10 DIAGNOSIS — M958 Other specified acquired deformities of musculoskeletal system: Secondary | ICD-10-CM | POA: Diagnosis not present

## 2020-08-10 DIAGNOSIS — X58XXXD Exposure to other specified factors, subsequent encounter: Secondary | ICD-10-CM | POA: Diagnosis not present

## 2020-08-10 DIAGNOSIS — R600 Localized edema: Secondary | ICD-10-CM | POA: Diagnosis not present

## 2020-08-17 ENCOUNTER — Other Ambulatory Visit: Payer: Self-pay | Admitting: Internal Medicine

## 2020-08-17 DIAGNOSIS — M4692 Unspecified inflammatory spondylopathy, cervical region: Secondary | ICD-10-CM

## 2020-08-19 NOTE — Progress Notes (Signed)
Cardiology Office Note  Date:  08/20/2020   ID:  Amanda Humphrey, DOB 1965-04-03, MRN [id number]  PCP:  Sherlene Shams, MD   Chief Complaint  Patient presents with   Other    12 month follow up. Meds reviewed verbally with patient.     HPI:  Amanda Humphrey is a very pleasant 55 year-old woman with remote history of cardiomyopathy approximately 20 years ago after having her third child with mild depression of her ejection fraction which returned back to normal 6 months later,  periodic SVT, palpitations,  lower extremity swelling  who presents for routine follow-up of her arrhythmia, hypertension, fluid retention following neck surgery January 2021  Still with tight muscles around neck Numb down right arm  Rolled right ankle, Has DJD Then PT  Swelling is minimal  On HCTZ and lasix 20 mg couple times a week Creatinine normal  Total cholesterol 250, on off crestor 5 mg  EKG personally reviewed by myself on todays visit Normal sinus rhythm with rate 78 bpm right bundle branch block  [facility] records from neurosurgery reviewed status post right C7-T1 discectomy and foraminotomy/hemilaminectomy February 08, 2019 Performed at [facility] procedure orthostatic hypotension, treated with fluid boluses Taking Valium, gabapentin for muscle spasms Recent office note from neurosurgery indicates she is on Lasix Working with physical therapy, initially using a walker for stability Residual numbness right hand, Improvement in grip and dexterity C8 nerve distribution And ability to tolerate narcotics  post decompression nerve root edema accentuated by postop fluid boluses for postop postural hypotension and apparent allergic reaction to Dilaudid and some rebound swelling upon completion of Decadron taper.  Prior history of cardiomyopathy many years ago that seemed to significantly improve with aggressive diuresis. Approximately 18 years ago, Lasix was provided with 16 pound weight loss and  improvement of her symptoms. On her fourth child she had fluid retention but no recurrent cardiomyopathy   prior echocardiogram 12/16/2010 showing normal ejection fraction rate and 55%, mild MR    PMH:   has a past medical history of Allergy, Chronic kidney disease, Fibrocystic breast disease (2013), Meniere disease, and Migraines.  PSH:    Past Surgical History:  Procedure Laterality Date   ABDOMINAL HYSTERECTOMY  2011   laprascopic supracervical, [place]   APPENDECTOMY  1999   BREAST BIOPSY Right Nov 2012    fibrocystic disease, [place]   CESAREAN SECTION     4 total   CHOLECYSTECTOMY  2011   TONSILLECTOMY AND ADENOIDECTOMY  1975    Current Outpatient Medications  Medication Sig Dispense Refill   amphetamine-dextroamphetamine (ADDERALL) 20 MG tablet Take 20 mg by mouth every evening.      benzonatate (TESSALON) 100 MG capsule Take 1 capsule (100 mg total) by mouth every 8 (eight) hours. 21 capsule 0   carvedilol (COREG) 6.25 MG tablet TAKE 1 TABLET TWICE A DAY 180 tablet 3   cyclobenzaprine (FLEXERIL) 5 MG tablet Take 5 mg by mouth at bedtime.     furosemide (LASIX) 40 MG tablet Take 40 mg by mouth daily.     hydrochlorothiazide (HYDRODIURIL) 25 MG tablet TAKE 1 TABLET DAILY 90 tablet 3   methocarbamol (ROBAXIN) 500 MG tablet TAKE 1 TABLET TWICE A DAY 90 tablet 7   omeprazole (PRILOSEC) 40 MG capsule TAKE 1 CAPSULE DAILY 90 capsule 3   ondansetron (ZOFRAN ODT) 4 MG disintegrating tablet Take 1 tablet (4 mg total) by mouth every 8 (eight) hours as needed for nausea or vomiting. 20 tablet 0  rosuvastatin (CRESTOR) 5 MG tablet Take 1 tablet (5 mg total) by mouth daily. 90 tablet 3   amphetamine-dextroamphetamine (ADDERALL) 10 MG tablet Take 10 mg by mouth every morning.  (Patient not taking: Reported on 08/20/2020)     doxycycline (VIBRAMYCIN) 100 MG capsule Take 1 capsule (100 mg total) by mouth 2 (two) times daily. (Patient not taking: Reported on 08/20/2020) 20 capsule 0   No  current facility-administered medications for this visit.    Allergies:   Bee venom, Codeine, Hydrocodone, Tramadol, and Oxycodone   Social History:  The patient  reports that she has never smoked. She has never used smokeless tobacco. She reports current alcohol use. She reports that she does not use drugs.   Family History:   family history includes Alzheimer's disease in her maternal grandmother and paternal grandmother; Arthritis in her mother; Breast cancer in her maternal grandmother and paternal grandmother; Cancer in her mother; Diabetes in her father; Heart disease in her maternal grandmother; Hyperlipidemia in her father; Hypertension in her father and mother; Stroke in her father.    Review of Systems: Review of Systems  Constitutional: Negative.   HENT: Negative.    Respiratory: Negative.    Cardiovascular: Negative.   Gastrointestinal: Negative.   Musculoskeletal: Negative.   Neurological: Negative.   Psychiatric/Behavioral: Negative.    All other systems reviewed and are negative.  PHYSICAL EXAM: VS:  BP 122/80 (BP Location: Left Arm, Patient Position: Sitting, Cuff Size: Normal)   Pulse 76   Ht 5\' 8"  (1.727 m)   Wt 242 lb (109.8 kg)   SpO2 96%   BMI 36.80 kg/m  , BMI Body mass index is 36.8 kg/m. Constitutional:  oriented to person, place, and time. No distress.  HENT:  Head: Grossly normal Eyes:  no discharge. No scleral icterus.  Neck: No JVD, no carotid bruits  Cardiovascular: Regular rate and rhythm, no murmurs appreciated Pulmonary/Chest: Clear to auscultation bilaterally, no wheezes or rails Abdominal: Soft.  no distension.  no tenderness.  Musculoskeletal: Normal range of motion Neurological:  normal muscle tone. Coordination normal. No atrophy Skin: Skin warm and dry Psychiatric: normal affect, pleasant   Recent Labs: No results found for requested labs within last 8760 hours.    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 No changes made to the medications.  Mixed hyperlipidemia Continue Crestor 5, add Zetia 10 mg daily order CT coronary calcium scoring  Lower extremity edema Symptoms are stable on HCTZ would Lasix   Total encounter time more than 25 minutes  Greater than 50% was spent in counseling and coordination of care with the patient    Orders Placed This Encounter  Procedures   EKG 12-Lead     Signed, 07/23/19, M.D., Ph.D. 08/20/2020  [facility] [place], [place]

## 2020-08-20 ENCOUNTER — Encounter: Payer: Self-pay | Admitting: Cardiovascular Disease

## 2020-08-20 ENCOUNTER — Other Ambulatory Visit: Payer: Self-pay

## 2020-08-20 ENCOUNTER — Ambulatory Visit: Payer: BC Managed Care – PPO | Admitting: Cardiovascular Disease

## 2020-08-20 VITALS — BP 122/80 | HR 76 | Ht 68.0 in | Wt 242.0 lb

## 2020-08-20 DIAGNOSIS — I471 Supraventricular tachycardia: Secondary | ICD-10-CM

## 2020-08-20 DIAGNOSIS — I10 Essential (primary) hypertension: Secondary | ICD-10-CM | POA: Diagnosis not present

## 2020-08-20 DIAGNOSIS — Z8679 Personal history of other diseases of the circulatory system: Secondary | ICD-10-CM | POA: Diagnosis not present

## 2020-08-20 DIAGNOSIS — Z136 Encounter for screening for cardiovascular disorders: Secondary | ICD-10-CM

## 2020-08-20 DIAGNOSIS — R002 Palpitations: Secondary | ICD-10-CM

## 2020-08-20 DIAGNOSIS — E782 Mixed hyperlipidemia: Secondary | ICD-10-CM

## 2020-08-20 MED ORDER — CARVEDILOL 6.25 MG PO TABS
ORAL_TABLET | ORAL | 3 refills | Status: DC
Start: 2020-08-20 — End: 2021-08-29

## 2020-08-20 MED ORDER — FUROSEMIDE 20 MG PO TABS: 20.0000 mg | ORAL_TABLET | Freq: Every day | ORAL | 3 refills | Status: DC | PRN

## 2020-08-20 MED ORDER — HYDROCHLOROTHIAZIDE 25 MG PO TABS: 25.0000 mg | ORAL_TABLET | Freq: Every day | ORAL | 3 refills | Status: DC

## 2020-08-20 MED ORDER — ROSUVASTATIN CALCIUM 5 MG PO TABS: 5.0000 mg | ORAL_TABLET | Freq: Every day | ORAL | 3 refills | Status: DC

## 2020-08-20 MED ORDER — EZETIMIBE 10 MG PO TABS: 10.0000 mg | ORAL_TABLET | Freq: Every day | ORAL | 3 refills | Status: DC

## 2020-08-20 NOTE — Patient Instructions (Addendum)
Medication Instructions:  - Your physician has recommended you make the following change in your medication:   1) START Zetia 10 mg- take 1 tablet by mouth once daily  If you need a refill on your cardiac medications before your next appointment, please call your pharmacy.    Lab work: No new labs needed   If you have labs (blood work) drawn today and your tests are completely normal, you will receive your results only by: MyChart Message (if you have MyChart) OR A paper copy in the mail If you have any lab test that is abnormal or we need to change your treatment, we will call you to review the results.   Testing/Procedures:  - CT coronary calcium score (for family history)  $99 out of pocket at our [facility] in [place] Please call [phone] to schedule   [facility]  [address]  [place], [place] [address]    Follow-Up: At [facility], you and your health needs are our priority.  As part of our continuing mission to provide you with exceptional heart care, we have created designated Provider Care Teams.  These Care Teams include your primary Cardiologist (physician) and Advanced Practice Providers (APPs -  Physician Assistants and Nurse Practitioners) who all work together to provide you with the care you need, when you need it.  You will need a follow up appointment in 12 months  Providers on your designated Care Team:   Nicolasa Ducking, NP Eula Listen, PA-C Marisue Ivan, PA-C Cadence Fransico Michael, [place]  Any Other Special Instructions Will Be Listed Below (If Applicable).  COVID-19 Vaccine Information can be found at: [email or web address] For questions related to vaccine distribution or appointments, please email [email or web address] or call [phone].

## 2020-08-30 ENCOUNTER — Encounter: Payer: Self-pay | Admitting: Physical Therapy

## 2020-08-30 ENCOUNTER — Ambulatory Visit: Payer: BC Managed Care – PPO | Attending: Podiatry | Admitting: Physical Therapy

## 2020-08-30 ENCOUNTER — Other Ambulatory Visit: Payer: Self-pay

## 2020-08-30 DIAGNOSIS — R29898 Other symptoms and signs involving the musculoskeletal system: Secondary | ICD-10-CM | POA: Diagnosis not present

## 2020-08-30 DIAGNOSIS — S96919A Strain of unspecified muscle and tendon at ankle and foot level, unspecified foot, initial encounter: Secondary | ICD-10-CM | POA: Insufficient documentation

## 2020-08-30 DIAGNOSIS — S93409A Sprain of unspecified ligament of unspecified ankle, initial encounter: Secondary | ICD-10-CM | POA: Diagnosis not present

## 2020-08-30 DIAGNOSIS — M6281 Muscle weakness (generalized): Secondary | ICD-10-CM | POA: Insufficient documentation

## 2020-08-30 DIAGNOSIS — M25571 Pain in right ankle and joints of right foot: Secondary | ICD-10-CM | POA: Diagnosis not present

## 2020-08-30 NOTE — Therapy (Signed)
[facility] [address] [place], [place], [address] Phone: [phone]   Fax:  [phone]  Physical Therapy Treatment and Initial Evaluation 08/30/20  Patient Details  Name: Amanda Humphrey MRN: [id number] Date of Birth: 06-18-1965 Referring Provider (PT): Gwyneth Revels, [place]  Encounter Date: 08/30/2020   PT End of Session - 08/30/20 1205     Visit Number 1    Number of Visits 12    Date for PT Re-Evaluation 10/11/20    Authorization - Visit Number 1    Authorization - Number of Visits 10    PT Start Time 0733    PT Stop Time 0838    PT Time Calculation (min) 65 min    Activity Tolerance Patient tolerated treatment well;Patient limited by pain    Behavior During Therapy [facility] for tasks assessed/performed             Past Medical History:  Diagnosis Date   Allergy    Chronic kidney disease    stones   Fibrocystic breast disease 2013   Meniere disease    Migraines     Past Surgical History:  Procedure Laterality Date   ABDOMINAL HYSTERECTOMY  2011   laprascopic supracervical, [place]   APPENDECTOMY  1999   BREAST BIOPSY Right Nov 2012    fibrocystic disease, [place]   CESAREAN SECTION     4 total   CHOLECYSTECTOMY  2011   TONSILLECTOMY AND ADENOIDECTOMY  1975    There were no vitals filed for this visit.   Subjective Assessment - 08/30/20 1019     Subjective Pt states walking down her driveway and rolling her R ankle June 25th, 2022. Pt did not fall, and was able to return indoors. Pt stated increase in pain and decreased ability to walk the next morning, with severe increase in pain and swelling 2 days post MOI. Pt had a MRI that found near-to-complete tear of a ligament. Pt presents with a ankle brace with a history of CAM boot use. Pt states that she has had episodes of catching and locking of the ankle, which increase proximal lower leg pain.    Pertinent History MRI results: Rupture of R ant talofibular  ligament. History of severe neck pain that causes radiation to the R UE. Cervical Srugery C6-T1(early 2021). History of bilat plantar fasciitis and heel spurs. Pt's spouse has health complication resulting with increased home ADL reponsibilies. Pt enjoys visiting and walking on the beach, which she is currently unable to do at this time 2/2 ankle instability and pain.    Limitations Lifting;House hold activities    How long can you sit comfortably? WNL    How long can you stand comfortably? WNL with brace, fear of movement without brace.    How long can you walk comfortably? WNL with brace, fear of movement without brace.    Diagnostic tests MRI    Patient Stated Goals Decrease muscle spasms, return to work    Currently in Pain? No/denies    Pain Score 0-No pain    Pain Orientation Right;Lateral;Anterior    Pain Descriptors / Indicators Aching    Pain Radiating Towards proximal Medial/lateral lower leg    Pain Onset More than a month ago    Pain Frequency Intermittent    Aggravating Factors  Up and down steps (20 in the home), amb without the brace    Pain Relieving Factors Elevate ankle    Effect of Pain on Daily Activities Pt  unable to reduce ADLs due to caregiving needs for husband and highly active  management position at work    Multiple Pain Sites No    Pain Onset More than a month ago             SUBJECTIVE  Chief complaint: Pt states walking down her driveway and rolled her ankle July 21 2020. Pt did not fall, and was able to return indoors. Pt stated increase in pain and decreased ability to walk the next morning. Pt had a MRI that found complete tear of a ligament. Pt presents with a ankle brace with a history a CAM boat. Pt states that she has had episodes of catching and locking of the ankle. Pt state when this happens she has pain shoot up her lower leg.  History: MRI results: Rupture of R ant talofibular ligament. History of severe neck pain that causes radiation to the R  UE. Cervical Srugery C6-T1. History of bilat plantar fasciitis and heel spurs. Pt's spouse has health complication resulting with increased home ADL reponsibilies. Pt enjoys visiting and walking on the beach, which she is currently unable to do at this time 2/2 ankle instability and pain.  Referring Dx: Anterior talofibular ligament sprain.  Referring Provider:Justin Ether GriffinsFowler DPM  Pain location: General ankle: pain at the joint line, radiating up the lower leg, Lat ankle worse then medial Pain: Present 0/10, Best 0/10, Worst 7-810: Pain quality: pain quality: aching, burning, and sharp Radiating pain: Yes Towards the lower leg Numbness/Tingling: No 24 hour pain behavior: Activity dependent,  Aggravating factors: Up and down steps (two to get in, 20 steps in the house), Fearful to walk without brace, Walking without brace.   Easing factors: Elevate.  [facility]  How long can you stand:WFL with brace, fear of movement without brace How long [facility] you walk: [facility], fear of movement without brace History of back injury, pain, surgery, or therapy: Yes Cervical fusion (C6-T) early 2021   Follow-up appointment with MD:  Call if needed. Dominant hand: right Imaging: Yes  "FINDINGS: The Achilles tendon is intact. The plantar fascia is intact. There is a plantar calcaneal spur. There is a tear of the anterior talofibular ligament with a question of minimal intact fibers. The posterior talofibular ligament is intact. The calcaneofibular ligament is generally intact. Visualized deltoid ligament is generally intact. There is soft tissue edema of the subcutaneous tissues of the medial and lateral aspects of the right ankle. The peroneal tendons are intact. The medial ankle tendons are intact. Anterior ankle tendons are unremarkable"-referring note Falls in the last 6 months: No  Occupational demands: A lot of walking, uses a standing desk.  Hobbies: Primary caregiver of her husband,  enjoys Biomedical scientistcooking crafting, and walking the on the beach.  Goals: Pt unsure due to what is able to be fixed. Pt does not want to have surgery. Return to walking on the beach and work specific shoe wear. Red flags (bowel/bladder changes, saddle paresthesia, personal history of cancer, chills/fever, night sweats, unrelenting pain, first onset of insidious LBP <20 y/o) Negative    OBJECTIVE  Mental Status Patient is oriented to person, place and time.  Recent memory is intact.  Remote memory is intact.  Attention span and concentration are intact.  Expressive speech is intact.  Patient's fund of knowledge is within normal limits for educational level.  SENSATION: Grossly intact to light touch bilateral LEs as determined by testing dermatomes L2-S2 Proprioception and  hot/cold testing deferred on this date   MUSCULOSKELETAL:  Position: Pt prefers plantar flexion during resting, not weight bearing, posture.    Gait R antalgic gait pattern with decrease stance time on R compared to left.     Palpation TTP R ankle joint line, Severe tenderness at anterior talofibular ligament location and tarsal tunnel.     Strength (out of 5) R/L -4/-4 Hip flexion 5/5 Hip abduction 5/5 Hip adduction 4/4 Knee extension 4/4 Knee flexion 3/5 Ankle dorsiflexion  5/25 bilat Sl calf raise Ankle plantarflexion  *pain with all MMT testing of the ankle/foot* 3/5 ankle inversion  3/5 ankle eversion   *Pain when testing R ankle in all direction.    AROM (degrees) Hip/knee AROM WFL Ankle DF (0-35) L/R 0 Ankle PL (0-50) L/R 70 Ankle Inversion (0-35) L/R 30  Ankle Eversion: (0-35) L/R 30 Pain during R ankle AROM at end range. Movement slower R compared to left 2/2 pain.    PROM (degrees)  Same as AROM. No forceful OP of the R ankle.    Edema circumference.  R 55.5 cm L 58Cm    STS: No change in ankle joint symmtry on drop in arch when compared weight bearing to NWB loading.     SPECIAL  TESTS No indicated at this time due to MRI findings and sharp increase in pain with movement.      ASSESSMENT  Clinical Impression: Pt is a pleasant 55 year-old female referred for anterior talofibular sprain/tear 07/21/20 see MRI. PT examination reveals deficits including: Decrease R ankle strength, low FOTO score, Severe pain with work/home/community ADLs . Pt will benefit from skilled PT services to address deficits and return to pain-free function at home and work. Pt will benefit from skilled POC of 2x week for 6 weeks. Pt cases presents as moderate complexity due to time from injury, health status, and age. Pt displays good prognosis with return to PLOF.          PT Education - 08/30/20 0849     Education Details Pt ed was provided on MOI and pain science with  HEP to optimal return to PLOF [id number]    Person(s) Educated Patient    Methods Explanation;Demonstration;Tactile cues;Verbal cues;Handout    Comprehension Verbalized understanding;Need further instruction                 PT Long Term Goals - 08/30/20 1207       PT LONG TERM GOAL #1   Title Pt. will improve FOTO score to >62 to improve independence with ADLs    Baseline 42    Period Weeks    Status New    Target Date 10/11/20      PT LONG TERM GOAL #2   Title Pt will improve R ankle MMT to qual to L to faciliate increased stability for return to walks on the beach.    Baseline R/L: 3/5 Ankle dorsiflexion    5/25 bilat Sl calf raise Ankle plantarflexion  *pain with all MMT testing of the ankle/foot*  3/5 ankle inversion   3/5 ankle eversion    Time 6    Period Weeks    Status New    Target Date 10/11/20      PT LONG TERM GOAL #3   Title Pt will be able to complete 20 steps with R ankle pain >3/10 to facilitate greater mobility within the home.    Baseline 7-8/10 NPS    Time 6    Period Weeks  Status New    Target Date 10/11/20      PT LONG TERM GOAL #4   Title Pt will be able to complete  work/home mobility taks with laced ankle braced doff >50% for return to work specific foot wear.    Baseline Pt current needs to wear high cushion tennis shoes to work, brace donned 100%    Time 6    Period Weeks    Status New    Target Date 10/11/20      PT LONG TERM GOAL #5   Title --    Baseline --    Time --    Period --    Status --                   Plan - 08/30/20 1225     Clinical Impression Statement Pt is a pleasant 55 year-old female referred for anterior talofibular sprain/tear 07/21/20 (see MRI).  PT examination reveals deficits including: Decrease R ankle strength, low FOTO score, Severe pain with work/home/community ADLs . Pt will benefit from skilled PT services to address deficits and return to pain-free function at home and work. Pt will benefit from skilled POC of 2x week for 6 weeks. Pt. presents as moderate complexity due to time from injury, health status, and age. Pt displays good prognosis with return to PLOF.    Personal Factors and Comorbidities Comorbidity 2;Profession;Past/Current Experience    Comorbidities Hypertension, Obesity    Examination-Activity Limitations Squat;Locomotion Level    Examination-Participation Restrictions Occupation;Cleaning;Laundry    Stability/Clinical Decision Making Evolving/Moderate complexity    Clinical Decision Making Moderate    Rehab Potential Good    PT Frequency 2x / week    PT Duration 6 weeks    PT Treatment/Interventions ADLs/Self Care Home Management;Aquatic Therapy;Biofeedback;Cryotherapy;Electrical Stimulation;Moist Heat;Ultrasound;Functional mobility training;Therapeutic activities;Therapeutic exercise;Balance training;Neuromuscular re-education;Patient/family education;Manual techniques;Scar mobilization;Passive range of motion;Dry needling;Energy conservation;Taping;Gait training;Stair training;DME Instruction;Spinal Manipulations;Joint Manipulations    PT Next Visit Plan Reassess adherence to HEP.    PT  Home Exercise Plan [id number]    Recommended Other Services Not at this time.    Consulted and Agree with Plan of Care Patient             Patient will benefit from skilled therapeutic intervention in order to improve the following deficits and impairments:  Decreased mobility, Decreased endurance, Hypomobility, Decreased range of motion, Decreased scar mobility, Improper body mechanics, Postural dysfunction, Impaired flexibility, Decreased strength, Decreased activity tolerance, Abnormal gait, Decreased balance, Difficulty walking, Increased edema, Impaired perceived functional ability  Visit Diagnosis: Sprain and strain of ankle  Pain in right ankle and joints of right foot  Ankle weakness     Problem List Patient Active Problem List   Diagnosis Date Noted   H/O of hemilaminectomy 02/15/2019   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   Fatigue 09/23/2017   History of pneumonia 04/30/2016   Grief reaction 04/30/2016   Vitamin D deficiency 02/22/2015   Hyperlipidemia 01/01/2015   Visit for preventive health examination 12/04/2013   Lower extremity edema 09/02/2013   Essential hypertension 09/02/2013   History of cardiomyopathy 09/02/2013   SVT (supraventricular tachycardia) (HCC) 09/02/2013   Palpitations 09/02/2013   Contrast media allergy 03/10/2013   Fibrocystic breast disease    Morbid obesity (HCC) 02/05/2012   Meniere disease    Cammie Mcgee, PT, DPT # [id number] Lawernce Ion SPT 08/30/2020, 4:50 PM  [facility] [address] [place], [place], [address]  Phone: [phone]   Fax:  [phone]  Name: ANNACLAIRE WALSWORTH MRN: [id number] Date of Birth: 10/18/1965

## 2020-09-04 ENCOUNTER — Other Ambulatory Visit: Payer: Self-pay

## 2020-09-04 ENCOUNTER — Ambulatory Visit: Payer: BC Managed Care – PPO | Admitting: Physical Therapy

## 2020-09-04 ENCOUNTER — Encounter: Payer: Self-pay | Admitting: Physical Therapy

## 2020-09-04 DIAGNOSIS — S96919A Strain of unspecified muscle and tendon at ankle and foot level, unspecified foot, initial encounter: Secondary | ICD-10-CM

## 2020-09-04 DIAGNOSIS — R29898 Other symptoms and signs involving the musculoskeletal system: Secondary | ICD-10-CM | POA: Diagnosis not present

## 2020-09-04 DIAGNOSIS — S93409A Sprain of unspecified ligament of unspecified ankle, initial encounter: Secondary | ICD-10-CM

## 2020-09-04 DIAGNOSIS — M25571 Pain in right ankle and joints of right foot: Secondary | ICD-10-CM | POA: Diagnosis not present

## 2020-09-04 DIAGNOSIS — M6281 Muscle weakness (generalized): Secondary | ICD-10-CM | POA: Diagnosis not present

## 2020-09-04 NOTE — Therapy (Signed)
[facility] [address] [place], [place], [address] Phone: [phone]   Fax:  [phone]  Physical Therapy Treatment  Patient Details  Name: Amanda Humphrey MRN: [id number] Date of Birth: Oct 15, 1965 Referring Provider (PT): Gwyneth Revels, [place]   Encounter Date: 09/04/2020   PT End of Session - 09/04/20 0824     Visit Number 2    Number of Visits 12    Date for PT Re-Evaluation 10/11/20    Authorization - Visit Number 2    Authorization - Number of Visits 10    PT Start Time 0734    PT Stop Time 0816    PT Time Calculation (min) 42 min    Activity Tolerance Patient tolerated treatment well;Patient limited by pain    Behavior During Therapy [facility] for tasks assessed/performed             Past Medical History:  Diagnosis Date   Allergy    Chronic kidney disease    stones   Fibrocystic breast disease 2013   Meniere disease    Migraines     Past Surgical History:  Procedure Laterality Date   ABDOMINAL HYSTERECTOMY  2011   laprascopic supracervical, [place]   APPENDECTOMY  1999   BREAST BIOPSY Right Nov 2012    fibrocystic disease, [place]   CESAREAN SECTION     4 total   CHOLECYSTECTOMY  2011   TONSILLECTOMY AND ADENOIDECTOMY  1975    There were no vitals filed for this visit.   Subjective Assessment - 09/04/20 0821     Subjective Pt presents to tx with 1/10 pain at the start of tx. Pt states increase in pain and swelling (7/10) during HEP adherence yesterday. Pt stated that she heard a pop in ankle which caused the pain. Pt did not complete HEP this morning before tx. Pt amb into clinic with laced ankle brace.    Pertinent History MRI results: Rupture of R ant talofibular ligament. History of severe neck pain that causes radiation to the R UE. Cervical Srugery C6-T1(early 2021). History of bilat plantar fasciitis and heel spurs. Pt's spouse has health complication resulting with increased home ADL reponsibilies. Pt  enjoys visiting and walking on the beach, which she is currently unable to do at this time 2/2 ankle instability and pain.    Limitations Lifting;House hold activities    How long can you sit comfortably? WNL    How long can you stand comfortably? WNL with brace, fear of movement without brace.    How long can you walk comfortably? WNL with brace, fear of movement without brace.    Diagnostic tests MRI    Patient Stated Goals Decrease muscle spasms, return to work    Pain Score 1     Pain Location Ankle    Pain Orientation Right;Anterior;Lateral    Pain Descriptors / Indicators Aching    Pain Onset More than a month ago    Pain Onset More than a month ago              Reassessment:   Palpation: non-pitting edema and TTP ant-lat R ankle, DF/toe ext tendon bundle.   Treatment:   Manual Therapy:  STM with lotion to lat/ant R ankle to promote reduction in pain and edema. Depth of massage with light-to-mod to facilitate desensitizing of the ankle. of 16 mins.    Therapeutic Exercise:  1) Ankle pumps x30 at the start of tx to facilitate warming of the  talocrural joint and surround tissues   2) Seated BAPS board PF-DF, D1, and D2 patterns. Each direction x30. Verbal and contact cueing at the BAPs board and knee joint for isolated ankle movements.   3) Seated ankle ABCs x1 on 6# weight ball for more stability. Verbal and contact cueing needed to ensure optimal mechanics to proper tissue loading.         PT Education - 09/04/20 0175     Education Details Pt ed was provided on continued motion during the day to reduce swelling and pain in the R ankle.    Person(s) Educated Patient    Methods Explanation;Demonstration;Tactile cues    Comprehension Verbalized understanding                 PT Long Term Goals - 08/30/20 1207       PT LONG TERM GOAL #1   Title Pt. will improve FOTO score to >62 to improve independence with ADLs    Baseline 42    Period Weeks     Status New    Target Date 10/11/20      PT LONG TERM GOAL #2   Title Pt will improve R ankle MMT to qual to L to faciliate increased stability for return to walks on the beach.    Baseline R/L: 3/5 Ankle dorsiflexion    5/25 bilat Sl calf raise Ankle plantarflexion  *pain with all MMT testing of the ankle/foot*  3/5 ankle inversion   3/5 ankle eversion    Time 6    Period Weeks    Status New    Target Date 10/11/20      PT LONG TERM GOAL #3   Title Pt will be able to complete 20 steps with R ankle pain >3/10 to facilitate greater mobility within the home.    Baseline 7-8/10 NPS    Time 6    Period Weeks    Status New    Target Date 10/11/20      PT LONG TERM GOAL #4   Title Pt will be able to complete work/home mobility taks with laced ankle braced doff >50% for return to work specific foot wear.    Baseline Pt current needs to wear high cushion tennis shoes to work, brace donned 100%    Time 6    Period Weeks    Status New    Target Date 10/11/20      PT LONG TERM GOAL #5   Title --    Baseline --    Time --    Period --    Status --                 Plan - 09/04/20 1025     Clinical Impression Statement Pt displayed moderate tolerance to tx with addition of seated therex. Pt completed tx with an increase in ankle pain to 4/10. Pt travels to work after tx and unable to ice after tx. Future tx should include ice for swelling management at the end of tx. Palpation and movement does not lead to suspected further injury of the ankle after pt reported "pop" during HEP. Pt continues to display ankle strength, endurance and stability deficits resulting in increased pain and reliance of laced ankle brace. Pt. will continue to benefit from skilled physical therapy to progress POC to address remaining deficits to facilitate maximum functional capacity for optimal personal health and wellness for ADLs.    Personal Factors and Comorbidities Comorbidity 2;Profession;Past/Current  Experience  Comorbidities Hypertension, Obesity    Examination-Activity Limitations Squat;Locomotion Level    Examination-Participation Restrictions Occupation;Cleaning;Laundry    Stability/Clinical Decision Making Evolving/Moderate complexity    Clinical Decision Making Moderate    Rehab Potential Good    PT Frequency 2x / week    PT Duration 6 weeks    PT Treatment/Interventions ADLs/Self Care Home Management;Aquatic Therapy;Biofeedback;Cryotherapy;Electrical Stimulation;Moist Heat;Ultrasound;Functional mobility training;Therapeutic activities;Therapeutic exercise;Balance training;Neuromuscular re-education;Patient/family education;Manual techniques;Scar mobilization;Passive range of motion;Dry needling;Energy conservation;Taping;Gait training;Stair training;DME Instruction;Spinal Manipulations;Joint Manipulations    PT Next Visit Plan start isometric holds in a pain limiting ranges.    PT Home Exercise Plan [id number]    Consulted and Agree with Plan of Care Patient             Patient will benefit from skilled therapeutic intervention in order to improve the following deficits and impairments:  Decreased mobility, Decreased endurance, Hypomobility, Decreased range of motion, Decreased scar mobility, Improper body mechanics, Postural dysfunction, Impaired flexibility, Decreased strength, Decreased activity tolerance, Abnormal gait, Decreased balance, Difficulty walking, Increased edema, Impaired perceived functional ability  Visit Diagnosis: Sprain and strain of ankle  Pain in right ankle and joints of right foot  Ankle weakness     Problem List Patient Active Problem List   Diagnosis Date Noted   H/O of hemilaminectomy 02/15/2019   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   Fatigue 09/23/2017   History of pneumonia 04/30/2016   Grief reaction 04/30/2016   Vitamin D deficiency 02/22/2015   Hyperlipidemia 01/01/2015   Visit for preventive health examination  12/04/2013   Lower extremity edema 09/02/2013   Essential hypertension 09/02/2013   History of cardiomyopathy 09/02/2013   SVT (supraventricular tachycardia) (HCC) 09/02/2013   Palpitations 09/02/2013   Contrast media allergy 03/10/2013   Fibrocystic breast disease    Morbid obesity (HCC) 02/05/2012   Meniere disease     Cammie Mcgee, PT, DPT # [id number] Lawernce Ion SPT 09/04/2020, 9:23 AM  [facility] [address] [place], [place], [address] Phone: [phone]   Fax:  [phone]  Name: SAIDY ORMAND MRN: [id number] Date of Birth: 10-15-1965

## 2020-09-06 ENCOUNTER — Encounter: Payer: Self-pay | Admitting: Physical Therapy

## 2020-09-06 ENCOUNTER — Ambulatory Visit: Payer: BC Managed Care – PPO | Admitting: Physical Therapy

## 2020-09-06 ENCOUNTER — Other Ambulatory Visit: Payer: Self-pay

## 2020-09-06 DIAGNOSIS — M25571 Pain in right ankle and joints of right foot: Secondary | ICD-10-CM | POA: Diagnosis not present

## 2020-09-06 DIAGNOSIS — M6281 Muscle weakness (generalized): Secondary | ICD-10-CM | POA: Diagnosis not present

## 2020-09-06 DIAGNOSIS — R29898 Other symptoms and signs involving the musculoskeletal system: Secondary | ICD-10-CM | POA: Diagnosis not present

## 2020-09-06 DIAGNOSIS — S93409A Sprain of unspecified ligament of unspecified ankle, initial encounter: Secondary | ICD-10-CM | POA: Diagnosis not present

## 2020-09-06 DIAGNOSIS — S96919A Strain of unspecified muscle and tendon at ankle and foot level, unspecified foot, initial encounter: Secondary | ICD-10-CM | POA: Diagnosis not present

## 2020-09-06 NOTE — Therapy (Signed)
[place] [address] [place], [place], [address] Phone: [phone]   Fax:  [phone]  Physical Therapy Treatment  Patient Details  Name: Amanda Humphrey MRN: [id number] Date of Birth: 1966/01/10 Referring Provider (PT): Gwyneth Revels, [place]   Encounter Date: 09/06/2020   PT End of Session - 09/06/20 0833     Visit Number 3    Number of Visits 12    Date for PT Re-Evaluation 10/11/20    Authorization - Visit Number 3    Authorization - Number of Visits 10    PT Start Time 0736    PT Stop Time 0828    PT Time Calculation (min) 52 min    Activity Tolerance Patient tolerated treatment well;Patient limited by pain    Behavior During Therapy [facility] for tasks assessed/performed             Past Medical History:  Diagnosis Date   Allergy    Chronic kidney disease    stones   Fibrocystic breast disease 2013   Meniere disease    Migraines     Past Surgical History:  Procedure Laterality Date   ABDOMINAL HYSTERECTOMY  2011   laprascopic supracervical, [place]   APPENDECTOMY  1999   BREAST BIOPSY Right Nov 2012    fibrocystic disease, [place]   CESAREAN SECTION     4 total   CHOLECYSTECTOMY  2011   TONSILLECTOMY AND ADENOIDECTOMY  1975    There were no vitals filed for this visit.   Subjective Assessment - 09/06/20 0734     Subjective Pt presents to tx without ankle brace. Pt states increase in pain on the medial side R ankle. Pt states good adherence to HEP with caution of movement. Pain at the start of tx is 2-3/10 with increase ache.    Pertinent History MRI results: Rupture of R ant talofibular ligament. History of severe neck pain that causes radiation to the R UE. Cervical Srugery C6-T1(early 2021). History of bilat plantar fasciitis and heel spurs. Pt's spouse has health complication resulting with increased home ADL reponsibilies. Pt enjoys visiting and walking on the beach, which she is currently unable to do  at this time 2/2 ankle instability and pain.    Limitations Lifting;House hold activities    How long can you sit comfortably? WNL    How long can you stand comfortably? WNL with brace, fear of movement without brace.    How long can you walk comfortably? WNL with brace, fear of movement without brace.    Diagnostic tests MRI    Patient Stated Goals Decrease muscle spasms, return to work    Currently in Pain? Yes    Pain Score 3     Pain Location Ankle    Pain Orientation Right;Anterior;Lateral    Pain Descriptors / Indicators Aching    Pain Onset More than a month ago    Pain Onset More than a month ago               Medail ankle Assessment:   -Severe TTP medial tibia 3-5 cm above medial malleolus -Min pitting edema long the medial tibia  -negative pain provocation during tuning fork along medial tibia   Manual Therapy:   Manual STM with mild-mod tissue pressure to facilitate reduction of swelling in the medial/lateral ankle. Promote decrease medial/lateral ankle sensitivity.  12 mins   Therapeutic Exercise:   1)Weight shifting RLE on airex pad. Shoe donned without ankle brace. X20  2) bilat concentric heel raise to R unilat eccentric phase. Verbal cueing to complete through a pain limiting range (~50%). Shoe donned without ankle brace. x10  3) // bar amb x4 laps with occ touch UE assit. Verbal cueing for normalized gait pattern and proper turning sequence to protect the R ankle. Shoe donned without ankle brace.       *10 min wrap iced pack R ankle with pillow case to prevent direct skin contact* NOT BILLED*       PT Education - 09/06/20 2440     Education Details Pt. educated on proper stepping and turning mechanics to protect the ankle without the brace on.    Person(s) Educated Patient    Methods Explanation;Demonstration;Verbal cues    Comprehension Verbalized understanding;Need further instruction                 PT Long Term Goals -  08/30/20 1207       PT LONG TERM GOAL #1   Title Pt. will improve FOTO score to >62 to improve independence with ADLs    Baseline 42    Period Weeks    Status New    Target Date 10/11/20      PT LONG TERM GOAL #2   Title Pt will improve R ankle MMT to qual to L to faciliate increased stability for return to walks on the beach.    Baseline R/L: 3/5 Ankle dorsiflexion    5/25 bilat Sl calf raise Ankle plantarflexion  *pain with all MMT testing of the ankle/foot*  3/5 ankle inversion   3/5 ankle eversion    Time 6    Period Weeks    Status New    Target Date 10/11/20      PT LONG TERM GOAL #3   Title Pt will be able to complete 20 steps with R ankle pain >3/10 to facilitate greater mobility within the home.    Baseline 7-8/10 NPS    Time 6    Period Weeks    Status New    Target Date 10/11/20      PT LONG TERM GOAL #4   Title Pt will be able to complete work/home mobility taks with laced ankle braced doff >50% for return to work specific foot wear.    Baseline Pt current needs to wear high cushion tennis shoes to work, brace donned 100%    Time 6    Period Weeks    Status New    Target Date 10/11/20      PT LONG TERM GOAL #5   Title --    Baseline --    Time --    Period --    Status --                   Plan - 09/06/20 0834     Clinical Impression Statement Tx focused on promoting ankle stability during standing posture with shoe donned without brace. Pt presented to the clinic with increase tenderness during WB at the medial tibial 3-5cm above the malleoli. Assessment:  Severe TTP medial tibia 3-5 cm above medial malleolus. Min pitting edema along the medial tibia and negative pain provocation during tuning fork along medial tibia. Exam does not warrant a referral at this time due to pain and edema at baseline. Medial tibial pain will be monitored through POC. Pt stated increase in pain to 6/10 post tx.   Rapid increase in pain provides limited activity tolerance  during tx.  Pt continues to display poor R ankle strength, endurance, and stability which results in high levels of pain.  Pt. will continue to benefit from skilled physical therapy to progress POC to address remaining deficits to facilitate maximum functional capacity for optimal personal health and wellness for ADLs.    Personal Factors and Comorbidities Comorbidity 2;Profession;Past/Current Experience    Comorbidities Hypertension, Obesity    Examination-Activity Limitations Squat;Locomotion Level    Examination-Participation Restrictions Occupation;Cleaning;Laundry    Stability/Clinical Decision Making Evolving/Moderate complexity    Clinical Decision Making Moderate    Rehab Potential Good    PT Frequency 2x / week    PT Duration 6 weeks    PT Treatment/Interventions ADLs/Self Care Home Management;Aquatic Therapy;Biofeedback;Cryotherapy;Electrical Stimulation;Moist Heat;Ultrasound;Functional mobility training;Therapeutic activities;Therapeutic exercise;Balance training;Neuromuscular re-education;Patient/family education;Manual techniques;Scar mobilization;Passive range of motion;Dry needling;Energy conservation;Taping;Gait training;Stair training;DME Instruction;Spinal Manipulations;Joint Manipulations    PT Next Visit Plan Start isometric holds in a pain limited range    PT Home Exercise Plan [id number]    Consulted and Agree with Plan of Care Patient             Patient will benefit from skilled therapeutic intervention in order to improve the following deficits and impairments:  Decreased mobility, Decreased endurance, Hypomobility, Decreased range of motion, Decreased scar mobility, Improper body mechanics, Postural dysfunction, Impaired flexibility, Decreased strength, Decreased activity tolerance, Abnormal gait, Decreased balance, Difficulty walking, Increased edema, Impaired perceived functional ability  Visit Diagnosis: Sprain and strain of ankle  Pain in right ankle and joints  of right foot  Ankle weakness     Problem List Patient Active Problem List   Diagnosis Date Noted   H/O of hemilaminectomy 02/15/2019   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   Fatigue 09/23/2017   History of pneumonia 04/30/2016   Grief reaction 04/30/2016   Vitamin D deficiency 02/22/2015   Hyperlipidemia 01/01/2015   Visit for preventive health examination 12/04/2013   Lower extremity edema 09/02/2013   Essential hypertension 09/02/2013   History of cardiomyopathy 09/02/2013   SVT (supraventricular tachycardia) (HCC) 09/02/2013   Palpitations 09/02/2013   Contrast media allergy 03/10/2013   Fibrocystic breast disease    Morbid obesity (HCC) 02/05/2012   Meniere disease    Cammie Mcgee, PT, DPT # [id number] Lawernce Ion SPT 09/06/2020, 3:35 PM  [place] [address] [place], [place], [address] Phone: [phone]   Fax:  [phone]  Name: Amanda Humphrey MRN: [id number] Date of Birth: 1965-10-21

## 2020-09-11 ENCOUNTER — Other Ambulatory Visit: Payer: Self-pay

## 2020-09-11 ENCOUNTER — Encounter: Payer: Self-pay | Admitting: Physical Therapy

## 2020-09-11 ENCOUNTER — Ambulatory Visit: Payer: BC Managed Care – PPO | Admitting: Physical Therapy

## 2020-09-11 DIAGNOSIS — M6281 Muscle weakness (generalized): Secondary | ICD-10-CM | POA: Diagnosis not present

## 2020-09-11 DIAGNOSIS — S93409A Sprain of unspecified ligament of unspecified ankle, initial encounter: Secondary | ICD-10-CM

## 2020-09-11 DIAGNOSIS — R29898 Other symptoms and signs involving the musculoskeletal system: Secondary | ICD-10-CM | POA: Diagnosis not present

## 2020-09-11 DIAGNOSIS — M25571 Pain in right ankle and joints of right foot: Secondary | ICD-10-CM

## 2020-09-11 DIAGNOSIS — S96919A Strain of unspecified muscle and tendon at ankle and foot level, unspecified foot, initial encounter: Secondary | ICD-10-CM | POA: Diagnosis not present

## 2020-09-11 NOTE — Therapy (Signed)
[place] [facility] [address] [place], [place], [address] Phone: [phone]   Fax:  [phone]  Physical Therapy Treatment  Patient Details  Name: Amanda Humphrey MRN: [id number] Date of Birth: 01/06/1966 Referring Provider (PT): Gwyneth Revels, [place]   Encounter Date: 09/11/2020   PT End of Session - 09/11/20 0837     Visit Number 4    Number of Visits 12    Date for PT Re-Evaluation 10/11/20    Authorization - Visit Number 4    Authorization - Number of Visits 10    PT Start Time 0732    PT Stop Time 0819    PT Time Calculation (min) 47 min    Activity Tolerance Patient tolerated treatment well;Patient limited by pain    Behavior During Therapy [facility] for tasks assessed/performed             Past Medical History:  Diagnosis Date   Allergy    Chronic kidney disease    stones   Fibrocystic breast disease 2013   Meniere disease    Migraines     Past Surgical History:  Procedure Laterality Date   ABDOMINAL HYSTERECTOMY  2011   laprascopic supracervical, [place]   APPENDECTOMY  1999   BREAST BIOPSY Right Nov 2012    fibrocystic disease, [place]   CESAREAN SECTION     4 total   CHOLECYSTECTOMY  2011   TONSILLECTOMY AND ADENOIDECTOMY  1975    There were no vitals filed for this visit.   Subjective Assessment - 09/11/20 0802     Subjective Pt presents to tx with 4/10 pain at the start of tx. Pt stated poor adherence to sleep last night due to increase in L hip pain and long day at work. Pt worked a long day yesterday with increased time on feet.    Pertinent History MRI results: Rupture of R ant talofibular ligament. History of severe neck pain that causes radiation to the R UE. Cervical Srugery C6-T1(early 2021). History of bilat plantar fasciitis and heel spurs. Pt's spouse has health complication resulting with increased home ADL reponsibilies. Pt enjoys visiting and walking on the beach, which she is currently unable to  do at this time 2/2 ankle instability and pain.    Limitations Lifting;House hold activities    How long can you sit comfortably? WNL    How long can you stand comfortably? WNL with brace, fear of movement without brace.    How long can you walk comfortably? WNL with brace, fear of movement without brace.    Diagnostic tests MRI    Patient Stated Goals Decrease muscle spasms, return to work    Currently in Pain? Yes    Pain Score 4     Pain Location Ankle    Pain Orientation Right;Anterior;Medial;Lateral    Pain Descriptors / Indicators Aching    Pain Type Chronic pain;Surgical pain    Pain Onset More than a month ago    Pain Onset More than a month ago                 Manual Therapy:     Manual STM with mild-mod tissue pressure to facilitate reduction of swelling in the medial/lateral ankle. Promote decrease medial/lateral ankle sensitivity.  13 mins     Therapeutic Exercise:   1) R ankle isometric holds in a everted position. Mild-moderated therapist resistance. 5 sec x10   2)  BAPS board with shoe doffed. X30 sagittal and  frontal plane mobility. Pt stated increased cognitive effort to undergo frontal plane movement without knee/hip involvement.   3) Amb without braced 300 ft. Verbal cueing for correct heel-toe gait pattern with equal WB. Pt presents with narrow-based gait without verbal cueing. Verbal cueing was used to widen gait, however, this caused increase in lateral ankle pain.       *10 min wrap iced pack R ankle with pillow case to prevent direct skin contact* NOT BILLED*       PT Long Term Goals - 08/30/20 1207       PT LONG TERM GOAL #1   Title Pt. will improve FOTO score to >62 to improve independence with ADLs    Baseline 42    Period Weeks    Status New    Target Date 10/11/20      PT LONG TERM GOAL #2   Title Pt will improve R ankle MMT to qual to L to faciliate increased stability for return to walks on the beach.    Baseline R/L: 3/5  Ankle dorsiflexion    5/25 bilat Sl calf raise Ankle plantarflexion  *pain with all MMT testing of the ankle/foot*  3/5 ankle inversion   3/5 ankle eversion    Time 6    Period Weeks    Status New    Target Date 10/11/20      PT LONG TERM GOAL #3   Title Pt will be able to complete 20 steps with R ankle pain >3/10 to facilitate greater mobility within the home.    Baseline 7-8/10 NPS    Time 6    Period Weeks    Status New    Target Date 10/11/20      PT LONG TERM GOAL #4   Title Pt will be able to complete work/home mobility taks with laced ankle braced doff >50% for return to work specific foot wear.    Baseline Pt current needs to wear high cushion tennis shoes to work, brace donned 100%    Time 6    Period Weeks    Status New    Target Date 10/11/20      PT LONG TERM GOAL #5   Title --    Baseline --    Time --    Period --    Status --                   Plan - 09/11/20 5056     Clinical Impression Statement Tx focus was on isometric strengthening to promote increased peroneus strengthening for R ankle stability. Pt reported increase in pain after isometric strengthening and BAPS board. Pt stated that Ice assisted to decrease pain at the end of tx. Pt continues to display poor R ankle stability and strength that is limited by pain and swelling. Pt. will continue to benefit from skilled physical therapy to progress POC to address remaining deficits to facilitate maximum functional capacity for optimal personal health and wellness for ADLs.    Personal Factors and Comorbidities Comorbidity 2;Profession;Past/Current Experience    Comorbidities Hypertension, Obesity    Examination-Activity Limitations Squat;Locomotion Level    Examination-Participation Restrictions Occupation;Cleaning;Laundry    Stability/Clinical Decision Making Evolving/Moderate complexity    Clinical Decision Making Moderate    Rehab Potential Good    PT Frequency 2x / week    PT Duration 6 weeks     PT Treatment/Interventions ADLs/Self Care Home Management;Aquatic Therapy;Biofeedback;Cryotherapy;Electrical Stimulation;Moist Heat;Ultrasound;Functional mobility training;Therapeutic activities;Therapeutic exercise;Balance training;Neuromuscular re-education;Patient/family education;Manual techniques;Scar  mobilization;Passive range of motion;Dry needling;Energy conservation;Taping;Gait training;Stair training;DME Instruction;Spinal Manipulations;Joint Manipulations    PT Next Visit Plan Assess R cuboid pain and mobility.    PT Home Exercise Plan [id number]    Consulted and Agree with Plan of Care Patient             Patient will benefit from skilled therapeutic intervention in order to improve the following deficits and impairments:  Decreased mobility, Decreased endurance, Hypomobility, Decreased range of motion, Decreased scar mobility, Improper body mechanics, Postural dysfunction, Impaired flexibility, Decreased strength, Decreased activity tolerance, Abnormal gait, Decreased balance, Difficulty walking, Increased edema, Impaired perceived functional ability  Visit Diagnosis: Sprain and strain of ankle  Pain in right ankle and joints of right foot  Ankle weakness     Problem List Patient Active Problem List   Diagnosis Date Noted   H/O of hemilaminectomy 02/15/2019   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   Fatigue 09/23/2017   History of pneumonia 04/30/2016   Grief reaction 04/30/2016   Vitamin D deficiency 02/22/2015   Hyperlipidemia 01/01/2015   Visit for preventive health examination 12/04/2013   Lower extremity edema 09/02/2013   Essential hypertension 09/02/2013   History of cardiomyopathy 09/02/2013   SVT (supraventricular tachycardia) (HCC) 09/02/2013   Palpitations 09/02/2013   Contrast media allergy 03/10/2013   Fibrocystic breast disease    Morbid obesity (HCC) 02/05/2012   Meniere disease    Cammie Mcgee, PT, DPT # [id number] Lawernce Ion  SPT 09/11/2020, 9:43 AM  [facility] [address] [place], [place], [address] Phone: [phone]   Fax:  [phone]  Name: Amanda Humphrey MRN: [id number] Date of Birth: 05-23-1965

## 2020-09-13 ENCOUNTER — Other Ambulatory Visit: Payer: Self-pay

## 2020-09-13 ENCOUNTER — Encounter: Payer: Self-pay | Admitting: Physical Therapy

## 2020-09-13 ENCOUNTER — Ambulatory Visit
Admission: RE | Admit: 2020-09-13 | Discharge: 2020-09-13 | Disposition: A | Discharge status: Home / Self Care | Payer: BC Managed Care – PPO | Source: Ambulatory Visit | Attending: Cardiovascular Disease | Admitting: Cardiovascular Disease

## 2020-09-13 ENCOUNTER — Ambulatory Visit: Payer: BC Managed Care – PPO | Admitting: Physical Therapy

## 2020-09-13 DIAGNOSIS — S93409A Sprain of unspecified ligament of unspecified ankle, initial encounter: Secondary | ICD-10-CM

## 2020-09-13 DIAGNOSIS — M25571 Pain in right ankle and joints of right foot: Secondary | ICD-10-CM | POA: Diagnosis not present

## 2020-09-13 DIAGNOSIS — S96919A Strain of unspecified muscle and tendon at ankle and foot level, unspecified foot, initial encounter: Secondary | ICD-10-CM | POA: Diagnosis not present

## 2020-09-13 DIAGNOSIS — Z136 Encounter for screening for cardiovascular disorders: Secondary | ICD-10-CM | POA: Insufficient documentation

## 2020-09-13 DIAGNOSIS — M6281 Muscle weakness (generalized): Secondary | ICD-10-CM | POA: Diagnosis not present

## 2020-09-13 DIAGNOSIS — R29898 Other symptoms and signs involving the musculoskeletal system: Secondary | ICD-10-CM

## 2020-09-13 NOTE — Therapy (Signed)
[facility] [address]. [place], [place], [address] Phone: [phone]   Fax:  [phone]  Physical Therapy Treatment  Patient Details  Name: Amanda Humphrey MRN: [id number] Date of Birth: [birth year] Referring Provider (PT): Gwyneth Revels, [place]   Encounter Date: 09/13/2020   PT End of Session - 09/13/20 0832     Visit Number 5    Number of Visits 12    Date for PT Re-Evaluation 10/11/20    Authorization - Visit Number 5    Authorization - Number of Visits 10    PT Start Time 0734    PT Stop Time 0822    PT Time Calculation (min) 48 min    Activity Tolerance Patient tolerated treatment well;Patient limited by pain    Behavior During Therapy [facility] for tasks assessed/performed             Past Medical History:  Diagnosis Date   Allergy    Chronic kidney disease    stones   Fibrocystic breast disease 2013   Meniere disease    Migraines     Past Surgical History:  Procedure Laterality Date   ABDOMINAL HYSTERECTOMY  2011   laprascopic supracervical, [place]   APPENDECTOMY  1999   BREAST BIOPSY Right Nov 2012    fibrocystic disease, [place]   CESAREAN SECTION     4 total   CHOLECYSTECTOMY  2011   TONSILLECTOMY AND ADENOIDECTOMY  1975    There were no vitals filed for this visit.   Subjective Assessment - 09/13/20 0735     Subjective Pt presents to tx with 4/10 R ankle pain at the start of tx. Pt stated that she was sore after last tx that remained elevated for the rest of day. Pt stated not wearing her laced braced yesterday and believes that it caused an increase in swelling.    Pertinent History MRI results: Rupture of R ant talofibular ligament. History of severe neck pain that causes radiation to the R UE. Cervical Srugery C6-T1(early 2021). History of bilat plantar fasciitis and heel spurs. Pt's spouse has health complication resulting with increased home ADL reponsibilies. Pt enjoys visiting and walking on the  beach, which she is currently unable to do at this time 2/2 ankle instability and pain.    Limitations Lifting;House hold activities    How long can you sit comfortably? WNL    How long can you stand comfortably? WNL with brace, fear of movement without brace.    How long can you walk comfortably? WNL with brace, fear of movement without brace.    Diagnostic tests MRI    Patient Stated Goals Decrease muscle spasms, return to work    Currently in Pain? Yes    Pain Score 4     Pain Location Ankle    Pain Orientation Right;Medial;Lateral    Pain Descriptors / Indicators Aching    Pain Type Chronic pain;Surgical pain    Pain Onset More than a month ago    Pain Onset More than a month ago             Assessment:  Palpation: pitting edema ~2 mm with >30 sec return located at the posterior medial R ankle.    Manual Therapy:     Manual STM with mild-mod tissue pressure to facilitate reduction of swelling in the medial/lateral ankle. Promote decrease medial/lateral ankle sensitivity.  8 mins  Grade 1-2 Cuboid mobs to decrease pain and promote greater standing tolerance.  4 mins     Therapeutic Exercise:   *all completed in seated posture to promote mobility without WB* 1) ABCs on 6# med ball with verbal cueing to ensure optimal movement in the ankle without sudden uncontrolled movement.     2)  Toe curls on town x30   3) Medial/lateral ankle rotation on town with verbal cueing to complete in a pain limiting range.    4) Dyna disc frontal and sagittal plane x30 each direction       *10 min wrap iced pack R ankle with pillow case to prevent direct skin contact* NOT BILLED*       PT Education - 09/13/20 0830     Education Details Pt was educated on towel exercises that include toe curls and foot medial/lateral rotation to include in HEP    Person(s) Educated Patient    Methods Explanation;Demonstration;Verbal cues    Comprehension Verbalized understanding;Need further  instruction                 PT Long Term Goals - 08/30/20 1207       PT LONG TERM GOAL #1   Title Pt. will improve FOTO score to >62 to improve independence with ADLs    Baseline 42    Period Weeks    Status New    Target Date 10/11/20      PT LONG TERM GOAL #2   Title Pt will improve R ankle MMT to qual to L to faciliate increased stability for return to walks on the beach.    Baseline R/L: 3/5 Ankle dorsiflexion    5/25 bilat Sl calf raise Ankle plantarflexion  *pain with all MMT testing of the ankle/foot*  3/5 ankle inversion   3/5 ankle eversion    Time 6    Period Weeks    Status New    Target Date 10/11/20      PT LONG TERM GOAL #3   Title Pt will be able to complete 20 steps with R ankle pain >3/10 to facilitate greater mobility within the home.    Baseline 7-8/10 NPS    Time 6    Period Weeks    Status New    Target Date 10/11/20      PT LONG TERM GOAL #4   Title Pt will be able to complete work/home mobility taks with laced ankle braced doff >50% for return to work specific foot wear.    Baseline Pt current needs to wear high cushion tennis shoes to work, brace donned 100%    Time 6    Period Weeks    Status New    Target Date 10/11/20      PT LONG TERM GOAL #5   Title --    Baseline --    Time --    Period --    Status --                   Plan - 09/13/20 6720     Clinical Impression Statement Tx focus was on promoting mobility in the ankle joint complex without WB to facilitate muscle pumping to reduce edema and pain. Pt continues to report increased fatigue and soreness with tx regardless of regression of tx. Pt continues to display poor R ankle strength, mobility and stability resulting in increased pain. Pt. will continue to benefit from skilled physical therapy to progress POC to address remaining deficits to facilitate maximum functional capacity for optimal personal health and wellness for ADLs.  Personal Factors and Comorbidities  Comorbidity 2;Profession;Past/Current Experience    Comorbidities Hypertension, Obesity    Examination-Activity Limitations Squat;Locomotion Level    Examination-Participation Restrictions Occupation;Cleaning;Laundry    Stability/Clinical Decision Making Evolving/Moderate complexity    Clinical Decision Making Moderate    Rehab Potential Good    PT Frequency 2x / week    PT Duration 6 weeks    PT Treatment/Interventions ADLs/Self Care Home Management;Aquatic Therapy;Biofeedback;Cryotherapy;Electrical Stimulation;Moist Heat;Ultrasound;Functional mobility training;Therapeutic activities;Therapeutic exercise;Balance training;Neuromuscular re-education;Patient/family education;Manual techniques;Scar mobilization;Passive range of motion;Dry needling;Energy conservation;Taping;Gait training;Stair training;DME Instruction;Spinal Manipulations;Joint Manipulations    PT Next Visit Plan Assess R cuboid pain and mobility.    PT Home Exercise Plan [id number]    Consulted and Agree with Plan of Care Patient             Patient will benefit from skilled therapeutic intervention in order to improve the following deficits and impairments:  Decreased mobility, Decreased endurance, Hypomobility, Decreased range of motion, Decreased scar mobility, Improper body mechanics, Postural dysfunction, Impaired flexibility, Decreased strength, Decreased activity tolerance, Abnormal gait, Decreased balance, Difficulty walking, Increased edema, Impaired perceived functional ability  Visit Diagnosis: Sprain and strain of ankle  Pain in right ankle and joints of right foot  Ankle weakness     Problem List Patient Active Problem List   Diagnosis Date Noted   H/O of hemilaminectomy 02/15/2019   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   Fatigue 09/23/2017   History of pneumonia 04/30/2016   Grief reaction 04/30/2016   Vitamin D deficiency 02/22/2015   Hyperlipidemia 01/01/2015   Visit for  preventive health examination 12/04/2013   Lower extremity edema 09/02/2013   Essential hypertension 09/02/2013   History of cardiomyopathy 09/02/2013   SVT (supraventricular tachycardia) (HCC) 09/02/2013   Palpitations 09/02/2013   Contrast media allergy 03/10/2013   Fibrocystic breast disease    Morbid obesity (HCC) 02/05/2012   Meniere disease    Cammie Mcgee, PT, DPT # [id number] Lawernce Ion SPT 09/13/2020, 9:37 AM  [facility] [address] [place], [place], [address] Phone: [phone]   Fax:  [phone]  Name: TANIEKA POWNALL MRN: [id number] Date of Birth: [birth year]

## 2020-09-18 ENCOUNTER — Other Ambulatory Visit: Payer: Self-pay

## 2020-09-18 ENCOUNTER — Encounter: Payer: Self-pay | Admitting: Physical Therapy

## 2020-09-18 ENCOUNTER — Ambulatory Visit: Payer: BC Managed Care – PPO | Admitting: Physical Therapy

## 2020-09-18 DIAGNOSIS — S93409A Sprain of unspecified ligament of unspecified ankle, initial encounter: Secondary | ICD-10-CM

## 2020-09-18 DIAGNOSIS — M25571 Pain in right ankle and joints of right foot: Secondary | ICD-10-CM | POA: Diagnosis not present

## 2020-09-18 DIAGNOSIS — M6281 Muscle weakness (generalized): Secondary | ICD-10-CM

## 2020-09-18 DIAGNOSIS — R29898 Other symptoms and signs involving the musculoskeletal system: Secondary | ICD-10-CM

## 2020-09-18 DIAGNOSIS — S96919A Strain of unspecified muscle and tendon at ankle and foot level, unspecified foot, initial encounter: Secondary | ICD-10-CM | POA: Diagnosis not present

## 2020-09-18 NOTE — Therapy (Signed)
[facility] [address] [place], [place], [address] Phone: [phone]   Fax:  [phone]  Physical Therapy Treatment  Patient Details  Name: Amanda Humphrey MRN: [id number] Date of Birth: [birth year] Referring Provider (PT): Gwyneth Revels, [place]   Encounter Date: 09/18/2020   PT End of Session - 09/18/20 1523     Visit Number 6    Number of Visits 12    Date for PT Re-Evaluation 10/11/20    Authorization - Visit Number 6    Authorization - Number of Visits 10    PT Start Time 1514    PT Stop Time 1601    PT Time Calculation (min) 47 min    Activity Tolerance Patient tolerated treatment well;Patient limited by pain    Behavior During Therapy [facility] for tasks assessed/performed             Past Medical History:  Diagnosis Date   Allergy    Chronic kidney disease    stones   Fibrocystic breast disease 2013   Meniere disease    Migraines     Past Surgical History:  Procedure Laterality Date   ABDOMINAL HYSTERECTOMY  2011   laprascopic supracervical, [place]   APPENDECTOMY  1999   BREAST BIOPSY Right Nov 2012    fibrocystic disease, [place]   CESAREAN SECTION     4 total   CHOLECYSTECTOMY  2011   TONSILLECTOMY AND ADENOIDECTOMY  1975    There were no vitals filed for this visit.   Subjective Assessment - 09/18/20 1609     Subjective Pt presents to tx with 7-8/10 R ankle pain. Pt states that she had a busy day at work and did not wear her laced ankle brace to see how her pain and swelling would respond.    Pertinent History MRI results: Rupture of R ant talofibular ligament. History of severe neck pain that causes radiation to the R UE. Cervical Srugery C6-T1(early 2021). History of bilat plantar fasciitis and heel spurs. Pt's spouse has health complication resulting with increased home ADL reponsibilies. Pt enjoys visiting and walking on the beach, which she is currently unable to do at this time 2/2 ankle instability  and pain.    Limitations Lifting;House hold activities    How long can you sit comfortably? WNL    How long can you stand comfortably? WNL with brace, fear of movement without brace.    How long can you walk comfortably? WNL with brace, fear of movement without brace.    Diagnostic tests MRI    Patient Stated Goals Decrease muscle spasms, return to work    Currently in Pain? Yes    Pain Score 8     Pain Location Ankle    Pain Orientation Right;Medial;Lateral    Pain Descriptors / Indicators Aching    Pain Type Chronic pain    Pain Onset More than a month ago    Pain Onset More than a month ago              Treatment    Therapeutic Exercise:   NuStep on L 0 with verbal cueing to ensure totally plantigrade against the pedal to promote active DF/PF without complete WB. 10 minutes      Ankle pumps 3x5 between bouts of grade 2 ant talocrural joint mobs.   Standing towel exercises with shoes doffed to facilitate ankle stability without the structure of a tennis shoe.  1) 5 sec weight shift with 3  sec transition x30 each direction  2) bilat calf raise x20 with verbal cueing to only facilitate 50% of available range to not over stress the ankle 3) toe curls x 30 with verbal cueing to ensure bilat equal WB 4) Post-tib calf raises with heel ball squeeze to facilitate maximal involvement of bilat post tib    Manual Therapy:   Start of XK:PVVZS 2 ant talocrural joint mobs to facilitate decrease in ankle pain for optimal tolerance to tx. 3 mins   End of tx: Manual STM with mild-mod tissue pressure to facilitate reduction of swelling in the medial/lateral ankle. Promote decrease medial/lateral ankle sensitivity.  Biofreeze was utilized as a topical analgesic instead of massage cream.  10 mins       PT Education - 09/18/20 1612     Education Details Pt was educated on proper form and techinque during standing exercises to ensure proper tissue loading.    Person(s) Educated  Patient    Methods Explanation;Demonstration;Verbal cues;Tactile cues    Comprehension Verbalized understanding;Need further instruction                 PT Long Term Goals - 08/30/20 1207       PT LONG TERM GOAL #1   Title Pt. will improve FOTO score to >62 to improve independence with ADLs    Baseline 42    Period Weeks    Status New    Target Date 10/11/20      PT LONG TERM GOAL #2   Title Pt will improve R ankle MMT to qual to L to faciliate increased stability for return to walks on the beach.    Baseline R/L: 3/5 Ankle dorsiflexion    5/25 bilat Sl calf raise Ankle plantarflexion  *pain with all MMT testing of the ankle/foot*  3/5 ankle inversion   3/5 ankle eversion    Time 6    Period Weeks    Status New    Target Date 10/11/20      PT LONG TERM GOAL #3   Title Pt will be able to complete 20 steps with R ankle pain >3/10 to facilitate greater mobility within the home.    Baseline 7-8/10 NPS    Time 6    Period Weeks    Status New    Target Date 10/11/20      PT LONG TERM GOAL #4   Title Pt will be able to complete work/home mobility taks with laced ankle braced doff >50% for return to work specific foot wear.    Baseline Pt current needs to wear high cushion tennis shoes to work, brace donned 100%    Time 6    Period Weeks    Status New    Target Date 10/11/20      PT LONG TERM GOAL #5   Title --    Baseline --    Time --    Period --    Status --                   Plan - 09/18/20 1613     Clinical Impression Statement Today's tx was focused on facilitating ankle stability in WB with shoes doffed. This was targeted to maximize proprioceptive feedback and muscle activation that is reduce with shoe wear. Pt stated minor reduction in pain post manual intervention with a reduction in antalgic gait patterns. Pt continues to display easily irritable  pain at the medial and lateral ankle with movement. Pt continues  to display poor ankle stability and  muscle activation leading to increased risk of re injury. Pt. will continue to benefit from skilled physical therapy to progress POC to address remaining deficits to facilitate maximum functional capacity for optimal personal health and wellness for ADLs.    Personal Factors and Comorbidities Comorbidity 2;Profession;Past/Current Experience    Comorbidities Hypertension, Obesity    Examination-Activity Limitations Squat;Locomotion Level    Examination-Participation Restrictions Occupation;Cleaning;Laundry    Stability/Clinical Decision Making Evolving/Moderate complexity    Clinical Decision Making Moderate    Rehab Potential Good    PT Frequency 2x / week    PT Duration 6 weeks    PT Treatment/Interventions ADLs/Self Care Home Management;Aquatic Therapy;Biofeedback;Cryotherapy;Electrical Stimulation;Moist Heat;Ultrasound;Functional mobility training;Therapeutic activities;Therapeutic exercise;Balance training;Neuromuscular re-education;Patient/family education;Manual techniques;Scar mobilization;Passive range of motion;Dry needling;Energy conservation;Taping;Gait training;Stair training;DME Instruction;Spinal Manipulations;Joint Manipulations    PT Next Visit Plan Reassess soreness    PT Home Exercise Plan [id number]    Consulted and Agree with Plan of Care Patient             Patient will benefit from skilled therapeutic intervention in order to improve the following deficits and impairments:  Decreased mobility, Decreased endurance, Hypomobility, Decreased range of motion, Decreased scar mobility, Improper body mechanics, Postural dysfunction, Impaired flexibility, Decreased strength, Decreased activity tolerance, Abnormal gait, Decreased balance, Difficulty walking, Increased edema, Impaired perceived functional ability  Visit Diagnosis: Sprain and strain of ankle  Ankle weakness  Pain in right ankle and joints of right foot  Muscle weakness (generalized)     Problem  List Patient Active Problem List   Diagnosis Date Noted   H/O of hemilaminectomy 02/15/2019   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   Fatigue 09/23/2017   History of pneumonia 04/30/2016   Grief reaction 04/30/2016   Vitamin D deficiency 02/22/2015   Hyperlipidemia 01/01/2015   Visit for preventive health examination 12/04/2013   Lower extremity edema 09/02/2013   Essential hypertension 09/02/2013   History of cardiomyopathy 09/02/2013   SVT (supraventricular tachycardia) (HCC) 09/02/2013   Palpitations 09/02/2013   Contrast media allergy 03/10/2013   Fibrocystic breast disease    Morbid obesity (HCC) 02/05/2012   Meniere disease    Cammie Mcgee, PT, DPT # [id number] Lawernce Ion SPT 09/18/2020, 4:38 PM  [place] [facility] [address] [place], [place], [address] Phone: [phone]   Fax:  [phone]  Name: Amanda Humphrey MRN: [id number] Date of Birth: [birth year]

## 2020-09-20 ENCOUNTER — Ambulatory Visit: Payer: BC Managed Care – PPO | Admitting: Physical Therapy

## 2020-09-20 ENCOUNTER — Encounter: Payer: Self-pay | Admitting: Physical Therapy

## 2020-09-20 ENCOUNTER — Other Ambulatory Visit: Payer: Self-pay

## 2020-09-20 DIAGNOSIS — R29898 Other symptoms and signs involving the musculoskeletal system: Secondary | ICD-10-CM

## 2020-09-20 DIAGNOSIS — S96919A Strain of unspecified muscle and tendon at ankle and foot level, unspecified foot, initial encounter: Secondary | ICD-10-CM | POA: Diagnosis not present

## 2020-09-20 DIAGNOSIS — M6281 Muscle weakness (generalized): Secondary | ICD-10-CM | POA: Diagnosis not present

## 2020-09-20 DIAGNOSIS — M25571 Pain in right ankle and joints of right foot: Secondary | ICD-10-CM

## 2020-09-20 DIAGNOSIS — S93409A Sprain of unspecified ligament of unspecified ankle, initial encounter: Secondary | ICD-10-CM | POA: Diagnosis not present

## 2020-09-20 NOTE — Therapy (Signed)
[place] [facility] [address] [place], [place], [address] Phone: [phone]   Fax:  [phone]  Physical Therapy Treatment  Patient Details  Name: Amanda Humphrey MRN: [id number] Date of Birth: [birth year] Referring Provider (PT): Gwyneth Revels, [place]   Encounter Date: 09/20/2020   PT End of Session - 09/20/20 0906     Visit Number 7    Number of Visits 12    Date for PT Re-Evaluation 10/11/20    Authorization - Visit Number 7    Authorization - Number of Visits 10    PT Start Time 0731    PT Stop Time 0816    PT Time Calculation (min) 45 min    Activity Tolerance Patient tolerated treatment well;Patient limited by pain    Behavior During Therapy [facility] for tasks assessed/performed             Past Medical History:  Diagnosis Date   Allergy    Chronic kidney disease    stones   Fibrocystic breast disease 2013   Meniere disease    Migraines     Past Surgical History:  Procedure Laterality Date   ABDOMINAL HYSTERECTOMY  2011   laprascopic supracervical, [place]   APPENDECTOMY  1999   BREAST BIOPSY Right Nov 2012    fibrocystic disease, [place]   CESAREAN SECTION     4 total   CHOLECYSTECTOMY  2011   TONSILLECTOMY AND ADENOIDECTOMY  1975    There were no vitals filed for this visit.   Subjective Assessment - 09/20/20 0741     Subjective Pt presented to tx with 3-4/10 R ankle pain. Pt state that she has had a very busy and stressful work week. Pt stated that she has had pain around the whole distal ankle.    Pertinent History MRI results: Rupture of R ant talofibular ligament. History of severe neck pain that causes radiation to the R UE. Cervical Srugery C6-T1(early 2021). History of bilat plantar fasciitis and heel spurs. Pt's spouse has health complication resulting with increased home ADL reponsibilies. Pt enjoys visiting and walking on the beach, which she is currently unable to do at this time 2/2 ankle instability  and pain.    Limitations Lifting;House hold activities    How long can you sit comfortably? WNL    How long can you stand comfortably? WNL with brace, fear of movement without brace.    How long can you walk comfortably? WNL with brace, fear of movement without brace.    Diagnostic tests MRI    Patient Stated Goals Decrease muscle spasms, return to work    Currently in Pain? Yes    Pain Score 4     Pain Location Ankle    Pain Orientation Right;Medial;Lateral    Pain Descriptors / Indicators Aching    Pain Onset More than a month ago    Pain Onset More than a month ago                Treatment      Therapeutic Exercise:     NuStep on L 2 with verbal cueing to ensure totally plantigrade against the pedal to promote active DF/PF without complete WB. 7 Mins on position 10 and 3 mins on position 9 to increase DF.       BOSU balls seated DF/PF and inversion/eversion x30 in each direction.   Standing heel/toe taps x10 each leg over 6'' hurdle and touch assist at the // bars.  Seated towel toe curls and medial/lateral rotation to facilitate joint mobility without WB  x30   Standing towel exercises with shoes doffed to facilitate ankle stability without the structure of a tennis shoe.   1) toe curls x 30 with verbal cueing to ensure bilat equal WB 2) Post-tib calf raises with heel ball squeeze to facilitate maximal involvement of bilat post tib       Manual Therapy:    End of tx: Manual STM with mild-mod tissue pressure to facilitate reduction of swelling in the medial/lateral ankle. Promote decrease medial/lateral ankle sensitivity.  Biofreeze was utilized as a topical analgesic instead of massage cream.  11 mins          PT Long Term Goals - 08/30/20 1207       PT LONG TERM GOAL #1   Title Pt. will improve FOTO score to >62 to improve independence with ADLs    Baseline 42    Period Weeks    Status New    Target Date 10/11/20      PT LONG TERM GOAL #2    Title Pt will improve R ankle MMT to qual to L to faciliate increased stability for return to walks on the beach.    Baseline R/L: 3/5 Ankle dorsiflexion    5/25 bilat Sl calf raise Ankle plantarflexion  *pain with all MMT testing of the ankle/foot*  3/5 ankle inversion   3/5 ankle eversion    Time 6    Period Weeks    Status New    Target Date 10/11/20      PT LONG TERM GOAL #3   Title Pt will be able to complete 20 steps with R ankle pain >3/10 to facilitate greater mobility within the home.    Baseline 7-8/10 NPS    Time 6    Period Weeks    Status New    Target Date 10/11/20      PT LONG TERM GOAL #4   Title Pt will be able to complete work/home mobility taks with laced ankle braced doff >50% for return to work specific foot wear.    Baseline Pt current needs to wear high cushion tennis shoes to work, brace donned 100%    Time 6    Period Weeks    Status New    Target Date 10/11/20      PT LONG TERM GOAL #5   Title --    Baseline --    Time --    Period --    Status --                   Plan - 09/20/20 0906     Clinical Impression Statement Today's tx was focused on increasing stability of the R ankle with seated and standing posture. Pt stated that she is working from home and would be able to apply ice for swelling management at home. Pt continues to report significant pain during any mobility that is increase with WB. Pt. will continue to benefit from skilled physical therapy to progress POC to address remaining deficits to facilitate maximum functional capacity for optimal personal health and wellness for ADLs.    Personal Factors and Comorbidities Comorbidity 2;Profession;Past/Current Experience    Comorbidities Hypertension, Obesity    Examination-Activity Limitations Squat;Locomotion Level    Examination-Participation Restrictions Occupation;Cleaning;Laundry    Stability/Clinical Decision Making Evolving/Moderate complexity    Clinical Decision Making  Moderate    Rehab Potential Good    PT  Frequency 2x / week    PT Duration 6 weeks    PT Treatment/Interventions ADLs/Self Care Home Management;Aquatic Therapy;Biofeedback;Cryotherapy;Electrical Stimulation;Moist Heat;Ultrasound;Functional mobility training;Therapeutic activities;Therapeutic exercise;Balance training;Neuromuscular re-education;Patient/family education;Manual techniques;Scar mobilization;Passive range of motion;Dry needling;Energy conservation;Taping;Gait training;Stair training;DME Instruction;Spinal Manipulations;Joint Manipulations    PT Next Visit Plan Reassess soreness    PT Home Exercise Plan [id number]    Consulted and Agree with Plan of Care Patient             Patient will benefit from skilled therapeutic intervention in order to improve the following deficits and impairments:  Decreased mobility, Decreased endurance, Hypomobility, Decreased range of motion, Decreased scar mobility, Improper body mechanics, Postural dysfunction, Impaired flexibility, Decreased strength, Decreased activity tolerance, Abnormal gait, Decreased balance, Difficulty walking, Increased edema, Impaired perceived functional ability  Visit Diagnosis: Sprain and strain of ankle  Ankle weakness  Pain in right ankle and joints of right foot     Problem List Patient Active Problem List   Diagnosis Date Noted   H/O of hemilaminectomy 02/15/2019   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   Fatigue 09/23/2017   History of pneumonia 04/30/2016   Grief reaction 04/30/2016   Vitamin D deficiency 02/22/2015   Hyperlipidemia 01/01/2015   Visit for preventive health examination 12/04/2013   Lower extremity edema 09/02/2013   Essential hypertension 09/02/2013   History of cardiomyopathy 09/02/2013   SVT (supraventricular tachycardia) (HCC) 09/02/2013   Palpitations 09/02/2013   Contrast media allergy 03/10/2013   Fibrocystic breast disease    Morbid obesity (HCC) 02/05/2012    Meniere disease    Cammie Mcgee, PT, DPT # [id number] Lawernce Ion SPT 09/20/2020, 9:29 AM  [place] [facility] [address] [place], [place], [address] Phone: [phone]   Fax:  [phone]  Name: Amanda Humphrey MRN: [id number] Date of Birth: [birth year]

## 2020-09-25 ENCOUNTER — Encounter: Payer: Self-pay | Admitting: Physical Therapy

## 2020-09-25 ENCOUNTER — Other Ambulatory Visit: Payer: Self-pay

## 2020-09-25 ENCOUNTER — Ambulatory Visit: Payer: BC Managed Care – PPO | Admitting: Physical Therapy

## 2020-09-25 DIAGNOSIS — M25571 Pain in right ankle and joints of right foot: Secondary | ICD-10-CM | POA: Diagnosis not present

## 2020-09-25 DIAGNOSIS — R29898 Other symptoms and signs involving the musculoskeletal system: Secondary | ICD-10-CM | POA: Diagnosis not present

## 2020-09-25 DIAGNOSIS — M6281 Muscle weakness (generalized): Secondary | ICD-10-CM

## 2020-09-25 DIAGNOSIS — S96919A Strain of unspecified muscle and tendon at ankle and foot level, unspecified foot, initial encounter: Secondary | ICD-10-CM | POA: Diagnosis not present

## 2020-09-25 DIAGNOSIS — S93409A Sprain of unspecified ligament of unspecified ankle, initial encounter: Secondary | ICD-10-CM | POA: Diagnosis not present

## 2020-09-25 NOTE — Therapy (Signed)
[place] [facility] [address] [place], [place], [address] Phone: [phone]   Fax:  [phone]  Physical Therapy Treatment  Patient Details  Name: Amanda Humphrey MRN: [id number] Date of Birth: [birth year] Referring Provider (PT): Gwyneth Revels, DPM   Encounter Date: 09/25/2020   PT End of Session - 09/25/20 2426     Visit Number 8    Number of Visits 12    Date for PT Re-Evaluation 10/11/20    Authorization - Visit Number 8    Authorization - Number of Visits 10    PT Start Time 0741    PT Stop Time 0817    PT Time Calculation (min) 36 min    Activity Tolerance Patient tolerated treatment well;Patient limited by pain    Behavior During Therapy [facility] for tasks assessed/performed             Past Medical History:  Diagnosis Date   Allergy    Chronic kidney disease    stones   Fibrocystic breast disease 2013   Meniere disease    Migraines     Past Surgical History:  Procedure Laterality Date   ABDOMINAL HYSTERECTOMY  2011   laprascopic supracervical, [place]   APPENDECTOMY  1999   BREAST BIOPSY Right Nov 2012    fibrocystic disease, [place]   CESAREAN SECTION     4 total   CHOLECYSTECTOMY  2011   TONSILLECTOMY AND ADENOIDECTOMY  1975    There were no vitals filed for this visit.   Subjective Assessment - 09/25/20 0746     Subjective Pt presents to tx with 3/10 R ankle pain. Pt stated that she was very sore after last tx with sx reduction close to 48 hours with advil intervention. Pt states that the YTB is becoming light during HEP.    Pertinent History MRI results: Rupture of R ant talofibular ligament. History of severe neck pain that causes radiation to the R UE. Cervical Srugery C6-T1(early 2021). History of bilat plantar fasciitis and heel spurs. Pt's spouse has health complication resulting with increased home ADL reponsibilies. Pt enjoys visiting and walking on the beach, which she is currently unable to do at  this time 2/2 ankle instability and pain.    Limitations Lifting;House hold activities    How long can you sit comfortably? WNL    How long can you stand comfortably? WNL with brace, fear of movement without brace.    How long can you walk comfortably? WNL with brace, fear of movement without brace.    Diagnostic tests MRI    Patient Stated Goals Decrease muscle spasms, return to work    Currently in Pain? Yes    Pain Score 3     Pain Location Ankle    Pain Orientation Right;Medial;Lateral    Pain Onset More than a month ago    Pain Onset More than a month ago               Treatment      Therapeutic Exercise:     NuStep on L 4 with verbal cueing to ensure totally plantigrade against the pedal to promote active DF/PF without complete WB. 10 Mins on position 10    Isometric 8 sec x 5 in each direction with verbal cueing to facilitate 30% of max contraction. DF, inversion, eversion.   Seated towel toe curls and medial/lateral rotation to facilitate joint mobility without WB  x30    Standing towel exercises with shoes  doffed to facilitate ankle stability without the structure of a tennis shoe.    1) standard calf raise with verbal cueing to facilitate slow movement within a 50% of range.  2) Post-tib calf raises with heel ball squeeze to facilitate maximal involvement of bilat post tib   *Ice 10 mins at the end of tx. Not billed*      PT Education - 09/25/20 0831     Education Details Pt was educated on proper sx reponse with progression of HEP with RTB.    Person(s) Educated Patient    Methods Explanation;Demonstration;Tactile cues;Verbal cues    Comprehension Verbalized understanding                 PT Long Term Goals - 08/30/20 1207       PT LONG TERM GOAL #1   Title Pt. will improve FOTO score to >62 to improve independence with ADLs    Baseline 42    Period Weeks    Status New    Target Date 10/11/20      PT LONG TERM GOAL #2   Title Pt will  improve R ankle MMT to qual to L to faciliate increased stability for return to walks on the beach.    Baseline R/L: 3/5 Ankle dorsiflexion    5/25 bilat Sl calf raise Ankle plantarflexion  *pain with all MMT testing of the ankle/foot*  3/5 ankle inversion   3/5 ankle eversion    Time 6    Period Weeks    Status New    Target Date 10/11/20      PT LONG TERM GOAL #3   Title Pt will be able to complete 20 steps with R ankle pain >3/10 to facilitate greater mobility within the home.    Baseline 7-8/10 NPS    Time 6    Period Weeks    Status New    Target Date 10/11/20      PT LONG TERM GOAL #4   Title Pt will be able to complete work/home mobility taks with laced ankle braced doff >50% for return to work specific foot wear.    Baseline Pt current needs to wear high cushion tennis shoes to work, brace donned 100%    Time 6    Period Weeks    Status New    Target Date 10/11/20      PT LONG TERM GOAL #5   Title --    Baseline --    Time --    Period --    Status --                   Plan - 09/25/20 2542     Clinical Impression Statement Today's treatment was done with a lighter intensity due to increased sx response after last tx. Tx duration was also reduced 2/2 time limitation. Pt continues to display mark LE endurance, swelling, strength, and ROM deficits resulting in decreased safe home/community mobiity, increased pain, and limited access to QOL. Pt. will continue to benefit from skilled physical therapy to progress POC to address remaining deficits to facilitate maximum functional capacity for optimal personal health and wellness for ADLs.    Personal Factors and Comorbidities Comorbidity 2;Profession;Past/Current Experience    Comorbidities Hypertension, Obesity    Examination-Activity Limitations Squat;Locomotion Level    Examination-Participation Restrictions Occupation;Cleaning;Laundry    Stability/Clinical Decision Making Evolving/Moderate complexity    Clinical  Decision Making Moderate    Rehab Potential Good    PT  Frequency 2x / week    PT Duration 6 weeks    PT Treatment/Interventions ADLs/Self Care Home Management;Aquatic Therapy;Biofeedback;Cryotherapy;Electrical Stimulation;Moist Heat;Ultrasound;Functional mobility training;Therapeutic activities;Therapeutic exercise;Balance training;Neuromuscular re-education;Patient/family education;Manual techniques;Scar mobilization;Passive range of motion;Dry needling;Energy conservation;Taping;Gait training;Stair training;DME Instruction;Spinal Manipulations;Joint Manipulations    PT Next Visit Plan Reassess soreness    PT Home Exercise Plan [id number]    Consulted and Agree with Plan of Care Patient             Patient will benefit from skilled therapeutic intervention in order to improve the following deficits and impairments:  Decreased mobility, Decreased endurance, Hypomobility, Decreased range of motion, Decreased scar mobility, Improper body mechanics, Postural dysfunction, Impaired flexibility, Decreased strength, Decreased activity tolerance, Abnormal gait, Decreased balance, Difficulty walking, Increased edema, Impaired perceived functional ability  Visit Diagnosis: Sprain and strain of ankle  Ankle weakness  Pain in right ankle and joints of right foot  Muscle weakness (generalized)     Problem List Patient Active Problem List   Diagnosis Date Noted   H/O of hemilaminectomy 02/15/2019   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   Fatigue 09/23/2017   History of pneumonia 04/30/2016   Grief reaction 04/30/2016   Vitamin D deficiency 02/22/2015   Hyperlipidemia 01/01/2015   Visit for preventive health examination 12/04/2013   Lower extremity edema 09/02/2013   Essential hypertension 09/02/2013   History of cardiomyopathy 09/02/2013   SVT (supraventricular tachycardia) (HCC) 09/02/2013   Palpitations 09/02/2013   Contrast media allergy 03/10/2013   Fibrocystic  breast disease    Morbid obesity (HCC) 02/05/2012   Meniere disease    Cammie Mcgee, PT, DPT # [id number] Lawernce Ion SPT 09/25/2020, 9:24 AM  [place] [facility] [address] [place], [place], [address] Phone: [phone]   Fax:  [phone]  Name: OPIE FANTON MRN: [id number] Date of Birth: [birth year]

## 2020-09-27 ENCOUNTER — Other Ambulatory Visit: Payer: Self-pay

## 2020-09-27 ENCOUNTER — Encounter: Payer: Self-pay | Admitting: Physical Therapy

## 2020-09-27 ENCOUNTER — Ambulatory Visit: Payer: BC Managed Care – PPO | Attending: Podiatry | Admitting: Physical Therapy

## 2020-09-27 DIAGNOSIS — M6281 Muscle weakness (generalized): Secondary | ICD-10-CM | POA: Insufficient documentation

## 2020-09-27 DIAGNOSIS — M25571 Pain in right ankle and joints of right foot: Secondary | ICD-10-CM | POA: Insufficient documentation

## 2020-09-27 DIAGNOSIS — S96919A Strain of unspecified muscle and tendon at ankle and foot level, unspecified foot, initial encounter: Secondary | ICD-10-CM | POA: Diagnosis not present

## 2020-09-27 DIAGNOSIS — R29898 Other symptoms and signs involving the musculoskeletal system: Secondary | ICD-10-CM | POA: Insufficient documentation

## 2020-09-27 DIAGNOSIS — S93409A Sprain of unspecified ligament of unspecified ankle, initial encounter: Secondary | ICD-10-CM | POA: Diagnosis not present

## 2020-09-27 DIAGNOSIS — M79601 Pain in right arm: Secondary | ICD-10-CM | POA: Insufficient documentation

## 2020-09-27 DIAGNOSIS — M2560 Stiffness of unspecified joint, not elsewhere classified: Secondary | ICD-10-CM | POA: Insufficient documentation

## 2020-09-27 NOTE — Therapy (Signed)
[facility] [address] [place], [place], [address] Phone: [phone]   Fax:  [phone]  Physical Therapy Treatment  Patient Details  Name: Amanda Humphrey MRN: [id number] Date of Birth: 01/20/1966 Referring Provider (PT): Gwyneth Revels, [place]   Encounter Date: 09/27/2020   PT End of Session - 09/27/20 0912     Visit Number 9    Number of Visits 12    Date for PT Re-Evaluation 10/11/20    Authorization - Visit Number 9    Authorization - Number of Visits 10    PT Start Time 0738    PT Stop Time 0818    PT Time Calculation (min) 40 min    Activity Tolerance Patient tolerated treatment well;Patient limited by pain    Behavior During Therapy [facility] for tasks assessed/performed             Past Medical History:  Diagnosis Date   Allergy    Chronic kidney disease    stones   Fibrocystic breast disease 2013   Meniere disease    Migraines     Past Surgical History:  Procedure Laterality Date   ABDOMINAL HYSTERECTOMY  2011   laprascopic supracervical, [place]   APPENDECTOMY  1999   BREAST BIOPSY Right Nov 2012    fibrocystic disease, [place]   CESAREAN SECTION     4 total   CHOLECYSTECTOMY  2011   TONSILLECTOMY AND ADENOIDECTOMY  1975    There were no vitals filed for this visit.   Subjective Assessment - 09/27/20 0803     Subjective Pt present to tx with 4-5/10 R ankle pain. Pt states that her ankle has been very sore and tight over the last 48 hours. Pt states that she thinks the NuStep bike caused the increased soreness.    Pertinent History MRI results: Rupture of R ant talofibular ligament. History of severe neck pain that causes radiation to the R UE. Cervical Srugery C6-T1(early 2021). History of bilat plantar fasciitis and heel spurs. Pt's spouse has health complication resulting with increased home ADL reponsibilies. Pt enjoys visiting and walking on the beach, which she is currently unable to do at this time  2/2 ankle instability and pain.    Limitations Lifting;House hold activities    How long can you sit comfortably? WNL    How long can you stand comfortably? WNL with brace, fear of movement without brace.    How long can you walk comfortably? WNL with brace, fear of movement without brace.    Diagnostic tests MRI    Patient Stated Goals Decrease muscle spasms, return to work    Currently in Pain? Yes    Pain Score 5     Pain Location Ankle    Pain Orientation Right;Lateral;Medial    Pain Descriptors / Indicators Aching    Pain Type Chronic pain    Pain Onset More than a month ago    Pain Onset More than a month ago               Manual Therapy 16:     End of tx: Manual STM with mild-mod tissue pressure to facilitate reduction of swelling in the medial/lateral ankle. Promote decrease medial/lateral ankle sensitivity.  Biofreeze was utilized as a topical analgesic instead of massage cream.   mins  Grade 2 talocrural distraction sustained 30 sec bouts x 5 with intermittent rest and sustained DF OP to facilitate increased joint space with range of motion to  promote reduction in ankle swelling.   Therapeutic Exercise:  Isometric 8 sec x 5  with verbal cueing to facilitate 30% of max contraction. DF   Seated towel toe curls and medial/lateral rotation to facilitate joint mobility without WB  x30    Seated calf raised to facilitate increase in ankle motion without WB    Seated ball ABCs x 1 bout. Verbal cueing to get the movement small to not increase ankle pain   Seated dyna-disc DF-PF and inversion-eversion x30 in each direction.      PT Education - 09/27/20 0911     Education Details Pt was educated on purchasing a compression sleeve for her ankle to manage swelling.    Person(s) Educated Patient    Methods Explanation    Comprehension Verbalized understanding                 PT Long Term Goals - 08/30/20 1207       PT LONG TERM GOAL #1   Title Pt. will  improve FOTO score to >62 to improve independence with ADLs    Baseline 42    Period Weeks    Status New    Target Date 10/11/20      PT LONG TERM GOAL #2   Title Pt will improve R ankle MMT to qual to L to faciliate increased stability for return to walks on the beach.    Baseline R/L: 3/5 Ankle dorsiflexion    5/25 bilat Sl calf raise Ankle plantarflexion  *pain with all MMT testing of the ankle/foot*  3/5 ankle inversion   3/5 ankle eversion    Time 6    Period Weeks    Status New    Target Date 10/11/20      PT LONG TERM GOAL #3   Title Pt will be able to complete 20 steps with R ankle pain >3/10 to facilitate greater mobility within the home.    Baseline 7-8/10 NPS    Time 6    Period Weeks    Status New    Target Date 10/11/20      PT LONG TERM GOAL #4   Title Pt will be able to complete work/home mobility taks with laced ankle braced doff >50% for return to work specific foot wear.    Baseline Pt current needs to wear high cushion tennis shoes to work, brace donned 100%    Time 6    Period Weeks    Status New    Target Date 10/11/20      PT LONG TERM GOAL #5   Title --    Baseline --    Time --    Period --    Status --                   Plan - 09/27/20 0912     Clinical Impression Statement Today's tx was focused on decreasing pt reported pain and stiffness. Pt stated increased in stiffness post intervention. Pt education was given on purchasing a ankle compression sleeve for better swelling management. Pt continues to display mark LE endurance, strength, and ROM deficits resulting in decreased safe home/community mobiity, increased pain, and limited access to QOL. Pt. will continue to benefit from skilled physical therapy to progress POC to address remaining deficits to facilitate maximum functional capacity for optimal personal health and wellness for ADLs.    Personal Factors and Comorbidities Comorbidity 2;Profession;Past/Current Experience     Comorbidities Hypertension, Obesity  Examination-Activity Limitations Squat;Locomotion Level    Examination-Participation Restrictions Occupation;Cleaning;Laundry    Stability/Clinical Decision Making Evolving/Moderate complexity    Clinical Decision Making Moderate    Rehab Potential Good    PT Frequency 2x / week    PT Duration 6 weeks    PT Treatment/Interventions ADLs/Self Care Home Management;Aquatic Therapy;Biofeedback;Cryotherapy;Electrical Stimulation;Moist Heat;Ultrasound;Functional mobility training;Therapeutic activities;Therapeutic exercise;Balance training;Neuromuscular re-education;Patient/family education;Manual techniques;Scar mobilization;Passive range of motion;Dry needling;Energy conservation;Taping;Gait training;Stair training;DME Instruction;Spinal Manipulations;Joint Manipulations    PT Next Visit Plan Reassess soreness and use of compression brace for ankle/foot    PT Home Exercise Plan [id number]    Consulted and Agree with Plan of Care Patient             Patient will benefit from skilled therapeutic intervention in order to improve the following deficits and impairments:  Decreased mobility, Decreased endurance, Hypomobility, Decreased range of motion, Decreased scar mobility, Improper body mechanics, Postural dysfunction, Impaired flexibility, Decreased strength, Decreased activity tolerance, Abnormal gait, Decreased balance, Difficulty walking, Increased edema, Impaired perceived functional ability  Visit Diagnosis: Sprain and strain of ankle  Joint stiffness of spine  Pain in right arm  Ankle weakness  Pain in right ankle and joints of right foot  Muscle weakness (generalized)     Problem List Patient Active Problem List   Diagnosis Date Noted   H/O of hemilaminectomy 02/15/2019   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   Fatigue 09/23/2017   History of pneumonia 04/30/2016   Grief reaction 04/30/2016   Vitamin D deficiency  02/22/2015   Hyperlipidemia 01/01/2015   Visit for preventive health examination 12/04/2013   Lower extremity edema 09/02/2013   Essential hypertension 09/02/2013   History of cardiomyopathy 09/02/2013   SVT (supraventricular tachycardia) (HCC) 09/02/2013   Palpitations 09/02/2013   Contrast media allergy 03/10/2013   Fibrocystic breast disease    Morbid obesity (HCC) 02/05/2012   Meniere disease    Cammie Mcgee, PT, DPT # [id number] Lawernce Ion SPT 09/27/2020, 12:52 PM  [facility] [address] [place], [place], [address] Phone: [phone]   Fax:  [phone]  Name: LASHAWN BROMWELL MRN: [id number] Date of Birth: Jun 09, 1965

## 2020-10-02 ENCOUNTER — Ambulatory Visit: Payer: BC Managed Care – PPO | Admitting: Physical Therapy

## 2020-10-02 ENCOUNTER — Other Ambulatory Visit: Payer: Self-pay

## 2020-10-02 ENCOUNTER — Encounter: Payer: Self-pay | Admitting: Physical Therapy

## 2020-10-02 DIAGNOSIS — M25571 Pain in right ankle and joints of right foot: Secondary | ICD-10-CM | POA: Diagnosis not present

## 2020-10-02 DIAGNOSIS — M2560 Stiffness of unspecified joint, not elsewhere classified: Secondary | ICD-10-CM | POA: Diagnosis not present

## 2020-10-02 DIAGNOSIS — R29898 Other symptoms and signs involving the musculoskeletal system: Secondary | ICD-10-CM

## 2020-10-02 DIAGNOSIS — M6281 Muscle weakness (generalized): Secondary | ICD-10-CM

## 2020-10-02 DIAGNOSIS — S96919A Strain of unspecified muscle and tendon at ankle and foot level, unspecified foot, initial encounter: Secondary | ICD-10-CM | POA: Diagnosis not present

## 2020-10-02 DIAGNOSIS — M79601 Pain in right arm: Secondary | ICD-10-CM | POA: Diagnosis not present

## 2020-10-02 DIAGNOSIS — S93409A Sprain of unspecified ligament of unspecified ankle, initial encounter: Secondary | ICD-10-CM | POA: Diagnosis not present

## 2020-10-02 NOTE — Therapy (Signed)
[facility] [address] [place], [place], [address] Phone: [phone]   Fax:  [phone]  Physical Therapy Treatment and progress note 08/30/20-10/02/20  Patient Details  Name: Amanda Humphrey MRN: [id number] Date of Birth: 11/27/1965 Referring Provider (PT): Samara Deist, [place]   Encounter Date: 10/02/2020   PT End of Session - 10/02/20 0818     Visit Number 10    Number of Visits 12    Date for PT Re-Evaluation 10/11/20    Authorization - Visit Number 10    Authorization - Number of Visits 10    PT Start Time 0240    PT Stop Time 0816    PT Time Calculation (min) 39 min    Activity Tolerance Patient tolerated treatment well;Patient limited by pain    Behavior During Therapy [facility] for tasks assessed/performed             Past Medical History:  Diagnosis Date   Allergy    Chronic kidney disease    stones   Fibrocystic breast disease 2013   Meniere disease    Migraines     Past Surgical History:  Procedure Laterality Date   ABDOMINAL HYSTERECTOMY  2011   laprascopic supracervical, [place]   APPENDECTOMY  1999   BREAST BIOPSY Right Nov 2012    fibrocystic disease, [place]   CESAREAN SECTION     4 total   CHOLECYSTECTOMY  2011   [place]    There were no vitals filed for this visit.   Subjective Assessment - 10/02/20 0742     Subjective Pt states that she had pain in the ant ankle during HEP. Pt states that her ankle feels very swollen at the start of tx. Pt stated 4/10 pain at the start of tx. Pt states 30-50% improvement from IE. Pt states good return of strength and ROM but pain and swelling is her limiting factor.    Pertinent History MRI results: Rupture of R ant talofibular ligament. History of severe neck pain that causes radiation to the R UE. Cervical Srugery C6-T1(early 2021). History of bilat plantar fasciitis and heel spurs. Pt's spouse has health complication resulting with  increased home ADL reponsibilies. Pt enjoys visiting and walking on the beach, which she is currently unable to do at this time 2/2 ankle instability and pain.    Limitations Lifting;House hold activities    How long can you sit comfortably? WNL    How long can you stand comfortably? WNL with brace, fear of movement without brace.    How long can you walk comfortably? WNL with brace, fear of movement without brace.    Diagnostic tests MRI    Patient Stated Goals Decrease muscle spasms, return to work    Currently in Pain? Yes    Pain Score 4     Pain Location Ankle    Pain Orientation Right;Lateral;Medial    Pain Descriptors / Indicators Aching    Pain Type Chronic pain    Pain Onset More than a month ago    Pain Onset More than a month ago               Treatment      Therapeutic Exercise:     NuStep on L 0 with verbal cueing to ensure totally plantigrade against the pedal to promote active DF/PF without complete WB. 8 mins were completed 2/2 to pain.     Reassessment:   FOTO: 57  MMT: bilat Sl calf raise Ankle plantarflexion  *pain with all MMT testing of the ankle/foot*  4/5 ankle inversion   4/5 ankle eversion  Amb function: NPS 2/10 at the lowest. 8 at the worse. Stair climbing is very difficult  Brace:  laced brace doffed during ADLs the last 4 days. PT will wear brace during long work days. Pt has order compression ankle brace for swelling management.   Manual Therapy     End of tx: Manual STM with mild-mod tissue pressure to facilitate reduction of swelling in the medial/lateral ankle. Promote decrease medial/lateral ankle sensitivity.  Biofreeze was utilized as a topical analgesic instead of massage cream.  12 mins         PT Education - 10/02/20 1046     Education Details Pt was educated on current progression of functional mobility compared to eval.    Person(s) Educated Patient    Methods Explanation    Comprehension Verbalized understanding                  PT Long Term Goals - 10/02/20 0749       PT LONG TERM GOAL #1   Title Pt. will improve FOTO score to >62 to improve independence with ADLs    Baseline 42 9/6: 57    Time 6    Period Weeks    Status New    Target Date 10/11/20      PT LONG TERM GOAL #2   Title Pt will improve R ankle MMT to qual to L to faciliate increased stability for return to walks on the beach.    Baseline R/L: 3/5 Ankle dorsiflexion    5/25 bilat Sl calf raise Ankle plantarflexion  *pain with all MMT testing of the ankle/foot*  3/5 ankle inversion   3/5 ankle eversion 9/6: R/L: 4+/5 Ankle dorsiflexion    8/25 bilat Sl calf raise Ankle plantarflexion  *pain with all MMT testing of the ankle/foot*  4/5 ankle inversion   4/5 ankle eversion    Time 6    Period Weeks    Status Partially Met    Target Date 10/11/20      PT LONG TERM GOAL #3   Title Pt will be able to complete 20 steps with R ankle pain >3/10 to facilitate greater mobility within the home.    Baseline 7-8/10 NPS 9/6: 2/10 at the lowest. 8 at the worse. Stair climbing is very difficult    Time 6    Period Weeks    Status New    Target Date 10/11/20      PT LONG TERM GOAL #4   Title Pt will be able to complete work/home mobility taks with laced ankle braced doff >50% for return to work specific foot wear.    Baseline Pt current needs to wear high cushion tennis shoes to work, brace donned 100% 9/6: laced brace doffed during ADLs the last 4 days. PT will wear brace during long work days.    Time 6    Period Weeks    Status Partially Met    Target Date 10/11/20                   Plan - 10/02/20 1047     Clinical Impression Statement Pt was reassessed on this date 10/02/2020. PT re-eval displayed the following findings: FOTO: 30. MMT: bilat Sl calf raise Ankle plantarflexion  *pain with all MMT testing of the ankle/foot*  4/5 ankle inversion  4/5 ankle eversion. Amb function: NPS 2/10 at the lowest. 8 at the worse.  Stair climbing is very difficult and painful. Brace:  laced brace doffed during ADLs the last 4 days. PT will wear brace during long work days. Pt has order compression ankle brace for swelling management. PT states 30-50% improvement from eval that is most limited by pain and swelling. Pt continues to display mark LE endurance, strength, and ROM deficits resulting in decreased safe home/community mobiity, increased pain, and limited access to QOL. Pt. will continue to benefit from skilled physical therapy to progress POC to address remaining deficits to facilitate maximum functional capacity for optimal personal health and wellness for ADLs.    Personal Factors and Comorbidities Comorbidity 2;Profession;Past/Current Experience    Comorbidities Hypertension, Obesity    Examination-Activity Limitations Squat;Locomotion Level    Examination-Participation Restrictions Occupation;Cleaning;Laundry    Stability/Clinical Decision Making Evolving/Moderate complexity    Clinical Decision Making Moderate    Rehab Potential Good    PT Frequency 2x / week    PT Duration 6 weeks    PT Treatment/Interventions ADLs/Self Care Home Management;Aquatic Therapy;Biofeedback;Cryotherapy;Electrical Stimulation;Moist Heat;Ultrasound;Functional mobility training;Therapeutic activities;Therapeutic exercise;Balance training;Neuromuscular re-education;Patient/family education;Manual techniques;Scar mobilization;Passive range of motion;Dry needling;Energy conservation;Taping;Gait training;Stair training;DME Instruction;Spinal Manipulations;Joint Manipulations    PT Next Visit Plan Reassess adherence to sock compression    PT Home Exercise Plan [id number]    Consulted and Agree with Plan of Care Patient             Patient will benefit from skilled therapeutic intervention in order to improve the following deficits and impairments:  Decreased mobility, Decreased endurance, Hypomobility, Decreased range of motion, Decreased  scar mobility, Improper body mechanics, Postural dysfunction, Impaired flexibility, Decreased strength, Decreased activity tolerance, Abnormal gait, Decreased balance, Difficulty walking, Increased edema, Impaired perceived functional ability  Visit Diagnosis: Pain in right ankle and joints of right foot  Muscle weakness (generalized)  Ankle weakness  Sprain and strain of ankle     Problem List Patient Active Problem List   Diagnosis Date Noted   H/O of hemilaminectomy 02/15/2019   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[place]) 11/17/2018   Fatigue 09/23/2017   History of pneumonia 04/30/2016   Grief reaction 04/30/2016   Vitamin D deficiency 02/22/2015   Hyperlipidemia 01/01/2015   Visit for preventive health examination 12/04/2013   Lower extremity edema 09/02/2013   Essential hypertension 09/02/2013   History of cardiomyopathy 09/02/2013   SVT (supraventricular tachycardia) ([place]) 09/02/2013   Palpitations 09/02/2013   Contrast media allergy 03/10/2013   Fibrocystic breast disease    Morbid obesity ([place]) 02/05/2012   Meniere disease    Pura Spice, PT, DPT # [id number] Fara Olden SPT 10/02/2020, 11:44 AM  [facility] [address] [place], [place], [address] Phone: [phone]   Fax:  [phone]  Name: LAMONA EIMER MRN: [id number] Date of Birth: 03-14-1965

## 2020-10-04 ENCOUNTER — Other Ambulatory Visit: Payer: Self-pay

## 2020-10-04 ENCOUNTER — Ambulatory Visit: Payer: BC Managed Care – PPO | Admitting: Physical Therapy

## 2020-10-04 ENCOUNTER — Encounter: Payer: Self-pay | Admitting: Physical Therapy

## 2020-10-04 DIAGNOSIS — M6281 Muscle weakness (generalized): Secondary | ICD-10-CM

## 2020-10-04 DIAGNOSIS — M79601 Pain in right arm: Secondary | ICD-10-CM | POA: Diagnosis not present

## 2020-10-04 DIAGNOSIS — M25571 Pain in right ankle and joints of right foot: Secondary | ICD-10-CM

## 2020-10-04 DIAGNOSIS — S93409A Sprain of unspecified ligament of unspecified ankle, initial encounter: Secondary | ICD-10-CM | POA: Diagnosis not present

## 2020-10-04 DIAGNOSIS — M2560 Stiffness of unspecified joint, not elsewhere classified: Secondary | ICD-10-CM | POA: Diagnosis not present

## 2020-10-04 DIAGNOSIS — S96919A Strain of unspecified muscle and tendon at ankle and foot level, unspecified foot, initial encounter: Secondary | ICD-10-CM | POA: Diagnosis not present

## 2020-10-04 DIAGNOSIS — R29898 Other symptoms and signs involving the musculoskeletal system: Secondary | ICD-10-CM | POA: Diagnosis not present

## 2020-10-04 NOTE — Therapy (Signed)
[facility] [address] [place], [place], [address] Phone: [phone]   Fax:  [phone]  Physical Therapy Treatment  Patient Details  Name: Amanda Humphrey MRN: [id number] Date of Birth: 02-20-1965 Referring Provider (PT): Samara Deist, [place]   Encounter Date: 10/04/2020   PT End of Session - 10/04/20 0823     Visit Number 11    Number of Visits 12    Date for PT Re-Evaluation 10/11/20    Authorization - Visit Number 1    Authorization - Number of Visits 10    PT Start Time 0740    PT Stop Time 0821    PT Time Calculation (min) 41 min    Activity Tolerance Patient tolerated treatment well;Patient limited by pain    Behavior During Therapy [facility] for tasks assessed/performed             Past Medical History:  Diagnosis Date   Allergy    Chronic kidney disease    stones   Fibrocystic breast disease 2013   Meniere disease    Migraines     Past Surgical History:  Procedure Laterality Date   ABDOMINAL HYSTERECTOMY  2011   laprascopic supracervical, [place]   APPENDECTOMY  1999   BREAST BIOPSY Right Nov 2012    fibrocystic disease, [place]   CESAREAN SECTION     4 total   CHOLECYSTECTOMY  2011   [place]    There were no vitals filed for this visit.   Subjective Assessment - 10/04/20 0721     Subjective Pt present to tx with 4-5/10 R ankle pain. Pt states that she has noticed increase in pain at the ant medial aspect of the ankle.Pt has work travel this weekend and into next week. Pt is worried about prolonged walking and time in heeled shoes.    Pertinent History MRI results: Rupture of R ant talofibular ligament. History of severe neck pain that causes radiation to the R UE. Cervical Srugery C6-T1(early 2021). History of bilat plantar fasciitis and heel spurs. Pt's spouse has health complication resulting with increased home ADL reponsibilies. Pt enjoys visiting and walking on the  beach, which she is currently unable to do at this time 2/2 ankle instability and pain.    Limitations Lifting;House hold activities    How long can you sit comfortably? WNL    How long can you stand comfortably? WNL with brace, fear of movement without brace.    How long can you walk comfortably? WNL with brace, fear of movement without brace.    Diagnostic tests MRI    Patient Stated Goals Decrease muscle spasms, return to work    Pain Score 5     Pain Location Ankle    Pain Orientation Right;Lateral;Medial    Pain Descriptors / Indicators Aching    Pain Type Chronic pain    Pain Onset More than a month ago    Pain Onset More than a month ago             Treatment   Therapeutic Exercise:  NuStep L 0 and position 12 to minimize DF compression at the top of stroke. 10 mins to facilitate increased blood flow and decrease   // bars with occ touch assist for balance and verbal cueing to ensure proper ankle alignment.  -3 laps of toe walking. Pt states increased pain during movement   Seated posture to facilitate movement without complete weight bearing and verbal/contact  cueing for proper LE alignment and mobility.  1) Pro-stretch DF-PF mobility to facilitate decreased pain post toe walking 2) BAPS board DF and PF x30 each movement.  3) BAPS balancing with ball on board with goals to move the ball from foot to foot without letting the ball fall off.     Manual Therapy:   End of tx: Manual STM with mild-mod tissue pressure to facilitate reduction of swelling in the medial/lateral ankle. Promote decrease medial/lateral ankle sensitivity.  Biofreeze was utilized as a topical analgesic instead of massage cream.  13 mins         PT Education - 10/04/20 0745     Education Details Pt was educated on optimal swelling management during work travel with rest, compression, elevation, and ice.    Person(s) Educated Patient    Methods Explanation    Comprehension Verbalized  understanding                 PT Long Term Goals - 10/02/20 0749       PT LONG TERM GOAL #1   Title Pt. will improve FOTO score to >62 to improve independence with ADLs    Baseline 42 9/6: 57    Time 6    Period Weeks    Status New    Target Date 10/11/20      PT LONG TERM GOAL #2   Title Pt will improve R ankle MMT to qual to L to faciliate increased stability for return to walks on the beach.    Baseline R/L: 3/5 Ankle dorsiflexion    5/25 bilat Sl calf raise Ankle plantarflexion  *pain with all MMT testing of the ankle/foot*  3/5 ankle inversion   3/5 ankle eversion 9/6: R/L: 4+/5 Ankle dorsiflexion    8/25 bilat Sl calf raise Ankle plantarflexion  *pain with all MMT testing of the ankle/foot*  4/5 ankle inversion   4/5 ankle eversion    Time 6    Period Weeks    Status Partially Met    Target Date 10/11/20      PT LONG TERM GOAL #3   Title Pt will be able to complete 20 steps with R ankle pain >3/10 to facilitate greater mobility within the home.    Baseline 7-8/10 NPS 9/6: 2/10 at the lowest. 8 at the worse. Stair climbing is very difficult    Time 6    Period Weeks    Status New    Target Date 10/11/20      PT LONG TERM GOAL #4   Title Pt will be able to complete work/home mobility taks with laced ankle braced doff >50% for return to work specific foot wear.    Baseline Pt current needs to wear high cushion tennis shoes to work, brace donned 100% 9/6: laced brace doffed during ADLs the last 4 days. PT will wear brace during long work days.    Time 6    Period Weeks    Status Partially Met    Target Date 10/11/20                Plan - 10/04/20 0725     Clinical Impression Statement Today's tx was focused on increased stability in standing with toe walking in the bars. Pt was able to complete the task with good alignment and strength, however, had a marked increase in pain post movement.  Pt continues to display mark LE endurance, strength, and ROM deficits  resulting in decreased safe home/community  mobility, increased pain, and limited access to QOL. Pt. will continue to benefit from skilled physical therapy to progress POC to address remaining deficits to facilitate maximum functional capacity for optimal personal health and wellness for ADLs.    Personal Factors and Comorbidities Comorbidity 2;Profession;Past/Current Experience    Comorbidities Hypertension, Obesity    Examination-Activity Limitations Squat;Locomotion Level    Examination-Participation Restrictions Occupation;Cleaning;Laundry    Stability/Clinical Decision Making Evolving/Moderate complexity    Clinical Decision Making Moderate    Rehab Potential Good    PT Frequency 2x / week    PT Duration 6 weeks    PT Treatment/Interventions ADLs/Self Care Home Management;Aquatic Therapy;Biofeedback;Cryotherapy;Electrical Stimulation;Moist Heat;Ultrasound;Functional mobility training;Therapeutic activities;Therapeutic exercise;Balance training;Neuromuscular re-education;Patient/family education;Manual techniques;Scar mobilization;Passive range of motion;Dry needling;Energy conservation;Taping;Gait training;Stair training;DME Instruction;Spinal Manipulations;Joint Manipulations    PT Next Visit Plan Reassess sx reponse during travel.    PT Home Exercise Plan [id number]    Consulted and Agree with Plan of Care Patient             Patient will benefit from skilled therapeutic intervention in order to improve the following deficits and impairments:  Decreased mobility, Decreased endurance, Hypomobility, Decreased range of motion, Decreased scar mobility, Improper body mechanics, Postural dysfunction, Impaired flexibility, Decreased strength, Decreased activity tolerance, Abnormal gait, Decreased balance, Difficulty walking, Increased edema, Impaired perceived functional ability  Visit Diagnosis: Pain in right ankle and joints of right foot  Muscle weakness (generalized)  Ankle  weakness  Sprain and strain of ankle     Problem List Patient Active Problem List   Diagnosis Date Noted   H/O of hemilaminectomy 02/15/2019   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[place]) 11/17/2018   Fatigue 09/23/2017   History of pneumonia 04/30/2016   Grief reaction 04/30/2016   Vitamin D deficiency 02/22/2015   Hyperlipidemia 01/01/2015   Visit for preventive health examination 12/04/2013   Lower extremity edema 09/02/2013   Essential hypertension 09/02/2013   History of cardiomyopathy 09/02/2013   SVT (supraventricular tachycardia) ([place]) 09/02/2013   Palpitations 09/02/2013   Contrast media allergy 03/10/2013   Fibrocystic breast disease    Morbid obesity ([place]) 02/05/2012   Meniere disease    Pura Spice, PT, DPT # [id number] Fara Olden, SPT 10/04/2020, 8:49 AM  [facility] [address] [place], [place], [address] Phone: [phone]   Fax:  [phone]  Name: Amanda Humphrey MRN: [id number] Date of Birth: [birth year]

## 2020-10-16 ENCOUNTER — Encounter: Payer: Self-pay | Admitting: Physical Therapy

## 2020-10-16 ENCOUNTER — Other Ambulatory Visit: Payer: Self-pay

## 2020-10-16 ENCOUNTER — Ambulatory Visit: Payer: BC Managed Care – PPO | Admitting: Physical Therapy

## 2020-10-16 DIAGNOSIS — S93409A Sprain of unspecified ligament of unspecified ankle, initial encounter: Secondary | ICD-10-CM | POA: Diagnosis not present

## 2020-10-16 DIAGNOSIS — S96919A Strain of unspecified muscle and tendon at ankle and foot level, unspecified foot, initial encounter: Secondary | ICD-10-CM

## 2020-10-16 DIAGNOSIS — R29898 Other symptoms and signs involving the musculoskeletal system: Secondary | ICD-10-CM

## 2020-10-16 DIAGNOSIS — M6281 Muscle weakness (generalized): Secondary | ICD-10-CM | POA: Diagnosis not present

## 2020-10-16 DIAGNOSIS — M25571 Pain in right ankle and joints of right foot: Secondary | ICD-10-CM | POA: Diagnosis not present

## 2020-10-16 DIAGNOSIS — M79601 Pain in right arm: Secondary | ICD-10-CM | POA: Diagnosis not present

## 2020-10-16 DIAGNOSIS — M2560 Stiffness of unspecified joint, not elsewhere classified: Secondary | ICD-10-CM | POA: Diagnosis not present

## 2020-10-16 NOTE — Therapy (Signed)
[facility] [address] [place], [place], [address] Phone: [phone]   Fax:  [phone]  Physical Therapy Treatment  Patient Details  Name: Amanda Humphrey MRN: [id number] Date of Birth: 01/29/1965 Referring Provider (PT): Samara Deist, [place]   Encounter Date: 10/16/2020   PT End of Session - 10/16/20 0742     Visit Number 12    Number of Visits 24    Date for PT Re-Evaluation 11/27/20    Authorization - Visit Number 2    Authorization - Number of Visits 10    PT Start Time 1224    PT Stop Time 0815    PT Time Calculation (min) 39 min    Activity Tolerance Patient tolerated treatment well;Patient limited by pain    Behavior During Therapy [facility] for tasks assessed/performed             Past Medical History:  Diagnosis Date   Allergy    Chronic kidney disease    stones   Fibrocystic breast disease 2013   Meniere disease    Migraines     Past Surgical History:  Procedure Laterality Date   ABDOMINAL HYSTERECTOMY  2011   laprascopic supracervical, [place]   APPENDECTOMY  1999   BREAST BIOPSY Right Nov 2012    fibrocystic disease, [place]   CESAREAN SECTION     4 total   CHOLECYSTECTOMY  2011   [place]    There were no vitals filed for this visit.   Subjective Assessment - 10/17/20 1059     Subjective Pt. was out of town last week in [place] for work.  Pt. states she did a lot of walking and required ankle brace after first couple of days due to increase R ankle pain/ swelling.  Pt. states she was able to elevate/ rest ankle occasionally during trip.  Pt. entered PT with no compression sleeve or brace today.    Pertinent History MRI results: Rupture of R ant talofibular ligament. History of severe neck pain that causes radiation to the R UE. Cervical Srugery C6-T1(early 2021). History of bilat plantar fasciitis and heel spurs. Pt's spouse has health complication resulting with  increased home ADL reponsibilies. Pt enjoys visiting and walking on the beach, which she is currently unable to do at this time 2/2 ankle instability and pain.    Limitations Lifting;House hold activities    How long can you sit comfortably? WNL    How long can you stand comfortably? WNL with brace, fear of movement without brace.    How long can you walk comfortably? WNL with brace, fear of movement without brace.    Diagnostic tests MRI    Patient Stated Goals Decrease muscle spasms, return to work    Currently in Pain? Yes    Pain Score 5     Pain Location Ankle    Pain Orientation Right;Lateral;Medial    Pain Descriptors / Indicators Aching    Pain Type Chronic pain    Pain Onset More than a month ago    Pain Onset More than a month ago                [facility] PT Assessment - 10/17/20 0001       Assessment   Medical Diagnosis Anterior talofibular ligament sprain    Referring Provider (PT) Samara Deist, DPM    Onset Date/Surgical Date 07/21/20    Hand Dominance Right    Prior Therapy Tx  for cervical pain.      Restrictions   Weight Bearing Restrictions No      Home [employer] residence      Prior Function   Level of Independence Independent      Cognition   Overall Cognitive Status Within Functional Limits for tasks assessed                Pt. Reports she had an increase in R ankle pain while in [place] due to increase activity/ walking.  Pt. States she had to wear ankle brace during trip due to increase ankle pain (8/10 at worst).  Pt. Reports 5/10 R ankle pain currently at rest.       Treatment    Therapeutic Exercise:   NuStep L5 and position 10-11 to minimize DF compression at the top of stroke. 10 mins to facilitate increased blood flow and decrease.    Walking in //-bars (forward/backwards/lateral)- cuing to increase heel strike/ toe off 3x each.     Supine R ankle manual isometrics: DF/PF/IV/EV 10x each with  static holds (pt. Instructed to avoid increase lateral ankle pain).   R ankle circles (CW/CCW)   Manual Therapy:   End of tx: Manual STM with mild-mod tissue pressure to facilitate reduction of swelling in the medial/lateral ankle. Promote decrease medial/lateral ankle sensitivity.    Gentle DF/PF stretches with holds (pain tolerable range)- 5x each.         PT Long Term Goals - 10/17/20 1436       PT LONG TERM GOAL #1   Title Pt. will improve FOTO score to >62 to improve independence with ADLs    Baseline 42 9/6: 57    Time 6    Period Weeks    Status On-going    Target Date 11/27/20      PT LONG TERM GOAL #2   Title Pt will improve R ankle MMT to qual to L to faciliate increased stability for return to walks on the beach.    Baseline R/L: 3/5 Ankle dorsiflexion    5/25 bilat Sl calf raise Ankle plantarflexion  *pain with all MMT testing of the ankle/foot*  3/5 ankle inversion   3/5 ankle eversion 9/6: R/L: 4+/5 Ankle dorsiflexion    8/25 bilat Sl calf raise Ankle plantarflexion  *pain with all MMT testing of the ankle/foot*  4/5 ankle inversion   4/5 ankle eversion    Time 6    Period Weeks    Status Partially Met    Target Date 11/27/20      PT LONG TERM GOAL #3   Title Pt will be able to complete 20 steps with R ankle pain >3/10 to facilitate greater mobility within the home.    Baseline 7-8/10 NPS 9/6: 2/10 at the lowest. 8 at the worse. Stair climbing is very difficult    Time 6    Period Weeks    Status Partially Met    Target Date 11/27/20      PT LONG TERM GOAL #4   Title Pt will be able to complete work/home mobility tasks with laced ankle braced doff >50% for return to work specific foot wear.    Baseline Pt current needs to wear high cushion tennis shoes to work, brace donned 100% 9/6: laced brace doffed during ADLs the last 4 days. PT will wear brace during long work days.    Time 6    Period Weeks    Status Partially  Met    Target Date 11/27/20                    Plan - 10/17/20 1108     Clinical Impression Statement Pain limited in R ankle with all MMT in supine position: 4/5 ankle inversion, 4/5 ankle eversion, 4+/5 MMT DF.  Amb function: NPS 2/10 at the lowest. 8 at the worse. Stair climbing is very difficult and painful. Pt. still requires use of ankle brace on R when increase mobility/ pain.  See updated goals.  Pt continues to display mark LE endurance, strength, and ROM deficits resulting in decreased safe home/community mobiity, increased pain, and limited access to QOL. Pt. will continue to benefit from skilled physical therapy to progress POC to address remaining deficits to facilitate maximum functional capacity for optimal personal health and wellness for ADLs.    Personal Factors and Comorbidities Comorbidity 2;Profession;Past/Current Experience    Comorbidities Hypertension, Obesity    Examination-Activity Limitations Squat;Locomotion Level    Examination-Participation Restrictions Occupation;Cleaning;Laundry    Stability/Clinical Decision Making Evolving/Moderate complexity    Clinical Decision Making Moderate    Rehab Potential Good    PT Frequency 2x / week    PT Duration 6 weeks    PT Treatment/Interventions ADLs/Self Care Home Management;Aquatic Therapy;Biofeedback;Cryotherapy;Electrical Stimulation;Moist Heat;Ultrasound;Functional mobility training;Therapeutic activities;Therapeutic exercise;Balance training;Neuromuscular re-education;Patient/family education;Manual techniques;Scar mobilization;Passive range of motion;Dry needling;Energy conservation;Taping;Gait training;Stair training;DME Instruction;Spinal Manipulations;Joint Manipulations    PT Next Visit Plan Progress R ankle stability/ functional mobility.    PT Home Exercise Plan [id number]    Consulted and Agree with Plan of Care Patient             Patient will benefit from skilled therapeutic intervention in order to improve the following deficits and  impairments:  Decreased mobility, Decreased endurance, Hypomobility, Decreased range of motion, Decreased scar mobility, Improper body mechanics, Postural dysfunction, Impaired flexibility, Decreased strength, Decreased activity tolerance, Abnormal gait, Decreased balance, Difficulty walking, Increased edema, Impaired perceived functional ability  Visit Diagnosis: Pain in right ankle and joints of right foot  Muscle weakness (generalized)  Ankle weakness  Sprain and strain of ankle     Problem List Patient Active Problem List   Diagnosis Date Noted   H/O of hemilaminectomy 02/15/2019   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[place]) 11/17/2018   Fatigue 09/23/2017   History of pneumonia 04/30/2016   Grief reaction 04/30/2016   Vitamin D deficiency 02/22/2015   Hyperlipidemia 01/01/2015   Visit for preventive health examination 12/04/2013   Lower extremity edema 09/02/2013   Essential hypertension 09/02/2013   History of cardiomyopathy 09/02/2013   SVT (supraventricular tachycardia) ([place]) 09/02/2013   Palpitations 09/02/2013   Contrast media allergy 03/10/2013   Fibrocystic breast disease    Morbid obesity ([place]) 02/05/2012   Meniere disease    Pura Spice, PT, DPT # [phone] 10/17/2020, 2:37 PM  [facility] [address] [place], [place], [address] Phone: [phone]   Fax:  [phone]  Name: Amanda Humphrey MRN: [id number] Date of Birth: 04-11-1965

## 2020-10-18 ENCOUNTER — Ambulatory Visit: Payer: BC Managed Care – PPO | Admitting: Physical Therapy

## 2020-10-18 ENCOUNTER — Encounter: Payer: Self-pay | Admitting: Physical Therapy

## 2020-10-18 DIAGNOSIS — M2560 Stiffness of unspecified joint, not elsewhere classified: Secondary | ICD-10-CM | POA: Diagnosis not present

## 2020-10-18 DIAGNOSIS — M6281 Muscle weakness (generalized): Secondary | ICD-10-CM

## 2020-10-18 DIAGNOSIS — M79601 Pain in right arm: Secondary | ICD-10-CM | POA: Diagnosis not present

## 2020-10-18 DIAGNOSIS — S96919A Strain of unspecified muscle and tendon at ankle and foot level, unspecified foot, initial encounter: Secondary | ICD-10-CM | POA: Diagnosis not present

## 2020-10-18 DIAGNOSIS — S93409A Sprain of unspecified ligament of unspecified ankle, initial encounter: Secondary | ICD-10-CM | POA: Diagnosis not present

## 2020-10-18 DIAGNOSIS — M25571 Pain in right ankle and joints of right foot: Secondary | ICD-10-CM

## 2020-10-18 DIAGNOSIS — R29898 Other symptoms and signs involving the musculoskeletal system: Secondary | ICD-10-CM | POA: Diagnosis not present

## 2020-10-18 NOTE — Therapy (Signed)
[facility] [address] [place], [place], [address] Phone: [phone]   Fax:  [phone]  Physical Therapy Treatment  Patient Details  Name: Amanda Humphrey MRN: [id number] Date of Birth: 1965-11-10 Referring Provider (PT): Samara Deist, [place]   Encounter Date: 10/18/2020   PT End of Session - 10/18/20 0959     Visit Number 13    Number of Visits 24    Date for PT Re-Evaluation 11/27/20    Authorization - Visit Number 3    Authorization - Number of Visits 10    PT Start Time 0742    PT Stop Time 0820    PT Time Calculation (min) 38 min    Activity Tolerance Patient tolerated treatment well;Patient limited by pain    Behavior During Therapy [facility] for tasks assessed/performed             Past Medical History:  Diagnosis Date   Allergy    Chronic kidney disease    stones   Fibrocystic breast disease 2013   Meniere disease    Migraines     Past Surgical History:  Procedure Laterality Date   ABDOMINAL HYSTERECTOMY  2011   laprascopic supracervical, [place]   APPENDECTOMY  1999   BREAST BIOPSY Right Nov 2012    fibrocystic disease, [place]   CESAREAN SECTION     4 total   CHOLECYSTECTOMY  2011   [place]    There were no vitals filed for this visit.   Subjective Assessment - 10/18/20 0956     Subjective Pt present to tx with 4/10 R ankle pain. Pt states marked increase in pain and swelling post last tx. Pt was able to stand for ~2 hours during company outing.    Pertinent History MRI results: Rupture of R ant talofibular ligament. History of severe neck pain that causes radiation to the R UE. Cervical Srugery C6-T1(early 2021). History of bilat plantar fasciitis and heel spurs. Pt's spouse has health complication resulting with increased home ADL reponsibilies. Pt enjoys visiting and walking on the beach, which she is currently unable to do at this time 2/2 ankle instability and pain.     Limitations Lifting;House hold activities    How long can you sit comfortably? WNL    How long can you stand comfortably? WNL with brace, fear of movement without brace.    How long can you walk comfortably? WNL with brace, fear of movement without brace.    Diagnostic tests MRI    Patient Stated Goals Decrease muscle spasms, return to work    Currently in Pain? Yes    Pain Score 4     Pain Location Ankle    Pain Orientation Right;Medial;Lateral    Pain Descriptors / Indicators Aching    Pain Type Chronic pain    Pain Onset More than a month ago    Pain Onset More than a month ago              Treatment  Therapeutic Exercise:   SciFit 6 mins on L2 with verbal cueing to allow heel to raise off pedal to not facilitate increase pain with DF.      TG on L 15 for reduction in WB during squatting and calf raises. Verbal and contact cueing to facilitate optimal tissue loading and correct biomechanical alignment to ensure efficient and safe movement.   1) squat x30  2) calf raise x30   // Bars SBA  with occ touch assist to promote pt confidence during movement 2/2 to fear of movement due to severe pain. 3 laps each movement.  1) lateral stepping  2) Tandem (heel-toe walking)   Manual Therapy:   End of tx: Manual STM with mild-mod tissue pressure to facilitate reduction of swelling in the medial/lateral ankle. Promote decrease medial/lateral ankle sensitivity.  Biofreeze was utilized as a topical analgesic instead of massage cream.  12 mins            PT Education - 10/18/20 0958     Education Details Pt was educated the importance of stability training for greater functional gains of the R ankle.    Person(s) Educated Patient    Methods Explanation    Comprehension Verbalized understanding;Need further instruction                 PT Long Term Goals - 10/17/20 1436       PT LONG TERM GOAL #1   Title Pt. will improve FOTO score to >62 to improve  independence with ADLs    Baseline 42 9/6: 57    Time 6    Period Weeks    Status On-going    Target Date 11/27/20      PT LONG TERM GOAL #2   Title Pt will improve R ankle MMT to qual to L to faciliate increased stability for return to walks on the beach.    Baseline R/L: 3/5 Ankle dorsiflexion    5/25 bilat Sl calf raise Ankle plantarflexion  *pain with all MMT testing of the ankle/foot*  3/5 ankle inversion   3/5 ankle eversion 9/6: R/L: 4+/5 Ankle dorsiflexion    8/25 bilat Sl calf raise Ankle plantarflexion  *pain with all MMT testing of the ankle/foot*  4/5 ankle inversion   4/5 ankle eversion    Time 6    Period Weeks    Status Partially Met    Target Date 11/27/20      PT LONG TERM GOAL #3   Title Pt will be able to complete 20 steps with R ankle pain >3/10 to facilitate greater mobility within the home.    Baseline 7-8/10 NPS 9/6: 2/10 at the lowest. 8 at the worse. Stair climbing is very difficult    Time 6    Period Weeks    Status Partially Met    Target Date 11/27/20      PT LONG TERM GOAL #4   Title Pt will be able to complete work/home mobility tasks with laced ankle braced doff >50% for return to work specific foot wear.    Baseline Pt current needs to wear high cushion tennis shoes to work, brace donned 100% 9/6: laced brace doffed during ADLs the last 4 days. PT will wear brace during long work days.    Time 6    Period Weeks    Status Partially Met    Target Date 11/27/20                   Plan - 10/18/20 0959     Clinical Impression Statement Today's tx was focused on addition of TD for greater strength gains in a WB reduced environment with stability training in the // bars. Pt stated increased pain to 7/10 post therex with reduced to 4/10 post manual intervention. Pt continues to display mark UE endurance, strength, and ROM deficits resulting in decreased safe home/community mobiity, increased pain, and limited access to QOL. Pt will continue to  benefit from skilled physical therapy to progress POC to address remaining deficits to facilitate maximum functional capacity for optimal personal health and wellness for ADLs.    Personal Factors and Comorbidities Comorbidity 2;Profession;Past/Current Experience    Comorbidities Hypertension, Obesity    Examination-Activity Limitations Squat;Locomotion Level    Examination-Participation Restrictions Occupation;Cleaning;Laundry    Stability/Clinical Decision Making Evolving/Moderate complexity    Clinical Decision Making Moderate    Rehab Potential Good    PT Frequency 2x / week    PT Duration 6 weeks    PT Treatment/Interventions ADLs/Self Care Home Management;Aquatic Therapy;Biofeedback;Cryotherapy;Electrical Stimulation;Moist Heat;Ultrasound;Functional mobility training;Therapeutic activities;Therapeutic exercise;Balance training;Neuromuscular re-education;Patient/family education;Manual techniques;Scar mobilization;Passive range of motion;Dry needling;Energy conservation;Taping;Gait training;Stair training;DME Instruction;Spinal Manipulations;Joint Manipulations    PT Next Visit Plan Progress R ankle stability/ functional mobility.    PT Home Exercise Plan [id number]    Consulted and Agree with Plan of Care Patient             Patient will benefit from skilled therapeutic intervention in order to improve the following deficits and impairments:  Decreased mobility, Decreased endurance, Hypomobility, Decreased range of motion, Decreased scar mobility, Improper body mechanics, Postural dysfunction, Impaired flexibility, Decreased strength, Decreased activity tolerance, Abnormal gait, Decreased balance, Difficulty walking, Increased edema, Impaired perceived functional ability  Visit Diagnosis: Pain in right ankle and joints of right foot  Joint stiffness of spine  Pain in right arm  Muscle weakness (generalized)  Ankle weakness  Sprain and strain of ankle     Problem  List Patient Active Problem List   Diagnosis Date Noted   H/O of hemilaminectomy 02/15/2019   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[place]) 11/17/2018   Fatigue 09/23/2017   History of pneumonia 04/30/2016   Grief reaction 04/30/2016   Vitamin D deficiency 02/22/2015   Hyperlipidemia 01/01/2015   Visit for preventive health examination 12/04/2013   Lower extremity edema 09/02/2013   Essential hypertension 09/02/2013   History of cardiomyopathy 09/02/2013   SVT (supraventricular tachycardia) ([place]) 09/02/2013   Palpitations 09/02/2013   Contrast media allergy 03/10/2013   Fibrocystic breast disease    Morbid obesity ([place]) 02/05/2012   Meniere disease    Pura Spice, PT, DPT # [id number] Fara Olden, SPT 10/18/2020, 10:55 AM  [place] [facility] [address] [place], [place], [address] Phone: [phone]   Fax:  [phone]  Name: DASIA GUERRIER MRN: [id number] Date of Birth: Aug 04, 1965

## 2020-10-22 ENCOUNTER — Ambulatory Visit
Admission: RE | Admit: 2020-10-22 | Discharge: 2020-10-22 | Disposition: A | Discharge status: Home / Self Care | Payer: BC Managed Care – PPO | Source: Ambulatory Visit | Attending: Internal Medicine | Admitting: Internal Medicine

## 2020-10-22 ENCOUNTER — Other Ambulatory Visit: Payer: Self-pay

## 2020-10-22 VITALS — BP 138/80 | HR 72 | Temp 98.1°F | Resp 18

## 2020-10-22 DIAGNOSIS — M5010 Cervical disc disorder with radiculopathy, unspecified cervical region: Secondary | ICD-10-CM | POA: Diagnosis not present

## 2020-10-22 MED ORDER — PREDNISONE 10 MG (21) PO TBPK: ORAL_TABLET | Freq: Every day | ORAL | 0 refills | Status: DC

## 2020-10-22 NOTE — ED Triage Notes (Addendum)
Pt presents today with c/o of neck pain and left arm pain that began when she woke up Saturday morning. She feels like it might be a pinched nerve. She had laminectomy 1.5 yrs ago for c6,7&T1. She called PCP who told her to come to [facility] for possible steroid taper RX.

## 2020-10-22 NOTE — ED Provider Notes (Signed)
[facility]    CSN: [id number] Arrival date & time: 10/22/20  1544      History   Chief Complaint Chief Complaint  Patient presents with   Neck Pain   Extremity Pain    left    HPI Amanda Humphrey is a 55 y.o. female with a history of cervical radiculopathy status postlaminectomy of C6, 7 and T1 comes to the urgent care with left upper back/neck pain radiating to the left arm.  Symptoms started about a week ago and has been persistent.  Is associated with some numbness and tingling in the left fingers.  Patient has tried muscle relaxants, ibuprofen and heating pad with no improvement in her symptoms.  She recently traveled and carried her traveling back on the left shoulder throughout the flight.  No headaches, dizziness, syncope or near syncopal episodes.  No lower extremity weakness or numbness.  No visual problems. 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   Fatigue 09/23/2017   History of pneumonia 04/30/2016   Grief reaction 04/30/2016   Vitamin D deficiency 02/22/2015   Hyperlipidemia 01/01/2015   Visit for preventive health examination 12/04/2013   Lower extremity edema 09/02/2013   Essential hypertension 09/02/2013   History of cardiomyopathy 09/02/2013   SVT (supraventricular tachycardia) ([facility]) 09/02/2013   Palpitations 09/02/2013   Contrast media allergy 03/10/2013   Fibrocystic breast disease    Morbid obesity ([facility]) 02/05/2012   Meniere disease     Past Surgical History:  Procedure Laterality Date   ABDOMINAL HYSTERECTOMY  2011   laprascopic supracervical, [place]   APPENDECTOMY  1999   BREAST BIOPSY Right Nov 2012    fibrocystic disease, [place]   CESAREAN SECTION     4 total    CHOLECYSTECTOMY  2011   TONSILLECTOMY AND ADENOIDECTOMY  1975    OB History   No obstetric history on file.      Home Medications    Prior to Admission medications   Medication Sig Start Date End Date Taking? Authorizing Provider  predniSONE (STERAPRED UNI-PAK 21 TAB) 10 MG (21) TBPK tablet Take by mouth daily. Take 6 tabs by mouth daily  for 2 days, then 5 tabs for 2 days, then 4 tabs for 2 days, then 3 tabs for 2 days, 2 tabs for 2 days, then 1 tab by mouth daily for 2 days 10/22/20  Yes Delois Tolbert, Britta Mccreedy, MD  amphetamine-dextroamphetamine (ADDERALL) 20 MG tablet Take 20 mg by mouth every evening.  09/22/18   [provider]  benzonatate (TESSALON) 100 MG capsule Take 1 capsule (100 mg total) by mouth every 8 (eight) hours. 01/16/20   Delton See, MD  carvedilol (COREG) 6.25 MG tablet TAKE 1 TABLET TWICE A DAY 08/20/20   Antonieta Iba, MD  cyclobenzaprine (FLEXERIL) 5 MG tablet Take 5 mg by mouth at bedtime. 04/28/19   [provider]  ezetimibe (ZETIA) 10 MG tablet Take 1 tablet (10 mg total) by mouth daily. 08/20/20   Antonieta Iba, MD  furosemide (LASIX) 20 MG tablet Take 1 tablet (20 mg total) by mouth daily as needed. 08/20/20   Antonieta Iba, MD  hydrochlorothiazide (HYDRODIURIL) 25 MG  tablet Take 1 tablet (25 mg total) by mouth daily. 08/20/20   Antonieta Iba, MD  methocarbamol (ROBAXIN) 500 MG tablet TAKE 1 TABLET TWICE A DAY 03/07/19   Sherlene Shams, MD  omeprazole (PRILOSEC) 40 MG capsule TAKE 1 CAPSULE DAILY 08/17/20   Sherlene Shams, MD  ondansetron (ZOFRAN ODT) 4 MG disintegrating tablet Take 1 tablet (4 mg total) by mouth every 8 (eight) hours as needed for nausea or vomiting. 04/10/20   Waldon Merl, PA-C  rosuvastatin (CRESTOR) 5 MG tablet Take 1 tablet (5 mg total) by mouth daily. 08/20/20   Antonieta Iba, MD    Family History Family History  Problem Relation Age of Onset   Hypertension Mother    Cancer Mother        ovarian    Arthritis Mother    Hyperlipidemia Father    Hypertension Father    Diabetes Father    Stroke Father    Alzheimer's disease Maternal Grandmother    Heart disease Maternal Grandmother    Breast cancer Maternal Grandmother    Alzheimer's disease Paternal Grandmother    Breast cancer Paternal Grandmother     Social History Social History   Tobacco Use   Smoking status: Never   Smokeless tobacco: Never  Vaping Use   Vaping Use: Never used  Substance Use Topics   Alcohol use: Yes    Comment: social   Drug use: No     Allergies   Bee venom, Codeine, Hydrocodone, Tramadol, and Oxycodone   Review of Systems Review of Systems  HENT: Negative.    Respiratory: Negative.    Musculoskeletal:  Positive for arthralgias and myalgias. Negative for back pain, gait problem and joint swelling.  Neurological:  Negative for headaches.    Physical Exam Triage Vital Signs ED Triage Vitals  Enc Vitals Group     BP 10/22/20 1635 138/80     Pulse Rate 10/22/20 1635 72     Resp 10/22/20 1635 18     Temp 10/22/20 1635 98.1 F (36.7 C)     Temp Source 10/22/20 1635 Oral     SpO2 10/22/20 1635 98 %     Weight --      Height --      Head Circumference --      Peak Flow --      Pain Score 10/22/20 1632 5     Pain Loc --      Pain Edu? --      Excl. in GC? --    No data found.  Updated Vital Signs BP 138/80 (BP Location: Right Arm)   Pulse 72   Temp 98.1 F (36.7 C) (Oral)   Resp 18   SpO2 98%   Visual Acuity Right Eye Distance:   Left Eye Distance:   Bilateral Distance:    Right Eye Near:   Left Eye Near:    Bilateral Near:     Physical Exam Vitals and nursing note reviewed.  Constitutional:      General: She is not in acute distress.    Appearance: She is not ill-appearing.  Cardiovascular:     Rate and Rhythm: Normal rate and regular rhythm.     Pulses: Normal pulses.     Heart sounds: Normal heart sounds.  Pulmonary:     Effort: Pulmonary effort is normal.      Breath sounds: Normal breath sounds.  Musculoskeletal:        General: No swelling or  tenderness. Normal range of motion.  Neurological:     General: No focal deficit present.     Mental Status: She is alert and oriented to person, place, and time.     Cranial Nerves: No cranial nerve deficit.     Sensory: No sensory deficit.     Motor: No weakness.     Coordination: Coordination normal.     UC Treatments / Results  Labs (all labs ordered are listed, but only abnormal results are displayed) Labs Reviewed - No data to display  EKG   Radiology No results found.  Procedures Procedures (including critical care time)  Medications Ordered in UC Medications - No data to display  Initial Impression / Assessment and Plan / UC Course  I have reviewed the triage vital signs and the nursing notes.  Pertinent labs & imaging results that were available during my care of the patient were reviewed by me and considered in my medical decision making (see chart for details).     1.  Cervical radiculopathy: Tapering dose prednisone Continue muscle relaxant use Hold off using NSAIDs while taking prednisone Take Tylenol as needed while still on prednisone Continue heating pad use Follow-up with primary care physician If symptoms worsen please return to urgent care to be reevaluated. Final Clinical Impressions(s) / UC Diagnoses   Final diagnoses:  Cervical disc disorder with radiculopathy of cervical region     Discharge Instructions      Gentle range of motion exercises Hold off taking ibuprofen while she is taking steroids Continue heating pad use on a 30 minutes on-30 minutes off cycle Take medications as prescribed Follow-up with primary care physician.    ED Prescriptions     Medication Sig Dispense Auth. Provider   predniSONE (STERAPRED UNI-PAK 21 TAB) 10 MG (21) TBPK tablet Take by mouth daily. Take 6 tabs by mouth daily  for 2 days, then 5 tabs for 2 days, then 4  tabs for 2 days, then 3 tabs for 2 days, 2 tabs for 2 days, then 1 tab by mouth daily for 2 days 42 tablet Taurean Ju, Britta Mccreedy, MD      PDMP not reviewed this encounter.   Merrilee Jansky, MD 10/22/20 1710

## 2020-10-22 NOTE — Discharge Instructions (Addendum)
Gentle range of motion exercises Hold off taking ibuprofen while she is taking steroids Continue heating pad use on a 30 minutes on-30 minutes off cycle Take medications as prescribed Follow-up with primary care physician.

## 2020-10-23 ENCOUNTER — Encounter: Payer: BC Managed Care – PPO | Admitting: Physical Therapy

## 2020-10-25 ENCOUNTER — Ambulatory Visit: Payer: BC Managed Care – PPO | Admitting: Physical Therapy

## 2020-10-25 ENCOUNTER — Encounter: Payer: Self-pay | Admitting: Physical Therapy

## 2020-10-25 ENCOUNTER — Other Ambulatory Visit: Payer: Self-pay

## 2020-10-25 DIAGNOSIS — M2560 Stiffness of unspecified joint, not elsewhere classified: Secondary | ICD-10-CM

## 2020-10-25 DIAGNOSIS — M79601 Pain in right arm: Secondary | ICD-10-CM | POA: Diagnosis not present

## 2020-10-25 DIAGNOSIS — S96919A Strain of unspecified muscle and tendon at ankle and foot level, unspecified foot, initial encounter: Secondary | ICD-10-CM | POA: Diagnosis not present

## 2020-10-25 DIAGNOSIS — M6281 Muscle weakness (generalized): Secondary | ICD-10-CM | POA: Diagnosis not present

## 2020-10-25 DIAGNOSIS — M25571 Pain in right ankle and joints of right foot: Secondary | ICD-10-CM | POA: Diagnosis not present

## 2020-10-25 DIAGNOSIS — S93409A Sprain of unspecified ligament of unspecified ankle, initial encounter: Secondary | ICD-10-CM | POA: Diagnosis not present

## 2020-10-25 DIAGNOSIS — R29898 Other symptoms and signs involving the musculoskeletal system: Secondary | ICD-10-CM | POA: Diagnosis not present

## 2020-10-25 NOTE — Therapy (Signed)
[facility] [address] [place], [place], [address] Phone: [phone]   Fax:  [phone]  Physical Therapy Treatment  Patient Details  Name: Amanda Humphrey MRN: [id number] Date of Birth: 08/03/1965 Referring Provider (PT): Samara Deist, [place]   Encounter Date: 10/25/2020   PT End of Session - 10/25/20 0837     Visit Number 14    Number of Visits 24    Date for PT Re-Evaluation 11/27/20    Authorization - Visit Number 4    Authorization - Number of Visits 10    PT Start Time 3662    PT Stop Time 0831    PT Time Calculation (min) 50 min    Activity Tolerance Patient tolerated treatment well;Patient limited by pain    Behavior During Therapy [facility] for tasks assessed/performed             Past Medical History:  Diagnosis Date   Allergy    Chronic kidney disease    stones   Fibrocystic breast disease 2013   Meniere disease    Migraines     Past Surgical History:  Procedure Laterality Date   ABDOMINAL HYSTERECTOMY  2011   laprascopic supracervical, [place]   APPENDECTOMY  1999   BREAST BIOPSY Right Nov 2012    fibrocystic disease, [place]   CESAREAN SECTION     4 total   CHOLECYSTECTOMY  2011   [place]    There were no vitals filed for this visit.   Subjective Assessment - 10/25/20 0742     Subjective Pt presents to PT with a soft cervical collar and ED visit. Pt is currently on steroid taper to reduction in possible nerve inflammation. Pt reporting sx include LUE throbbing pain, thenar spasms, and severe L rhomboid tightness and pain. Pt is going to f/u with her neurologist as soon as possible.  Pt. states the oral steriod has been helping foot/ankle pain.    Pertinent History MRI results: Rupture of R ant talofibular ligament. History of severe neck pain that causes radiation to the R UE. Cervical Srugery C6-T1(early 2021). History of bilat plantar fasciitis and heel spurs. Pt's  spouse has health complication resulting with increased home ADL reponsibilies. Pt enjoys visiting and walking on the beach, which she is currently unable to do at this time 2/2 ankle instability and pain.    Limitations Lifting;House hold activities    How long can you sit comfortably? WNL    How long can you stand comfortably? WNL with brace, fear of movement without brace.    How long can you walk comfortably? WNL with brace, fear of movement without brace.    Diagnostic tests MRI    Patient Stated Goals Decrease muscle spasms, return to work    Currently in Pain? Yes    Pain Score 4     Pain Location Neck    Pain Orientation Left    Pain Descriptors / Indicators Aching;Throbbing    Pain Type Acute pain    Pain Onset More than a month ago    Pain Onset More than a month ago                Treatment  Manual Therapy:   Pt placed in supine with HOB placed for pt comfort. Combination of manual cervical traction, STM to cervical paraspinals (L>R), nerve glides, STM to superior rhomboid with shoulder in IR/HBB to allow access to subscapularis. 46 mins. Pt states slight  reduction in cervical neck pain without reduction in superior rhomboid pain.   *Pt was educated on pulley's and chin tucks for in office and home sx management. Pt stated " good stretch" during end range L shoulder flexion.   Reassess ankle/ foot pain and limitations.  Pt. Will continue with current HEP this weekend.         PT Education - 10/25/20 0836     Education Details Pt was educated on pulley and chin-tuck exercise to sx management.    Methods Explanation;Demonstration;Tactile cues    Comprehension Verbalized understanding;Returned demonstration                 PT Long Term Goals - 10/17/20 1436       PT LONG TERM GOAL #1   Title Pt. will improve FOTO score to >62 to improve independence with ADLs    Baseline 42 9/6: 57    Time 6    Period Weeks    Status On-going    Target Date  11/27/20      PT LONG TERM GOAL #2   Title Pt will improve R ankle MMT to qual to L to faciliate increased stability for return to walks on the beach.    Baseline R/L: 3/5 Ankle dorsiflexion    5/25 bilat Sl calf raise Ankle plantarflexion  *pain with all MMT testing of the ankle/foot*  3/5 ankle inversion   3/5 ankle eversion 9/6: R/L: 4+/5 Ankle dorsiflexion    8/25 bilat Sl calf raise Ankle plantarflexion  *pain with all MMT testing of the ankle/foot*  4/5 ankle inversion   4/5 ankle eversion    Time 6    Period Weeks    Status Partially Met    Target Date 11/27/20      PT LONG TERM GOAL #3   Title Pt will be able to complete 20 steps with R ankle pain >3/10 to facilitate greater mobility within the home.    Baseline 7-8/10 NPS 9/6: 2/10 at the lowest. 8 at the worse. Stair climbing is very difficult    Time 6    Period Weeks    Status Partially Met    Target Date 11/27/20      PT LONG TERM GOAL #4   Title Pt will be able to complete work/home mobility tasks with laced ankle braced doff >50% for return to work specific foot wear.    Baseline Pt current needs to wear high cushion tennis shoes to work, brace donned 100% 9/6: laced brace doffed during ADLs the last 4 days. PT will wear brace during long work days.    Time 6    Period Weeks    Status Partially Met    Target Date 11/27/20                   Plan - 10/25/20 0837     Clinical Impression Statement Pt. presents to tx with acute episode of cervical neck pain with LUE distal weakness and n/t. Pt states slight reduction in cervical neck pain without reduction in superior rhomboid pain. Pt was educated on pulley's and chin tucks for in office and home sx management. Pt stated " good stretch" during end range L shoulder flexion. Pt continues to display mark LE/UE endurance, strength, and ROM deficits resulting in decreased safe home/community mobiity, increased pain, and limited access to QOL. Pt. will continue to benefit  from skilled physical therapy to progress POC to address remaining deficits to facilitate maximum  functional capacity for optimal personal health and wellness for ADLs.  Instructed pt. to wean out of soft cervical collar this weekend.    Personal Factors and Comorbidities Comorbidity 2;Profession;Past/Current Experience    Comorbidities Hypertension, Obesity    Examination-Activity Limitations Squat;Locomotion Level    Examination-Participation Restrictions Occupation;Cleaning;Laundry    Stability/Clinical Decision Making Evolving/Moderate complexity    Clinical Decision Making Moderate    Rehab Potential Good    PT Frequency 2x / week    PT Duration 6 weeks    PT Treatment/Interventions ADLs/Self Care Home Management;Aquatic Therapy;Biofeedback;Cryotherapy;Electrical Stimulation;Moist Heat;Ultrasound;Functional mobility training;Therapeutic activities;Therapeutic exercise;Balance training;Neuromuscular re-education;Patient/family education;Manual techniques;Scar mobilization;Passive range of motion;Dry needling;Energy conservation;Taping;Gait training;Stair training;DME Instruction;Spinal Manipulations;Joint Manipulations    PT Next Visit Plan Progress R ankle stability/ functional mobility.  Discuss neck/ UE radicular symptoms.    PT Home Exercise Plan [id number]    Consulted and Agree with Plan of Care Patient             Patient will benefit from skilled therapeutic intervention in order to improve the following deficits and impairments:  Decreased mobility, Decreased endurance, Hypomobility, Decreased range of motion, Decreased scar mobility, Improper body mechanics, Postural dysfunction, Impaired flexibility, Decreased strength, Decreased activity tolerance, Abnormal gait, Decreased balance, Difficulty walking, Increased edema, Impaired perceived functional ability  Visit Diagnosis: Pain in right ankle and joints of right foot  Joint stiffness of spine     Problem List Patient  Active Problem List   Diagnosis Date Noted   H/O of hemilaminectomy 02/15/2019   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[place]) 11/17/2018   Fatigue 09/23/2017   History of pneumonia 04/30/2016   Grief reaction 04/30/2016   Vitamin D deficiency 02/22/2015   Hyperlipidemia 01/01/2015   Visit for preventive health examination 12/04/2013   Lower extremity edema 09/02/2013   Essential hypertension 09/02/2013   History of cardiomyopathy 09/02/2013   SVT (supraventricular tachycardia) ([place]) 09/02/2013   Palpitations 09/02/2013   Contrast media allergy 03/10/2013   Fibrocystic breast disease    Morbid obesity ([place]) 02/05/2012   Meniere disease    Pura Spice, PT, DPT # [id number] Fara Olden, SPT 10/25/2020, 11:15 AM  [facility] [address] [place], [place], [address] Phone: [phone]   Fax:  [phone]  Name: Amanda Humphrey MRN: [id number] Date of Birth: [birth year]

## 2020-10-30 ENCOUNTER — Ambulatory Visit: Payer: BC Managed Care – PPO | Attending: Podiatry | Admitting: Physical Therapy

## 2020-10-30 ENCOUNTER — Encounter: Payer: Self-pay | Admitting: Physical Therapy

## 2020-10-30 ENCOUNTER — Other Ambulatory Visit: Payer: Self-pay

## 2020-10-30 DIAGNOSIS — M6281 Muscle weakness (generalized): Secondary | ICD-10-CM | POA: Diagnosis not present

## 2020-10-30 DIAGNOSIS — R29898 Other symptoms and signs involving the musculoskeletal system: Secondary | ICD-10-CM

## 2020-10-30 DIAGNOSIS — M47812 Spondylosis without myelopathy or radiculopathy, cervical region: Secondary | ICD-10-CM | POA: Diagnosis not present

## 2020-10-30 DIAGNOSIS — Z9889 Other specified postprocedural states: Secondary | ICD-10-CM | POA: Diagnosis not present

## 2020-10-30 DIAGNOSIS — M79601 Pain in right arm: Secondary | ICD-10-CM

## 2020-10-30 DIAGNOSIS — M5412 Radiculopathy, cervical region: Secondary | ICD-10-CM | POA: Diagnosis not present

## 2020-10-30 DIAGNOSIS — M4802 Spinal stenosis, cervical region: Secondary | ICD-10-CM | POA: Diagnosis not present

## 2020-10-30 DIAGNOSIS — M7918 Myalgia, other site: Secondary | ICD-10-CM | POA: Diagnosis not present

## 2020-10-30 DIAGNOSIS — M2560 Stiffness of unspecified joint, not elsewhere classified: Secondary | ICD-10-CM | POA: Diagnosis not present

## 2020-10-30 DIAGNOSIS — S96919A Strain of unspecified muscle and tendon at ankle and foot level, unspecified foot, initial encounter: Secondary | ICD-10-CM | POA: Insufficient documentation

## 2020-10-30 DIAGNOSIS — S93409A Sprain of unspecified ligament of unspecified ankle, initial encounter: Secondary | ICD-10-CM | POA: Diagnosis not present

## 2020-10-30 DIAGNOSIS — M25571 Pain in right ankle and joints of right foot: Secondary | ICD-10-CM | POA: Diagnosis not present

## 2020-10-30 NOTE — Therapy (Signed)
[facility] [place] [facility] [address] [place], [place], [address] Phone: [phone]   Fax:  [phone]  Physical Therapy Treatment  Patient Details  Name: Amanda Humphrey MRN: [id number] Date of Birth: 12-14-1965 Referring Provider (PT): Samara Deist, DPM   Encounter Date: 10/30/2020   PT End of Session - 10/30/20 1000     Visit Number 15    Number of Visits 24    Date for PT Re-Evaluation 11/27/20    Authorization - Visit Number 5    Authorization - Number of Visits 10    PT Start Time 0998    PT Stop Time 0816    PT Time Calculation (min) 30 min    Activity Tolerance Patient tolerated treatment well;Patient limited by pain    Behavior During Therapy [facility] for tasks assessed/performed             Past Medical History:  Diagnosis Date   Allergy    Chronic kidney disease    stones   Fibrocystic breast disease 2013   Meniere disease    Migraines     Past Surgical History:  Procedure Laterality Date   ABDOMINAL HYSTERECTOMY  2011   laprascopic supracervical, [place]   APPENDECTOMY  1999   BREAST BIOPSY Right Nov 2012    fibrocystic disease, [place]   CESAREAN SECTION     4 total   CHOLECYSTECTOMY  2011   [place]    There were no vitals filed for this visit.   Subjective Assessment - 10/30/20 0957     Subjective Pt presents to tx with continued reports of severe neck pain with L shoulder involvement. Pt is hoping to get a visit with the neurologist ASAP with current waitlist. Pt does have a scheduled visit 11/12/20. Pt states that her ankle is feeling better with steroid taper and good adherence to HEP.    Pertinent History MRI results: Rupture of R ant talofibular ligament. History of severe neck pain that causes radiation to the R UE. Cervical Srugery C6-T1(early 2021). History of bilat plantar fasciitis and heel spurs. Pt's spouse has health complication resulting with increased home ADL  reponsibilies. Pt enjoys visiting and walking on the beach, which she is currently unable to do at this time 2/2 ankle instability and pain.    Limitations Lifting;House hold activities    How long can you sit comfortably? WNL    How long can you stand comfortably? WNL with brace, fear of movement without brace.    How long can you walk comfortably? WNL with brace, fear of movement without brace.    Diagnostic tests MRI    Patient Stated Goals Decrease muscle spasms, return to work    Currently in Pain? Yes    Pain Score 7     Pain Location Neck    Pain Orientation Left    Pain Descriptors / Indicators Aching;Throbbing    Pain Onset More than a month ago    Pain Onset More than a month ago              Treatment  Therapeutic Exercise:  *All treatment completed in supine with pillow roll to support the cervical spine. This position was used to protect cervical spine and reduce pain*  1) wood dowel bilat shoulder flexion x30 and  1 # dumbbell ABCs at 90 deg of flexion.   2) R ankle rhythmic stabilization 30 sec bouts x 3. Pt displayed quickly fatigue ankle without  increase in pain   3) RTB Isometric 5 sec holds with PT contact cueing to ensure proper ankle alignment to target appropriate stabilization muscles.       Manual Therapy:   1) Rapid movement, low force ankle mobility D1/D2 patterns to facilitate proprioceptive adaptations during steroid intervention for great mobility with reduced pain   2) STM to L upper trap, rhomboid, post rotator cuff, and medial subscapularis. Verbal cueing to selection intensity of pressure within pt tolerance.      PT Long Term Goals - 10/17/20 1436       PT LONG TERM GOAL #1   Title Pt. will improve FOTO score to >62 to improve independence with ADLs    Baseline 42 9/6: 57    Time 6    Period Weeks    Status On-going    Target Date 11/27/20      PT LONG TERM GOAL #2   Title Pt will improve R ankle MMT to qual to L to faciliate  increased stability for return to walks on the beach.    Baseline R/L: 3/5 Ankle dorsiflexion    5/25 bilat Sl calf raise Ankle plantarflexion  *pain with all MMT testing of the ankle/foot*  3/5 ankle inversion   3/5 ankle eversion 9/6: R/L: 4+/5 Ankle dorsiflexion    8/25 bilat Sl calf raise Ankle plantarflexion  *pain with all MMT testing of the ankle/foot*  4/5 ankle inversion   4/5 ankle eversion    Time 6    Period Weeks    Status Partially Met    Target Date 11/27/20      PT LONG TERM GOAL #3   Title Pt will be able to complete 20 steps with R ankle pain >3/10 to facilitate greater mobility within the home.    Baseline 7-8/10 NPS 9/6: 2/10 at the lowest. 8 at the worse. Stair climbing is very difficult    Time 6    Period Weeks    Status Partially Met    Target Date 11/27/20      PT LONG TERM GOAL #4   Title Pt will be able to complete work/home mobility tasks with laced ankle braced doff >50% for return to work specific foot wear.    Baseline Pt current needs to wear high cushion tennis shoes to work, brace donned 100% 9/6: laced brace doffed during ADLs the last 4 days. PT will wear brace during long work days.    Time 6    Period Weeks    Status Partially Met    Target Date 11/27/20                   Plan - 10/30/20 1001     Clinical Impression Statement Today's tx was completed in supine to support the cervical spine and promote reduction in pain. Pt displayed increased activity tolerance with the ankle 2/2 steroid taper. Pt continues to be limited in regards to activity 2/2 to severe neck pain. Pt continues to display mark LE/UE/cervical endurance, strength, and ROM deficits resulting in decreased safe home/community mobiity, increased pain, and limited access to QOL. Pt. will continue to benefit from skilled physical therapy to progress POC to address remaining deficits to facilitate maximum functional capacity for optimal personal health and wellness for ADLs.     Personal Factors and Comorbidities Comorbidity 2;Profession;Past/Current Experience    Comorbidities Hypertension, Obesity    Examination-Activity Limitations Squat;Locomotion Level    Examination-Participation Restrictions Occupation;Cleaning;Laundry    Stability/Clinical Decision  Making Evolving/Moderate complexity    Clinical Decision Making Moderate    Rehab Potential Good    PT Frequency 2x / week    PT Duration 6 weeks    PT Treatment/Interventions ADLs/Self Care Home Management;Aquatic Therapy;Biofeedback;Cryotherapy;Electrical Stimulation;Moist Heat;Ultrasound;Functional mobility training;Therapeutic activities;Therapeutic exercise;Balance training;Neuromuscular re-education;Patient/family education;Manual techniques;Scar mobilization;Passive range of motion;Dry needling;Energy conservation;Taping;Gait training;Stair training;DME Instruction;Spinal Manipulations;Joint Manipulations    PT Next Visit Plan Progress R ankle stability/ functional mobility.  Discuss neck/ UE radicular symptoms.    PT Home Exercise Plan [id number]    Consulted and Agree with Plan of Care Patient             Patient will benefit from skilled therapeutic intervention in order to improve the following deficits and impairments:  Decreased mobility, Decreased endurance, Hypomobility, Decreased range of motion, Decreased scar mobility, Improper body mechanics, Postural dysfunction, Impaired flexibility, Decreased strength, Decreased activity tolerance, Abnormal gait, Decreased balance, Difficulty walking, Increased edema, Impaired perceived functional ability  Visit Diagnosis: Pain in right ankle and joints of right foot  Ankle weakness  Sprain and strain of ankle  Pain in right arm  Joint stiffness of spine  Muscle weakness (generalized)     Problem List Patient Active Problem List   Diagnosis Date Noted   H/O of hemilaminectomy 02/15/2019   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[place]) 11/17/2018    Fatigue 09/23/2017   History of pneumonia 04/30/2016   Grief reaction 04/30/2016   Vitamin D deficiency 02/22/2015   Hyperlipidemia 01/01/2015   Visit for preventive health examination 12/04/2013   Lower extremity edema 09/02/2013   Essential hypertension 09/02/2013   History of cardiomyopathy 09/02/2013   SVT (supraventricular tachycardia) ([place]) 09/02/2013   Palpitations 09/02/2013   Contrast media allergy 03/10/2013   Fibrocystic breast disease    Morbid obesity ([place]) 02/05/2012   Meniere disease    Pura Spice, PT, DPT # [id number] Fara Olden, SPT 10/30/2020, 11:01 AM  [facility] [address] [place], [place], [address] Phone: [phone]   Fax:  [phone]  Name: Amanda Humphrey MRN: [id number] Date of Birth: [birth year]

## 2020-11-01 ENCOUNTER — Encounter: Payer: Self-pay | Admitting: Physical Therapy

## 2020-11-01 ENCOUNTER — Other Ambulatory Visit: Payer: Self-pay

## 2020-11-01 ENCOUNTER — Ambulatory Visit: Payer: BC Managed Care – PPO | Admitting: Physical Therapy

## 2020-11-01 DIAGNOSIS — R29898 Other symptoms and signs involving the musculoskeletal system: Secondary | ICD-10-CM | POA: Diagnosis not present

## 2020-11-01 DIAGNOSIS — M6281 Muscle weakness (generalized): Secondary | ICD-10-CM

## 2020-11-01 DIAGNOSIS — M79601 Pain in right arm: Secondary | ICD-10-CM

## 2020-11-01 DIAGNOSIS — S96919A Strain of unspecified muscle and tendon at ankle and foot level, unspecified foot, initial encounter: Secondary | ICD-10-CM

## 2020-11-01 DIAGNOSIS — S93409A Sprain of unspecified ligament of unspecified ankle, initial encounter: Secondary | ICD-10-CM | POA: Diagnosis not present

## 2020-11-01 DIAGNOSIS — M2560 Stiffness of unspecified joint, not elsewhere classified: Secondary | ICD-10-CM | POA: Diagnosis not present

## 2020-11-01 DIAGNOSIS — M25571 Pain in right ankle and joints of right foot: Secondary | ICD-10-CM

## 2020-11-01 NOTE — Therapy (Addendum)
[facility] [address] [place], [place], [address] Phone: [phone]   Fax:  [phone]  Physical Therapy Treatment  Patient Details  Name: Amanda Humphrey MRN: [id number] Date of Birth: 13-Dec-1965 Referring Provider (PT): Orbie Pyo, [place]   Encounter Date: 11/01/2020    PT End of Session - 11/01/20 0904     Visit Number 16    Number of Visits 24    Date for PT Re-Evaluation 11/27/20    Authorization - Visit Number 6    Authorization - Number of Visits 10    PT Start Time 2836    PT Stop Time 6294    PT Time Calculation (min) 38 min    Activity Tolerance Patient tolerated treatment well;Patient limited by pain    Behavior During Therapy [facility] for tasks assessed/performed             Past Medical History:  Diagnosis Date   Allergy    Chronic kidney disease    stones   Fibrocystic breast disease 2013   Meniere disease    Migraines     Past Surgical History:  Procedure Laterality Date   ABDOMINAL HYSTERECTOMY  2011   laprascopic supracervical, [place]   APPENDECTOMY  1999   BREAST BIOPSY Right Nov 2012    fibrocystic disease, [place]   CESAREAN SECTION     4 total   CHOLECYSTECTOMY  2011   [place]    There were no vitals filed for this visit.   Subjective Assessment - 11/01/20 0903     Subjective Pt presents to tx today 11/01/2020 with PT order for myofascial pain syndrome, cervical spine. PT stated onset ~ 2 weeks ago without MOI. Pt stated occ wearing of soft cervical collar for posture awareness and sx reduction. Pt states secondary pain to the L scapular that she feels like is under the scapula. Pt states that we scapular pain is flared up, it is difficulty to breath. Pt stated that she had trigger point injection on 10/30/2020 that resulted with increased pain. 8/10 current, 7/10 best, 10/10 Worse.    Pertinent History MRI results: Rupture of R ant talofibular ligament.  History of severe neck pain that causes radiation to the R UE. Cervical Srugery C6-T1(early 2021). History of bilat plantar fasciitis and heel spurs. Pt's spouse has health complication resulting with increased home ADL reponsibilies. Pt enjoys visiting and walking on the beach, which she is currently unable to do at this time 2/2 ankle instability and pain.    Limitations Lifting;House hold activities    How long can you sit comfortably? WNL    How long can you stand comfortably? WNL with brace, fear of movement without brace.    How long can you walk comfortably? WNL with brace, fear of movement without brace.    Diagnostic tests MRI    Patient Stated Goals Decrease muscle spasms, return to work    Currently in Pain? Yes    Pain Score 8     Pain Location Neck    Pain Orientation Mid;Left;Upper    Pain Descriptors / Indicators Aching;Sharp;Throbbing    Pain Onset More than a month ago    Pain Onset More than a month ago               Pt cervical spine was assessed on this date 11/01/2020  UE MMT R/L:  Shoulder Flexion: 4/4- Shoulder abduction: 4/4- Shoulder ER: 4/4 Shoulder IR: 4/4 Elbow  ext: 5/5  Eblow flex: 5/5  Grip R/L: 76.1 / 33.9  Lateral prehension grip R/L: 14 / 4  Cervical ROM: Flex: 58  Ext: 42 L rotation: 71 R rotation: 80  L lateral flex: 38 R lateral flex: 42  FOTO 52/62  Coordination/proprioception:  Pt displayed increased effort and decrease speed during thumb to finger movement. Pt displayed mild difficulty with R thumb find test.      Manual Therapy 18 mins:  Position in supine in verbal cueing to ensure proper cervical alignment.   1)STM L upper trap, rhomboid, subscapularis, levator scapulae, and post rotator cuff. Verbal cueing with pt to determine appropriate pressure within pt tolerance.   2) CPA  T1-T6 grade 2 30 bouts x 2.   Ice pack in prone over upper thoracic, lower cervical, and L shoulder 10 mins *NOT BILLED*.       PT Long  Term Goals - 11/01/20 1120       PT LONG TERM GOAL #1   Title Pt. will improve FOTO score to >62 to improve independence with ADLs *foot*    Baseline 42 9/6: 57    Time 6    Period Weeks    Status On-going    Target Date 11/27/20      PT LONG TERM GOAL #2   Title Pt will improve R ankle MMT to qual to L to faciliate increased stability for return to walks on the beach.    Baseline R/L: 3/5 Ankle dorsiflexion    5/25 bilat Sl calf raise Ankle plantarflexion  *pain with all MMT testing of the ankle/foot*  3/5 ankle inversion   3/5 ankle eversion 9/6: R/L: 4+/5 Ankle dorsiflexion    8/25 bilat Sl calf raise Ankle plantarflexion  *pain with all MMT testing of the ankle/foot*  4/5 ankle inversion   4/5 ankle eversion    Time 6    Period Weeks    Status Partially Met    Target Date 11/27/20      PT LONG TERM GOAL #3   Title Pt will be able to complete 20 steps with R ankle pain >3/10 to facilitate greater mobility within the home.    Baseline 7-8/10 NPS 9/6: 2/10 at the lowest. 8 at the worse. Stair climbing is very difficult    Time 6    Period Weeks    Status Partially Met    Target Date 11/27/20      PT LONG TERM GOAL #4   Title Pt will be able to complete work/home mobility tasks with laced ankle braced doff >50% for return to work specific foot wear.    Baseline Pt current needs to wear high cushion tennis shoes to work, brace donned 100% 9/6: laced brace doffed during ADLs the last 4 days. PT will wear brace during long work days.    Time 6    Period Weeks    Status Partially Met    Target Date 11/27/20      PT LONG TERM GOAL #5   Title Pt will improve FOTO score to predicted improvement value of 62 in regards to cervical impairments.    Baseline 52    Time 4    Period Weeks    Status New    Target Date 11/29/20      Additional Long Term Goals   Additional Long Term Goals Yes      PT LONG TERM GOAL #6   Title Pt will improve worse cervical pain by atleast  3 NPS points  to allow increased tolerance during work and home related ADLs.    Baseline NPS: 8/10 current, 7/10 best, 10/10 Worse.    Time 4    Period Weeks    Status New    Target Date 11/29/20      PT LONG TERM GOAL #7   Title Pt will improve LUE MMT equal to RUE to greater functional abilities with ADLs related to caregiving    Baseline UE MMT R/L:   Shoulder Flexion: 4/4-  Shoulder abduction: 4/4-  Shoulder ER: 4/4  Shoulder IR: 4/4  Elbow ext: 5/5   Eblow flex: 5/5    Time 4    Period Weeks    Status New    Target Date 11/29/20      PT LONG TERM GOAL #8   Title Pt will improve functional R hand strength equal to L to promote return of hand useage during grooming and feeding.    Baseline Grip R/L: 76.1 / 33.9    Lateral prehension grip R/L: 14 / 4    Time 4    Period Weeks    Status New    Target Date 11/29/20                   Plan - 11/01/20 0905     Clinical Impression Statement Pt cervical spine was assessed on this date 11/01/2020: UE MMT R/L: Shoulder Flexion: 4/4-. Shoulder abduction: 4/4-. Shoulder ER: 4/4. Shoulder IR: 4/4. Elbow ext: 5/5. Elbow flex: 5/5. Grip R/L: 76.1 / 33.9. Lateral prehension grip R/L: 14 / 4. Cervical ROM:  Flex: 58. Ext: 42. L rotation: 71. R rotation: 80. L lateral flex: 38. R lateral flex: 42. FOTO 52/62. Coordination/proprioception:  Pt displayed increased effort and decrease speed during thumb to finger movement. Pt displayed mild difficulty with R thumb find test. NPS: 8/10 current, 7/10 best, 10/10 Worse. Current PT POC will include further tx of the L ankle with primary focus to address cervical neck pain. Pt continues to display mark LE/UE/cervical endurance, strength, and ROM deficits resulting in decreased safe home/community mobiity, increased pain, and limited access to QOL. Pt. will continue to benefit from skilled physical therapy to progress POC to address remaining deficits to facilitate maximum functional capacity for optimal personal  health and wellness for ADLs.    Personal Factors and Comorbidities Comorbidity 2;Profession;Past/Current Experience    Comorbidities Hypertension, Obesity    Examination-Activity Limitations Squat;Locomotion Level    Examination-Participation Restrictions Occupation;Cleaning;Laundry    Stability/Clinical Decision Making Evolving/Moderate complexity    Clinical Decision Making Moderate    Rehab Potential Good    PT Frequency 2x / week    PT Duration 6 weeks    PT Treatment/Interventions ADLs/Self Care Home Management;Aquatic Therapy;Biofeedback;Cryotherapy;Electrical Stimulation;Moist Heat;Ultrasound;Functional mobility training;Therapeutic activities;Therapeutic exercise;Balance training;Neuromuscular re-education;Patient/family education;Manual techniques;Scar mobilization;Passive range of motion;Dry needling;Energy conservation;Taping;Gait training;Stair training;DME Instruction;Spinal Manipulations;Joint Manipulations    PT Next Visit Plan Possible d/n to upper trap/cervical spine.    PT Home Exercise Plan [id number]    Consulted and Agree with Plan of Care Patient             Patient will benefit from skilled therapeutic intervention in order to improve the following deficits and impairments:  Decreased mobility, Decreased endurance, Hypomobility, Decreased range of motion, Decreased scar mobility, Improper body mechanics, Postural dysfunction, Impaired flexibility, Decreased strength, Decreased activity tolerance, Abnormal gait, Decreased balance, Difficulty walking, Increased edema, Impaired perceived functional ability  Visit Diagnosis: Pain in right ankle  and joints of right foot  Joint stiffness of spine  Muscle weakness (generalized)  Ankle weakness  Sprain and strain of ankle  Pain in right arm     Problem List Patient Active Problem List   Diagnosis Date Noted   H/O of hemilaminectomy 02/15/2019   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[place]) 11/17/2018   Fatigue  09/23/2017   History of pneumonia 04/30/2016   Grief reaction 04/30/2016   Vitamin D deficiency 02/22/2015   Hyperlipidemia 01/01/2015   Visit for preventive health examination 12/04/2013   Lower extremity edema 09/02/2013   Essential hypertension 09/02/2013   History of cardiomyopathy 09/02/2013   SVT (supraventricular tachycardia) ([place]) 09/02/2013   Palpitations 09/02/2013   Contrast media allergy 03/10/2013   Fibrocystic breast disease    Morbid obesity ([place]) 02/05/2012   Meniere disease    Pura Spice, PT, DPT # [id number] 11/03/2020, 6:26 PM  [facility] [address] [place], [place], [address] Phone: [phone]   Fax:  [phone]  Name: Amanda Humphrey MRN: [id number] Date of Birth: September 28, 1965

## 2020-11-06 ENCOUNTER — Ambulatory Visit: Payer: BC Managed Care – PPO | Admitting: Physical Therapy

## 2020-11-06 ENCOUNTER — Encounter: Payer: Self-pay | Admitting: Physical Therapy

## 2020-11-06 ENCOUNTER — Other Ambulatory Visit: Payer: Self-pay

## 2020-11-06 DIAGNOSIS — M25571 Pain in right ankle and joints of right foot: Secondary | ICD-10-CM

## 2020-11-06 DIAGNOSIS — R29898 Other symptoms and signs involving the musculoskeletal system: Secondary | ICD-10-CM

## 2020-11-06 DIAGNOSIS — S93409A Sprain of unspecified ligament of unspecified ankle, initial encounter: Secondary | ICD-10-CM

## 2020-11-06 DIAGNOSIS — M6281 Muscle weakness (generalized): Secondary | ICD-10-CM

## 2020-11-06 DIAGNOSIS — M79601 Pain in right arm: Secondary | ICD-10-CM | POA: Diagnosis not present

## 2020-11-06 DIAGNOSIS — M2560 Stiffness of unspecified joint, not elsewhere classified: Secondary | ICD-10-CM

## 2020-11-06 DIAGNOSIS — S96919A Strain of unspecified muscle and tendon at ankle and foot level, unspecified foot, initial encounter: Secondary | ICD-10-CM | POA: Diagnosis not present

## 2020-11-06 NOTE — Therapy (Signed)
[facility] [address] [place], [place], [address] Phone: [phone]   Fax:  [phone]  Physical Therapy Treatment  Patient Details  Name: Amanda Humphrey MRN: [id number] Date of Birth: January 01, 1966 Referring Provider (PT): Orbie Pyo, [place]   Encounter Date: 11/06/2020   PT End of Session - 11/06/20 0824     Visit Number 17    Number of Visits 24    Date for PT Re-Evaluation 11/27/20    Authorization - Visit Number 7    Authorization - Number of Visits 10    PT Start Time 6203    PT Stop Time 0826    PT Time Calculation (min) 45 min    Activity Tolerance Patient tolerated treatment well;Patient limited by pain    Behavior During Therapy [facility] for tasks assessed/performed             Past Medical History:  Diagnosis Date   Allergy    Chronic kidney disease    stones   Fibrocystic breast disease 2013   Meniere disease    Migraines     Past Surgical History:  Procedure Laterality Date   ABDOMINAL HYSTERECTOMY  2011   laprascopic supracervical, [place]   APPENDECTOMY  1999   BREAST BIOPSY Right Nov 2012    fibrocystic disease, [place]   CESAREAN SECTION     4 total   CHOLECYSTECTOMY  2011   [place]    There were no vitals filed for this visit.   Subjective Assessment - 11/06/20 0751     Subjective Pt present to tx with continued reports of high amounts of pain, NPS 8/10. Pt states poor adherence to sleep and activity over the weekend. Pt states pain that travels down the R arm during neck flare ups.    Pertinent History MRI results: Rupture of R ant talofibular ligament. History of severe neck pain that causes radiation to the R UE. Cervical Srugery C6-T1(early 2021). History of bilat plantar fasciitis and heel spurs. Pt's spouse has health complication resulting with increased home ADL reponsibilies. Pt enjoys visiting and walking on the beach, which she is currently unable to  do at this time 2/2 ankle instability and pain.    Limitations Lifting;House hold activities    How long can you sit comfortably? WNL    How long can you stand comfortably? WNL with brace, fear of movement without brace.    How long can you walk comfortably? WNL with brace, fear of movement without brace.    Diagnostic tests MRI    Patient Stated Goals Decrease muscle spasms, return to work    Currently in Pain? Yes    Pain Score 8     Pain Location Neck    Pain Orientation Mid;Left;Upper    Pain Descriptors / Indicators Aching;Sharp;Throbbing    Pain Type Acute pain    Pain Onset More than a month ago    Pain Onset More than a month ago               Treatment Therapeutic Exercise:   *Tx stated in supine with MHP over the cervical and upper thoracic spine and bilat shoulders. 8 mins *not billed**.   Prone STM to L upper trap, levator scapulae, rhomboid, and posterior rotator cuff.   Trigger Point Dry Needling (TDN) Education performed with patient regarding potential benefit of TDN. Reviewed precautions and risks with patient. Reviewed special precautions/risks over lung fields which include pneumothorax. Reviewed  signs and symptoms of pneumothorax and advised pt to go to ER immediately if these symptoms develop advise them of dry needling treatment. Pt provided verbal consent to treatment. TDN performed to L UT/ levator musculature with 2 needling, 0.25 x 60 L-type single needle placements on each side with 1 local twitch response (LTR). Pistoning technique utilized. Improved pain-free motion following intervention.     PT Long Term Goals - 11/01/20 1120       PT LONG TERM GOAL #1   Title Pt. will improve FOTO score to >62 to improve independence with ADLs *foot*    Baseline 42 9/6: 57    Time 6    Period Weeks    Status On-going    Target Date 11/27/20      PT LONG TERM GOAL #2   Title Pt will improve R ankle MMT to qual to L to faciliate increased stability for  return to walks on the beach.    Baseline R/L: 3/5 Ankle dorsiflexion    5/25 bilat Sl calf raise Ankle plantarflexion  *pain with all MMT testing of the ankle/foot*  3/5 ankle inversion   3/5 ankle eversion 9/6: R/L: 4+/5 Ankle dorsiflexion    8/25 bilat Sl calf raise Ankle plantarflexion  *pain with all MMT testing of the ankle/foot*  4/5 ankle inversion   4/5 ankle eversion    Time 6    Period Weeks    Status Partially Met    Target Date 11/27/20      PT LONG TERM GOAL #3   Title Pt will be able to complete 20 steps with R ankle pain >3/10 to facilitate greater mobility within the home.    Baseline 7-8/10 NPS 9/6: 2/10 at the lowest. 8 at the worse. Stair climbing is very difficult    Time 6    Period Weeks    Status Partially Met    Target Date 11/27/20      PT LONG TERM GOAL #4   Title Pt will be able to complete work/home mobility tasks with laced ankle braced doff >50% for return to work specific foot wear.    Baseline Pt current needs to wear high cushion tennis shoes to work, brace donned 100% 9/6: laced brace doffed during ADLs the last 4 days. PT will wear brace during long work days.    Time 6    Period Weeks    Status Partially Met    Target Date 11/27/20      PT LONG TERM GOAL #5   Title Pt will improve FOTO score to predicted improvement value of 62 in regards to cervical impairments.    Baseline 52    Time 4    Period Weeks    Status New    Target Date 11/29/20      Additional Long Term Goals   Additional Long Term Goals Yes      PT LONG TERM GOAL #6   Title Pt will improve worse cervical pain by atleast 3 NPS points to allow increased tolerance during work and home related ADLs.    Baseline NPS: 8/10 current, 7/10 best, 10/10 Worse.    Time 4    Period Weeks    Status New    Target Date 11/29/20      PT LONG TERM GOAL #7   Title Pt will improve LUE MMT equal to RUE to greater functional abilities with ADLs related to caregiving    Baseline UE MMT R/L:  Shoulder Flexion: 4/4-  Shoulder abduction: 4/4-  Shoulder ER: 4/4  Shoulder IR: 4/4  Elbow ext: 5/5   Eblow flex: 5/5    Time 4    Period Weeks    Status New    Target Date 11/29/20      PT LONG TERM GOAL #8   Title Pt will improve functional R hand strength equal to L to promote return of hand useage during grooming and feeding.    Baseline Grip R/L: 76.1 / 33.9    Lateral prehension grip R/L: 14 / 4    Time 4    Period Weeks    Status New    Target Date 11/29/20                   Plan - 11/06/20 1055     Clinical Impression Statement Pt presentation to tx resulted in limited activity tolerance. Today's tx focus was on sx reduction to allow the pt greater access to work ADLs and sleep tolerance. Pt stated moderate reduction in sx post manual intervention, however, continues to states activity limiting pain. Pt discussion was facilitate to r/o possible cardiovascular involvement. Pt states that she recently had a screen and regular appt with her cardiologist with cleared her of possible pathology. Pt continues to display mark LE/UE endurance, strength, and ROM deficits resulting in decreased safe home/community mobiity, increased pain, and limited access to QOL. Pt. will continue to benefit from skilled physical therapy to progress POC to address remaining deficits to facilitate maximum functional capacity for optimal personal health and wellness for ADLs.    Personal Factors and Comorbidities Comorbidity 2;Profession;Past/Current Experience    Comorbidities Hypertension, Obesity    Examination-Activity Limitations Squat;Locomotion Level    Examination-Participation Restrictions Occupation;Cleaning;Laundry    Stability/Clinical Decision Making Evolving/Moderate complexity    Clinical Decision Making Moderate    Rehab Potential Good    PT Frequency 2x / week    PT Duration 6 weeks    PT Treatment/Interventions ADLs/Self Care Home Management;Aquatic  Therapy;Biofeedback;Cryotherapy;Electrical Stimulation;Moist Heat;Ultrasound;Functional mobility training;Therapeutic activities;Therapeutic exercise;Balance training;Neuromuscular re-education;Patient/family education;Manual techniques;Scar mobilization;Passive range of motion;Dry needling;Energy conservation;Taping;Gait training;Stair training;DME Instruction;Spinal Manipulations;Joint Manipulations    PT Next Visit Plan Discuss d/n to upper trap/cervical spine.    PT Home Exercise Plan [id number]    Consulted and Agree with Plan of Care Patient             Patient will benefit from skilled therapeutic intervention in order to improve the following deficits and impairments:  Decreased mobility, Decreased endurance, Hypomobility, Decreased range of motion, Decreased scar mobility, Improper body mechanics, Postural dysfunction, Impaired flexibility, Decreased strength, Decreased activity tolerance, Abnormal gait, Decreased balance, Difficulty walking, Increased edema, Impaired perceived functional ability  Visit Diagnosis: Pain in right ankle and joints of right foot  Sprain and strain of ankle  Pain in right arm  Muscle weakness (generalized)  Joint stiffness of spine  Ankle weakness     Problem List Patient Active Problem List   Diagnosis Date Noted   H/O of hemilaminectomy 02/15/2019   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[place]) 11/17/2018   Fatigue 09/23/2017   History of pneumonia 04/30/2016   Grief reaction 04/30/2016   Vitamin D deficiency 02/22/2015   Hyperlipidemia 01/01/2015   Visit for preventive health examination 12/04/2013   Lower extremity edema 09/02/2013   Essential hypertension 09/02/2013   History of cardiomyopathy 09/02/2013   SVT (supraventricular tachycardia) ([place]) 09/02/2013   Palpitations 09/02/2013   Contrast media allergy 03/10/2013  Fibrocystic breast disease    Morbid obesity ([place]) 02/05/2012   Meniere disease    Pura Spice, PT, DPT #  [id number] Fara Olden, SPT 11/07/2020, 10:28 AM  [facility] [address] [place], [place], [address] Phone: [phone]   Fax:  [phone]  Name: Amanda Humphrey MRN: [id number] Date of Birth: November 22, 1965

## 2020-11-08 ENCOUNTER — Other Ambulatory Visit: Payer: Self-pay

## 2020-11-08 ENCOUNTER — Ambulatory Visit: Payer: BC Managed Care – PPO | Admitting: Physical Therapy

## 2020-11-08 ENCOUNTER — Encounter: Payer: Self-pay | Admitting: Physical Therapy

## 2020-11-08 DIAGNOSIS — R29898 Other symptoms and signs involving the musculoskeletal system: Secondary | ICD-10-CM | POA: Diagnosis not present

## 2020-11-08 DIAGNOSIS — S93409A Sprain of unspecified ligament of unspecified ankle, initial encounter: Secondary | ICD-10-CM | POA: Diagnosis not present

## 2020-11-08 DIAGNOSIS — M25571 Pain in right ankle and joints of right foot: Secondary | ICD-10-CM | POA: Diagnosis not present

## 2020-11-08 DIAGNOSIS — M2560 Stiffness of unspecified joint, not elsewhere classified: Secondary | ICD-10-CM

## 2020-11-08 DIAGNOSIS — M6281 Muscle weakness (generalized): Secondary | ICD-10-CM | POA: Diagnosis not present

## 2020-11-08 DIAGNOSIS — M79601 Pain in right arm: Secondary | ICD-10-CM

## 2020-11-08 DIAGNOSIS — S96919A Strain of unspecified muscle and tendon at ankle and foot level, unspecified foot, initial encounter: Secondary | ICD-10-CM | POA: Diagnosis not present

## 2020-11-08 NOTE — Therapy (Signed)
[facility] [address] [place], [place], [address] Phone: [phone]   Fax:  [phone]  Physical Therapy Treatment  Patient Details  Name: Amanda Humphrey MRN: [id number] Date of Birth: 07/10/1965 Referring Provider (PT): Marianne Paul, PA-C   Encounter Date: 11/08/2020   PT End of Session - 11/08/20 0804     Visit Number 18    Number of Visits 24    Date for PT Re-Evaluation 11/27/20    Authorization - Visit Number 8    Authorization - Number of Visits 10    PT Start Time 0732    PT Stop Time 0817    PT Time Calculation (min) 45 min    Activity Tolerance Patient tolerated treatment well;Patient limited by pain    Behavior During Therapy WFL for tasks assessed/performed             Past Medical History:  Diagnosis Date   Allergy    Chronic kidney disease    stones   Fibrocystic breast disease 2013   Meniere disease    Migraines     Past Surgical History:  Procedure Laterality Date   ABDOMINAL HYSTERECTOMY  2011   laprascopic supracervical, [place]   APPENDECTOMY  1999   BREAST BIOPSY Right Nov 2012    fibrocystic disease, [place]   CESAREAN SECTION     4 total   CHOLECYSTECTOMY  2011   TONSILLECTOMY AND ADENOIDECTOMY  1975    There were no vitals filed for this visit.     Pt pesents to tx with mild improvement in pain and mobility. Pt states that TDN helped with with nerve like pain and would like to attempt more during today's tx. Pt states neck pain at the start of tx 6-7/10.          reatment Therapeutic Exercise:   All therex completed in supine with pillow roll to support the natural curve of the cervical spine. All movement completed with verbal/contact cueing to ensure optimal movement within a pain limited range. Each movement x30 1) wood dowel shoulder flexion  2) 2# chest press  3) 2# bilat shoulder flexoin to 90 deg  4) 2# bicep curls  5) 3# serratus anter punch at 90 deg     Manual Therapy:  Supine STM to L upper trap, levator scapulae, Paracervical, and suboccipitals.     Trigger Point Dry Needling (TDN) Education performed with patient regarding potential benefit of TDN. Reviewed precautions and risks with patient. Reviewed special precautions/risks over lung fields which include pneumothorax. Reviewed signs and symptoms of pneumothorax and advised pt to go to ER immediately if these symptoms develop advise them of dry needling treatment. Pt provided verbal consent to treatment. TDN performed to L UT/ levator musculature with 4 needles, 0.25 x 60 L-type single needle placements on each side with 2 local twitch response (LTR). Pistoning technique utilized.           PT Long Term Goals - 11/01/20 1120       PT LONG TERM GOAL #1   Title Pt. will improve FOTO score to >62 to improve independence with ADLs *foot*    Baseline 42 9/6: 57    Time 6    Period Weeks    Status On-going    Target Date 11/27/20      PT LONG TERM GOAL #2   Title Pt will improve R ankle MMT to qual to L to faciliate increased stability for return to   walks [place] the beach.    Baseline R/L: 3/5 Ankle dorsiflexion    5/25 bilat Sl calf raise Ankle plantarflexion  *pain with all MMT testing of the ankle/foot*  3/5 ankle inversion   3/5 ankle eversion 9/6: R/L: 4+/5 Ankle dorsiflexion    8/25 bilat Sl calf raise Ankle plantarflexion  *pain with all MMT testing of the ankle/foot*  4/5 ankle inversion   4/5 ankle eversion    Time 6    Period Weeks    Status Partially Met    Target Date 11/27/20      PT LONG TERM GOAL #3   Title Pt will be able to complete 20 steps with R ankle pain >3/10 to facilitate greater mobility within the home.    Baseline 7-8/10 NPS 9/6: 2/10 at the lowest. 8 at the worse. Stair climbing is very difficult    Time 6    Period Weeks    Status Partially Met    Target Date 11/27/20      PT LONG TERM GOAL #4   Title Pt will be able to complete work/home  mobility tasks with laced ankle braced doff >50% for return to work specific foot wear.    Baseline Pt current needs to wear high cushion tennis shoes to work, brace donned 100% 9/6: laced brace doffed during ADLs the last 4 days. PT will wear brace during long work days.    Time 6    Period Weeks    Status Partially Met    Target Date 11/27/20      PT LONG TERM GOAL #5   Title Pt will improve FOTO score to predicted improvement value of 62 in regards to cervical impairments.    Baseline 52    Time 4    Period Weeks    Status New    Target Date 11/29/20      Additional Long Term Goals   Additional Long Term Goals Yes      PT LONG TERM GOAL #6   Title Pt will improve worse cervical pain by atleast 3 NPS points to allow increased tolerance during work and home related ADLs.    Baseline NPS: 8/10 current, 7/10 best, 10/10 Worse.    Time 4    Period Weeks    Status New    Target Date 11/29/20      PT LONG TERM GOAL #7   Title Pt will improve LUE MMT equal to RUE to greater functional abilities with ADLs related to caregiving    Baseline UE MMT R/L:   Shoulder Flexion: 4/4-  Shoulder abduction: 4/4-  Shoulder ER: 4/4  Shoulder IR: 4/4  Elbow ext: 5/5   Eblow flex: 5/5    Time 4    Period Weeks    Status New    Target Date 11/29/20      PT LONG TERM GOAL #8   Title Pt will improve functional R hand strength equal to L to promote return of hand useage during grooming and feeding.    Baseline Grip R/L: 76.1 / 33.9    Lateral prehension grip R/L: 14 / 4    Time 4    Period Weeks    Status New    Target Date 11/29/20                   Plan - 11/08/20 0804     Clinical Impression Statement Pt tolerated addition of therex well with only minor increase in L upper   trap stiffness that was returned to baseline s/p manual intervention. Pt continues to display  marked pain and reduction functional mobility because of the pain. Pt continues to display mark Cervical/LE/UE  endurance, strength, and ROM deficits resulting in decreased safe home/community mobiity, increased pain, and limited access to QOL. Pt. will continue to benefit from skilled physical therapy to progress POC to address remaining deficits to facilitate maximum functional capacity for optimal personal health and wellness for ADLs.    Personal Factors and Comorbidities Comorbidity 2;Profession;Past/Current Experience    Comorbidities Hypertension, Obesity    Examination-Activity Limitations Squat;Locomotion Level    Examination-Participation Restrictions Occupation;Cleaning;Laundry    Stability/Clinical Decision Making Evolving/Moderate complexity    Clinical Decision Making Moderate    Rehab Potential Good    PT Frequency 2x / week    PT Duration 6 weeks    PT Treatment/Interventions ADLs/Self Care Home Management;Aquatic Therapy;Biofeedback;Cryotherapy;Electrical Stimulation;Moist Heat;Ultrasound;Functional mobility training;Therapeutic activities;Therapeutic exercise;Balance training;Neuromuscular re-education;Patient/family education;Manual techniques;Scar mobilization;Passive range of motion;Dry needling;Energy conservation;Taping;Gait training;Stair training;DME Instruction;Spinal Manipulations;Joint Manipulations    PT Next Visit Plan Discuss d/n to upper trap/cervical spine.    PT Home Exercise Plan [id number]    Consulted and Agree with Plan of Care Patient             Patient will benefit from skilled therapeutic intervention in order to improve the following deficits and impairments:  Decreased mobility, Decreased endurance, Hypomobility, Decreased range of motion, Decreased scar mobility, Improper body mechanics, Postural dysfunction, Impaired flexibility, Decreased strength, Decreased activity tolerance, Abnormal gait, Decreased balance, Difficulty walking, Increased edema, Impaired perceived functional ability  Visit Diagnosis: Pain in right ankle and joints of right foot  Joint  stiffness of spine  Ankle weakness  Sprain and strain of ankle  Pain in right arm  Muscle weakness (generalized)     Problem List Patient Active Problem List   Diagnosis Date Noted   H/O of hemilaminectomy 02/15/2019   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   Fatigue 09/23/2017   History of pneumonia 04/30/2016   Grief reaction 04/30/2016   Vitamin D deficiency 02/22/2015   Hyperlipidemia 01/01/2015   Visit for preventive health examination 12/04/2013   Lower extremity edema 09/02/2013   Essential hypertension 09/02/2013   History of cardiomyopathy 09/02/2013   SVT (supraventricular tachycardia) (HCC) 09/02/2013   Palpitations 09/02/2013   Contrast media allergy 03/10/2013   Fibrocystic breast disease    Morbid obesity (HCC) 02/05/2012   Meniere disease    Michael C Sherk, PT, DPT # [id number] Durand Shoup, SPT 11/09/2020, 3:59 PM  [facility] [address] [place], [place], [address] Phone: [phone]   Fax:  [phone]  Name: Amanda Humphrey MRN: [id number] Date of Birth: 03/17/1965    

## 2020-11-13 ENCOUNTER — Encounter: Payer: Self-pay | Admitting: Physical Therapy

## 2020-11-13 ENCOUNTER — Ambulatory Visit: Payer: BC Managed Care – PPO | Admitting: Physical Therapy

## 2020-11-13 ENCOUNTER — Other Ambulatory Visit: Payer: Self-pay

## 2020-11-13 DIAGNOSIS — M25571 Pain in right ankle and joints of right foot: Secondary | ICD-10-CM | POA: Diagnosis not present

## 2020-11-13 DIAGNOSIS — S93409A Sprain of unspecified ligament of unspecified ankle, initial encounter: Secondary | ICD-10-CM

## 2020-11-13 DIAGNOSIS — S96919A Strain of unspecified muscle and tendon at ankle and foot level, unspecified foot, initial encounter: Secondary | ICD-10-CM | POA: Diagnosis not present

## 2020-11-13 DIAGNOSIS — R29898 Other symptoms and signs involving the musculoskeletal system: Secondary | ICD-10-CM

## 2020-11-13 DIAGNOSIS — M79601 Pain in right arm: Secondary | ICD-10-CM

## 2020-11-13 DIAGNOSIS — M6281 Muscle weakness (generalized): Secondary | ICD-10-CM | POA: Diagnosis not present

## 2020-11-13 DIAGNOSIS — M2560 Stiffness of unspecified joint, not elsewhere classified: Secondary | ICD-10-CM

## 2020-11-13 NOTE — Therapy (Signed)
[facility] [address] [place], [place], [address] Phone: [phone]   Fax:  [phone]  Physical Therapy Treatment  Patient Details  Name: Amanda Humphrey MRN: [id number] Date of Birth: Nov 18, 1965 Referring Provider (PT): Orbie Pyo, [place]   Encounter Date: 11/13/2020   PT End of Session - 11/13/20 0813     Visit Number 19    Number of Visits 24    Date for PT Re-Evaluation 11/27/20    Authorization - Visit Number 9    Authorization - Number of Visits 10    PT Start Time 3976    PT Stop Time 0818    PT Time Calculation (min) 45 min    Activity Tolerance Patient tolerated treatment well;Patient limited by pain    Behavior During Therapy [facility] for tasks assessed/performed             Past Medical History:  Diagnosis Date   Allergy    Chronic kidney disease    stones   Fibrocystic breast disease 2013   Meniere disease    Migraines     Past Surgical History:  Procedure Laterality Date   ABDOMINAL HYSTERECTOMY  2011   laprascopic supracervical, [place]   APPENDECTOMY  1999   BREAST BIOPSY Right Nov 2012    fibrocystic disease, [place]   CESAREAN SECTION     4 total   CHOLECYSTECTOMY  2011   [place]    There were Humphrey vitals filed for this visit.   Subjective Assessment - 11/13/20 0754     Subjective Pt presents with reports of improved cervical sx. Pt states that TDN and STM to L shoulder/neck has greatly imporve her sx. Pt states that she had a long day at work with slight increase in sx to 6/10.    Pertinent History MRI results: Rupture of R ant talofibular ligament. History of severe neck pain that causes radiation to the R UE. Cervical Srugery C6-T1(early 2021). History of bilat plantar fasciitis and heel spurs. Pt's spouse has health complication resulting with increased home ADL reponsibilies. Pt enjoys visiting and walking on the beach, which she is currently unable to do at  this time 2/2 ankle instability and pain.    Limitations Lifting;House hold activities    How long can you sit comfortably? WNL    How long can you stand comfortably? WNL with brace, fear of movement without brace.    How long can you walk comfortably? WNL with brace, fear of movement without brace.    Diagnostic tests MRI    Patient Stated Goals Decrease muscle spasms, return to work    Currently in Pain? Yes    Pain Score 6     Pain Location Neck    Pain Orientation Mid;Left;Lower    Pain Descriptors / Indicators Aching    Pain Onset More than a month ago    Pain Onset More than a month ago               Treatment Therapeutic Exercise:     All therex completed in supine with pillow roll to support the natural curve of the cervical spine. All movement completed with verbal/contact cueing to ensure optimal movement within a pain limited range. Each movement x30 1) wood dowel shoulder flexion  2) 3# chest press  3) 3# bilat shoulder flexion to 90 deg  4) 3 # bicep curls  5) 3# serratus anterior punch at 90 deg  Manual Therapy:   Seated STM to L upper trap, levator scapulae, rhomboid, teres minor, and infraspinatus.      Trigger Point Dry Needling (TDN)  Education performed with patient regarding potential benefit of TDN. Reviewed precautions and risks with patient. Reviewed special precautions/risks over lung fields which include pneumothorax. Reviewed signs and symptoms of pneumothorax and advised pt to go to ER immediately if these symptoms develop advise them of dry needling treatment. Pt provided verbal consent to treatment. TDN performed to L UT/ levator musculature with 3 needles, 0.25 x 60 L-type single needle placements on each side with 2 local twitch response (LTR). Pistoning technique utilized.  Pt. Reports increase discomfort during TDN today as compared to last tx. Session.             PT Long Term Goals - 11/01/20 1120       PT LONG TERM GOAL #1    Title Pt. will improve FOTO score to >62 to improve independence with ADLs *foot*    Baseline 42 9/6: 57    Time 6    Period Weeks    Status On-going    Target Date 11/27/20      PT LONG TERM GOAL #2   Title Pt will improve R ankle MMT to qual to L to faciliate increased stability for return to walks on the beach.    Baseline R/L: 3/5 Ankle dorsiflexion    5/25 bilat Sl calf raise Ankle plantarflexion  *pain with all MMT testing of the ankle/foot*  3/5 ankle inversion   3/5 ankle eversion 9/6: R/L: 4+/5 Ankle dorsiflexion    8/25 bilat Sl calf raise Ankle plantarflexion  *pain with all MMT testing of the ankle/foot*  4/5 ankle inversion   4/5 ankle eversion    Time 6    Period Weeks    Status Partially Met    Target Date 11/27/20      PT LONG TERM GOAL #3   Title Pt will be able to complete 20 steps with R ankle pain >3/10 to facilitate greater mobility within the home.    Baseline 7-8/10 NPS 9/6: 2/10 at the lowest. 8 at the worse. Stair climbing is very difficult    Time 6    Period Weeks    Status Partially Met    Target Date 11/27/20      PT LONG TERM GOAL #4   Title Pt will be able to complete work/home mobility tasks with laced ankle braced doff >50% for return to work specific foot wear.    Baseline Pt current needs to wear high cushion tennis shoes to work, brace donned 100% 9/6: laced brace doffed during ADLs the last 4 days. PT will wear brace during long work days.    Time 6    Period Weeks    Status Partially Met    Target Date 11/27/20      PT LONG TERM GOAL #5   Title Pt will improve FOTO score to predicted improvement value of 62 in regards to cervical impairments.    Baseline 52    Time 4    Period Weeks    Status New    Target Date 11/29/20      Additional Long Term Goals   Additional Long Term Goals Yes      PT LONG TERM GOAL #6   Title Pt will improve worse cervical pain by atleast 3 NPS points to allow increased tolerance during work and home related  ADLs.  Baseline NPS: 8/10 current, 7/10 best, 10/10 Worse.    Time 4    Period Weeks    Status New    Target Date 11/29/20      PT LONG TERM GOAL #7   Title Pt will improve LUE MMT equal to RUE to greater functional abilities with ADLs related to caregiving    Baseline UE MMT R/L:   Shoulder Flexion: 4/4-  Shoulder abduction: 4/4-  Shoulder ER: 4/4  Shoulder IR: 4/4  Elbow ext: 5/5   Eblow flex: 5/5    Time 4    Period Weeks    Status New    Target Date 11/29/20      PT LONG TERM GOAL #8   Title Pt will improve functional R hand strength equal to L to promote return of hand useage during grooming and feeding.    Baseline Grip R/L: 76.1 / 33.9    Lateral prehension grip R/L: 14 / 4    Time 4    Period Weeks    Status New    Target Date 11/29/20                   Plan - 11/13/20 0813     Clinical Impression Statement Pt displayed improved activity tolerance with increase in UE strengthening to 3# bilat from 2# bilat. Pt states increased difficulty with therex without increase in pain. Pt had a slight increase in pain during TDN today over UT region as compared to last tx. session.  Pt.  continues to display mark cervical and LUE endurance, strength, and ROM deficits resulting in decreased safe home/community mobiity, increased pain, and limited access to QOL. Pt. will continue to benefit from skilled physical therapy to progress POC to address remaining deficits to facilitate maximum functional capacity for optimal personal health and wellness for ADLs.    Personal Factors and Comorbidities Comorbidity 2;Profession;Past/Current Experience    Comorbidities Hypertension, Obesity    Examination-Activity Limitations Squat;Locomotion Level    Examination-Participation Restrictions Occupation;Cleaning;Laundry    Stability/Clinical Decision Making Evolving/Moderate complexity    Clinical Decision Making Moderate    Rehab Potential Good    PT Frequency 2x / week    PT Duration 6  weeks    PT Treatment/Interventions ADLs/Self Care Home Management;Aquatic Therapy;Biofeedback;Cryotherapy;Electrical Stimulation;Moist Heat;Ultrasound;Functional mobility training;Therapeutic activities;Therapeutic exercise;Balance training;Neuromuscular re-education;Patient/family education;Manual techniques;Scar mobilization;Passive range of motion;Dry needling;Energy conservation;Taping;Gait training;Stair training;DME Instruction;Spinal Manipulations;Joint Manipulations    PT Next Visit Plan Discuss d/n to upper trap/cervical spine.    PT Home Exercise Plan [id number]    Consulted and Agree with Plan of Care Patient             Patient will benefit from skilled therapeutic intervention in order to improve the following deficits and impairments:  Decreased mobility, Decreased endurance, Hypomobility, Decreased range of motion, Decreased scar mobility, Improper body mechanics, Postural dysfunction, Impaired flexibility, Decreased strength, Decreased activity tolerance, Abnormal gait, Decreased balance, Difficulty walking, Increased edema, Impaired perceived functional ability  Visit Diagnosis: Pain in right ankle and joints of right foot  Pain in right arm  Muscle weakness (generalized)  Joint stiffness of spine  Ankle weakness  Sprain and strain of ankle     Problem List Patient Active Problem List   Diagnosis Date Noted   H/O of hemilaminectomy 02/15/2019   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[place]) 11/17/2018   Fatigue 09/23/2017   History of pneumonia 04/30/2016   Grief reaction 04/30/2016   Vitamin D deficiency 02/22/2015   Hyperlipidemia 01/01/2015  Visit for preventive health examination 12/04/2013   Lower extremity edema 09/02/2013   Essential hypertension 09/02/2013   History of cardiomyopathy 09/02/2013   SVT (supraventricular tachycardia) ([place]) 09/02/2013   Palpitations 09/02/2013   Contrast media allergy 03/10/2013   Fibrocystic breast disease    Morbid  obesity ([place]) 02/05/2012   Meniere disease    Pura Spice, PT, DPT # [id number] Fara Olden, SPT 11/13/2020, 11:39 AM  [facility] [address] [place], [place], [address] Phone: [phone]   Fax:  [phone]  Name: Amanda Humphrey MRN: [id number] Date of Birth: 01/01/1966

## 2020-11-15 ENCOUNTER — Other Ambulatory Visit: Payer: Self-pay

## 2020-11-15 ENCOUNTER — Encounter: Payer: Self-pay | Admitting: Physical Therapy

## 2020-11-15 ENCOUNTER — Ambulatory Visit: Payer: BC Managed Care – PPO | Admitting: Physical Therapy

## 2020-11-15 DIAGNOSIS — S93409A Sprain of unspecified ligament of unspecified ankle, initial encounter: Secondary | ICD-10-CM

## 2020-11-15 DIAGNOSIS — M6281 Muscle weakness (generalized): Secondary | ICD-10-CM | POA: Diagnosis not present

## 2020-11-15 DIAGNOSIS — M25571 Pain in right ankle and joints of right foot: Secondary | ICD-10-CM

## 2020-11-15 DIAGNOSIS — M2560 Stiffness of unspecified joint, not elsewhere classified: Secondary | ICD-10-CM

## 2020-11-15 DIAGNOSIS — S96919A Strain of unspecified muscle and tendon at ankle and foot level, unspecified foot, initial encounter: Secondary | ICD-10-CM | POA: Diagnosis not present

## 2020-11-15 DIAGNOSIS — R29898 Other symptoms and signs involving the musculoskeletal system: Secondary | ICD-10-CM | POA: Diagnosis not present

## 2020-11-15 DIAGNOSIS — M79601 Pain in right arm: Secondary | ICD-10-CM

## 2020-11-15 NOTE — Therapy (Signed)
[facility] [address] [place], [place], [address] Phone: [phone]   Fax:  [phone]  Physical Therapy Treatment, Progress note, and R ankle discharge 10/04/2020-11/15/2020  Patient Details  Name: Amanda Humphrey MRN: [id number] Date of Birth: 05/02/1965 Referring Provider (PT): Orbie Pyo, [place]   Encounter Date: 11/15/2020   PT End of Session - 11/15/20 0911     Visit Number 20    Number of Visits 32    Date for PT Re-Evaluation 12/27/20    Authorization - Visit Number 10    Authorization - Number of Visits 10    PT Start Time 3557    PT Stop Time 3220    PT Time Calculation (min) 44 min    Activity Tolerance Patient tolerated treatment well;Patient limited by pain    Behavior During Therapy [facility] for tasks assessed/performed             Past Medical History:  Diagnosis Date   Allergy    Chronic kidney disease    stones   Fibrocystic breast disease 2013   Meniere disease    Migraines     Past Surgical History:  Procedure Laterality Date   ABDOMINAL HYSTERECTOMY  2011   laprascopic supracervical, [place]   APPENDECTOMY  1999   BREAST BIOPSY Right Nov 2012    fibrocystic disease, [place]   CESAREAN SECTION     4 total   CHOLECYSTECTOMY  2011   [place]    There were no vitals filed for this visit.   Subjective Assessment - 11/15/20 0736     Subjective Pt presents to tx with reports of increased soreness s/p DrN  the neck and L shoulder that continued into the following day. Pt states that sx are currently ISQ this morning. Pt states that her ankle is 85% from total POLF. Pt is agreeable to d/c the R ankle case and continued solely on remaining neck impairments.    Pertinent History MRI results: Rupture of R ant talofibular ligament. History of severe neck pain that causes radiation to the R UE. Cervical Srugery C6-T1(early 2021). History of bilat plantar fasciitis and heel  spurs. Pt's spouse has health complication resulting with increased home ADL reponsibilies. Pt enjoys visiting and walking on the beach, which she is currently unable to do at this time 2/2 ankle instability and pain.    Limitations Lifting;House hold activities    How long can you sit comfortably? WNL    How long can you stand comfortably? WNL with brace, fear of movement without brace.    How long can you walk comfortably? WNL with brace, fear of movement without brace.    Diagnostic tests MRI    Patient Stated Goals Decrease muscle spasms, return to work    Currently in Pain? Yes    Pain Score 6     Pain Location Neck    Pain Orientation Right;Mid;Lower    Pain Descriptors / Indicators Aching    Pain Onset More than a month ago    Pain Onset More than a month ago              Treatment and Reassessment:  R ankle reassessment:   Ankle FOTO: 65 (goal achieved on the 20th visit)   Ankle MMT: R Sl calf raise Ankle plantarflexion 8/25 *pain with all MMT testing of the ankle/foot* 4+/5 ankle inversion 4+/5 ankle eversion, 4+/5 ankle DF.  Steps/NPS: Pt was able to complete >  20 steps with 0/10.  Return to work/ brace adherence: Pt is currently not using laced ankle braced and able to complete all work related tasks.  Cervical reassessment   Cervical FOTO: 47  Worse NPS: 7/10  UE MMT: UE MMT R/L: Shoulder Flexion: 4/4 Shoulder abduction: 4/4 Shoulder ER: 4/4 Shoulder IR: 4/4 Elbow ext: 5/5 Eblow flex: 5/5  Grip/ hand strength: Grip R/L: 62.0 / 41.4 Lateral prehension grip R/L: 13 / 6           PT Education - 11/15/20 0824     Education Details Pt was educated the importance of continued HEP adherence to maintain therapeutic ankle gains.    Person(s) Educated Patient    Methods Explanation    Comprehension Verbalized understanding                 PT Long Term Goals - 11/15/20 0739       PT LONG TERM GOAL #1   Title Pt. will improve FOTO score to >62 to  improve independence with ADLs *foot*    Baseline 42 9/6: 57 10/20:65    Time 6    Period Weeks    Status Achieved    Target Date 11/15/20      PT LONG TERM GOAL #2   Title Pt will improve R ankle MMT to qual to L to faciliate increased stability for return to walks on the beach.    Baseline R/L: 3/5 Ankle dorsiflexion    5/25 bilat Sl calf raise Ankle plantarflexion  *pain with all MMT testing of the ankle/foot*  3/5 ankle inversion   3/5 ankle eversion 9/6: R/L: 4+/5 Ankle dorsiflexion    8/25 bilat Sl calf raise Ankle plantarflexion  *pain with all MMT testing of the ankle/foot*  4/5 ankle inversion   4/5 ankle eversion. 10/20: bilat Sl calf raise Ankle plantarflexion 8/25  *pain with all MMT testing of the ankle/foot*  4+/5 ankle inversion   4+/5 ankle eversion, 4+/5 ankle DF.    Time 6    Period Weeks    Status Partially Met    Target Date 11/15/20      PT LONG TERM GOAL #3   Title Pt will be able to complete 20 steps with R ankle pain >3/10 to facilitate greater mobility within the home.    Baseline 7-8/10 NPS 9/6: 2/10 at the lowest. 8 at the worse. Stair climbing is very difficult 10/20: Pt was able to complete >20 steps with 0/10.    Time 6    Period Weeks    Status Achieved    Target Date 11/15/20      PT LONG TERM GOAL #4   Title Pt will be able to complete work/home mobility tasks with laced ankle braced doff >50% for return to work specific foot wear.    Baseline Pt current needs to wear high cushion tennis shoes to work, brace donned 100% 9/6: laced brace doffed during ADLs the last 4 days. PT will wear brace during long work days.10/20: Pt is currently not using laced ankle braced and able to complete all work related tasks.    Time 6    Period Weeks    Status Achieved    Target Date 11/15/20      PT LONG TERM GOAL #5   Title Pt will improve FOTO score to predicted improvement value of 62 in regards to cervical impairments.    Baseline 52 10/20:47    Time 6    Period  Weeks    Status Not Met    Target Date 12/27/20      PT LONG TERM GOAL #6   Title Pt will improve worse cervical pain by atleast 3 NPS points to allow increased tolerance during work and home related ADLs.    Baseline NPS: 8/10 current, 7/10 best, 10/10 Worse. 10/20: Worse pain 7/10    Time 6    Period Weeks    Status On-going    Target Date 12/27/20      PT LONG TERM GOAL #7   Title Pt will improve bilat UE MMT by atleast 1/2 grade to facilitate greater functional abilities with ADLs related to caregiving.    Baseline UE MMT R/L:   Shoulder Flexion: 4/4-  Shoulder abduction: 4/4-  Shoulder ER: 4/4  Shoulder IR: 4/4  Elbow ext: 5/5   Eblow flex: 5/5 10/20: UE MMT R/L:   Shoulder Flexion: 4/4  Shoulder abduction: 4/4 Shoulder ER: 4/4  Shoulder IR: 4/4  Elbow ext: 5/5   Eblow flex: 5/5    Time 6    Period Weeks    Status Partially Met    Target Date 12/27/20      PT LONG TERM GOAL #8   Title Pt will improve functional R hand strength equal to L to promote return of hand useage during grooming and feeding.    Baseline Grip R/L: 76.1 / 33.9    Lateral prehension grip R/L: 14 / 4 10/20: Grip R/L: 62.0 / 41.4    Lateral prehension grip R/L: 13 / 6    Time 6    Period Weeks    Status Partially Met    Target Date 12/27/20                   Plan - 11/15/20 0913     Clinical Impression Statement Pt was reassessed on the Date, 11/15/2020. R ankle case was d/c with 3/4 goals met. Ankle FOTO: 65 (goal achieved on the 20th visit). Ankle MMT: R Sl calf raise Ankle plantarflexion 8/25 *pain with all MMT testing of the ankle/foot* 4+/5 ankle inversion 4+/5 ankle eversion, 4+/5 ankle DF. ?Steps/NPS: Pt was able to complete >20 steps with 0/10. Return to work/ brace adherence: Pt is currently not using laced ankle braced and able to complete all work related tasks. Cervical FOTO: 47. Worse NPS: 7/10. UE MMT: UE MMT R/L: Shoulder Flexion: 4/4 Shoulder abduction: 4/4 Shoulder ER: 4/4 Shoulder IR:  4/4 Elbow ext: 5/5 Eblow flex: 5/5. Grip/ hand strength: Grip R/L: 62.0 / 41.4 Lateral prehension grip R/L: 13 / 6. Pt R ankle case was d/c due to progression of attention needed at the cervical spine and 3/4 goals met. Pt was agreeable to d/c of R ankle and reported that her ankle is functional at this time. Reassessment of the cervical spine displayed marked cervical and LUE endurance, strength, and ROM deficits resulting in decreased safe home/community mobiity, increased pain, and limited access to QOL. Pt. will continue to benefit from skilled physical therapy for an additional 6 weeks at 2x /week. to progress POC to address remaining deficits to facilitate maximum functional capacity for optimal personal health and wellness for ADLs.    Personal Factors and Comorbidities Comorbidity 2;Profession;Past/Current Experience    Comorbidities Hypertension, Obesity    Examination-Activity Limitations Squat;Locomotion Level    Examination-Participation Restrictions Occupation;Cleaning;Laundry    Stability/Clinical Decision Making Evolving/Moderate complexity    Clinical Decision Making Moderate    Rehab Potential Good  PT Frequency 2x / week    PT Duration 6 weeks    PT Treatment/Interventions ADLs/Self Care Home Management;Aquatic Therapy;Biofeedback;Cryotherapy;Electrical Stimulation;Moist Heat;Ultrasound;Functional mobility training;Therapeutic activities;Therapeutic exercise;Balance training;Neuromuscular re-education;Patient/family education;Manual techniques;Scar mobilization;Passive range of motion;Dry needling;Energy conservation;Taping;Gait training;Stair training;DME Instruction;Spinal Manipulations;Joint Manipulations    PT Next Visit Plan Possible update in cervical HEP, dependent on response to previous tx.    PT Home Exercise Plan [id number]    Consulted and Agree with Plan of Care Patient             Patient will benefit from skilled therapeutic intervention in order to improve the  following deficits and impairments:  Decreased mobility, Decreased endurance, Hypomobility, Decreased range of motion, Decreased scar mobility, Improper body mechanics, Postural dysfunction, Impaired flexibility, Decreased strength, Decreased activity tolerance, Abnormal gait, Decreased balance, Difficulty walking, Increased edema, Impaired perceived functional ability  Visit Diagnosis: Pain in right ankle and joints of right foot  Ankle weakness  Sprain and strain of ankle  Pain in right arm  Muscle weakness (generalized)  Joint stiffness of spine     Problem List Patient Active Problem List   Diagnosis Date Noted   H/O of hemilaminectomy 02/15/2019   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[place]) 11/17/2018   Fatigue 09/23/2017   History of pneumonia 04/30/2016   Grief reaction 04/30/2016   Vitamin D deficiency 02/22/2015   Hyperlipidemia 01/01/2015   Visit for preventive health examination 12/04/2013   Lower extremity edema 09/02/2013   Essential hypertension 09/02/2013   History of cardiomyopathy 09/02/2013   SVT (supraventricular tachycardia) ([place]) 09/02/2013   Palpitations 09/02/2013   Contrast media allergy 03/10/2013   Fibrocystic breast disease    Morbid obesity ([place]) 02/05/2012   Meniere disease    Pura Spice, PT, DPT # [id number] Fara Olden, SPT 11/15/2020, 7:06 PM  [facility] [address] [place], [place], [address] Phone: [phone]   Fax:  [phone]  Name: Amanda Humphrey MRN: [id number] Date of Birth: September 08, 1965

## 2020-11-20 ENCOUNTER — Other Ambulatory Visit: Payer: Self-pay

## 2020-11-20 ENCOUNTER — Encounter: Payer: Self-pay | Admitting: Physical Therapy

## 2020-11-20 ENCOUNTER — Ambulatory Visit: Payer: BC Managed Care – PPO

## 2020-11-20 DIAGNOSIS — R29898 Other symptoms and signs involving the musculoskeletal system: Secondary | ICD-10-CM | POA: Diagnosis not present

## 2020-11-20 DIAGNOSIS — S93409A Sprain of unspecified ligament of unspecified ankle, initial encounter: Secondary | ICD-10-CM | POA: Diagnosis not present

## 2020-11-20 DIAGNOSIS — S96919A Strain of unspecified muscle and tendon at ankle and foot level, unspecified foot, initial encounter: Secondary | ICD-10-CM | POA: Diagnosis not present

## 2020-11-20 DIAGNOSIS — M6281 Muscle weakness (generalized): Secondary | ICD-10-CM

## 2020-11-20 DIAGNOSIS — M2560 Stiffness of unspecified joint, not elsewhere classified: Secondary | ICD-10-CM

## 2020-11-20 DIAGNOSIS — M25571 Pain in right ankle and joints of right foot: Secondary | ICD-10-CM | POA: Diagnosis not present

## 2020-11-20 DIAGNOSIS — M79601 Pain in right arm: Secondary | ICD-10-CM | POA: Diagnosis not present

## 2020-11-20 NOTE — Therapy (Signed)
[facility] [address] [place], [place], [address] Phone: [phone]   Fax:  [phone]  Physical Therapy Treatment  Patient Details  Name: Amanda Humphrey MRN: [id number] Date of Birth: 09-24-1965 Referring Provider (PT): Orbie Pyo, [place]   Encounter Date: 11/20/2020   PT End of Session - 11/20/20 1619     Visit Number 21    Number of Visits 32    Date for PT Re-Evaluation 12/27/20    Authorization - Visit Number 1    Authorization - Number of Visits 10    PT Start Time 1696    PT Stop Time 1430    PT Time Calculation (min) 43 min    Activity Tolerance Patient tolerated treatment well;Patient limited by pain    Behavior During Therapy [facility] for tasks assessed/performed             Past Medical History:  Diagnosis Date   Allergy    Chronic kidney disease    stones   Fibrocystic breast disease 2013   Meniere disease    Migraines     Past Surgical History:  Procedure Laterality Date   ABDOMINAL HYSTERECTOMY  2011   laprascopic supracervical, [place]   APPENDECTOMY  1999   BREAST BIOPSY Right Nov 2012    fibrocystic disease, [place]   CESAREAN SECTION     4 total   CHOLECYSTECTOMY  2011   [place]    There were no vitals filed for this visit.   Subjective Assessment - 11/20/20 1350     Subjective Pt presents to tx with reports of 5-6/10 cervical pain that is located on the L side of the neck and into the upper trap. Pt states better adherence to sleep with some nights sleeping well and other sleep disturbances 2/2 L shoulder pain. Pt would like to discontinue DrN until reg attending PT is presents.    Pertinent History MRI results: Rupture of R ant talofibular ligament. History of severe neck pain that causes radiation to the R UE. Cervical Srugery C6-T1(early 2021). History of bilat plantar fasciitis and heel spurs. Pt's spouse has health complication resulting with increased  home ADL reponsibilies. Pt enjoys visiting and walking on the beach, which she is currently unable to do at this time 2/2 ankle instability and pain.    Limitations Lifting;House hold activities    How long can you sit comfortably? WNL    How long can you stand comfortably? WNL with brace, fear of movement without brace.    How long can you walk comfortably? WNL with brace, fear of movement without brace.    Diagnostic tests MRI    Patient Stated Goals Decrease muscle spasms, return to work    Currently in Pain? Yes    Pain Score 6     Pain Location Neck    Pain Orientation Right;Lower;Mid    Pain Descriptors / Indicators Aching    Pain Type Acute pain    Pain Onset More than a month ago    Pain Onset More than a month ago              Treatment Therapeutic Exercise:     All therex completed in supine with pillow roll to support the natural curve of the cervical spine. All movement completed with verbal/contact cueing to ensure optimal movement within a pain limited range. Each movement x30 1) 3# bilat shoulder flexion to 90 deg   2) 3# chest  press   3) 3 # bicep curls  4) 3# serratus anterior punch at 90 deg bilat UE at the same time     Manual Therapy 22 mins :   Prone IASTM with hypervolt to L upper trap, levator scapulae, rhomboid, teres minor, and infraspinatus.   Supine STM to suboccipitals with intermittent manual cervical traction for decreased tension and related headaches.      PT Education - 11/20/20 1618     Education Details form/technique with exercise.    Person(s) Educated Patient    Methods Explanation;Demonstration;Tactile cues;Verbal cues    Comprehension Verbalized understanding                 PT Long Term Goals - 11/15/20 0739       PT LONG TERM GOAL #1   Title Pt. will improve FOTO score to >62 to improve independence with ADLs *foot*    Baseline 42 9/6: 57 10/20:65    Time 6    Period Weeks    Status Achieved    Target Date  11/15/20      PT LONG TERM GOAL #2   Title Pt will improve R ankle MMT to qual to L to faciliate increased stability for return to walks on the beach.    Baseline R/L: 3/5 Ankle dorsiflexion    5/25 bilat Sl calf raise Ankle plantarflexion  *pain with all MMT testing of the ankle/foot*  3/5 ankle inversion   3/5 ankle eversion 9/6: R/L: 4+/5 Ankle dorsiflexion    8/25 bilat Sl calf raise Ankle plantarflexion  *pain with all MMT testing of the ankle/foot*  4/5 ankle inversion   4/5 ankle eversion. 10/20: bilat Sl calf raise Ankle plantarflexion 8/25  *pain with all MMT testing of the ankle/foot*  4+/5 ankle inversion   4+/5 ankle eversion, 4+/5 ankle DF.    Time 6    Period Weeks    Status Partially Met    Target Date 11/15/20      PT LONG TERM GOAL #3   Title Pt will be able to complete 20 steps with R ankle pain >3/10 to facilitate greater mobility within the home.    Baseline 7-8/10 NPS 9/6: 2/10 at the lowest. 8 at the worse. Stair climbing is very difficult 10/20: Pt was able to complete >20 steps with 0/10.    Time 6    Period Weeks    Status Achieved    Target Date 11/15/20      PT LONG TERM GOAL #4   Title Pt will be able to complete work/home mobility tasks with laced ankle braced doff >50% for return to work specific foot wear.    Baseline Pt current needs to wear high cushion tennis shoes to work, brace donned 100% 9/6: laced brace doffed during ADLs the last 4 days. PT will wear brace during long work days.10/20: Pt is currently not using laced ankle braced and able to complete all work related tasks.    Time 6    Period Weeks    Status Achieved    Target Date 11/15/20      PT LONG TERM GOAL #5   Title Pt will improve FOTO score to predicted improvement value of 62 in regards to cervical impairments.    Baseline 52 10/20:47    Time 6    Period Weeks    Status Not Met    Target Date 12/27/20      PT LONG TERM GOAL #6   Title  Pt will improve worse cervical pain by atleast  3 NPS points to allow increased tolerance during work and home related ADLs.    Baseline NPS: 8/10 current, 7/10 best, 10/10 Worse. 10/20: Worse pain 7/10    Time 6    Period Weeks    Status On-going    Target Date 12/27/20      PT LONG TERM GOAL #7   Title Pt will improve bilat UE MMT by atleast 1/2 grade to facilitate greater functional abilities with ADLs related to caregiving.    Baseline UE MMT R/L:   Shoulder Flexion: 4/4-  Shoulder abduction: 4/4-  Shoulder ER: 4/4  Shoulder IR: 4/4  Elbow ext: 5/5   Eblow flex: 5/5 10/20: UE MMT R/L:   Shoulder Flexion: 4/4  Shoulder abduction: 4/4 Shoulder ER: 4/4  Shoulder IR: 4/4  Elbow ext: 5/5   Eblow flex: 5/5    Time 6    Period Weeks    Status Partially Met    Target Date 12/27/20      PT LONG TERM GOAL #8   Title Pt will improve functional R hand strength equal to L to promote return of hand useage during grooming and feeding.    Baseline Grip R/L: 76.1 / 33.9    Lateral prehension grip R/L: 14 / 4 10/20: Grip R/L: 62.0 / 41.4    Lateral prehension grip R/L: 13 / 6    Time 6    Period Weeks    Status Partially Met    Target Date 12/27/20                   Plan - 11/20/20 1700     Clinical Impression Statement Today's tx was focused on progression of bilat UE strengthening in a support supine position. Pt states increase in suboccipital tension and pain post bilat serratus ant punch. Pt states reduction of sx to baseline post STM in supine. Pt states mild reduction in L shoulder stiffness and pain post IASTM. Pt continues to display mark cervical and L shoulder endurance, strength, and ROM deficits resulting in decreased safe home/community mobiity, increased pain, and limited access to QOL. Pt. will continue to benefit from skilled physical therapy to progress POC to address remaining deficits to facilitate maximum functional capacity for optimal personal health and wellness for ADLs.    Personal Factors and Comorbidities  Comorbidity 2;Profession;Past/Current Experience    Comorbidities Hypertension, Obesity    Examination-Activity Limitations Squat;Locomotion Level    Examination-Participation Restrictions Occupation;Cleaning;Laundry    Stability/Clinical Decision Making Evolving/Moderate complexity    Clinical Decision Making Moderate    Rehab Potential Good    PT Frequency 2x / week    PT Duration 6 weeks    PT Treatment/Interventions ADLs/Self Care Home Management;Aquatic Therapy;Biofeedback;Cryotherapy;Electrical Stimulation;Moist Heat;Ultrasound;Functional mobility training;Therapeutic activities;Therapeutic exercise;Balance training;Neuromuscular re-education;Patient/family education;Manual techniques;Scar mobilization;Passive range of motion;Dry needling;Energy conservation;Taping;Gait training;Stair training;DME Instruction;Spinal Manipulations;Joint Manipulations    PT Next Visit Plan Possible update in cervical HEP, dependent on response to previous tx.    PT Home Exercise Plan [id number]    Consulted and Agree with Plan of Care Patient             Patient will benefit from skilled therapeutic intervention in order to improve the following deficits and impairments:  Decreased mobility, Decreased endurance, Hypomobility, Decreased range of motion, Decreased scar mobility, Improper body mechanics, Postural dysfunction, Impaired flexibility, Decreased strength, Decreased activity tolerance, Abnormal gait, Decreased balance, Difficulty walking, Increased edema, Impaired perceived functional ability  Visit Diagnosis: Joint stiffness of spine  Pain in right arm  Muscle weakness (generalized)     Problem List Patient Active Problem List   Diagnosis Date Noted   H/O of hemilaminectomy 02/15/2019   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[place]) 11/17/2018   Fatigue 09/23/2017   History of pneumonia 04/30/2016   Grief reaction 04/30/2016   Vitamin D deficiency 02/22/2015   Hyperlipidemia  01/01/2015   Visit for preventive health examination 12/04/2013   Lower extremity edema 09/02/2013   Essential hypertension 09/02/2013   History of cardiomyopathy 09/02/2013   SVT (supraventricular tachycardia) ([place]) 09/02/2013   Palpitations 09/02/2013   Contrast media allergy 03/10/2013   Fibrocystic breast disease    Morbid obesity ([place]) 02/05/2012   Meniere disease    Fara Olden, SPT  Medora Fairly IV, PT, DPT Physical Therapist- [facility]  11/20/2020, 8:39 PM  [facility] [facility] [address] [place], [place], [address] Phone: [phone]   Fax:  [phone]  Name: Amanda Humphrey MRN: [id number] Date of Birth: 27-Oct-1965

## 2020-11-22 ENCOUNTER — Ambulatory Visit: Payer: BC Managed Care – PPO | Admitting: Physical Therapy

## 2020-11-22 ENCOUNTER — Encounter: Payer: Self-pay | Admitting: Physical Therapy

## 2020-11-22 ENCOUNTER — Other Ambulatory Visit: Payer: Self-pay

## 2020-11-22 DIAGNOSIS — M6281 Muscle weakness (generalized): Secondary | ICD-10-CM | POA: Diagnosis not present

## 2020-11-22 DIAGNOSIS — M25571 Pain in right ankle and joints of right foot: Secondary | ICD-10-CM | POA: Diagnosis not present

## 2020-11-22 DIAGNOSIS — M79601 Pain in right arm: Secondary | ICD-10-CM

## 2020-11-22 DIAGNOSIS — M2560 Stiffness of unspecified joint, not elsewhere classified: Secondary | ICD-10-CM | POA: Diagnosis not present

## 2020-11-22 DIAGNOSIS — R29898 Other symptoms and signs involving the musculoskeletal system: Secondary | ICD-10-CM | POA: Diagnosis not present

## 2020-11-22 DIAGNOSIS — S93409A Sprain of unspecified ligament of unspecified ankle, initial encounter: Secondary | ICD-10-CM | POA: Diagnosis not present

## 2020-11-22 DIAGNOSIS — S96919A Strain of unspecified muscle and tendon at ankle and foot level, unspecified foot, initial encounter: Secondary | ICD-10-CM | POA: Diagnosis not present

## 2020-11-22 NOTE — Therapy (Signed)
[place] [facility] [address] [place], [place], [address] Phone: [phone]   Fax:  [phone]  Physical Therapy Treatment  Patient Details  Name: Amanda Humphrey MRN: [id number] Date of Birth: 01/14/1966 Referring Provider (PT): Marianne Paul, PA-C   Encounter Date: 11/22/2020  Treatment: 22 of 32.  Recert date: 12/27/2020 0739 to 1818   Past Medical History:  Diagnosis Date   Allergy    Chronic kidney disease    stones   Fibrocystic breast disease 2013   Meniere disease    Migraines     Past Surgical History:  Procedure Laterality Date   ABDOMINAL HYSTERECTOMY  2011   laprascopic supracervical, [place]   APPENDECTOMY  1999   BREAST BIOPSY Right Nov 2012    fibrocystic disease, [place]   CESAREAN SECTION     4 total   CHOLECYSTECTOMY  2011   TONSILLECTOMY AND ADENOIDECTOMY  1975    There were no vitals filed for this visit.    Pt. states she is noticing improvement in neck pain and muscle tightness in L shoulder blade. Pt. is able to take a deeper breath over past week.         Treatment   Manual Therapy 31 mins :    Supine STM to suboccipitals with intermittent manual cervical traction for decreased tension and related headaches.   Prone STM to L and R upper trap, levator scapulae, rhomboid, teres minor, and infraspinatus.   Trigger Point Dry Needling (TDN)  Education performed with patient regarding potential benefit of TDN. Reviewed precautions and risks with patient. Reviewed special precautions/risks over lung fields which include pneumothorax. Reviewed signs and symptoms of pneumothorax and advised pt to go to ER immediately if these symptoms develop advise them of dry needling treatment. Pt provided verbal consent to treatment. TDN performed to bilateral UT trigger points with 3, 0.25 x 60 L-type single needle placements on each side with 2 local twitch response (LTR). Pistoning technique utilized.  Improved pain-free motion following intervention.   Applied Biofreeze to upper back/ neck region in prone position.            PT Long Term Goals - 11/15/20 0739       PT LONG TERM GOAL #1   Title Pt. will improve FOTO score to >62 to improve independence with ADLs *foot*    Baseline 42 9/6: 57 10/20:65    Time 6    Period Weeks    Status Achieved    Target Date 11/15/20      PT LONG TERM GOAL #2   Title Pt will improve R ankle MMT to qual to L to faciliate increased stability for return to walks on the beach.    Baseline R/L: 3/5 Ankle dorsiflexion    5/25 bilat Sl calf raise Ankle plantarflexion  *pain with all MMT testing of the ankle/foot*  3/5 ankle inversion   3/5 ankle eversion 9/6: R/L: 4+/5 Ankle dorsiflexion    8/25 bilat Sl calf raise Ankle plantarflexion  *pain with all MMT testing of the ankle/foot*  4/5 ankle inversion   4/5 ankle eversion. 10/20: bilat Sl calf raise Ankle plantarflexion 8/25  *pain with all MMT testing of the ankle/foot*  4+/5 ankle inversion   4+/5 ankle eversion, 4+/5 ankle DF.    Time 6    Period Weeks    Status Partially Met    Target Date 11/15/20      PT LONG TERM GOAL #3     Title Pt will be able to complete 20 steps with R ankle pain >3/10 to facilitate greater mobility within the home.    Baseline 7-8/10 NPS 9/6: 2/10 at the lowest. 8 at the worse. Stair climbing is very difficult 10/20: Pt was able to complete >20 steps with 0/10.    Time 6    Period Weeks    Status Achieved    Target Date 11/15/20      PT LONG TERM GOAL #4   Title Pt will be able to complete work/home mobility tasks with laced ankle braced doff >50% for return to work specific foot wear.    Baseline Pt current needs to wear high cushion tennis shoes to work, brace donned 100% 9/6: laced brace doffed during ADLs the last 4 days. PT will wear brace during long work days.10/20: Pt is currently not using laced ankle braced and able to complete all work related tasks.    Time 6     Period Weeks    Status Achieved    Target Date 11/15/20      PT LONG TERM GOAL #5   Title Pt will improve FOTO score to predicted improvement value of 62 in regards to cervical impairments.    Baseline 52 10/20:47    Time 6    Period Weeks    Status Not Met    Target Date 12/27/20      PT LONG TERM GOAL #6   Title Pt will improve worse cervical pain by atleast 3 NPS points to allow increased tolerance during work and home related ADLs.    Baseline NPS: 8/10 current, 7/10 best, 10/10 Worse. 10/20: Worse pain 7/10    Time 6    Period Weeks    Status On-going    Target Date 12/27/20      PT LONG TERM GOAL #7   Title Pt will improve bilat UE MMT by atleast 1/2 grade to facilitate greater functional abilities with ADLs related to caregiving.    Baseline UE MMT R/L:   Shoulder Flexion: 4/4-  Shoulder abduction: 4/4-  Shoulder ER: 4/4  Shoulder IR: 4/4  Elbow ext: 5/5   Eblow flex: 5/5 10/20: UE MMT R/L:   Shoulder Flexion: 4/4  Shoulder abduction: 4/4 Shoulder ER: 4/4  Shoulder IR: 4/4  Elbow ext: 5/5   Eblow flex: 5/5    Time 6    Period Weeks    Status Partially Met    Target Date 12/27/20      PT LONG TERM GOAL #8   Title Pt will improve functional R hand strength equal to L to promote return of hand useage during grooming and feeding.    Baseline Grip R/L: 76.1 / 33.9    Lateral prehension grip R/L: 14 / 4 10/20: Grip R/L: 62.0 / 41.4    Lateral prehension grip R/L: 13 / 6    Time 6    Period Weeks    Status Partially Met    Target Date 12/27/20              Tx. focus on manual therapy/ TDN to decrease muscle tightness/ increase tissue extensibility in upper back/ scapular musculature. Several small trigger points noted in L UT musculature. No trigger points noted in L/R rhomboid musculature today. Pt. understands current UE/ postural HEP. Pt. instructed to stay active this weekend to decrease cervical/ L shoulder stiffness with repetitive movement.       Patient  will benefit from skilled therapeutic intervention   in order to improve the following deficits and impairments:  Decreased mobility, Decreased endurance, Hypomobility, Decreased range of motion, Decreased scar mobility, Improper body mechanics, Postural dysfunction, Impaired flexibility, Decreased strength, Decreased activity tolerance, Abnormal gait, Decreased balance, Difficulty walking, Increased edema, Impaired perceived functional ability  Visit Diagnosis: Joint stiffness of spine  Pain in right arm  Muscle weakness (generalized)     Problem List Patient Active Problem List   Diagnosis Date Noted   H/O of hemilaminectomy 02/15/2019   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   Fatigue 09/23/2017   History of pneumonia 04/30/2016   Grief reaction 04/30/2016   Vitamin D deficiency 02/22/2015   Hyperlipidemia 01/01/2015   Visit for preventive health examination 12/04/2013   Lower extremity edema 09/02/2013   Essential hypertension 09/02/2013   History of cardiomyopathy 09/02/2013   SVT (supraventricular tachycardia) (HCC) 09/02/2013   Palpitations 09/02/2013   Contrast media allergy 03/10/2013   Fibrocystic breast disease    Morbid obesity (HCC) 02/05/2012   Meniere disease    Michael C Sherk, PT, DPT # [id number] 11/26/2020, 11:26 AM  [place] [facility] [address] [place], [place], [address] Phone: [phone]   Fax:  [phone]  Name: Amanda Humphrey MRN: [id number] Date of Birth: 07/12/1965    

## 2020-11-27 ENCOUNTER — Other Ambulatory Visit: Payer: Self-pay

## 2020-11-27 ENCOUNTER — Encounter: Payer: Self-pay | Admitting: Physical Therapy

## 2020-11-27 ENCOUNTER — Ambulatory Visit: Payer: BC Managed Care – PPO | Attending: Podiatry | Admitting: Physical Therapy

## 2020-11-27 DIAGNOSIS — M25571 Pain in right ankle and joints of right foot: Secondary | ICD-10-CM | POA: Insufficient documentation

## 2020-11-27 DIAGNOSIS — M2560 Stiffness of unspecified joint, not elsewhere classified: Secondary | ICD-10-CM | POA: Diagnosis not present

## 2020-11-27 DIAGNOSIS — S93409A Sprain of unspecified ligament of unspecified ankle, initial encounter: Secondary | ICD-10-CM | POA: Diagnosis not present

## 2020-11-27 DIAGNOSIS — R29898 Other symptoms and signs involving the musculoskeletal system: Secondary | ICD-10-CM | POA: Insufficient documentation

## 2020-11-27 DIAGNOSIS — S96919A Strain of unspecified muscle and tendon at ankle and foot level, unspecified foot, initial encounter: Secondary | ICD-10-CM | POA: Diagnosis not present

## 2020-11-27 DIAGNOSIS — M79601 Pain in right arm: Secondary | ICD-10-CM | POA: Insufficient documentation

## 2020-11-27 DIAGNOSIS — M6281 Muscle weakness (generalized): Secondary | ICD-10-CM | POA: Insufficient documentation

## 2020-11-27 NOTE — Therapy (Signed)
[place] [facility] [address] [place], [place], [address] Phone: [phone]   Fax:  [phone]  Physical Therapy Treatment  Patient Details  Name: Amanda Humphrey MRN: [id number] Date of Birth: 1965-02-13 Referring Provider (PT): Orbie Pyo, [place]   Encounter Date: 11/27/2020   PT End of Session - 11/27/20 0936     Visit Number 23    Number of Visits 32    Date for PT Re-Evaluation 12/27/20    Authorization - Visit Number 3    Authorization - Number of Visits 10    PT Start Time 9038    PT Stop Time 0826    PT Time Calculation (min) 48 min    Activity Tolerance Patient tolerated treatment well;Patient limited by pain    Behavior During Therapy [facility] for tasks assessed/performed             Past Medical History:  Diagnosis Date   Allergy    Chronic kidney disease    stones   Fibrocystic breast disease 2013   Meniere disease    Migraines     Past Surgical History:  Procedure Laterality Date   ABDOMINAL HYSTERECTOMY  2011   laprascopic supracervical, [place]   APPENDECTOMY  1999   BREAST BIOPSY Right Nov 2012    fibrocystic disease, [place]   CESAREAN SECTION     4 total   CHOLECYSTECTOMY  2011   [place]    There were no vitals filed for this visit.   Subjective Assessment - 11/27/20 0920     Subjective Pt. entered PT with c/o generalized UT/cervical tightness/ discomfort.  No new complaints or symptoms.  Pt. has soft tissue massage scheduled for this Thursday and PT recommends effleurage technique.    Pertinent History MRI results: Rupture of R ant talofibular ligament. History of severe neck pain that causes radiation to the R UE. Cervical Srugery C6-T1(early 2021). History of bilat plantar fasciitis and heel spurs. Pt's spouse has health complication resulting with increased home ADL reponsibilies. Pt enjoys visiting and walking on the beach, which she is currently unable to do at  this time 2/2 ankle instability and pain.    Limitations Lifting;House hold activities    How long can you sit comfortably? WNL    How long can you stand comfortably? WNL with brace, fear of movement without brace.    How long can you walk comfortably? WNL with brace, fear of movement without brace.    Diagnostic tests MRI    Patient Stated Goals Decrease muscle spasms, return to work    Currently in Pain? Yes    Pain Score 5     Pain Location Neck    Pain Onset More than a month ago    Pain Onset More than a month ago               Ther.ex.:  completed in seated/supine position.  All movement completed with verbal/contact cueing to ensure optimal movement within a pain limited range. Each movement x 30.  1) 3# bilat shoulder flexion in seated   2) 3# chest press in supine 3) 3 # bicep curls in seated 4) 3# tricep extension in supine   Manual Therapy 31 mins :    Supine STM to suboccipitals with intermittent manual cervical traction for decreased tension and related headaches.    Prone STM to L and R upper trap, levator scapulae, rhomboid, teres minor, and infraspinatus.  Trigger Point Dry Needling (TDN)   Education performed with patient regarding potential benefit of TDN. Reviewed precautions and risks with patient. Reviewed special precautions/risks over lung fields which include pneumothorax. Reviewed signs and symptoms of pneumothorax and advised pt to go to ER immediately if these symptoms develop advise them of dry needling treatment. Pt provided verbal consent to treatment. TDN performed to bilateral UT trigger points with 4, 0.25 x 60 L-type single needle placements on each side with 2 local twitch response (LTR). Pistoning technique utilized. Improved pain-free motion following intervention.   Applied Biofreeze to upper back/ neck region in prone position.             PT Long Term Goals - 11/15/20 0739       PT LONG TERM GOAL #1   Title Pt. will improve  FOTO score to >62 to improve independence with ADLs *foot*    Baseline 42 9/6: 57 10/20:65    Time 6    Period Weeks    Status Achieved    Target Date 11/15/20      PT LONG TERM GOAL #2   Title Pt will improve R ankle MMT to qual to L to faciliate increased stability for return to walks on the beach.    Baseline R/L: 3/5 Ankle dorsiflexion    5/25 bilat Sl calf raise Ankle plantarflexion  *pain with all MMT testing of the ankle/foot*  3/5 ankle inversion   3/5 ankle eversion 9/6: R/L: 4+/5 Ankle dorsiflexion    8/25 bilat Sl calf raise Ankle plantarflexion  *pain with all MMT testing of the ankle/foot*  4/5 ankle inversion   4/5 ankle eversion. 10/20: bilat Sl calf raise Ankle plantarflexion 8/25  *pain with all MMT testing of the ankle/foot*  4+/5 ankle inversion   4+/5 ankle eversion, 4+/5 ankle DF.    Time 6    Period Weeks    Status Partially Met    Target Date 11/15/20      PT LONG TERM GOAL #3   Title Pt will be able to complete 20 steps with R ankle pain >3/10 to facilitate greater mobility within the home.    Baseline 7-8/10 NPS 9/6: 2/10 at the lowest. 8 at the worse. Stair climbing is very difficult 10/20: Pt was able to complete >20 steps with 0/10.    Time 6    Period Weeks    Status Achieved    Target Date 11/15/20      PT LONG TERM GOAL #4   Title Pt will be able to complete work/home mobility tasks with laced ankle braced doff >50% for return to work specific foot wear.    Baseline Pt current needs to wear high cushion tennis shoes to work, brace donned 100% 9/6: laced brace doffed during ADLs the last 4 days. PT will wear brace during long work days.10/20: Pt is currently not using laced ankle braced and able to complete all work related tasks.    Time 6    Period Weeks    Status Achieved    Target Date 11/15/20      PT LONG TERM GOAL #5   Title Pt will improve FOTO score to predicted improvement value of 62 in regards to cervical impairments.    Baseline 52 10/20:47     Time 6    Period Weeks    Status Not Met    Target Date 12/27/20      PT LONG TERM GOAL #6   Title Pt  will improve worse cervical pain by atleast 3 NPS points to allow increased tolerance during work and home related ADLs.    Baseline NPS: 8/10 current, 7/10 best, 10/10 Worse. 10/20: Worse pain 7/10    Time 6    Period Weeks    Status On-going    Target Date 12/27/20      PT LONG TERM GOAL #7   Title Pt will improve bilat UE MMT by atleast 1/2 grade to facilitate greater functional abilities with ADLs related to caregiving.    Baseline UE MMT R/L:   Shoulder Flexion: 4/4-  Shoulder abduction: 4/4-  Shoulder ER: 4/4  Shoulder IR: 4/4  Elbow ext: 5/5   Eblow flex: 5/5 10/20: UE MMT R/L:   Shoulder Flexion: 4/4  Shoulder abduction: 4/4 Shoulder ER: 4/4  Shoulder IR: 4/4  Elbow ext: 5/5   Eblow flex: 5/5    Time 6    Period Weeks    Status Partially Met    Target Date 12/27/20      PT LONG TERM GOAL #8   Title Pt will improve functional R hand strength equal to L to promote return of hand useage during grooming and feeding.    Baseline Grip R/L: 76.1 / 33.9    Lateral prehension grip R/L: 14 / 4 10/20: Grip R/L: 62.0 / 41.4    Lateral prehension grip R/L: 13 / 6    Time 6    Period Weeks    Status Partially Met    Target Date 12/27/20                   Plan - 11/27/20 6010     Clinical Impression Statement Pt. has difficulty/ increase discomfort with seated B shoulder flexion/ abduction with 3# dumbbells.  Pt. demonstrates good B shoulder/ cervical AROM WFL.  Several trigger points noted in L/R UT musculature and continues to benefit from TDN.  Pt. scheduled for massage this Thursday and will return to PT next Tuesday morning.  Pt. will continue to benefit from skilled PT services to increase cervical AROM/ postural strengthening to improve pain-free mobility.    Personal Factors and Comorbidities Comorbidity 2;Profession;Past/Current Experience    Comorbidities  Hypertension, Obesity    Examination-Activity Limitations Squat;Locomotion Level    Examination-Participation Restrictions Occupation;Cleaning;Laundry    Stability/Clinical Decision Making Evolving/Moderate complexity    Clinical Decision Making Moderate    Rehab Potential Good    PT Frequency 2x / week    PT Duration 6 weeks    PT Treatment/Interventions ADLs/Self Care Home Management;Aquatic Therapy;Biofeedback;Cryotherapy;Electrical Stimulation;Moist Heat;Ultrasound;Functional mobility training;Therapeutic activities;Therapeutic exercise;Balance training;Neuromuscular re-education;Patient/family education;Manual techniques;Scar mobilization;Passive range of motion;Dry needling;Energy conservation;Taping;Gait training;Stair training;DME Instruction;Spinal Manipulations;Joint Manipulations    PT Next Visit Plan Discuss massage therapy appointment.  Reassess cervical/ scapular ROM    PT Home Exercise Plan [id number]    Consulted and Agree with Plan of Care Patient             Patient will benefit from skilled therapeutic intervention in order to improve the following deficits and impairments:  Decreased mobility, Decreased endurance, Hypomobility, Decreased range of motion, Decreased scar mobility, Improper body mechanics, Postural dysfunction, Impaired flexibility, Decreased strength, Decreased activity tolerance, Abnormal gait, Decreased balance, Difficulty walking, Increased edema, Impaired perceived functional ability  Visit Diagnosis: Joint stiffness of spine  Pain in right arm  Muscle weakness (generalized)     Problem List Patient Active Problem List   Diagnosis Date Noted   H/O of hemilaminectomy 02/15/2019  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[place]) 11/17/2018   Fatigue 09/23/2017   History of pneumonia 04/30/2016   Grief reaction 04/30/2016   Vitamin D deficiency 02/22/2015   Hyperlipidemia 01/01/2015   Visit for preventive health examination 12/04/2013   Lower  extremity edema 09/02/2013   Essential hypertension 09/02/2013   History of cardiomyopathy 09/02/2013   SVT (supraventricular tachycardia) ([place]) 09/02/2013   Palpitations 09/02/2013   Contrast media allergy 03/10/2013   Fibrocystic breast disease    Morbid obesity ([place]) 02/05/2012   Meniere disease    Pura Spice, PT, DPT # [phone] 11/27/2020, 11:19 AM  [place] [facility] [address] [place], [place], [address] Phone: [phone]   Fax:  [phone]  Name: Amanda Humphrey MRN: [id number] Date of Birth: [birth year]

## 2020-11-29 ENCOUNTER — Encounter: Payer: BC Managed Care – PPO | Admitting: Physical Therapy

## 2020-12-04 ENCOUNTER — Other Ambulatory Visit: Payer: Self-pay

## 2020-12-04 ENCOUNTER — Ambulatory Visit: Payer: BC Managed Care – PPO | Admitting: Physical Therapy

## 2020-12-04 ENCOUNTER — Encounter: Payer: Self-pay | Admitting: Physical Therapy

## 2020-12-04 DIAGNOSIS — M79601 Pain in right arm: Secondary | ICD-10-CM | POA: Diagnosis not present

## 2020-12-04 DIAGNOSIS — M25571 Pain in right ankle and joints of right foot: Secondary | ICD-10-CM | POA: Diagnosis not present

## 2020-12-04 DIAGNOSIS — R29898 Other symptoms and signs involving the musculoskeletal system: Secondary | ICD-10-CM

## 2020-12-04 DIAGNOSIS — M2560 Stiffness of unspecified joint, not elsewhere classified: Secondary | ICD-10-CM | POA: Diagnosis not present

## 2020-12-04 DIAGNOSIS — M6281 Muscle weakness (generalized): Secondary | ICD-10-CM

## 2020-12-04 DIAGNOSIS — S96919A Strain of unspecified muscle and tendon at ankle and foot level, unspecified foot, initial encounter: Secondary | ICD-10-CM | POA: Diagnosis not present

## 2020-12-04 DIAGNOSIS — S93409A Sprain of unspecified ligament of unspecified ankle, initial encounter: Secondary | ICD-10-CM | POA: Diagnosis not present

## 2020-12-04 NOTE — Therapy (Signed)
[facility] [address] [place], [place], [address] Phone: [phone]   Fax:  [phone]  Physical Therapy Treatment  Patient Details  Name: Amanda Humphrey MRN: [id number] Date of Birth: Nov 21, 1965 Referring Provider (PT): Orbie Pyo, [place]   Encounter Date: 12/04/2020   PT End of Session - 12/04/20 0749     Visit Number 24    Number of Visits 32    Date for PT Re-Evaluation 12/27/20    Authorization - Visit Number 4    Authorization - Number of Visits 10    PT Start Time 0932    PT Stop Time 0824    PT Time Calculation (min) 45 min    Activity Tolerance Patient tolerated treatment well;Patient limited by pain    Behavior During Therapy [facility] for tasks assessed/performed             Past Medical History:  Diagnosis Date   Allergy    Chronic kidney disease    stones   Fibrocystic breast disease 2013   Meniere disease    Migraines     Past Surgical History:  Procedure Laterality Date   ABDOMINAL HYSTERECTOMY  2011   laprascopic supracervical, [place]   APPENDECTOMY  1999   BREAST BIOPSY Right Nov 2012    fibrocystic disease, [place]   CESAREAN SECTION     4 total   CHOLECYSTECTOMY  2011   [place]    There were no vitals filed for this visit.   Subjective Assessment - 12/04/20 0744     Subjective Pt presents to tx with 5-6/10 neck pain. Pt states that she received a massage on Thursday with mild benefit. Pt states yesterday was a very bad day 2/2 stressful work situation.    Pertinent History MRI results: Rupture of R ant talofibular ligament. History of severe neck pain that causes radiation to the R UE. Cervical Srugery C6-T1(early 2021). History of bilat plantar fasciitis and heel spurs. Pt's spouse has health complication resulting with increased home ADL reponsibilies. Pt enjoys visiting and walking on the beach, which she is currently unable to do at this time 2/2 ankle  instability and pain.    Limitations Lifting;House hold activities    How long can you sit comfortably? WNL    How long can you stand comfortably? WNL with brace, fear of movement without brace.    How long can you walk comfortably? WNL with brace, fear of movement without brace.    Diagnostic tests MRI    Patient Stated Goals Decrease muscle spasms, return to work    Currently in Pain? Yes    Pain Score 6     Pain Location Neck    Pain Descriptors / Indicators Aching    Pain Onset More than a month ago    Pain Onset More than a month ago                 Ther.ex.:  completed in seated/supine position.  All movement completed with verbal/contact cueing to ensure optimal movement within a pain limited range. Each movement x 30.  1) weighted dowel bilat shoulder flexion in supine  2) 4# chest press in supine 3) 4 # bicep curls in supine 4) GTB horizontal abduction   Discussed HEP   Manual Therapy 23 mins :    Seated STM to L and R upper trap, levator scapulae, rhomboid, teres minor, and infraspinatus.    Trigger Point Dry Needling (  TDN)   Education performed with patient regarding potential benefit of TDN. Reviewed precautions and risks with patient. Reviewed special precautions/risks over lung fields which include pneumothorax. Reviewed signs and symptoms of pneumothorax and advised pt to go to ER immediately if these symptoms develop advise them of dry needling treatment. Pt provided verbal consent to treatment. TDN performed to bilateral UT trigger points with 4, 0.25 x 60 L-type single needle placements on each side with 2 local twitch response (LTR). Pistoning technique utilized. Improved pain-free motion following intervention.   Applied Biofreeze to upper back/ neck region in prone position.              PT Long Term Goals - 11/15/20 0739       PT LONG TERM GOAL #1   Title Pt. will improve FOTO score to >62 to improve independence with ADLs *foot*     Baseline 42 9/6: 57 10/20:65    Time 6    Period Weeks    Status Achieved    Target Date 11/15/20      PT LONG TERM GOAL #2   Title Pt will improve R ankle MMT to qual to L to faciliate increased stability for return to walks on the beach.    Baseline R/L: 3/5 Ankle dorsiflexion    5/25 bilat Sl calf raise Ankle plantarflexion  *pain with all MMT testing of the ankle/foot*  3/5 ankle inversion   3/5 ankle eversion 9/6: R/L: 4+/5 Ankle dorsiflexion    8/25 bilat Sl calf raise Ankle plantarflexion  *pain with all MMT testing of the ankle/foot*  4/5 ankle inversion   4/5 ankle eversion. 10/20: bilat Sl calf raise Ankle plantarflexion 8/25  *pain with all MMT testing of the ankle/foot*  4+/5 ankle inversion   4+/5 ankle eversion, 4+/5 ankle DF.    Time 6    Period Weeks    Status Partially Met    Target Date 11/15/20      PT LONG TERM GOAL #3   Title Pt will be able to complete 20 steps with R ankle pain >3/10 to facilitate greater mobility within the home.    Baseline 7-8/10 NPS 9/6: 2/10 at the lowest. 8 at the worse. Stair climbing is very difficult 10/20: Pt was able to complete >20 steps with 0/10.    Time 6    Period Weeks    Status Achieved    Target Date 11/15/20      PT LONG TERM GOAL #4   Title Pt will be able to complete work/home mobility tasks with laced ankle braced doff >50% for return to work specific foot wear.    Baseline Pt current needs to wear high cushion tennis shoes to work, brace donned 100% 9/6: laced brace doffed during ADLs the last 4 days. PT will wear brace during long work days.10/20: Pt is currently not using laced ankle braced and able to complete all work related tasks.    Time 6    Period Weeks    Status Achieved    Target Date 11/15/20      PT LONG TERM GOAL #5   Title Pt will improve FOTO score to predicted improvement value of 62 in regards to cervical impairments.    Baseline 52 10/20:47    Time 6    Period Weeks    Status Not Met    Target Date  12/27/20      PT LONG TERM GOAL #6   Title Pt will improve worse  cervical pain by atleast 3 NPS points to allow increased tolerance during work and home related ADLs.    Baseline NPS: 8/10 current, 7/10 best, 10/10 Worse. 10/20: Worse pain 7/10    Time 6    Period Weeks    Status On-going    Target Date 12/27/20      PT LONG TERM GOAL #7   Title Pt will improve bilat UE MMT by atleast 1/2 grade to facilitate greater functional abilities with ADLs related to caregiving.    Baseline UE MMT R/L:   Shoulder Flexion: 4/4-  Shoulder abduction: 4/4-  Shoulder ER: 4/4  Shoulder IR: 4/4  Elbow ext: 5/5   Eblow flex: 5/5 10/20: UE MMT R/L:   Shoulder Flexion: 4/4  Shoulder abduction: 4/4 Shoulder ER: 4/4  Shoulder IR: 4/4  Elbow ext: 5/5   Eblow flex: 5/5    Time 6    Period Weeks    Status Partially Met    Target Date 12/27/20      PT LONG TERM GOAL #8   Title Pt will improve functional R hand strength equal to L to promote return of hand useage during grooming and feeding.    Baseline Grip R/L: 76.1 / 33.9    Lateral prehension grip R/L: 14 / 4 10/20: Grip R/L: 62.0 / 41.4    Lateral prehension grip R/L: 13 / 6    Time 6    Period Weeks    Status Partially Met    Target Date 12/27/20                   Plan - 12/04/20 0753     Clinical Impression Statement Pt tolerated tx well with progression of UE loading in supine with towel roll for optimal cervical positioning. Pt states decrease in pain and increased mobility s/p manual intervention. Pt continues to display mark cervical and UE endurance, strength, and ROM deficits resulting in decreased safe home/community mobiity, increased pain, and limited access to QOL. Pt. will continue to benefit from skilled physical therapy to progress POC to address remaining deficits to facilitate maximum functional capacity for optimal personal health and wellness for ADLs.    Personal Factors and Comorbidities Comorbidity 2;Profession;Past/Current  Experience    Comorbidities Hypertension, Obesity    Examination-Activity Limitations Squat;Locomotion Level    Examination-Participation Restrictions Occupation;Cleaning;Laundry    Stability/Clinical Decision Making Evolving/Moderate complexity    Clinical Decision Making Moderate    Rehab Potential Good    PT Frequency 2x / week    PT Duration 6 weeks    PT Treatment/Interventions ADLs/Self Care Home Management;Aquatic Therapy;Biofeedback;Cryotherapy;Electrical Stimulation;Moist Heat;Ultrasound;Functional mobility training;Therapeutic activities;Therapeutic exercise;Balance training;Neuromuscular re-education;Patient/family education;Manual techniques;Scar mobilization;Passive range of motion;Dry needling;Energy conservation;Taping;Gait training;Stair training;DME Instruction;Spinal Manipulations;Joint Manipulations    PT Next Visit Plan Discuss massage therapy appointment.  Reassess cervical/ scapular ROM    PT Home Exercise Plan [email or web address]    Consulted and Agree with Plan of Care Patient             Patient will benefit from skilled therapeutic intervention in order to improve the following deficits and impairments:  Decreased mobility, Decreased endurance, Hypomobility, Decreased range of motion, Decreased scar mobility, Improper body mechanics, Postural dysfunction, Impaired flexibility, Decreased strength, Decreased activity tolerance, Abnormal gait, Decreased balance, Difficulty walking, Increased edema, Impaired perceived functional ability  Visit Diagnosis: Joint stiffness of spine  Ankle weakness  Pain in right arm  Muscle weakness (generalized)     Problem List Patient Active Problem List  Diagnosis Date Noted   H/O of hemilaminectomy 02/15/2019   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[place]) 11/17/2018   Fatigue 09/23/2017   History of pneumonia 04/30/2016   Grief reaction 04/30/2016   Vitamin D deficiency 02/22/2015   Hyperlipidemia 01/01/2015   Visit for  preventive health examination 12/04/2013   Lower extremity edema 09/02/2013   Essential hypertension 09/02/2013   History of cardiomyopathy 09/02/2013   SVT (supraventricular tachycardia) ([place]) 09/02/2013   Palpitations 09/02/2013   Contrast media allergy 03/10/2013   Fibrocystic breast disease    Morbid obesity ([place]) 02/05/2012   Meniere disease    Pura Spice, PT, DPT # [id number] Fara Olden, SPT 12/04/2020, 8:32 AM  [facility] [address] [place], [place], [address] Phone: [phone]   Fax:  [phone]  Name: Amanda Humphrey MRN: [id number] Date of Birth: [birth year]

## 2020-12-06 ENCOUNTER — Encounter: Payer: Self-pay | Admitting: Physical Therapy

## 2020-12-06 ENCOUNTER — Other Ambulatory Visit: Payer: Self-pay

## 2020-12-06 ENCOUNTER — Ambulatory Visit: Payer: BC Managed Care – PPO | Admitting: Physical Therapy

## 2020-12-06 DIAGNOSIS — R29898 Other symptoms and signs involving the musculoskeletal system: Secondary | ICD-10-CM

## 2020-12-06 DIAGNOSIS — M25571 Pain in right ankle and joints of right foot: Secondary | ICD-10-CM | POA: Diagnosis not present

## 2020-12-06 DIAGNOSIS — M79601 Pain in right arm: Secondary | ICD-10-CM | POA: Diagnosis not present

## 2020-12-06 DIAGNOSIS — S96919A Strain of unspecified muscle and tendon at ankle and foot level, unspecified foot, initial encounter: Secondary | ICD-10-CM | POA: Diagnosis not present

## 2020-12-06 DIAGNOSIS — M6281 Muscle weakness (generalized): Secondary | ICD-10-CM | POA: Diagnosis not present

## 2020-12-06 DIAGNOSIS — S93409A Sprain of unspecified ligament of unspecified ankle, initial encounter: Secondary | ICD-10-CM | POA: Diagnosis not present

## 2020-12-06 DIAGNOSIS — M2560 Stiffness of unspecified joint, not elsewhere classified: Secondary | ICD-10-CM

## 2020-12-06 NOTE — Therapy (Signed)
[facility] [address] [place], [place], [address] Phone: [phone]   Fax:  [phone]  Physical Therapy Treatment  Patient Details  Name: Amanda Humphrey MRN: [id number] Date of Birth: 03/17/1965 Referring Provider (PT): Orbie Pyo, [place]   Encounter Date: 12/06/2020  Treatment: 25 of 32.  Recert date: 17/06/1605 0738 to 3710   Past Medical History:  Diagnosis Date   Allergy    Chronic kidney disease    stones   Fibrocystic breast disease 2013   Meniere disease    Migraines     Past Surgical History:  Procedure Laterality Date   ABDOMINAL HYSTERECTOMY  2011   laprascopic supracervical, [place]   APPENDECTOMY  1999   BREAST BIOPSY Right Nov 2012    fibrocystic disease, [place]   CESAREAN SECTION     4 total   CHOLECYSTECTOMY  2011   [place]    There were no vitals filed for this visit.     Pt. entered PT with no new complaints. Pt. continues to remain busy at work and home. Pt. scheduled for work conference next Wednesday to Sunday in [place].         Ther.ex.:  completed in standing position.  All movement completed with verbal/contact cueing to ensure optimal movement within a pain limited range. Each movement x 30.   1) 4# bicep curls 2) GTB scap. retraction 3) GTB sh. Extension/ tricep extension 4) GTB horizontal abduction   Discussed HEP     Manual Therapy 20 mins :    Seated and prone STM to L and R upper trap, levator scapulae, rhomboid, teres minor, and infraspinatus.    Trigger Point Dry Needling (TDN)   Education performed with patient regarding potential benefit of TDN. Reviewed precautions and risks with patient. Reviewed special precautions/risks over lung fields which include pneumothorax. Reviewed signs and symptoms of pneumothorax and advised pt to go to ER immediately if these symptoms develop advise them of dry needling treatment. Pt provided  verbal consent to treatment. TDN performed to bilateral UT trigger points with 4, 0.25 x 60 L-type single needle placements on each side with 1 local twitch response (LTR). Pistoning technique utilized. Improved pain-free motion following intervention.   Applied Biofreeze to upper back/ neck region in prone position.          PT Long Term Goals - 11/15/20 0739       PT LONG TERM GOAL #1   Title Pt. will improve FOTO score to >62 to improve independence with ADLs *foot*    Baseline 42 9/6: 57 10/20:65    Time 6    Period Weeks    Status Achieved    Target Date 11/15/20      PT LONG TERM GOAL #2   Title Pt will improve R ankle MMT to qual to L to faciliate increased stability for return to walks on the beach.    Baseline R/L: 3/5 Ankle dorsiflexion    5/25 bilat Sl calf raise Ankle plantarflexion  *pain with all MMT testing of the ankle/foot*  3/5 ankle inversion   3/5 ankle eversion 9/6: R/L: 4+/5 Ankle dorsiflexion    8/25 bilat Sl calf raise Ankle plantarflexion  *pain with all MMT testing of the ankle/foot*  4/5 ankle inversion   4/5 ankle eversion. 10/20: bilat Sl calf raise Ankle plantarflexion 8/25  *pain with all MMT testing of the ankle/foot*  4+/5 ankle inversion   4+/5 ankle  eversion, 4+/5 ankle DF.    Time 6    Period Weeks    Status Partially Met    Target Date 11/15/20      PT LONG TERM GOAL #3   Title Pt will be able to complete 20 steps with R ankle pain >3/10 to facilitate greater mobility within the home.    Baseline 7-8/10 NPS 9/6: 2/10 at the lowest. 8 at the worse. Stair climbing is very difficult 10/20: Pt was able to complete >20 steps with 0/10.    Time 6    Period Weeks    Status Achieved    Target Date 11/15/20      PT LONG TERM GOAL #4   Title Pt will be able to complete work/home mobility tasks with laced ankle braced doff >50% for return to work specific foot wear.    Baseline Pt current needs to wear high cushion tennis shoes to work, brace donned 100%  9/6: laced brace doffed during ADLs the last 4 days. PT will wear brace during long work days.10/20: Pt is currently not using laced ankle braced and able to complete all work related tasks.    Time 6    Period Weeks    Status Achieved    Target Date 11/15/20      PT LONG TERM GOAL #5   Title Pt will improve FOTO score to predicted improvement value of 62 in regards to cervical impairments.    Baseline 52 10/20:47    Time 6    Period Weeks    Status Not Met    Target Date 12/27/20      PT LONG TERM GOAL #6   Title Pt will improve worse cervical pain by atleast 3 NPS points to allow increased tolerance during work and home related ADLs.    Baseline NPS: 8/10 current, 7/10 best, 10/10 Worse. 10/20: Worse pain 7/10    Time 6    Period Weeks    Status On-going    Target Date 12/27/20      PT LONG TERM GOAL #7   Title Pt will improve bilat UE MMT by atleast 1/2 grade to facilitate greater functional abilities with ADLs related to caregiving.    Baseline UE MMT R/L:   Shoulder Flexion: 4/4-  Shoulder abduction: 4/4-  Shoulder ER: 4/4  Shoulder IR: 4/4  Elbow ext: 5/5   Eblow flex: 5/5 10/20: UE MMT R/L:   Shoulder Flexion: 4/4  Shoulder abduction: 4/4 Shoulder ER: 4/4  Shoulder IR: 4/4  Elbow ext: 5/5   Eblow flex: 5/5    Time 6    Period Weeks    Status Partially Met    Target Date 12/27/20      PT LONG TERM GOAL #8   Title Pt will improve functional R hand strength equal to L to promote return of hand useage during grooming and feeding.    Baseline Grip R/L: 76.1 / 33.9    Lateral prehension grip R/L: 14 / 4 10/20: Grip R/L: 62.0 / 41.4    Lateral prehension grip R/L: 13 / 6    Time 6    Period Weeks    Status Partially Met    Target Date 12/27/20              Pt. presents with less UT/cervical muscle tightness in supine/ prone position. Good tx. tolerance with TDN in prone position with single muscle fasiculation noted in R UT trigger point. No change with current HEP  and  pt. instructed to focus on upright posture/ proper body mechanics. Pt. will continue to benefit from skilled PT services to progress POC to address remaining deficits to facilitate maximum functional capacity for optimal personal health and wellness for ADLs.      Patient will benefit from skilled therapeutic intervention in order to improve the following deficits and impairments:  Decreased mobility, Decreased endurance, Hypomobility, Decreased range of motion, Decreased scar mobility, Improper body mechanics, Postural dysfunction, Impaired flexibility, Decreased strength, Decreased activity tolerance, Abnormal gait, Decreased balance, Difficulty walking, Increased edema, Impaired perceived functional ability  Visit Diagnosis: Joint stiffness of spine  Ankle weakness  Pain in right arm  Muscle weakness (generalized)     Problem List Patient Active Problem List   Diagnosis Date Noted   H/O of hemilaminectomy 02/15/2019   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[place]) 11/17/2018   Fatigue 09/23/2017   History of pneumonia 04/30/2016   Grief reaction 04/30/2016   Vitamin D deficiency 02/22/2015   Hyperlipidemia 01/01/2015   Visit for preventive health examination 12/04/2013   Lower extremity edema 09/02/2013   Essential hypertension 09/02/2013   History of cardiomyopathy 09/02/2013   SVT (supraventricular tachycardia) ([place]) 09/02/2013   Palpitations 09/02/2013   Contrast media allergy 03/10/2013   Fibrocystic breast disease    Morbid obesity ([place]) 02/05/2012   Meniere disease    Pura Spice, PT, DPT # [id number] Fara Olden, SPT 12/11/2020, 7:48 AM  [facility] [address] [place], [place], [address] Phone: [phone]   Fax:  [phone]  Name: Amanda Humphrey MRN: [id number] Date of Birth: [birth year]

## 2020-12-11 ENCOUNTER — Ambulatory Visit: Payer: BC Managed Care – PPO | Admitting: Physical Therapy

## 2020-12-11 ENCOUNTER — Encounter: Payer: Self-pay | Admitting: Physical Therapy

## 2020-12-11 DIAGNOSIS — M2560 Stiffness of unspecified joint, not elsewhere classified: Secondary | ICD-10-CM

## 2020-12-11 DIAGNOSIS — M6281 Muscle weakness (generalized): Secondary | ICD-10-CM | POA: Diagnosis not present

## 2020-12-11 DIAGNOSIS — M79601 Pain in right arm: Secondary | ICD-10-CM

## 2020-12-11 DIAGNOSIS — R29898 Other symptoms and signs involving the musculoskeletal system: Secondary | ICD-10-CM | POA: Diagnosis not present

## 2020-12-11 DIAGNOSIS — S96919A Strain of unspecified muscle and tendon at ankle and foot level, unspecified foot, initial encounter: Secondary | ICD-10-CM | POA: Diagnosis not present

## 2020-12-11 DIAGNOSIS — S93409A Sprain of unspecified ligament of unspecified ankle, initial encounter: Secondary | ICD-10-CM

## 2020-12-11 DIAGNOSIS — M25571 Pain in right ankle and joints of right foot: Secondary | ICD-10-CM | POA: Diagnosis not present

## 2020-12-11 NOTE — Patient Instructions (Signed)
Access Code: [email or web address] URL: [email or web address] Date: 12/11/2020 Prepared by: Dorene Grebe Exercises " Scapular Retraction with Resistance - 1 x daily - 5 x weekly - 20 reps " Shoulder External Rotation and Scapular Retraction with Resistance - 1 x daily - 5 x weekly - 20 reps " Seated Cervical Sidebending Stretch - 1 x daily - 5 x weekly - 20 reps " Seated Cervical Retraction - 1 x daily - 5 x weekly - 20 reps

## 2020-12-14 NOTE — Therapy (Signed)
[facility] [address] [place], [place], [address] Phone: [phone]   Fax:  [phone]  Physical Therapy Treatment  Patient Details  Name: Amanda Humphrey MRN: [id number] Date of Birth: 1965-12-31 Referring Provider (PT): Orbie Pyo, [place]   Encounter Date: 12/11/2020   Treatment: 26 of 32.  Recert date: 86/07/6193 0733 to 0820   Past Medical History:  Diagnosis Date   Allergy    Chronic kidney disease    stones   Fibrocystic breast disease 2013   Meniere disease    Migraines     Past Surgical History:  Procedure Laterality Date   ABDOMINAL HYSTERECTOMY  2011   laprascopic supracervical, [place]   APPENDECTOMY  1999   BREAST BIOPSY Right Nov 2012    fibrocystic disease, [place]   CESAREAN SECTION     4 total   CHOLECYSTECTOMY  2011   [place]    There were no vitals filed for this visit.     Pt. states she has increase work stress resulting in persistent 5/10 neck pain. Pt. heading to [facility] for a conference this week.         Ther.ex.:  completed in standing position.  All movement completed with verbal/contact cueing to ensure optimal movement within a pain limited range. Each movement x 30.   See HEP [facility])    Manual Therapy 25 mins :    Seated and prone STM to L and R upper trap, levator scapulae, rhomboid, teres minor, and infraspinatus.    Trigger Point Dry Needling (TDN)   Education performed with patient regarding potential benefit of TDN. Reviewed precautions and risks with patient. Reviewed special precautions/risks over lung fields which include pneumothorax. Reviewed signs and symptoms of pneumothorax and advised pt to go to ER immediately if these symptoms develop advise them of dry needling treatment. Pt provided verbal consent to treatment. TDN performed to bilateral UT trigger points with 5, 0.25 x 60 L-type single needle placements on each side with 2  local twitch response (LTR). Pistoning technique utilized. Improved pain-free motion following intervention.   Applied Biofreeze to upper back/ neck region in prone position.        PT Long Term Goals - 11/15/20 0739       PT LONG TERM GOAL #1   Title Pt. will improve FOTO score to >62 to improve independence with ADLs *foot*    Baseline 42 9/6: 57 10/20:65    Time 6    Period Weeks    Status Achieved    Target Date 11/15/20      PT LONG TERM GOAL #2   Title Pt will improve R ankle MMT to qual to L to faciliate increased stability for return to walks on the beach.    Baseline R/L: 3/5 Ankle dorsiflexion    5/25 bilat Sl calf raise Ankle plantarflexion  *pain with all MMT testing of the ankle/foot*  3/5 ankle inversion   3/5 ankle eversion 9/6: R/L: 4+/5 Ankle dorsiflexion    8/25 bilat Sl calf raise Ankle plantarflexion  *pain with all MMT testing of the ankle/foot*  4/5 ankle inversion   4/5 ankle eversion. 10/20: bilat Sl calf raise Ankle plantarflexion 8/25  *pain with all MMT testing of the ankle/foot*  4+/5 ankle inversion   4+/5 ankle eversion, 4+/5 ankle DF.    Time 6    Period Weeks    Status Partially Met    Target Date 11/15/20  PT LONG TERM GOAL #3   Title Pt will be able to complete 20 steps with R ankle pain >3/10 to facilitate greater mobility within the home.    Baseline 7-8/10 NPS 9/6: 2/10 at the lowest. 8 at the worse. Stair climbing is very difficult 10/20: Pt was able to complete >20 steps with 0/10.    Time 6    Period Weeks    Status Achieved    Target Date 11/15/20      PT LONG TERM GOAL #4   Title Pt will be able to complete work/home mobility tasks with laced ankle braced doff >50% for return to work specific foot wear.    Baseline Pt current needs to wear high cushion tennis shoes to work, brace donned 100% 9/6: laced brace doffed during ADLs the last 4 days. PT will wear brace during long work days.10/20: Pt is currently not using laced ankle braced  and able to complete all work related tasks.    Time 6    Period Weeks    Status Achieved    Target Date 11/15/20      PT LONG TERM GOAL #5   Title Pt will improve FOTO score to predicted improvement value of 62 in regards to cervical impairments.    Baseline 52 10/20:47    Time 6    Period Weeks    Status Not Met    Target Date 12/27/20      PT LONG TERM GOAL #6   Title Pt will improve worse cervical pain by atleast 3 NPS points to allow increased tolerance during work and home related ADLs.    Baseline NPS: 8/10 current, 7/10 best, 10/10 Worse. 10/20: Worse pain 7/10    Time 6    Period Weeks    Status On-going    Target Date 12/27/20      PT LONG TERM GOAL #7   Title Pt will improve bilat UE MMT by atleast 1/2 grade to facilitate greater functional abilities with ADLs related to caregiving.    Baseline UE MMT R/L:   Shoulder Flexion: 4/4-  Shoulder abduction: 4/4-  Shoulder ER: 4/4  Shoulder IR: 4/4  Elbow ext: 5/5   Eblow flex: 5/5 10/20: UE MMT R/L:   Shoulder Flexion: 4/4  Shoulder abduction: 4/4 Shoulder ER: 4/4  Shoulder IR: 4/4  Elbow ext: 5/5   Eblow flex: 5/5    Time 6    Period Weeks    Status Partially Met    Target Date 12/27/20      PT LONG TERM GOAL #8   Title Pt will improve functional R hand strength equal to L to promote return of hand useage during grooming and feeding.    Baseline Grip R/L: 76.1 / 33.9    Lateral prehension grip R/L: 14 / 4 10/20: Grip R/L: 62.0 / 41.4    Lateral prehension grip R/L: 13 / 6    Time 6    Period Weeks    Status Partially Met    Target Date 12/27/20              Pt. issued a new HEP handouts prioritizing the exercises while out of town. Pt. continues to present with less B UT muscle tightness and cervical AROM WFL. Pts. cervical/upper back discomfort is related to stress/ posture/ work life. Pt. will continue to benefit from UE/posture strengthening ex. and manual tx. to improve functional mobility.      Patient  will benefit from skilled  therapeutic intervention in order to improve the following deficits and impairments:  Decreased mobility, Decreased endurance, Hypomobility, Decreased range of motion, Decreased scar mobility, Improper body mechanics, Postural dysfunction, Impaired flexibility, Decreased strength, Decreased activity tolerance, Abnormal gait, Decreased balance, Difficulty walking, Increased edema, Impaired perceived functional ability  Visit Diagnosis: Joint stiffness of spine  Ankle weakness  Pain in right arm  Muscle weakness (generalized)  Pain in right ankle and joints of right foot  Sprain and strain of ankle     Problem List Patient Active Problem List   Diagnosis Date Noted   H/O of hemilaminectomy 02/15/2019   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[place]) 11/17/2018   Fatigue 09/23/2017   History of pneumonia 04/30/2016   Grief reaction 04/30/2016   Vitamin D deficiency 02/22/2015   Hyperlipidemia 01/01/2015   Visit for preventive health examination 12/04/2013   Lower extremity edema 09/02/2013   Essential hypertension 09/02/2013   History of cardiomyopathy 09/02/2013   SVT (supraventricular tachycardia) ([place]) 09/02/2013   Palpitations 09/02/2013   Contrast media allergy 03/10/2013   Fibrocystic breast disease    Morbid obesity ([place]) 02/05/2012   Meniere disease    Pura Spice, PT, DPT # [phone] 12/14/2020, 3:15 PM  [facility] [address] [place], [place], [address] Phone: [phone]   Fax:  [phone]  Name: Amanda Humphrey MRN: [id number] Date of Birth: [birth year]

## 2020-12-18 ENCOUNTER — Encounter: Payer: BC Managed Care – PPO | Admitting: Physical Therapy

## 2020-12-25 ENCOUNTER — Encounter: Payer: Self-pay | Admitting: Physical Therapy

## 2020-12-25 ENCOUNTER — Ambulatory Visit: Payer: BC Managed Care – PPO | Admitting: Physical Therapy

## 2020-12-25 ENCOUNTER — Other Ambulatory Visit: Payer: Self-pay

## 2020-12-25 DIAGNOSIS — M2560 Stiffness of unspecified joint, not elsewhere classified: Secondary | ICD-10-CM | POA: Diagnosis not present

## 2020-12-25 DIAGNOSIS — M79601 Pain in right arm: Secondary | ICD-10-CM

## 2020-12-25 DIAGNOSIS — R29898 Other symptoms and signs involving the musculoskeletal system: Secondary | ICD-10-CM

## 2020-12-25 DIAGNOSIS — M6281 Muscle weakness (generalized): Secondary | ICD-10-CM | POA: Diagnosis not present

## 2020-12-25 DIAGNOSIS — S93409A Sprain of unspecified ligament of unspecified ankle, initial encounter: Secondary | ICD-10-CM | POA: Diagnosis not present

## 2020-12-25 DIAGNOSIS — M25571 Pain in right ankle and joints of right foot: Secondary | ICD-10-CM | POA: Diagnosis not present

## 2020-12-25 DIAGNOSIS — S96919A Strain of unspecified muscle and tendon at ankle and foot level, unspecified foot, initial encounter: Secondary | ICD-10-CM | POA: Diagnosis not present

## 2020-12-25 NOTE — Therapy (Signed)
[facility] [address] [place], [place], [address] Phone: [phone]   Fax:  [phone]  Physical Therapy Treatment  Patient Details  Name: Amanda Humphrey MRN: [id number] Date of Birth: 1965-07-20 Referring Provider (PT): Orbie Pyo, [place]   Encounter Date: 12/25/2020   PT End of Session - 12/25/20 0826     Visit Number 27    Number of Visits 32    Date for PT Re-Evaluation 12/27/20    Authorization - Visit Number 7    Authorization - Number of Visits 10    PT Start Time 0730    PT Stop Time 0826    PT Time Calculation (min) 56 min    Activity Tolerance Patient tolerated treatment well;Patient limited by pain    Behavior During Therapy [facility] for tasks assessed/performed             Past Medical History:  Diagnosis Date   Allergy    Chronic kidney disease    stones   Fibrocystic breast disease 2013   Meniere disease    Migraines     Past Surgical History:  Procedure Laterality Date   ABDOMINAL HYSTERECTOMY  2011   laprascopic supracervical, [place]   APPENDECTOMY  1999   BREAST BIOPSY Right Nov 2012    fibrocystic disease, [place]   CESAREAN SECTION     4 total   CHOLECYSTECTOMY  2011   [place]    There were no vitals filed for this visit.   Subjective Assessment - 12/25/20 0824     Subjective Pt. reports continued work stress with 6/10 neck pain.  Pt. understands that stress/ tension in neck and upper back increase pain symptoms and L UE/hand tingling.    Pertinent History MRI results: Rupture of R ant talofibular ligament. History of severe neck pain that causes radiation to the R UE. Cervical Srugery C6-T1(early 2021). History of bilat plantar fasciitis and heel spurs. Pt's spouse has health complication resulting with increased home ADL reponsibilies. Pt enjoys visiting and walking on the beach, which she is currently unable to do at this time 2/2 ankle instability and  pain.    Limitations Lifting;House hold activities    How long can you sit comfortably? WNL    How long can you stand comfortably? WNL with brace, fear of movement without brace.    How long can you walk comfortably? WNL with brace, fear of movement without brace.    Diagnostic tests MRI    Patient Stated Goals Decrease muscle spasms, return to work    Currently in Pain? Yes    Pain Score 6     Pain Location Neck    Pain Orientation Left;Right    Pain Onset More than a month ago    Pain Onset More than a month ago               Ther.ex.:    Standing B shoulder flexion/ abduction 10x each with mirror feedback.  Supine 2# shoulder flexion (alternating)/ horizontal abduction/ serratus punches 20x.  Reassessment of cervical AROM in seated posture.      Manual Therapy (23 mins) :    Seated and prone STM to L and R upper trap, levator scapulae, rhomboid, teres minor, and infraspinatus.  Use of hypervolt to mid-thoracic/ cervical paraspinals.     Trigger Point Dry Needling (TDN)   Education performed with patient regarding potential benefit of TDN. Reviewed precautions and risks with patient. Reviewed  special precautions/risks over lung fields which include pneumothorax. Reviewed signs and symptoms of pneumothorax and advised pt to go to ER immediately if these symptoms develop advise them of dry needling treatment. Pt provided verbal consent to treatment. TDN performed to bilateral UT trigger points with 4, 0.25 x 60 L-type single needle placements on each side with 1 local twitch response (LTR). Pistoning technique utilized. Improved pain-free motion following intervention.  Increase discomfort reported with L UT trigger point as compared to R UT.  Applied Biofreeze to upper back/ neck region in prone position.          PT Long Term Goals - 11/15/20 0739       PT LONG TERM GOAL #1   Title Pt. will improve FOTO score to >62 to improve independence with ADLs *foot*     Baseline 42 9/6: 57 10/20:65    Time 6    Period Weeks    Status Achieved    Target Date 11/15/20      PT LONG TERM GOAL #2   Title Pt will improve R ankle MMT to qual to L to faciliate increased stability for return to walks on the beach.    Baseline R/L: 3/5 Ankle dorsiflexion    5/25 bilat Sl calf raise Ankle plantarflexion  *pain with all MMT testing of the ankle/foot*  3/5 ankle inversion   3/5 ankle eversion 9/6: R/L: 4+/5 Ankle dorsiflexion    8/25 bilat Sl calf raise Ankle plantarflexion  *pain with all MMT testing of the ankle/foot*  4/5 ankle inversion   4/5 ankle eversion. 10/20: bilat Sl calf raise Ankle plantarflexion 8/25  *pain with all MMT testing of the ankle/foot*  4+/5 ankle inversion   4+/5 ankle eversion, 4+/5 ankle DF.    Time 6    Period Weeks    Status Partially Met    Target Date 11/15/20      PT LONG TERM GOAL #3   Title Pt will be able to complete 20 steps with R ankle pain >3/10 to facilitate greater mobility within the home.    Baseline 7-8/10 NPS 9/6: 2/10 at the lowest. 8 at the worse. Stair climbing is very difficult 10/20: Pt was able to complete >20 steps with 0/10.    Time 6    Period Weeks    Status Achieved    Target Date 11/15/20      PT LONG TERM GOAL #4   Title Pt will be able to complete work/home mobility tasks with laced ankle braced doff >50% for return to work specific foot wear.    Baseline Pt current needs to wear high cushion tennis shoes to work, brace donned 100% 9/6: laced brace doffed during ADLs the last 4 days. PT will wear brace during long work days.10/20: Pt is currently not using laced ankle braced and able to complete all work related tasks.    Time 6    Period Weeks    Status Achieved    Target Date 11/15/20      PT LONG TERM GOAL #5   Title Pt will improve FOTO score to predicted improvement value of 62 in regards to cervical impairments.    Baseline 52 10/20:47    Time 6    Period Weeks    Status Not Met    Target Date  12/27/20      PT LONG TERM GOAL #6   Title Pt will improve worse cervical pain by atleast 3 NPS points to allow increased  tolerance during work and home related ADLs.    Baseline NPS: 8/10 current, 7/10 best, 10/10 Worse. 10/20: Worse pain 7/10    Time 6    Period Weeks    Status On-going    Target Date 12/27/20      PT LONG TERM GOAL #7   Title Pt will improve bilat UE MMT by atleast 1/2 grade to facilitate greater functional abilities with ADLs related to caregiving.    Baseline UE MMT R/L:   Shoulder Flexion: 4/4-  Shoulder abduction: 4/4-  Shoulder ER: 4/4  Shoulder IR: 4/4  Elbow ext: 5/5   Eblow flex: 5/5 10/20: UE MMT R/L:   Shoulder Flexion: 4/4  Shoulder abduction: 4/4 Shoulder ER: 4/4  Shoulder IR: 4/4  Elbow ext: 5/5   Eblow flex: 5/5    Time 6    Period Weeks    Status Partially Met    Target Date 12/27/20      PT LONG TERM GOAL #8   Title Pt will improve functional R hand strength equal to L to promote return of hand useage during grooming and feeding.    Baseline Grip R/L: 76.1 / 33.9    Lateral prehension grip R/L: 14 / 4 10/20: Grip R/L: 62.0 / 41.4    Lateral prehension grip R/L: 13 / 6    Time 6    Period Weeks    Status Partially Met    Target Date 12/27/20                   Plan - 12/25/20 0826     Clinical Impression Statement Moderate L UT tenderness with palpation in prone position prior to TDN.  Muscle tension in B cervical paraspinals noted during supine UE ther.ex. with light resistive wts.  Pt. able to complete full B shoulder ROM/ resisted ex. with proper technique.  Cervical AROM WFL (all planes) with increase discomfort at end-range.  No change to HEP and pt. returns to PT in 2 days prior to trip to [place] for work.    Personal Factors and Comorbidities Comorbidity 2;Profession;Past/Current Experience    Comorbidities Hypertension, Obesity    Examination-Activity Limitations Squat;Locomotion Level    Examination-Participation Restrictions  Occupation;Cleaning;Laundry    Stability/Clinical Decision Making Evolving/Moderate complexity    Clinical Decision Making Moderate    Rehab Potential Good    PT Frequency 2x / week    PT Duration 6 weeks    PT Treatment/Interventions ADLs/Self Care Home Management;Aquatic Therapy;Biofeedback;Cryotherapy;Electrical Stimulation;Moist Heat;Ultrasound;Functional mobility training;Therapeutic activities;Therapeutic exercise;Balance training;Neuromuscular re-education;Patient/family education;Manual techniques;Scar mobilization;Passive range of motion;Dry needling;Energy conservation;Taping;Gait training;Stair training;DME Instruction;Spinal Manipulations;Joint Manipulations    PT Next Visit Plan Reassess goals/ update    PT Home Exercise Plan [email or web address].  New HEP: [id number]    Consulted and Agree with Plan of Care Patient             Patient will benefit from skilled therapeutic intervention in order to improve the following deficits and impairments:  Decreased mobility, Decreased endurance, Hypomobility, Decreased range of motion, Decreased scar mobility, Improper body mechanics, Postural dysfunction, Impaired flexibility, Decreased strength, Decreased activity tolerance, Abnormal gait, Decreased balance, Difficulty walking, Increased edema, Impaired perceived functional ability  Visit Diagnosis: Joint stiffness of spine  Ankle weakness  Pain in right arm  Muscle weakness (generalized)     Problem List Patient Active Problem List   Diagnosis Date Noted   H/O of hemilaminectomy 02/15/2019   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[place]) 11/17/2018   Fatigue 09/23/2017  History of pneumonia 04/30/2016   Grief reaction 04/30/2016   Vitamin D deficiency 02/22/2015   Hyperlipidemia 01/01/2015   Visit for preventive health examination 12/04/2013   Lower extremity edema 09/02/2013   Essential hypertension 09/02/2013   History of cardiomyopathy 09/02/2013   SVT (supraventricular  tachycardia) ([place]) 09/02/2013   Palpitations 09/02/2013   Contrast media allergy 03/10/2013   Fibrocystic breast disease    Morbid obesity ([place]) 02/05/2012   Meniere disease    Pura Spice, PT, DPT # [phone] 12/25/2020, 12:38 PM  [facility] [address] [place], [place], [address] Phone: [phone]   Fax:  [phone]  Name: Amanda Humphrey MRN: [id number] Date of Birth: 08-05-1965

## 2020-12-27 ENCOUNTER — Ambulatory Visit: Payer: BC Managed Care – PPO | Admitting: Physical Therapy

## 2020-12-31 ENCOUNTER — Ambulatory Visit: Payer: BC Managed Care – PPO | Attending: Podiatry | Admitting: Physical Therapy

## 2020-12-31 ENCOUNTER — Other Ambulatory Visit: Payer: Self-pay

## 2020-12-31 DIAGNOSIS — M79601 Pain in right arm: Secondary | ICD-10-CM | POA: Insufficient documentation

## 2020-12-31 DIAGNOSIS — M6281 Muscle weakness (generalized): Secondary | ICD-10-CM | POA: Insufficient documentation

## 2020-12-31 DIAGNOSIS — S93409A Sprain of unspecified ligament of unspecified ankle, initial encounter: Secondary | ICD-10-CM | POA: Insufficient documentation

## 2020-12-31 DIAGNOSIS — M2560 Stiffness of unspecified joint, not elsewhere classified: Secondary | ICD-10-CM | POA: Insufficient documentation

## 2020-12-31 DIAGNOSIS — R29898 Other symptoms and signs involving the musculoskeletal system: Secondary | ICD-10-CM | POA: Diagnosis not present

## 2020-12-31 DIAGNOSIS — M25571 Pain in right ankle and joints of right foot: Secondary | ICD-10-CM | POA: Diagnosis not present

## 2020-12-31 DIAGNOSIS — S96919A Strain of unspecified muscle and tendon at ankle and foot level, unspecified foot, initial encounter: Secondary | ICD-10-CM | POA: Insufficient documentation

## 2021-01-03 ENCOUNTER — Encounter: Payer: Self-pay | Admitting: Physical Therapy

## 2021-01-03 ENCOUNTER — Other Ambulatory Visit: Payer: Self-pay

## 2021-01-03 ENCOUNTER — Ambulatory Visit: Payer: BC Managed Care – PPO | Admitting: Physical Therapy

## 2021-01-03 DIAGNOSIS — R29898 Other symptoms and signs involving the musculoskeletal system: Secondary | ICD-10-CM

## 2021-01-03 DIAGNOSIS — M79601 Pain in right arm: Secondary | ICD-10-CM | POA: Diagnosis not present

## 2021-01-03 DIAGNOSIS — M6281 Muscle weakness (generalized): Secondary | ICD-10-CM

## 2021-01-03 DIAGNOSIS — S96919A Strain of unspecified muscle and tendon at ankle and foot level, unspecified foot, initial encounter: Secondary | ICD-10-CM | POA: Diagnosis not present

## 2021-01-03 DIAGNOSIS — M2560 Stiffness of unspecified joint, not elsewhere classified: Secondary | ICD-10-CM

## 2021-01-03 DIAGNOSIS — M25571 Pain in right ankle and joints of right foot: Secondary | ICD-10-CM | POA: Diagnosis not present

## 2021-01-03 DIAGNOSIS — S93409A Sprain of unspecified ligament of unspecified ankle, initial encounter: Secondary | ICD-10-CM | POA: Diagnosis not present

## 2021-01-03 NOTE — Therapy (Signed)
[facility] [address] [place], [place], [address] Phone: [phone]   Fax:  [phone]  Physical Therapy Treatment  Patient Details  Name: Amanda Humphrey MRN: [id number] Date of Birth: 1965-12-10 Referring Provider (PT): Orbie Pyo, [place]   Encounter Date: 01/03/2021   PT End of Session - 01/03/21 0844     Visit Number 29    Number of Visits 35    Date for PT Re-Evaluation 01/28/21    Authorization - Visit Number 9    Authorization - Number of Visits 10    PT Start Time 5102    PT Stop Time 0820    PT Time Calculation (min) 49 min    Activity Tolerance Patient tolerated treatment well;Patient limited by pain    Behavior During Therapy [facility] for tasks assessed/performed             Past Medical History:  Diagnosis Date   Allergy    Chronic kidney disease    stones   Fibrocystic breast disease 2013   Meniere disease    Migraines     Past Surgical History:  Procedure Laterality Date   ABDOMINAL HYSTERECTOMY  2011   laprascopic supracervical, [place]   APPENDECTOMY  1999   BREAST BIOPSY Right Nov 2012    fibrocystic disease, [place]   CESAREAN SECTION     4 total   CHOLECYSTECTOMY  2011   [place]    There were no vitals filed for this visit.   Subjective Assessment - 01/03/21 0840     Subjective Pt. reports no new complaints. Pt. states she has less discomfort in B shoulders and pain is more localized to cervical spine.    Pertinent History MRI results: Rupture of R ant talofibular ligament. History of severe neck pain that causes radiation to the R UE. Cervical Srugery C6-T1(early 2021). History of bilat plantar fasciitis and heel spurs. Pt's spouse has health complication resulting with increased home ADL reponsibilies. Pt enjoys visiting and walking on the beach, which she is currently unable to do at this time 2/2 ankle instability and pain.    Limitations Lifting;House  hold activities    How long can you sit comfortably? WNL    How long can you stand comfortably? WNL with brace, fear of movement without brace.    How long can you walk comfortably? WNL with brace, fear of movement without brace.    Diagnostic tests MRI    Patient Stated Goals Decrease muscle spasms, return to work    Currently in Pain? Yes    Pain Score 5     Pain Location Neck    Pain Orientation Right;Left;Mid;Lower    Pain Descriptors / Indicators Aching;Constant    Pain Type Chronic pain    Pain Onset More than a month ago    Pain Onset More than a month ago              Ther.ex.:     Standing B shoulder flexion/ abduction/ ER 10x each with mirror feedback.   Seated 3# shoulder flexion/ bicep curls/ ER 20x.  Supine chest press/ tricep extension 20x.   Reassessment of cervical AROM in seated posture.      Manual Therapy (12 mins) :    Seated and prone STM to L and R upper trap, levator scapulae, rhomboid, teres minor, and infraspinatus.      Trigger Point Dry Needling (TDN)   Education performed with patient regarding  potential benefit of TDN. Reviewed precautions and risks with patient. Reviewed special precautions/risks over lung fields which include pneumothorax. Reviewed signs and symptoms of pneumothorax and advised pt to go to ER immediately if these symptoms develop advise them of dry needling treatment. Pt provided verbal consent to treatment. TDN performed to bilateral suboccipital region/ mid-cervical paraspinals with 3, 0.25 x 60 L-type single needle placements on each side with 1 local twitch response (LTR). Pistoning technique utilized. Improved pain-free motion following intervention.  Increase discomfort reported with L UT trigger point as compared to R UT.  Applied Biofreeze to upper back/ neck region in prone position.         PT Long Term Goals - 01/03/21 0829       PT LONG TERM GOAL #1   Title Pt. will improve FOTO score to >62 to improve  independence with ADLs *foot*    Baseline 42 9/6: 57 10/20:65    Time 6    Period Weeks    Status Achieved    Target Date 11/15/20      PT LONG TERM GOAL #2   Title Pt will improve R ankle MMT to qual to L to faciliate increased stability for return to walks on the beach.    Baseline R/L: 3/5 Ankle dorsiflexion    5/25 bilat Sl calf raise Ankle plantarflexion  *pain with all MMT testing of the ankle/foot*  3/5 ankle inversion   3/5 ankle eversion 9/6: R/L: 4+/5 Ankle dorsiflexion    8/25 bilat Sl calf raise Ankle plantarflexion  *pain with all MMT testing of the ankle/foot*  4/5 ankle inversion   4/5 ankle eversion. 10/20: bilat Sl calf raise Ankle plantarflexion 8/25  *pain with all MMT testing of the ankle/foot*  4+/5 ankle inversion   4+/5 ankle eversion, 4+/5 ankle DF.    Time 6    Period Weeks    Status Achieved    Target Date 12/31/20      PT LONG TERM GOAL #3   Title Pt will be able to complete 20 steps with R ankle pain >3/10 to facilitate greater mobility within the home.    Baseline 7-8/10 NPS 9/6: 2/10 at the lowest. 8 at the worse. Stair climbing is very difficult 10/20: Pt was able to complete >20 steps with 0/10.    Time 6    Period Weeks    Status Achieved    Target Date 11/15/20      PT LONG TERM GOAL #4   Title Pt will be able to complete work/home mobility tasks with laced ankle braced doff >50% for return to work specific foot wear.    Baseline Pt current needs to wear high cushion tennis shoes to work, brace donned 100% 9/6: laced brace doffed during ADLs the last 4 days. PT will wear brace during long work days.10/20: Pt is currently not using laced ankle braced and able to complete all work related tasks.    Time 6    Period Weeks    Status Achieved    Target Date 11/15/20      PT LONG TERM GOAL #5   Title Pt will improve FOTO score to predicted improvement value of 62 in regards to cervical impairments.    Baseline 52 10/20:47.  12/5: 63    Time 4    Period  Weeks    Status On-going    Target Date 01/28/21      PT LONG TERM GOAL #6   Title Pt will  improve worse cervical pain by atleast 3 NPS points to allow increased tolerance during work and home related ADLs.    Baseline NPS: 8/10 current, 7/10 best, 10/10 Worse. 10/20: Worse pain 7/10    Time 4    Period Weeks    Status Partially Met    Target Date 01/28/21      PT LONG TERM GOAL #7   Title Pt will improve bilat UE MMT by atleast 1/2 grade to facilitate greater functional abilities with ADLs related to caregiving.    Baseline UE MMT R/L:   Shoulder Flexion: 4/4-  Shoulder abduction: 4/4-  Shoulder ER: 4/4  Shoulder IR: 4/4  Elbow ext: 5/5   Eblow flex: 5/5 10/20: UE MMT R/L:   Shoulder Flexion: 4/4  Shoulder abduction: 4/4 Shoulder ER: 4/4  Shoulder IR: 4/4  Elbow ext: 5/5   Eblow flex: 5/5    Time 6    Period Weeks    Status Partially Met    Target Date 01/28/21      PT LONG TERM GOAL #8   Title Pt will improve functional R hand strength equal to L to promote return of hand useage during grooming and feeding.    Baseline Grip R/L: 76.1 / 33.9    Lateral prehension grip R/L: 14 / 4 10/20: Grip R/L: 62.0 / 41.4    Lateral prehension grip R/L: 13 / 6    Time 6    Period Weeks    Status Partially Met    Target Date 01/28/21                Plan - 01/03/21 0844     Clinical Impression Statement Pt. continues to present with R mid-low cervical "catching" during R lateral flexion/ rotn. movement patterns.  Pt. progressed to 3# UE ex. in seated/ supine position with marked fatigue/ discomfort in neck report.  Good STM to B mid-thoracic paraspinals/ posterior deltoid and 2 small trigger points noted.  Pt. tolerates TDN well in prone position with small muscle fasciculation noted in R suboccipital region.  No change to HEP.    Personal Factors and Comorbidities Comorbidity 2;Profession;Past/Current Experience    Comorbidities Hypertension, Obesity    Examination-Activity Limitations  Squat;Locomotion Level    Examination-Participation Restrictions Occupation;Cleaning;Laundry    Stability/Clinical Decision Making Evolving/Moderate complexity    Clinical Decision Making Moderate    Rehab Potential Good    PT Frequency 2x / week    PT Duration 4 weeks    PT Treatment/Interventions ADLs/Self Care Home Management;Aquatic Therapy;Biofeedback;Cryotherapy;Electrical Stimulation;Moist Heat;Ultrasound;Functional mobility training;Therapeutic activities;Therapeutic exercise;Balance training;Neuromuscular re-education;Patient/family education;Manual techniques;Scar mobilization;Passive range of motion;Dry needling;Energy conservation;Taping;Gait training;Stair training;DME Instruction;Spinal Manipulations;Joint Manipulations    PT Next Visit Plan Progress HEP/ functional strengthening    PT Home Exercise Plan [id number].  New HEP: [id number]    Consulted and Agree with Plan of Care Patient             Patient will benefit from skilled therapeutic intervention in order to improve the following deficits and impairments:  Decreased mobility, Decreased endurance, Hypomobility, Decreased range of motion, Decreased scar mobility, Improper body mechanics, Postural dysfunction, Impaired flexibility, Decreased strength, Decreased activity tolerance, Abnormal gait, Decreased balance, Difficulty walking, Increased edema, Impaired perceived functional ability  Visit Diagnosis: Joint stiffness of spine  Ankle weakness  Pain in right arm  Muscle weakness (generalized)     Problem List Patient Active Problem List   Diagnosis Date Noted   H/O of hemilaminectomy 02/15/2019   Cervical spondylitis  with radiculitis ([place]) 11/17/2018   Fatigue 09/23/2017   History of pneumonia 04/30/2016   Grief reaction 04/30/2016   Vitamin D deficiency 02/22/2015   Hyperlipidemia 01/01/2015   Visit for preventive health examination 12/04/2013   Lower extremity edema 09/02/2013   Essential hypertension  09/02/2013   History of cardiomyopathy 09/02/2013   SVT (supraventricular tachycardia) ([place]) 09/02/2013   Palpitations 09/02/2013   Contrast media allergy 03/10/2013   Fibrocystic breast disease    Morbid obesity ([place]) 02/05/2012   Meniere disease    Pura Spice, PT, DPT # [phone] 01/03/2021, 8:52 AM  [facility] [address] [place], [place], [address] Phone: [phone]   Fax:  [phone]  Name: ADALAY AZUCENA MRN: [id number] Date of Birth: 1965/02/01

## 2021-01-03 NOTE — Therapy (Signed)
[facility] [address] [place], [place], [address] Phone: [phone]   Fax:  [phone]  Physical Therapy Treatment  Patient Details  Name: Amanda Humphrey MRN: [id number] Date of Birth: [birth year] Referring Provider (PT): Orbie Pyo, [place]   Encounter Date: 12/31/2020   PT End of Session - 01/03/21 0812     Visit Number 28    Number of Visits 35    Date for PT Re-Evaluation 01/28/21    Authorization - Visit Number 8    Authorization - Number of Visits 10    PT Start Time 9379    PT Stop Time 0240    PT Time Calculation (min) 40 min    Activity Tolerance Patient tolerated treatment well;Patient limited by pain    Behavior During Therapy [facility] for tasks assessed/performed             Past Medical History:  Diagnosis Date   Allergy    Chronic kidney disease    stones   Fibrocystic breast disease 2013   Meniere disease    Migraines     Past Surgical History:  Procedure Laterality Date   ABDOMINAL HYSTERECTOMY  2011   laprascopic supracervical, [place]   APPENDECTOMY  1999   BREAST BIOPSY Right Nov 2012    fibrocystic disease, [place]   CESAREAN SECTION     4 total   CHOLECYSTECTOMY  2011   [place]    There were no vitals filed for this visit.   Subjective Assessment - 01/03/21 0728     Subjective Pt. had a nice trip to [place] for work.  Pt. states ankle/ foot bothered her with all the walking.    Pertinent History MRI results: Rupture of R ant talofibular ligament. History of severe neck pain that causes radiation to the R UE. Cervical Srugery C6-T1(early 2021). History of bilat plantar fasciitis and heel spurs. Pt's spouse has health complication resulting with increased home ADL reponsibilies. Pt enjoys visiting and walking on the beach, which she is currently unable to do at this time 2/2 ankle instability and pain.    Limitations Lifting;House hold activities    How long  can you sit comfortably? WNL    How long can you stand comfortably? WNL with brace, fear of movement without brace.    How long can you walk comfortably? WNL with brace, fear of movement without brace.    Diagnostic tests MRI    Patient Stated Goals Decrease muscle spasms, return to work    Currently in Pain? Yes    Pain Score 6     Pain Location Neck    Pain Orientation Right;Left    Pain Onset More than a month ago    Pain Onset More than a month ago                [facility] PT Assessment - 01/03/21 0001       Assessment   Medical Diagnosis L cervical radiculopathy/ myofascial pain    Referring Provider (PT) Orbie Pyo, PA-C      Prior Function   Level of Independence Independent              Ther.ex.:     Seated B shoulder flexion/ abduction/ ER 10x each with mirror feedback.   Supine 2# shoulder flexion (alternating)/ horizontal abduction/ serratus punches 20x.   Seated/ supine cervical AROM (all planes)- "catching" with R lateral flexion/ rotn.    Reviewed HEP  Manual Therapy (14 mins) :    Seated and prone STM to L and R upper trap, levator scapulae, rhomboid, teres minor, and infraspinatus.  Use of hypervolt to mid-thoracic/ cervical paraspinals.      No TDN today.      PT Long Term Goals - 01/03/21 0829       PT LONG TERM GOAL #1   Title Pt. will improve FOTO score to >62 to improve independence with ADLs *foot*    Baseline 42 9/6: 57 10/20:65    Time 6    Period Weeks    Status Achieved    Target Date 11/15/20      PT LONG TERM GOAL #2   Title Pt will improve R ankle MMT to qual to L to faciliate increased stability for return to walks on the beach.    Baseline R/L: 3/5 Ankle dorsiflexion    5/25 bilat Sl calf raise Ankle plantarflexion  *pain with all MMT testing of the ankle/foot*  3/5 ankle inversion   3/5 ankle eversion 9/6: R/L: 4+/5 Ankle dorsiflexion    8/25 bilat Sl calf raise Ankle plantarflexion  *pain with all MMT testing of  the ankle/foot*  4/5 ankle inversion   4/5 ankle eversion. 10/20: bilat Sl calf raise Ankle plantarflexion 8/25  *pain with all MMT testing of the ankle/foot*  4+/5 ankle inversion   4+/5 ankle eversion, 4+/5 ankle DF.    Time 6    Period Weeks    Status Achieved    Target Date 12/31/20      PT LONG TERM GOAL #3   Title Pt will be able to complete 20 steps with R ankle pain >3/10 to facilitate greater mobility within the home.    Baseline 7-8/10 NPS 9/6: 2/10 at the lowest. 8 at the worse. Stair climbing is very difficult 10/20: Pt was able to complete >20 steps with 0/10.    Time 6    Period Weeks    Status Achieved    Target Date 11/15/20      PT LONG TERM GOAL #4   Title Pt will be able to complete work/home mobility tasks with laced ankle braced doff >50% for return to work specific foot wear.    Baseline Pt current needs to wear high cushion tennis shoes to work, brace donned 100% 9/6: laced brace doffed during ADLs the last 4 days. PT will wear brace during long work days.10/20: Pt is currently not using laced ankle braced and able to complete all work related tasks.    Time 6    Period Weeks    Status Achieved    Target Date 11/15/20      PT LONG TERM GOAL #5   Title Pt will improve FOTO score to predicted improvement value of 62 in regards to cervical impairments.    Baseline 52 10/20:47.  12/5: 63    Time 4    Period Weeks    Status On-going    Target Date 01/28/21      PT LONG TERM GOAL #6   Title Pt will improve worse cervical pain by atleast 3 NPS points to allow increased tolerance during work and home related ADLs.    Baseline NPS: 8/10 current, 7/10 best, 10/10 Worse. 10/20: Worse pain 7/10    Time 4    Period Weeks    Status Partially Met    Target Date 01/28/21      PT LONG TERM GOAL #7   Title Pt will  improve bilat UE MMT by atleast 1/2 grade to facilitate greater functional abilities with ADLs related to caregiving.    Baseline UE MMT R/L:   Shoulder  Flexion: 4/4-  Shoulder abduction: 4/4-  Shoulder ER: 4/4  Shoulder IR: 4/4  Elbow ext: 5/5   Eblow flex: 5/5 10/20: UE MMT R/L:   Shoulder Flexion: 4/4  Shoulder abduction: 4/4 Shoulder ER: 4/4  Shoulder IR: 4/4  Elbow ext: 5/5   Eblow flex: 5/5    Time 6    Period Weeks    Status Partially Met    Target Date 01/28/21      PT LONG TERM GOAL #8   Title Pt will improve functional R hand strength equal to L to promote return of hand useage during grooming and feeding.    Baseline Grip R/L: 76.1 / 33.9    Lateral prehension grip R/L: 14 / 4 10/20: Grip R/L: 62.0 / 41.4    Lateral prehension grip R/L: 13 / 6    Time 6    Period Weeks    Status Partially Met    Target Date 01/28/21                   Plan - 01/03/21 0813     Clinical Impression Statement R cervical "catching" with R lateral flexion/ rotn. in supine position.  Good cervical AROM in seated posture with good shoulder flexion/ scapular movement.  B UT tenderness with palpation but limited trigger points noted today (no TDN today).  See updated goals.  Pt. will continue to benefit from focus on posture/ UE/ scapular strengthening to improve pain-free mobility with daily/ work-related tasks.    Personal Factors and Comorbidities Comorbidity 2;Profession;Past/Current Experience    Comorbidities Hypertension, Obesity    Examination-Activity Limitations Squat;Locomotion Level    Examination-Participation Restrictions Occupation;Cleaning;Laundry    Stability/Clinical Decision Making Evolving/Moderate complexity    Clinical Decision Making Moderate    Rehab Potential Good    PT Frequency 2x / week    PT Duration 4 weeks    PT Treatment/Interventions ADLs/Self Care Home Management;Aquatic Therapy;Biofeedback;Cryotherapy;Electrical Stimulation;Moist Heat;Ultrasound;Functional mobility training;Therapeutic activities;Therapeutic exercise;Balance training;Neuromuscular re-education;Patient/family education;Manual techniques;Scar  mobilization;Passive range of motion;Dry needling;Energy conservation;Taping;Gait training;Stair training;DME Instruction;Spinal Manipulations;Joint Manipulations    PT Next Visit Plan Reassess need for TDN    PT Home Exercise Plan [id number].  New HEP: [id number]    Consulted and Agree with Plan of Care Patient             Patient will benefit from skilled therapeutic intervention in order to improve the following deficits and impairments:  Decreased mobility, Decreased endurance, Hypomobility, Decreased range of motion, Decreased scar mobility, Improper body mechanics, Postural dysfunction, Impaired flexibility, Decreased strength, Decreased activity tolerance, Abnormal gait, Decreased balance, Difficulty walking, Increased edema, Impaired perceived functional ability  Visit Diagnosis: Joint stiffness of spine  Ankle weakness  Pain in right arm  Muscle weakness (generalized)     Problem List Patient Active Problem List   Diagnosis Date Noted   H/O of hemilaminectomy 02/15/2019   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[place]) 11/17/2018   Fatigue 09/23/2017   History of pneumonia 04/30/2016   Grief reaction 04/30/2016   Vitamin D deficiency 02/22/2015   Hyperlipidemia 01/01/2015   Visit for preventive health examination 12/04/2013   Lower extremity edema 09/02/2013   Essential hypertension 09/02/2013   History of cardiomyopathy 09/02/2013   SVT (supraventricular tachycardia) ([place]) 09/02/2013   Palpitations 09/02/2013   Contrast media allergy 03/10/2013   Fibrocystic  breast disease    Morbid obesity ([place]) 02/05/2012   Meniere disease    Pura Spice, PT, DPT # [phone] 01/03/2021, 8:34 AM  [place] [facility] [address] [place], [place], [address] Phone: [phone]   Fax:  [phone]  Name: Amanda Humphrey MRN: [id number] Date of Birth: [birth year]

## 2021-01-08 ENCOUNTER — Other Ambulatory Visit: Payer: Self-pay

## 2021-01-08 ENCOUNTER — Ambulatory Visit: Payer: BC Managed Care – PPO | Admitting: Physical Therapy

## 2021-01-08 ENCOUNTER — Encounter: Payer: Self-pay | Admitting: Physical Therapy

## 2021-01-08 DIAGNOSIS — S93409A Sprain of unspecified ligament of unspecified ankle, initial encounter: Secondary | ICD-10-CM

## 2021-01-08 DIAGNOSIS — R29898 Other symptoms and signs involving the musculoskeletal system: Secondary | ICD-10-CM

## 2021-01-08 DIAGNOSIS — S96919A Strain of unspecified muscle and tendon at ankle and foot level, unspecified foot, initial encounter: Secondary | ICD-10-CM | POA: Diagnosis not present

## 2021-01-08 DIAGNOSIS — M25571 Pain in right ankle and joints of right foot: Secondary | ICD-10-CM

## 2021-01-08 DIAGNOSIS — M2560 Stiffness of unspecified joint, not elsewhere classified: Secondary | ICD-10-CM | POA: Diagnosis not present

## 2021-01-08 DIAGNOSIS — M79601 Pain in right arm: Secondary | ICD-10-CM | POA: Diagnosis not present

## 2021-01-08 DIAGNOSIS — M6281 Muscle weakness (generalized): Secondary | ICD-10-CM

## 2021-01-08 NOTE — Therapy (Signed)
[facility] [address] [place], [place], [address] Phone: [phone]   Fax:  [phone]  Physical Therapy Treatment Physical Therapy Progress Note   Dates of reporting period  11/20/2020 to 01/08/2021   Patient Details  Name: Amanda Humphrey MRN: [id number] Date of Birth: [birth year] Referring Provider (PT): Orbie Pyo, [place]   Encounter Date: 01/08/2021   PT End of Session - 01/08/21 0828     Visit Number 30    Number of Visits 35    Date for PT Re-Evaluation 01/28/21    Authorization - Visit Number 10    Authorization - Number of Visits 10    PT Start Time 0816    PT Stop Time 0902    PT Time Calculation (min) 46 min    Activity Tolerance Patient tolerated treatment well;Patient limited by pain    Behavior During Therapy [facility] for tasks assessed/performed             Past Medical History:  Diagnosis Date   Allergy    Chronic kidney disease    stones   Fibrocystic breast disease 2013   Meniere disease    Migraines     Past Surgical History:  Procedure Laterality Date   ABDOMINAL HYSTERECTOMY  2011   laprascopic supracervical, [place]   APPENDECTOMY  1999   BREAST BIOPSY Right Nov 2012    fibrocystic disease, [place]   CESAREAN SECTION     4 total   CHOLECYSTECTOMY  2011   [place]    There were no vitals filed for this visit.   Subjective Assessment - 01/08/21 0825     Subjective Pt. reports B cervical/ UT pain prior to tx. session (L>R)- 6/10.  Pt. reports R ankle discomfort over weekend with increase walking.  Discussed shoes with wedding this weekend.    Pertinent History MRI results: Rupture of R ant talofibular ligament. History of severe neck pain that causes radiation to the R UE. Cervical Srugery C6-T1(early 2021). History of bilat plantar fasciitis and heel spurs. Pt's spouse has health complication resulting with increased home ADL reponsibilies. Pt enjoys  visiting and walking on the beach, which she is currently unable to do at this time 2/2 ankle instability and pain.    Limitations Lifting;House hold activities    How long can you sit comfortably? WNL    How long can you stand comfortably? WNL with brace, fear of movement without brace.    How long can you walk comfortably? WNL with brace, fear of movement without brace.    Diagnostic tests MRI    Patient Stated Goals Decrease muscle spasms, return to work    Currently in Pain? Yes    Pain Score 6     Pain Location Neck    Pain Orientation Right;Left    Pain Onset More than a month ago    Pain Onset More than a month ago              Ther.ex.:    B UBE 3 min. F/b (warm-up/ discussed weekend).   Nautilus: 40# standing lat. Pull down/ 30# tricep extension/ 30# chest press/ 40# scapular retraction 20x each.      Seated cervical AROM (all planes)- reassessed goals/ progress note.     Manual Therapy (16 mins) :    Seated and prone STM to L and R upper trap, levator scapulae, rhomboid, teres minor, and infraspinatus.      Trigger Point  Dry Needling (TDN)   Education performed with patient regarding potential benefit of TDN. Reviewed precautions and risks with patient. Reviewed special precautions/risks over lung fields which include pneumothorax. Reviewed signs and symptoms of pneumothorax and advised pt to go to ER immediately if these symptoms develop advise them of dry needling treatment. Pt provided verbal consent to treatment. TDN performed to bilateral suboccipital region/ mid-cervical paraspinals with 2, 0.25 x 60 L-type single needle placements on each side with 1 local twitch response (LTR). Pistoning technique utilized. Improved pain-free motion following intervention.  Increase discomfort reported with L UT trigger point as compared to R UT.  Applied Biofreeze to upper back/ neck region in prone position.          PT Long Term Goals - 01/08/21 1109       PT LONG  TERM GOAL #1   Title Pt. will improve FOTO score to >62 to improve independence with ADLs *foot*    Baseline 42 9/6: 57 10/20:65    Time 6    Period Weeks    Status Achieved    Target Date 11/15/20      PT LONG TERM GOAL #2   Title Pt will improve R ankle MMT to qual to L to faciliate increased stability for return to walks on the beach.    Baseline R/L: 3/5 Ankle dorsiflexion    5/25 bilat Sl calf raise Ankle plantarflexion  *pain with all MMT testing of the ankle/foot*  3/5 ankle inversion   3/5 ankle eversion 9/6: R/L: 4+/5 Ankle dorsiflexion    8/25 bilat Sl calf raise Ankle plantarflexion  *pain with all MMT testing of the ankle/foot*  4/5 ankle inversion   4/5 ankle eversion. 10/20: bilat Sl calf raise Ankle plantarflexion 8/25  *pain with all MMT testing of the ankle/foot*  4+/5 ankle inversion   4+/5 ankle eversion, 4+/5 ankle DF.    Time 6    Period Weeks    Status Achieved    Target Date 12/31/20      PT LONG TERM GOAL #3   Title Pt will be able to complete 20 steps with R ankle pain >3/10 to facilitate greater mobility within the home.    Baseline 7-8/10 NPS 9/6: 2/10 at the lowest. 8 at the worse. Stair climbing is very difficult 10/20: Pt was able to complete >20 steps with 0/10.    Time 6    Period Weeks    Status Achieved    Target Date 11/15/20      PT LONG TERM GOAL #4   Title Pt will be able to complete work/home mobility tasks with laced ankle braced doff >50% for return to work specific foot wear.    Baseline Pt current needs to wear high cushion tennis shoes to work, brace donned 100% 9/6: laced brace doffed during ADLs the last 4 days. PT will wear brace during long work days.10/20: Pt is currently not using laced ankle braced and able to complete all work related tasks.    Time 6    Period Weeks    Status Achieved    Target Date 11/15/20      PT LONG TERM GOAL #5   Title Pt will improve FOTO score to predicted improvement value of 62 in regards to cervical  impairments.    Baseline 52 10/20:47.  12/5: 63    Time 4    Period Weeks    Status Achieved    Target Date 12/31/20  PT LONG TERM GOAL #6   Title Pt will improve worse cervical pain by atleast 3 NPS points to allow increased tolerance during work and home related ADLs.    Baseline NPS: 8/10 current, 7/10 best, 10/10 Worse. 10/20: Worse pain 7/10    Time 4    Period Weeks    Status Partially Met    Target Date 01/28/21      PT LONG TERM GOAL #7   Title Pt will improve bilat UE MMT by atleast 1/2 grade to facilitate greater functional abilities with ADLs related to caregiving.    Baseline UE MMT R/L:   Shoulder Flexion: 4/4-  Shoulder abduction: 4/4-  Shoulder ER: 4/4  Shoulder IR: 4/4  Elbow ext: 5/5   Eblow flex: 5/5 10/20: UE MMT R/L:   Shoulder Flexion: 4/4  Shoulder abduction: 4/4 Shoulder ER: 4/4  Shoulder IR: 4/4  Elbow ext: 5/5   Eblow flex: 5/5    Time 6    Period Weeks    Status Partially Met    Target Date 01/28/21      PT LONG TERM GOAL #8   Title Pt will improve functional R hand strength equal to L to promote return of hand useage during grooming and feeding.    Baseline Grip R/L: 76.1 / 33.9    Lateral prehension grip R/L: 14 / 4 10/20: Grip R/L: 62.0 / 41.4    Lateral prehension grip R/L: 13 / 6    Time 6    Period Weeks    Status Partially Met    Target Date 01/28/21                   Plan - 01/08/21 6789     Clinical Impression Statement B UT/lower cervical muscle discomfort with repeated movement and minimal compensatory movement patterns with resisted UE Nautilus.  Good STM to B mid-thoracic/ cervical paraspinals/ deltoid musculature.  1 trigger point noted on R UT and PT performed TDN.  No change to HEP and PT will focus on manual therapy next tx. prior to pts. busy wedding weekend for son.    Personal Factors and Comorbidities Comorbidity 2;Profession;Past/Current Experience    Comorbidities Hypertension, Obesity    Examination-Activity  Limitations Squat;Locomotion Level    Examination-Participation Restrictions Occupation;Cleaning;Laundry    Stability/Clinical Decision Making Evolving/Moderate complexity    Clinical Decision Making Moderate    Rehab Potential Good    PT Frequency 2x / week    PT Duration 4 weeks    PT Treatment/Interventions ADLs/Self Care Home Management;Aquatic Therapy;Biofeedback;Cryotherapy;Electrical Stimulation;Moist Heat;Ultrasound;Functional mobility training;Therapeutic activities;Therapeutic exercise;Balance training;Neuromuscular re-education;Patient/family education;Manual techniques;Scar mobilization;Passive range of motion;Dry needling;Energy conservation;Taping;Gait training;Stair training;DME Instruction;Spinal Manipulations;Joint Manipulations    PT Next Visit Plan Progress HEP/ functional strengthening    PT Home Exercise Plan [id number].  New HEP: [id number]    Consulted and Agree with Plan of Care Patient             Patient will benefit from skilled therapeutic intervention in order to improve the following deficits and impairments:  Decreased mobility, Decreased endurance, Hypomobility, Decreased range of motion, Decreased scar mobility, Improper body mechanics, Postural dysfunction, Impaired flexibility, Decreased strength, Decreased activity tolerance, Abnormal gait, Decreased balance, Difficulty walking, Increased edema, Impaired perceived functional ability  Visit Diagnosis: Joint stiffness of spine  Ankle weakness  Pain in right arm  Muscle weakness (generalized)  Pain in right ankle and joints of right foot  Sprain and strain of ankle     Problem List  Patient Active Problem List   Diagnosis Date Noted   H/O of hemilaminectomy 02/15/2019   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[place]) 11/17/2018   Fatigue 09/23/2017   History of pneumonia 04/30/2016   Grief reaction 04/30/2016   Vitamin D deficiency 02/22/2015   Hyperlipidemia 01/01/2015   Visit for preventive  health examination 12/04/2013   Lower extremity edema 09/02/2013   Essential hypertension 09/02/2013   History of cardiomyopathy 09/02/2013   SVT (supraventricular tachycardia) ([place]) 09/02/2013   Palpitations 09/02/2013   Contrast media allergy 03/10/2013   Fibrocystic breast disease    Morbid obesity ([place]) 02/05/2012   Meniere disease    Pura Spice, PT, DPT # [id number] 01/08/2021, 11:11 AM  [facility] [address] [place], [place], [address] Phone: [phone]   Fax:  [phone]  Name: Amanda Humphrey MRN: [id number] Date of Birth: Feb 14, 1965

## 2021-01-10 ENCOUNTER — Ambulatory Visit: Payer: BC Managed Care – PPO | Admitting: Physical Therapy

## 2021-01-10 ENCOUNTER — Other Ambulatory Visit: Payer: Self-pay

## 2021-01-10 ENCOUNTER — Encounter: Payer: Self-pay | Admitting: Physical Therapy

## 2021-01-10 DIAGNOSIS — S96919A Strain of unspecified muscle and tendon at ankle and foot level, unspecified foot, initial encounter: Secondary | ICD-10-CM | POA: Diagnosis not present

## 2021-01-10 DIAGNOSIS — M79601 Pain in right arm: Secondary | ICD-10-CM | POA: Diagnosis not present

## 2021-01-10 DIAGNOSIS — M2560 Stiffness of unspecified joint, not elsewhere classified: Secondary | ICD-10-CM

## 2021-01-10 DIAGNOSIS — R29898 Other symptoms and signs involving the musculoskeletal system: Secondary | ICD-10-CM | POA: Diagnosis not present

## 2021-01-10 DIAGNOSIS — M25571 Pain in right ankle and joints of right foot: Secondary | ICD-10-CM | POA: Diagnosis not present

## 2021-01-10 DIAGNOSIS — S93409A Sprain of unspecified ligament of unspecified ankle, initial encounter: Secondary | ICD-10-CM | POA: Diagnosis not present

## 2021-01-10 DIAGNOSIS — M6281 Muscle weakness (generalized): Secondary | ICD-10-CM | POA: Diagnosis not present

## 2021-01-10 NOTE — Therapy (Signed)
[facility] [address] [place], [place], [address] Phone: [phone]   Fax:  [phone]  Physical Therapy Treatment  Patient Details  Name: Amanda Humphrey MRN: [id number] Date of Birth: 1965-06-12 Referring Provider (PT): Orbie Pyo, [place]   Encounter Date: 01/10/2021   PT End of Session - 01/10/21 0734     Visit Number 31    Number of Visits 35    Date for PT Re-Evaluation 01/28/21    Authorization - Visit Number 1    Authorization - Number of Visits 10    PT Start Time 5366    PT Stop Time 0814    PT Time Calculation (min) 39 min    Activity Tolerance Patient tolerated treatment well;Patient limited by pain    Behavior During Therapy [facility] for tasks assessed/performed             Past Medical History:  Diagnosis Date   Allergy    Chronic kidney disease    stones   Fibrocystic breast disease 2013   Meniere disease    Migraines     Past Surgical History:  Procedure Laterality Date   ABDOMINAL HYSTERECTOMY  2011   laprascopic supracervical, [place]   APPENDECTOMY  1999   BREAST BIOPSY Right Nov 2012    fibrocystic disease, [place]   CESAREAN SECTION     4 total   CHOLECYSTECTOMY  2011   [place]    There were no vitals filed for this visit.   Subjective Assessment - 01/10/21 0734     Subjective Pt. states she had a long day at work last night and was aware of her body mechanics/ posture.  Pt. c/o neck pain this morning.    Pertinent History MRI results: Rupture of R ant talofibular ligament. History of severe neck pain that causes radiation to the R UE. Cervical Srugery C6-T1(early 2021). History of bilat plantar fasciitis and heel spurs. Pt's spouse has health complication resulting with increased home ADL reponsibilies. Pt enjoys visiting and walking on the beach, which she is currently unable to do at this time 2/2 ankle instability and pain.    Limitations Lifting;House  hold activities    How long can you sit comfortably? WNL    How long can you stand comfortably? WNL with brace, fear of movement without brace.    How long can you walk comfortably? WNL with brace, fear of movement without brace.    Diagnostic tests MRI    Patient Stated Goals Decrease muscle spasms, return to work    Currently in Pain? Yes    Pain Score 6     Pain Location Neck    Pain Orientation Right;Left;Lower    Pain Onset More than a month ago    Pain Onset More than a month ago              No therex.    Manual Therapy (16 mins) :    Supine/ prone STM to L and R upper trap, levator scapulae, rhomboid, teres minor, and infraspinatus.   Static stretches to L/R UT 2x30 sec.  Cervical traction 3x with holds.     Trigger Point Dry Needling (TDN)   Education performed with patient regarding potential benefit of TDN. Reviewed precautions and risks with patient. Reviewed special precautions/risks over lung fields which include pneumothorax. Reviewed signs and symptoms of pneumothorax and advised pt to go to ER immediately if these symptoms develop advise them of  dry needling treatment. Pt provided verbal consent to treatment. TDN performed to bilateral suboccipital region/ mid-cervical paraspinals with 3, 0.25 x 60 L-type single needle placements on each side with 1 local twitch response (LTR). Pistoning technique utilized. Improved pain-free motion following intervention.  Increase discomfort reported with R suboccipital region.  Applied Biofreeze to upper back/ neck region in prone position.          PT Long Term Goals - 01/08/21 1109       PT LONG TERM GOAL #1   Title Pt. will improve FOTO score to >62 to improve independence with ADLs *foot*    Baseline 42 9/6: 57 10/20:65    Time 6    Period Weeks    Status Achieved    Target Date 11/15/20      PT LONG TERM GOAL #2   Title Pt will improve R ankle MMT to qual to L to faciliate increased stability for return to walks  on the beach.    Baseline R/L: 3/5 Ankle dorsiflexion    5/25 bilat Sl calf raise Ankle plantarflexion  *pain with all MMT testing of the ankle/foot*  3/5 ankle inversion   3/5 ankle eversion 9/6: R/L: 4+/5 Ankle dorsiflexion    8/25 bilat Sl calf raise Ankle plantarflexion  *pain with all MMT testing of the ankle/foot*  4/5 ankle inversion   4/5 ankle eversion. 10/20: bilat Sl calf raise Ankle plantarflexion 8/25  *pain with all MMT testing of the ankle/foot*  4+/5 ankle inversion   4+/5 ankle eversion, 4+/5 ankle DF.    Time 6    Period Weeks    Status Achieved    Target Date 12/31/20      PT LONG TERM GOAL #3   Title Pt will be able to complete 20 steps with R ankle pain >3/10 to facilitate greater mobility within the home.    Baseline 7-8/10 NPS 9/6: 2/10 at the lowest. 8 at the worse. Stair climbing is very difficult 10/20: Pt was able to complete >20 steps with 0/10.    Time 6    Period Weeks    Status Achieved    Target Date 11/15/20      PT LONG TERM GOAL #4   Title Pt will be able to complete work/home mobility tasks with laced ankle braced doff >50% for return to work specific foot wear.    Baseline Pt current needs to wear high cushion tennis shoes to work, brace donned 100% 9/6: laced brace doffed during ADLs the last 4 days. PT will wear brace during long work days.10/20: Pt is currently not using laced ankle braced and able to complete all work related tasks.    Time 6    Period Weeks    Status Achieved    Target Date 11/15/20      PT LONG TERM GOAL #5   Title Pt will improve FOTO score to predicted improvement value of 62 in regards to cervical impairments.    Baseline 52 10/20:47.  12/5: 63    Time 4    Period Weeks    Status Achieved    Target Date 12/31/20      PT LONG TERM GOAL #6   Title Pt will improve worse cervical pain by atleast 3 NPS points to allow increased tolerance during work and home related ADLs.    Baseline NPS: 8/10 current, 7/10 best, 10/10 Worse.  10/20: Worse pain 7/10    Time 4    Period Weeks  Status Partially Met    Target Date 01/28/21      PT LONG TERM GOAL #7   Title Pt will improve bilat UE MMT by atleast 1/2 grade to facilitate greater functional abilities with ADLs related to caregiving.    Baseline UE MMT R/L:   Shoulder Flexion: 4/4-  Shoulder abduction: 4/4-  Shoulder ER: 4/4  Shoulder IR: 4/4  Elbow ext: 5/5   Eblow flex: 5/5 10/20: UE MMT R/L:   Shoulder Flexion: 4/4  Shoulder abduction: 4/4 Shoulder ER: 4/4  Shoulder IR: 4/4  Elbow ext: 5/5   Eblow flex: 5/5    Time 6    Period Weeks    Status Partially Met    Target Date 01/28/21      PT LONG TERM GOAL #8   Title Pt will improve functional R hand strength equal to L to promote return of hand useage during grooming and feeding.    Baseline Grip R/L: 76.1 / 33.9    Lateral prehension grip R/L: 14 / 4 10/20: Grip R/L: 62.0 / 41.4    Lateral prehension grip R/L: 13 / 6    Time 6    Period Weeks    Status Partially Met    Target Date 01/28/21                   Plan - 01/10/21 1021     Clinical Impression Statement Pt. presents with good cervical AROM (all planes) but tenderness noted with palpation over B suboccipital/ UT region during manual tx.  No trigger points noted in B UT today but stiffness/ discomfort in suboccipital region.  Pt. tolerates TDN to base of suboccipital region with 1 muscle fasciculation noted at R suboccipital trigger point.  No change to HEP.    Personal Factors and Comorbidities Comorbidity 2;Profession;Past/Current Experience    Comorbidities Hypertension, Obesity    Examination-Activity Limitations Squat;Locomotion Level    Examination-Participation Restrictions Occupation;Cleaning;Laundry    Stability/Clinical Decision Making Evolving/Moderate complexity    Clinical Decision Making Moderate    Rehab Potential Good    PT Frequency 2x / week    PT Duration 4 weeks    PT Treatment/Interventions ADLs/Self Care Home  Management;Aquatic Therapy;Biofeedback;Cryotherapy;Electrical Stimulation;Moist Heat;Ultrasound;Functional mobility training;Therapeutic activities;Therapeutic exercise;Balance training;Neuromuscular re-education;Patient/family education;Manual techniques;Scar mobilization;Passive range of motion;Dry needling;Energy conservation;Taping;Gait training;Stair training;DME Instruction;Spinal Manipulations;Joint Manipulations    PT Next Visit Plan Progress HEP/ functional strengthening    PT Home Exercise Plan [id number].  New HEP: [id number]    Consulted and Agree with Plan of Care Patient             Patient will benefit from skilled therapeutic intervention in order to improve the following deficits and impairments:  Decreased mobility, Decreased endurance, Hypomobility, Decreased range of motion, Decreased scar mobility, Improper body mechanics, Postural dysfunction, Impaired flexibility, Decreased strength, Decreased activity tolerance, Abnormal gait, Decreased balance, Difficulty walking, Increased edema, Impaired perceived functional ability  Visit Diagnosis: Joint stiffness of spine  Ankle weakness  Pain in right arm  Muscle weakness (generalized)     Problem List Patient Active Problem List   Diagnosis Date Noted   H/O of hemilaminectomy 02/15/2019   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[place]) 11/17/2018   Fatigue 09/23/2017   History of pneumonia 04/30/2016   Grief reaction 04/30/2016   Vitamin D deficiency 02/22/2015   Hyperlipidemia 01/01/2015   Visit for preventive health examination 12/04/2013   Lower extremity edema 09/02/2013   Essential hypertension 09/02/2013   History of cardiomyopathy 09/02/2013  SVT (supraventricular tachycardia) ([place]) 09/02/2013   Palpitations 09/02/2013   Contrast media allergy 03/10/2013   Fibrocystic breast disease    Morbid obesity ([place]) 02/05/2012   Meniere disease    Pura Spice, PT, DPT # [id number] 01/10/2021, 8:36 AM  [facility] [address]. [place], [place], [address] Phone: [phone]   Fax:  [phone]  Name: Amanda Humphrey MRN: [id number] Date of Birth: [birth year]

## 2021-01-15 ENCOUNTER — Encounter: Payer: Self-pay | Admitting: Physical Therapy

## 2021-01-15 ENCOUNTER — Ambulatory Visit: Payer: BC Managed Care – PPO | Admitting: Physical Therapy

## 2021-01-15 DIAGNOSIS — M25571 Pain in right ankle and joints of right foot: Secondary | ICD-10-CM

## 2021-01-15 DIAGNOSIS — S96919A Strain of unspecified muscle and tendon at ankle and foot level, unspecified foot, initial encounter: Secondary | ICD-10-CM | POA: Diagnosis not present

## 2021-01-15 DIAGNOSIS — R29898 Other symptoms and signs involving the musculoskeletal system: Secondary | ICD-10-CM | POA: Diagnosis not present

## 2021-01-15 DIAGNOSIS — M6281 Muscle weakness (generalized): Secondary | ICD-10-CM | POA: Diagnosis not present

## 2021-01-15 DIAGNOSIS — M79601 Pain in right arm: Secondary | ICD-10-CM

## 2021-01-15 DIAGNOSIS — S93409A Sprain of unspecified ligament of unspecified ankle, initial encounter: Secondary | ICD-10-CM | POA: Diagnosis not present

## 2021-01-15 DIAGNOSIS — M2560 Stiffness of unspecified joint, not elsewhere classified: Secondary | ICD-10-CM | POA: Diagnosis not present

## 2021-01-15 NOTE — Therapy (Signed)
[facility] [address] [place], [place], [address] Phone: [phone]   Fax:  [phone]  Physical Therapy Treatment  Patient Details  Name: Amanda Humphrey MRN: [id number] Date of Birth: 1965-08-08 Referring Provider (PT): Orbie Pyo, [place]   Encounter Date: 01/15/2021   PT End of Session - 01/15/21 0738     Visit Number 32    Number of Visits 35    Date for PT Re-Evaluation 01/28/21    Authorization - Visit Number 2    Authorization - Number of Visits 10    PT Start Time 0732    PT Stop Time 0816    PT Time Calculation (min) 44 min    Activity Tolerance Patient tolerated treatment well;Patient limited by pain    Behavior During Therapy [facility] for tasks assessed/performed             Past Medical History:  Diagnosis Date   Allergy    Chronic kidney disease    stones   Fibrocystic breast disease 2013   Meniere disease    Migraines     Past Surgical History:  Procedure Laterality Date   ABDOMINAL HYSTERECTOMY  2011   laprascopic supracervical, [place]   APPENDECTOMY  1999   BREAST BIOPSY Right Nov 2012    fibrocystic disease, [place]   CESAREAN SECTION     4 total   CHOLECYSTECTOMY  2011   [place]    There were no vitals filed for this visit.   Subjective Assessment - 01/15/21 0730     Subjective Pt. had a good weekend but was really active at son's wedding.  Pt. states her neck has been sore.    Pertinent History MRI results: Rupture of R ant talofibular ligament. History of severe neck pain that causes radiation to the R UE. Cervical Srugery C6-T1(early 2021). History of bilat plantar fasciitis and heel spurs. Pt's spouse has health complication resulting with increased home ADL reponsibilies. Pt enjoys visiting and walking on the beach, which she is currently unable to do at this time 2/2 ankle instability and pain.    Limitations Lifting;House hold activities    How long  can you sit comfortably? WNL    How long can you stand comfortably? WNL with brace, fear of movement without brace.    How long can you walk comfortably? WNL with brace, fear of movement without brace.    Diagnostic tests MRI    Patient Stated Goals Decrease muscle spasms, return to work    Currently in Pain? Yes    Pain Score 7     Pain Location Neck    Pain Orientation Right;Left    Pain Descriptors / Indicators Aching;Constant    Pain Type Chronic pain    Pain Onset More than a month ago    Pain Onset More than a month ago                 Ther.ex.:      Seated cervical AROM (all planes)- no resisted there.ex. today.       Manual Therapy (39 mins) :    Seated and prone STM to L and R upper trap, levator scapulae, rhomboid, teres minor, and infraspinatus.     Supine manual cervical traction/ suboccipital release technique 5x with static holds.    Prone mobs to upper/low thoracic spine (unilateral/ central grade II-III PA mobs)- 2x20 sec. Each.         PT  Long Term Goals - 01/08/21 1109       PT LONG TERM GOAL #1   Title Pt. will improve FOTO score to >62 to improve independence with ADLs *foot*    Baseline 42 9/6: 57 10/20:65    Time 6    Period Weeks    Status Achieved    Target Date 11/15/20      PT LONG TERM GOAL #2   Title Pt will improve R ankle MMT to qual to L to faciliate increased stability for return to walks on the beach.    Baseline R/L: 3/5 Ankle dorsiflexion    5/25 bilat Sl calf raise Ankle plantarflexion  *pain with all MMT testing of the ankle/foot*  3/5 ankle inversion   3/5 ankle eversion 9/6: R/L: 4+/5 Ankle dorsiflexion    8/25 bilat Sl calf raise Ankle plantarflexion  *pain with all MMT testing of the ankle/foot*  4/5 ankle inversion   4/5 ankle eversion. 10/20: bilat Sl calf raise Ankle plantarflexion 8/25  *pain with all MMT testing of the ankle/foot*  4+/5 ankle inversion   4+/5 ankle eversion, 4+/5 ankle DF.    Time 6    Period Weeks     Status Achieved    Target Date 12/31/20      PT LONG TERM GOAL #3   Title Pt will be able to complete 20 steps with R ankle pain >3/10 to facilitate greater mobility within the home.    Baseline 7-8/10 NPS 9/6: 2/10 at the lowest. 8 at the worse. Stair climbing is very difficult 10/20: Pt was able to complete >20 steps with 0/10.    Time 6    Period Weeks    Status Achieved    Target Date 11/15/20      PT LONG TERM GOAL #4   Title Pt will be able to complete work/home mobility tasks with laced ankle braced doff >50% for return to work specific foot wear.    Baseline Pt current needs to wear high cushion tennis shoes to work, brace donned 100% 9/6: laced brace doffed during ADLs the last 4 days. PT will wear brace during long work days.10/20: Pt is currently not using laced ankle braced and able to complete all work related tasks.    Time 6    Period Weeks    Status Achieved    Target Date 11/15/20      PT LONG TERM GOAL #5   Title Pt will improve FOTO score to predicted improvement value of 62 in regards to cervical impairments.    Baseline 52 10/20:47.  12/5: 63    Time 4    Period Weeks    Status Achieved    Target Date 12/31/20      PT LONG TERM GOAL #6   Title Pt will improve worse cervical pain by atleast 3 NPS points to allow increased tolerance during work and home related ADLs.    Baseline NPS: 8/10 current, 7/10 best, 10/10 Worse. 10/20: Worse pain 7/10    Time 4    Period Weeks    Status Partially Met    Target Date 01/28/21      PT LONG TERM GOAL #7   Title Pt will improve bilat UE MMT by atleast 1/2 grade to facilitate greater functional abilities with ADLs related to caregiving.    Baseline UE MMT R/L:   Shoulder Flexion: 4/4-  Shoulder abduction: 4/4-  Shoulder ER: 4/4  Shoulder IR: 4/4  Elbow ext: 5/5  Eblow flex: 5/5 10/20: UE MMT R/L:   Shoulder Flexion: 4/4  Shoulder abduction: 4/4 Shoulder ER: 4/4  Shoulder IR: 4/4  Elbow ext: 5/5   Eblow flex: 5/5    Time  6    Period Weeks    Status Partially Met    Target Date 01/28/21      PT LONG TERM GOAL #8   Title Pt will improve functional R hand strength equal to L to promote return of hand useage during grooming and feeding.    Baseline Grip R/L: 76.1 / 33.9    Lateral prehension grip R/L: 14 / 4 10/20: Grip R/L: 62.0 / 41.4    Lateral prehension grip R/L: 13 / 6    Time 6    Period Weeks    Status Partially Met    Target Date 01/28/21                   Plan - 01/15/21 1219     Clinical Impression Statement No resisted ther.ex. during tx. today with focus on cervical/ thoracic stretches and manual tx.  Pt. presents with generalized muscle soreness in B upper thoracic/ cervical paraspinals with minimal trigger points noted.  No trigger point dry needling today.  PT will reassess need for TDN next tx. session and return to resisted strengthening ex. program.  No change in HEP.    Personal Factors and Comorbidities Comorbidity 2;Profession;Past/Current Experience    Comorbidities Hypertension, Obesity    Examination-Activity Limitations Squat;Locomotion Level    Examination-Participation Restrictions Occupation;Cleaning;Laundry    Stability/Clinical Decision Making Evolving/Moderate complexity    Clinical Decision Making Moderate    Rehab Potential Good    PT Frequency 2x / week    PT Duration 4 weeks    PT Treatment/Interventions ADLs/Self Care Home Management;Aquatic Therapy;Biofeedback;Cryotherapy;Electrical Stimulation;Moist Heat;Ultrasound;Functional mobility training;Therapeutic activities;Therapeutic exercise;Balance training;Neuromuscular re-education;Patient/family education;Manual techniques;Scar mobilization;Passive range of motion;Dry needling;Energy conservation;Taping;Gait training;Stair training;DME Instruction;Spinal Manipulations;Joint Manipulations    PT Next Visit Plan Progress HEP/ functional strengthening    PT Home Exercise Plan [id number].  New HEP: [id number]     Consulted and Agree with Plan of Care Patient             Patient will benefit from skilled therapeutic intervention in order to improve the following deficits and impairments:  Decreased mobility, Decreased endurance, Hypomobility, Decreased range of motion, Decreased scar mobility, Improper body mechanics, Postural dysfunction, Impaired flexibility, Decreased strength, Decreased activity tolerance, Abnormal gait, Decreased balance, Difficulty walking, Increased edema, Impaired perceived functional ability  Visit Diagnosis: Joint stiffness of spine  Ankle weakness  Pain in right arm  Muscle weakness (generalized)  Pain in right ankle and joints of right foot  Sprain and strain of ankle     Problem List Patient Active Problem List   Diagnosis Date Noted   H/O of hemilaminectomy 02/15/2019   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[place]) 11/17/2018   Fatigue 09/23/2017   History of pneumonia 04/30/2016   Grief reaction 04/30/2016   Vitamin D deficiency 02/22/2015   Hyperlipidemia 01/01/2015   Visit for preventive health examination 12/04/2013   Lower extremity edema 09/02/2013   Essential hypertension 09/02/2013   History of cardiomyopathy 09/02/2013   SVT (supraventricular tachycardia) ([place]) 09/02/2013   Palpitations 09/02/2013   Contrast media allergy 03/10/2013   Fibrocystic breast disease    Morbid obesity ([place]) 02/05/2012   Meniere disease    Pura Spice, PT, DPT # [id number] 01/15/2021, 12:21 PM  [place]  [facility] [address] [place], [place], [address] Phone: [phone]   Fax:  [phone]  Name: MARGEART ALLENDER MRN: [id number] Date of Birth: 11-08-1965

## 2021-01-17 ENCOUNTER — Other Ambulatory Visit: Payer: Self-pay

## 2021-01-17 ENCOUNTER — Encounter: Payer: Self-pay | Admitting: Physical Therapy

## 2021-01-17 ENCOUNTER — Ambulatory Visit: Payer: BC Managed Care – PPO | Admitting: Physical Therapy

## 2021-01-17 DIAGNOSIS — M79601 Pain in right arm: Secondary | ICD-10-CM | POA: Diagnosis not present

## 2021-01-17 DIAGNOSIS — M2560 Stiffness of unspecified joint, not elsewhere classified: Secondary | ICD-10-CM

## 2021-01-17 DIAGNOSIS — S96919A Strain of unspecified muscle and tendon at ankle and foot level, unspecified foot, initial encounter: Secondary | ICD-10-CM

## 2021-01-17 DIAGNOSIS — S93409A Sprain of unspecified ligament of unspecified ankle, initial encounter: Secondary | ICD-10-CM | POA: Diagnosis not present

## 2021-01-17 DIAGNOSIS — M6281 Muscle weakness (generalized): Secondary | ICD-10-CM | POA: Diagnosis not present

## 2021-01-17 DIAGNOSIS — M25571 Pain in right ankle and joints of right foot: Secondary | ICD-10-CM | POA: Diagnosis not present

## 2021-01-17 DIAGNOSIS — R29898 Other symptoms and signs involving the musculoskeletal system: Secondary | ICD-10-CM | POA: Diagnosis not present

## 2021-01-17 NOTE — Therapy (Signed)
[facility] [address] [place], [place], [address] Phone: [phone]   Fax:  [phone]  Physical Therapy Treatment  Patient Details  Name: Amanda Humphrey MRN: [id number] Date of Birth: 1965-10-22 Referring Provider (PT): Orbie Pyo, [place]   Encounter Date: 01/17/2021   PT End of Session - 01/17/21 0728     Visit Number 33    Number of Visits 35    Date for PT Re-Evaluation 01/28/21    Authorization - Visit Number 3    Authorization - Number of Visits 10    PT Start Time 5701    PT Stop Time 0816    PT Time Calculation (min) 41 min    Activity Tolerance Patient tolerated treatment well;Patient limited by pain    Behavior During Therapy [facility] for tasks assessed/performed             Past Medical History:  Diagnosis Date   Allergy    Chronic kidney disease    stones   Fibrocystic breast disease 2013   Meniere disease    Migraines     Past Surgical History:  Procedure Laterality Date   ABDOMINAL HYSTERECTOMY  2011   laprascopic supracervical, [place]   APPENDECTOMY  1999   BREAST BIOPSY Right Nov 2012    fibrocystic disease, [place]   CESAREAN SECTION     4 total   CHOLECYSTECTOMY  2011   [place]    There were no vitals filed for this visit.   Subjective Assessment - 01/17/21 0728     Subjective Pt. reports she cleaned house yesterday and her back is sore/ hurting today.    Pertinent History MRI results: Rupture of R ant talofibular ligament. History of severe neck pain that causes radiation to the R UE. Cervical Srugery C6-T1(early 2021). History of bilat plantar fasciitis and heel spurs. Pt's spouse has health complication resulting with increased home ADL reponsibilies. Pt enjoys visiting and walking on the beach, which she is currently unable to do at this time 2/2 ankle instability and pain.    Limitations Lifting;House hold activities    How long can you sit  comfortably? WNL    How long can you stand comfortably? WNL with brace, fear of movement without brace.    How long can you walk comfortably? WNL with brace, fear of movement without brace.    Diagnostic tests MRI    Patient Stated Goals Decrease muscle spasms, return to work    Currently in Pain? Yes    Pain Score 7     Pain Location Back    Pain Orientation Right;Left;Mid;Lower;Upper    Pain Onset More than a month ago    Pain Onset More than a month ago                 No therex.     Manual Therapy:    Reassessment of B shoulder AROM/ cervical rotn. And lateral flexion in supine.   Supine/ prone STM to L and R upper trap, levator scapulae, rhomboid, teres minor, and infraspinatus.   Static stretches to L/R UT 2x30 sec.  Cervical traction an suboccipital release 3x with holds.     Trigger Point Dry Needling (TDN)   Education performed with patient regarding potential benefit of TDN. Reviewed precautions and risks with patient. Reviewed special precautions/risks over lung fields which include pneumothorax. Reviewed signs and symptoms of pneumothorax and advised pt to go to ER immediately if these  symptoms develop advise them of dry needling treatment. Pt provided verbal consent to treatment. TDN performed to bilateral suboccipital region/ mid-cervical paraspinals with 3, 0.30 x 60 L-type single needle placements on each side with 1 local twitch response (LTR). Pistoning technique utilized. Improved pain-free motion following intervention.  Increase discomfort reported with suboccipital region.  Applied Biofreeze to upper back/ neck region in prone position.        PT Long Term Goals - 01/08/21 1109       PT LONG TERM GOAL #1   Title Pt. will improve FOTO score to >62 to improve independence with ADLs *foot*    Baseline 42 9/6: 57 10/20:65    Time 6    Period Weeks    Status Achieved    Target Date 11/15/20      PT LONG TERM GOAL #2   Title Pt will improve R ankle  MMT to qual to L to faciliate increased stability for return to walks on the beach.    Baseline R/L: 3/5 Ankle dorsiflexion    5/25 bilat Sl calf raise Ankle plantarflexion  *pain with all MMT testing of the ankle/foot*  3/5 ankle inversion   3/5 ankle eversion 9/6: R/L: 4+/5 Ankle dorsiflexion    8/25 bilat Sl calf raise Ankle plantarflexion  *pain with all MMT testing of the ankle/foot*  4/5 ankle inversion   4/5 ankle eversion. 10/20: bilat Sl calf raise Ankle plantarflexion 8/25  *pain with all MMT testing of the ankle/foot*  4+/5 ankle inversion   4+/5 ankle eversion, 4+/5 ankle DF.    Time 6    Period Weeks    Status Achieved    Target Date 12/31/20      PT LONG TERM GOAL #3   Title Pt will be able to complete 20 steps with R ankle pain >3/10 to facilitate greater mobility within the home.    Baseline 7-8/10 NPS 9/6: 2/10 at the lowest. 8 at the worse. Stair climbing is very difficult 10/20: Pt was able to complete >20 steps with 0/10.    Time 6    Period Weeks    Status Achieved    Target Date 11/15/20      PT LONG TERM GOAL #4   Title Pt will be able to complete work/home mobility tasks with laced ankle braced doff >50% for return to work specific foot wear.    Baseline Pt current needs to wear high cushion tennis shoes to work, brace donned 100% 9/6: laced brace doffed during ADLs the last 4 days. PT will wear brace during long work days.10/20: Pt is currently not using laced ankle braced and able to complete all work related tasks.    Time 6    Period Weeks    Status Achieved    Target Date 11/15/20      PT LONG TERM GOAL #5   Title Pt will improve FOTO score to predicted improvement value of 62 in regards to cervical impairments.    Baseline 52 10/20:47.  12/5: 63    Time 4    Period Weeks    Status Achieved    Target Date 12/31/20      PT LONG TERM GOAL #6   Title Pt will improve worse cervical pain by atleast 3 NPS points to allow increased tolerance during work and home  related ADLs.    Baseline NPS: 8/10 current, 7/10 best, 10/10 Worse. 10/20: Worse pain 7/10    Time 4    Period  Weeks    Status Partially Met    Target Date 01/28/21      PT LONG TERM GOAL #7   Title Pt will improve bilat UE MMT by atleast 1/2 grade to facilitate greater functional abilities with ADLs related to caregiving.    Baseline UE MMT R/L:   Shoulder Flexion: 4/4-  Shoulder abduction: 4/4-  Shoulder ER: 4/4  Shoulder IR: 4/4  Elbow ext: 5/5   Eblow flex: 5/5 10/20: UE MMT R/L:   Shoulder Flexion: 4/4  Shoulder abduction: 4/4 Shoulder ER: 4/4  Shoulder IR: 4/4  Elbow ext: 5/5   Eblow flex: 5/5    Time 6    Period Weeks    Status Partially Met    Target Date 01/28/21      PT LONG TERM GOAL #8   Title Pt will improve functional R hand strength equal to L to promote return of hand useage during grooming and feeding.    Baseline Grip R/L: 76.1 / 33.9    Lateral prehension grip R/L: 14 / 4 10/20: Grip R/L: 62.0 / 41.4    Lateral prehension grip R/L: 13 / 6    Time 6    Period Weeks    Status Partially Met    Target Date 01/28/21                   Plan - 01/17/21 5625     Clinical Impression Statement Tx. focused on manual stretches/ mobs./ TDN today to manage trigger points and pain.  No ther.ex. today and pt. tolerates TDN to several trigger points in R/L mid-low trap. today.  Minimal UT trigger points and tenderness focused more on mid-trap/ rhomboids.  Good cervical/ B shoulder AROM in supine position.  Benefits from suboccipital manual release techniques in supine position to cervical spine.  Pt. has 2 more PT tx. sessions scheduled for next week.    Personal Factors and Comorbidities Comorbidity 2;Profession;Past/Current Experience    Comorbidities Hypertension, Obesity    Examination-Activity Limitations Squat;Locomotion Level    Examination-Participation Restrictions Occupation;Cleaning;Laundry    Stability/Clinical Decision Making Evolving/Moderate complexity     Clinical Decision Making Moderate    Rehab Potential Good    PT Frequency 2x / week    PT Duration 4 weeks    PT Treatment/Interventions ADLs/Self Care Home Management;Aquatic Therapy;Biofeedback;Cryotherapy;Electrical Stimulation;Moist Heat;Ultrasound;Functional mobility training;Therapeutic activities;Therapeutic exercise;Balance training;Neuromuscular re-education;Patient/family education;Manual techniques;Scar mobilization;Passive range of motion;Dry needling;Energy conservation;Taping;Gait training;Stair training;DME Instruction;Spinal Manipulations;Joint Manipulations    PT Next Visit Plan Progress HEP/ functional strengthening.  Pt. has 2 more PT tx. sessions next week.    PT Home Exercise Plan [id number].  New HEP: [id number]    Consulted and Agree with Plan of Care Patient             Patient will benefit from skilled therapeutic intervention in order to improve the following deficits and impairments:  Decreased mobility, Decreased endurance, Hypomobility, Decreased range of motion, Decreased scar mobility, Improper body mechanics, Postural dysfunction, Impaired flexibility, Decreased strength, Decreased activity tolerance, Abnormal gait, Decreased balance, Difficulty walking, Increased edema, Impaired perceived functional ability  Visit Diagnosis: Joint stiffness of spine  Ankle weakness  Pain in right arm  Muscle weakness (generalized)  Pain in right ankle and joints of right foot  Sprain and strain of ankle     Problem List Patient Active Problem List   Diagnosis Date Noted   H/O of hemilaminectomy 02/15/2019   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[place]) 11/17/2018   Fatigue 09/23/2017  History of pneumonia 04/30/2016   Grief reaction 04/30/2016   Vitamin D deficiency 02/22/2015   Hyperlipidemia 01/01/2015   Visit for preventive health examination 12/04/2013   Lower extremity edema 09/02/2013   Essential hypertension 09/02/2013   History of cardiomyopathy  09/02/2013   SVT (supraventricular tachycardia) ([place]) 09/02/2013   Palpitations 09/02/2013   Contrast media allergy 03/10/2013   Fibrocystic breast disease    Morbid obesity ([place]) 02/05/2012   Meniere disease    Pura Spice, PT, DPT # [phone] 01/18/2021, 3:23 AM  [facility] [address] [place], [place], [address] Phone: [phone]   Fax:  [phone]  Name: DEWEY NEUKAM MRN: [id number] Date of Birth: 1965-07-11

## 2021-01-22 ENCOUNTER — Encounter: Payer: Self-pay | Admitting: Physical Therapy

## 2021-01-22 ENCOUNTER — Ambulatory Visit: Payer: BC Managed Care – PPO | Admitting: Physical Therapy

## 2021-01-22 ENCOUNTER — Other Ambulatory Visit: Payer: Self-pay

## 2021-01-22 DIAGNOSIS — M79601 Pain in right arm: Secondary | ICD-10-CM | POA: Diagnosis not present

## 2021-01-22 DIAGNOSIS — M2560 Stiffness of unspecified joint, not elsewhere classified: Secondary | ICD-10-CM | POA: Diagnosis not present

## 2021-01-22 DIAGNOSIS — S96919A Strain of unspecified muscle and tendon at ankle and foot level, unspecified foot, initial encounter: Secondary | ICD-10-CM | POA: Diagnosis not present

## 2021-01-22 DIAGNOSIS — R29898 Other symptoms and signs involving the musculoskeletal system: Secondary | ICD-10-CM

## 2021-01-22 DIAGNOSIS — M6281 Muscle weakness (generalized): Secondary | ICD-10-CM | POA: Diagnosis not present

## 2021-01-22 DIAGNOSIS — M25571 Pain in right ankle and joints of right foot: Secondary | ICD-10-CM | POA: Diagnosis not present

## 2021-01-22 DIAGNOSIS — S93409A Sprain of unspecified ligament of unspecified ankle, initial encounter: Secondary | ICD-10-CM | POA: Diagnosis not present

## 2021-01-22 NOTE — Therapy (Signed)
[facility] [address] [place], [place], [address] Phone: [phone]   Fax:  [phone]  Physical Therapy Treatment  Patient Details  Name: Amanda Humphrey MRN: [id number] Date of Birth: 1965/06/14 Referring Provider (PT): Orbie Pyo, [place]   Encounter Date: 01/22/2021   PT End of Session - 01/22/21 0909     Visit Number 34    Number of Visits 35    Date for PT Re-Evaluation 01/28/21    Authorization - Visit Number 4    Authorization - Number of Visits 10    PT Start Time 0905    PT Stop Time 0946    PT Time Calculation (min) 41 min    Activity Tolerance Patient tolerated treatment well;Patient limited by pain    Behavior During Therapy [facility] for tasks assessed/performed             Past Medical History:  Diagnosis Date   Allergy    Chronic kidney disease    stones   Fibrocystic breast disease 2013   Meniere disease    Migraines     Past Surgical History:  Procedure Laterality Date   ABDOMINAL HYSTERECTOMY  2011   laprascopic supracervical, [place]   APPENDECTOMY  1999   BREAST BIOPSY Right Nov 2012    fibrocystic disease, [place]   CESAREAN SECTION     4 total   CHOLECYSTECTOMY  2011   [place]    There were no vitals filed for this visit.   Subjective Assessment - 01/22/21 0907     Subjective Pt. had a good Christmas weekend.  No new complaints.  Pt. reports 5-6/10 L UT discomfort.    Pertinent History MRI results: Rupture of R ant talofibular ligament. History of severe neck pain that causes radiation to the R UE. Cervical Srugery C6-T1(early 2021). History of bilat plantar fasciitis and heel spurs. Pt's spouse has health complication resulting with increased home ADL reponsibilies. Pt enjoys visiting and walking on the beach, which she is currently unable to do at this time 2/2 ankle instability and pain.    Limitations Lifting;House hold activities    How long can you  sit comfortably? WNL    How long can you stand comfortably? WNL with brace, fear of movement without brace.    How long can you walk comfortably? WNL with brace, fear of movement without brace.    Diagnostic tests MRI    Patient Stated Goals Decrease muscle spasms, return to work    Currently in Pain? Yes    Pain Score 6     Pain Location Neck    Pain Orientation Left    Pain Onset More than a month ago    Pain Onset More than a month ago                    Manual Therapy (38 mins) :    Standing B shoulder/cervical AROM all planes with mirror feedback 10x each.    Seated thoracic/lumbar rotn. With OP 3x each.    Supine and prone STM to L and R upper trap, levator scapulae, rhomboid, teres minor, and infraspinatus.      Supine manual cervical traction/ suboccipital release technique 5x with static holds.     Prone mobs to upper/low thoracic spine (unilateral/ central grade II-III PA mobs)- 2x20 sec. Each.           PT Long Term Goals - 01/08/21 1109  PT LONG TERM GOAL #1   Title Pt. will improve FOTO score to >62 to improve independence with ADLs *foot*    Baseline 42 9/6: 57 10/20:65    Time 6    Period Weeks    Status Achieved    Target Date 11/15/20      PT LONG TERM GOAL #2   Title Pt will improve R ankle MMT to qual to L to faciliate increased stability for return to walks on the beach.    Baseline R/L: 3/5 Ankle dorsiflexion    5/25 bilat Sl calf raise Ankle plantarflexion  *pain with all MMT testing of the ankle/foot*  3/5 ankle inversion   3/5 ankle eversion 9/6: R/L: 4+/5 Ankle dorsiflexion    8/25 bilat Sl calf raise Ankle plantarflexion  *pain with all MMT testing of the ankle/foot*  4/5 ankle inversion   4/5 ankle eversion. 10/20: bilat Sl calf raise Ankle plantarflexion 8/25  *pain with all MMT testing of the ankle/foot*  4+/5 ankle inversion   4+/5 ankle eversion, 4+/5 ankle DF.    Time 6    Period Weeks    Status Achieved    Target Date  12/31/20      PT LONG TERM GOAL #3   Title Pt will be able to complete 20 steps with R ankle pain >3/10 to facilitate greater mobility within the home.    Baseline 7-8/10 NPS 9/6: 2/10 at the lowest. 8 at the worse. Stair climbing is very difficult 10/20: Pt was able to complete >20 steps with 0/10.    Time 6    Period Weeks    Status Achieved    Target Date 11/15/20      PT LONG TERM GOAL #4   Title Pt will be able to complete work/home mobility tasks with laced ankle braced doff >50% for return to work specific foot wear.    Baseline Pt current needs to wear high cushion tennis shoes to work, brace donned 100% 9/6: laced brace doffed during ADLs the last 4 days. PT will wear brace during long work days.10/20: Pt is currently not using laced ankle braced and able to complete all work related tasks.    Time 6    Period Weeks    Status Achieved    Target Date 11/15/20      PT LONG TERM GOAL #5   Title Pt will improve FOTO score to predicted improvement value of 62 in regards to cervical impairments.    Baseline 52 10/20:47.  12/5: 63    Time 4    Period Weeks    Status Achieved    Target Date 12/31/20      PT LONG TERM GOAL #6   Title Pt will improve worse cervical pain by atleast 3 NPS points to allow increased tolerance during work and home related ADLs.    Baseline NPS: 8/10 current, 7/10 best, 10/10 Worse. 10/20: Worse pain 7/10    Time 4    Period Weeks    Status Partially Met    Target Date 01/28/21      PT LONG TERM GOAL #7   Title Pt will improve bilat UE MMT by atleast 1/2 grade to facilitate greater functional abilities with ADLs related to caregiving.    Baseline UE MMT R/L:   Shoulder Flexion: 4/4-  Shoulder abduction: 4/4-  Shoulder ER: 4/4  Shoulder IR: 4/4  Elbow ext: 5/5   Eblow flex: 5/5 10/20: UE MMT R/L:   Shoulder Flexion:  4/4  Shoulder abduction: 4/4 Shoulder ER: 4/4  Shoulder IR: 4/4  Elbow ext: 5/5   Eblow flex: 5/5    Time 6    Period Weeks    Status  Partially Met    Target Date 01/28/21      PT LONG TERM GOAL #8   Title Pt will improve functional R hand strength equal to L to promote return of hand useage during grooming and feeding.    Baseline Grip R/L: 76.1 / 33.9    Lateral prehension grip R/L: 14 / 4 10/20: Grip R/L: 62.0 / 41.4    Lateral prehension grip R/L: 13 / 6    Time 6    Period Weeks    Status Partially Met    Target Date 01/28/21                   Plan - 01/22/21 1012     Clinical Impression Statement Pt. has massage scheduled for this afternoon so PT tx. focused on cervical/pec. stretches and trigger point release techniques to cervical/thoracic musculature.  No TDN today and moderate mid-thoracic hypomobility noted during prone mobs/ seated thoracic rotn.  Pt. will continue to current HEP and PT will reassess goals/ discuss POC next tx. session.    Personal Factors and Comorbidities Comorbidity 2;Profession;Past/Current Experience    Comorbidities Hypertension, Obesity    Examination-Activity Limitations Squat;Locomotion Level    Examination-Participation Restrictions Occupation;Cleaning;Laundry    Stability/Clinical Decision Making Evolving/Moderate complexity    Clinical Decision Making Moderate    Rehab Potential Good    PT Frequency 2x / week    PT Duration 4 weeks    PT Treatment/Interventions ADLs/Self Care Home Management;Aquatic Therapy;Biofeedback;Cryotherapy;Electrical Stimulation;Moist Heat;Ultrasound;Functional mobility training;Therapeutic activities;Therapeutic exercise;Balance training;Neuromuscular re-education;Patient/family education;Manual techniques;Scar mobilization;Passive range of motion;Dry needling;Energy conservation;Taping;Gait training;Stair training;DME Instruction;Spinal Manipulations;Joint Manipulations    PT Next Visit Plan Probable discharge next tx. session.  Reassess need for TDN next visit.    PT Home Exercise Plan [id number].  New HEP: [id number]    Consulted and Agree with  Plan of Care Patient             Patient will benefit from skilled therapeutic intervention in order to improve the following deficits and impairments:  Decreased mobility, Decreased endurance, Hypomobility, Decreased range of motion, Decreased scar mobility, Improper body mechanics, Postural dysfunction, Impaired flexibility, Decreased strength, Decreased activity tolerance, Abnormal gait, Decreased balance, Difficulty walking, Increased edema, Impaired perceived functional ability  Visit Diagnosis: Joint stiffness of spine  Ankle weakness  Pain in right arm  Muscle weakness (generalized)  Pain in right ankle and joints of right foot     Problem List Patient Active Problem List   Diagnosis Date Noted   H/O of hemilaminectomy 02/15/2019   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[place]) 11/17/2018   Fatigue 09/23/2017   History of pneumonia 04/30/2016   Grief reaction 04/30/2016   Vitamin D deficiency 02/22/2015   Hyperlipidemia 01/01/2015   Visit for preventive health examination 12/04/2013   Lower extremity edema 09/02/2013   Essential hypertension 09/02/2013   History of cardiomyopathy 09/02/2013   SVT (supraventricular tachycardia) ([place]) 09/02/2013   Palpitations 09/02/2013   Contrast media allergy 03/10/2013   Fibrocystic breast disease    Morbid obesity ([place]) 02/05/2012   Meniere disease    Pura Spice, PT, DPT # [id number] 01/22/2021, 10:16 AM  [facility] [address] [place], [place], [address] Phone: [phone]   Fax:  [phone]  Name:  EMMALY LEECH MRN: [id number] Date of Birth: March 12, 1965

## 2021-01-24 ENCOUNTER — Ambulatory Visit: Payer: BC Managed Care – PPO | Admitting: Physical Therapy

## 2021-01-24 ENCOUNTER — Encounter: Payer: Self-pay | Admitting: Physical Therapy

## 2021-01-24 ENCOUNTER — Other Ambulatory Visit: Payer: Self-pay

## 2021-01-24 DIAGNOSIS — M79601 Pain in right arm: Secondary | ICD-10-CM

## 2021-01-24 DIAGNOSIS — R29898 Other symptoms and signs involving the musculoskeletal system: Secondary | ICD-10-CM | POA: Diagnosis not present

## 2021-01-24 DIAGNOSIS — M2560 Stiffness of unspecified joint, not elsewhere classified: Secondary | ICD-10-CM

## 2021-01-24 DIAGNOSIS — M6281 Muscle weakness (generalized): Secondary | ICD-10-CM

## 2021-01-24 DIAGNOSIS — M25571 Pain in right ankle and joints of right foot: Secondary | ICD-10-CM

## 2021-01-24 DIAGNOSIS — S96919A Strain of unspecified muscle and tendon at ankle and foot level, unspecified foot, initial encounter: Secondary | ICD-10-CM | POA: Diagnosis not present

## 2021-01-24 DIAGNOSIS — S93409A Sprain of unspecified ligament of unspecified ankle, initial encounter: Secondary | ICD-10-CM | POA: Diagnosis not present

## 2021-01-24 NOTE — Therapy (Signed)
[facility] [address] [place], [place], [address] Phone: [phone]   Fax:  [phone]  Physical Therapy Treatment  Patient Details  Name: Amanda Humphrey MRN: [id number] Date of Birth: [birth year] Referring Provider (PT): Orbie Pyo, [place]   Encounter Date: 01/24/2021   PT End of Session - 01/24/21 0739     Visit Number 35    Number of Visits 35    Date for PT Re-Evaluation 01/28/21    Authorization - Visit Number 5    Authorization - Number of Visits 10    PT Start Time 2952    PT Stop Time 0817    PT Time Calculation (min) 45 min    Activity Tolerance Patient tolerated treatment well;Patient limited by pain    Behavior During Therapy [facility] for tasks assessed/performed             Past Medical History:  Diagnosis Date   Allergy    Chronic kidney disease    stones   Fibrocystic breast disease 2013   Meniere disease    Migraines     Past Surgical History:  Procedure Laterality Date   ABDOMINAL HYSTERECTOMY  2011   laprascopic supracervical, [place]   APPENDECTOMY  1999   BREAST BIOPSY Right Nov 2012    fibrocystic disease, [place]   CESAREAN SECTION     4 total   CHOLECYSTECTOMY  2011   [place]    There were no vitals filed for this visit.   Subjective Assessment - 01/24/21 0736     Subjective Pt. had a nice massage since last tx.  Pt. reports no MD f/u soon.    Pertinent History MRI results: Rupture of R ant talofibular ligament. History of severe neck pain that causes radiation to the R UE. Cervical Srugery C6-T1(early 2021). History of bilat plantar fasciitis and heel spurs. Pt's spouse has health complication resulting with increased home ADL reponsibilies. Pt enjoys visiting and walking on the beach, which she is currently unable to do at this time 2/2 ankle instability and pain.    Limitations Lifting;House hold activities    How long can you sit comfortably? WNL     How long can you stand comfortably? WNL with brace, fear of movement without brace.    How long can you walk comfortably? WNL with brace, fear of movement without brace.    Diagnostic tests MRI    Patient Stated Goals Decrease muscle spasms, return to work    Currently in Pain? Yes    Pain Score 5     Pain Location Neck    Pain Orientation Left;Right    Pain Onset More than a month ago    Pain Onset More than a month ago               Manual Therapy (39 mins) :   Prone STM to L and R upper trap, levator scapulae, rhomboid, teres minor, and infraspinatus.      Prone manual suboccipital release technique 5x with static holds.     Prone mobs to upper/low thoracic spine (unilateral/ central grade II-III PA mobs)- 2x20 sec. Each.    Trigger Point Dry Needling (TDN)   Education performed with patient regarding potential benefit of TDN. Reviewed precautions and risks with patient. Reviewed special precautions/risks over lung fields which include pneumothorax. Reviewed signs and symptoms of pneumothorax and advised pt to go to ER immediately if these symptoms develop advise them of  dry needling treatment. Pt provided verbal consent to treatment. TDN performed to bilateral suboccipital region/ mid-cervical paraspinals with 4, 0.30 x 60 L-type single needle placements on each side with 2 local twitch response (LTR). Pistoning technique utilized. Improved pain-free motion following intervention.  Increase discomfort reported with suboccipital region (R>L).  Applied Biofreeze to upper back/ neck region in prone position.               PT Long Term Goals - 01/08/21 1109       PT LONG TERM GOAL #1   Title Pt. will improve FOTO score to >62 to improve independence with ADLs *foot*    Baseline 42 9/6: 57 10/20:65    Time 6    Period Weeks    Status Achieved    Target Date 11/15/20      PT LONG TERM GOAL #2   Title Pt will improve R ankle MMT to qual to L to faciliate increased  stability for return to walks on the beach.    Baseline R/L: 3/5 Ankle dorsiflexion    5/25 bilat Sl calf raise Ankle plantarflexion  *pain with all MMT testing of the ankle/foot*  3/5 ankle inversion   3/5 ankle eversion 9/6: R/L: 4+/5 Ankle dorsiflexion    8/25 bilat Sl calf raise Ankle plantarflexion  *pain with all MMT testing of the ankle/foot*  4/5 ankle inversion   4/5 ankle eversion. 10/20: bilat Sl calf raise Ankle plantarflexion 8/25  *pain with all MMT testing of the ankle/foot*  4+/5 ankle inversion   4+/5 ankle eversion, 4+/5 ankle DF.    Time 6    Period Weeks    Status Achieved    Target Date 12/31/20      PT LONG TERM GOAL #3   Title Pt will be able to complete 20 steps with R ankle pain >3/10 to facilitate greater mobility within the home.    Baseline 7-8/10 NPS 9/6: 2/10 at the lowest. 8 at the worse. Stair climbing is very difficult 10/20: Pt was able to complete >20 steps with 0/10.    Time 6    Period Weeks    Status Achieved    Target Date 11/15/20      PT LONG TERM GOAL #4   Title Pt will be able to complete work/home mobility tasks with laced ankle braced doff >50% for return to work specific foot wear.    Baseline Pt current needs to wear high cushion tennis shoes to work, brace donned 100% 9/6: laced brace doffed during ADLs the last 4 days. PT will wear brace during long work days.10/20: Pt is currently not using laced ankle braced and able to complete all work related tasks.    Time 6    Period Weeks    Status Achieved    Target Date 11/15/20      PT LONG TERM GOAL #5   Title Pt will improve FOTO score to predicted improvement value of 62 in regards to cervical impairments.    Baseline 52 10/20:47.  12/5: 63    Time 4    Period Weeks    Status Achieved    Target Date 12/31/20      PT LONG TERM GOAL #6   Title Pt will improve worse cervical pain by atleast 3 NPS points to allow increased tolerance during work and home related ADLs.    Baseline NPS: 8/10  current, 7/10 best, 10/10 Worse. 10/20: Worse pain 7/10    Time 4  Period Weeks    Status Partially Met    Target Date 01/28/21      PT LONG TERM GOAL #7   Title Pt will improve bilat UE MMT by atleast 1/2 grade to facilitate greater functional abilities with ADLs related to caregiving.    Baseline UE MMT R/L:   Shoulder Flexion: 4/4-  Shoulder abduction: 4/4-  Shoulder ER: 4/4  Shoulder IR: 4/4  Elbow ext: 5/5   Eblow flex: 5/5 10/20: UE MMT R/L:   Shoulder Flexion: 4/4  Shoulder abduction: 4/4 Shoulder ER: 4/4  Shoulder IR: 4/4  Elbow ext: 5/5   Eblow flex: 5/5    Time 6    Period Weeks    Status Partially Met    Target Date 01/28/21      PT LONG TERM GOAL #8   Title Pt will improve functional R hand strength equal to L to promote return of hand useage during grooming and feeding.    Baseline Grip R/L: 76.1 / 33.9    Lateral prehension grip R/L: 14 / 4 10/20: Grip R/L: 62.0 / 41.4    Lateral prehension grip R/L: 13 / 6    Time 6    Period Weeks    Status Partially Met    Target Date 01/28/21                   Plan - 01/24/21 1001     Clinical Impression Statement PT discussed POC with pt and discussed decreasing tx. frequency to biweekly with focus on TDN/ manual tx.  Pt. instructed to contact PT if any regression in symptoms during the decrease in tx. frequency.  Pt. presents with good cervical AROM and has tenderness at subocciput (bilateral) and R UT region.  Good tx. tolerance to manual trigger point release/ stretches and TDN.  PT will update goals and send MD a progress note/ recertication next tx. session.    Personal Factors and Comorbidities Comorbidity 2;Profession;Past/Current Experience    Comorbidities Hypertension, Obesity    Examination-Activity Limitations Squat;Locomotion Level    Examination-Participation Restrictions Occupation;Cleaning;Laundry    Stability/Clinical Decision Making Evolving/Moderate complexity    Clinical Decision Making Moderate     Rehab Potential Good    PT Frequency 2x / week    PT Duration 4 weeks    PT Treatment/Interventions ADLs/Self Care Home Management;Aquatic Therapy;Biofeedback;Cryotherapy;Electrical Stimulation;Moist Heat;Ultrasound;Functional mobility training;Therapeutic activities;Therapeutic exercise;Balance training;Neuromuscular re-education;Patient/family education;Manual techniques;Scar mobilization;Passive range of motion;Dry needling;Energy conservation;Taping;Gait training;Stair training;DME Instruction;Spinal Manipulations;Joint Manipulations    PT Next Visit Plan Decrease frequency to 1x biweekly with greater focus on independent HEP.  PT discussed the benefits of a consistent massage.    PT Home Exercise Plan [id number].  New HEP: [id number]    Consulted and Agree with Plan of Care Patient             Patient will benefit from skilled therapeutic intervention in order to improve the following deficits and impairments:  Decreased mobility, Decreased endurance, Hypomobility, Decreased range of motion, Decreased scar mobility, Improper body mechanics, Postural dysfunction, Impaired flexibility, Decreased strength, Decreased activity tolerance, Abnormal gait, Decreased balance, Difficulty walking, Increased edema, Impaired perceived functional ability  Visit Diagnosis: Joint stiffness of spine  Ankle weakness  Pain in right arm  Muscle weakness (generalized)  Pain in right ankle and joints of right foot  Sprain and strain of ankle     Problem List Patient Active Problem List   Diagnosis Date Noted   H/O of hemilaminectomy 02/15/2019   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[place]) 11/17/2018   Fatigue 09/23/2017   History of pneumonia 04/30/2016   Grief reaction 04/30/2016   Vitamin D deficiency 02/22/2015   Hyperlipidemia 01/01/2015   Visit for preventive health examination 12/04/2013   Lower extremity edema 09/02/2013   Essential hypertension 09/02/2013   History of cardiomyopathy  09/02/2013   SVT (supraventricular tachycardia) ([place]) 09/02/2013   Palpitations 09/02/2013   Contrast media allergy 03/10/2013   Fibrocystic breast disease    Morbid obesity ([place]) 02/05/2012   Meniere disease    Pura Spice, PT, DPT # [id number] 01/24/2021, 10:06 AM  [facility] [address] [place], [place], [address] Phone: [phone]   Fax:  [phone]  Name: ALLIE OUSLEY MRN: [id number] Date of Birth: 07-03-1965

## 2021-01-30 ENCOUNTER — Telehealth: Payer: BC Managed Care – PPO | Admitting: Nurse Practitioner

## 2021-01-30 DIAGNOSIS — J0100 Acute maxillary sinusitis, unspecified: Secondary | ICD-10-CM | POA: Diagnosis not present

## 2021-01-30 DIAGNOSIS — H9201 Otalgia, right ear: Secondary | ICD-10-CM | POA: Diagnosis not present

## 2021-01-30 MED ORDER — DOXYCYCLINE HYCLATE 100 MG PO TABS: 100.0000 mg | ORAL_TABLET | Freq: Two times a day (BID) | ORAL | 0 refills | Status: AC

## 2021-01-30 NOTE — Progress Notes (Signed)
Virtual Visit Consent   Amanda Humphrey, you are scheduled for a virtual visit with a [facility] provider today.     Just as with appointments in the office, your consent must be obtained to participate.  Your consent will be active for this visit and any virtual visit you may have with one of our providers in the next 365 days.     If you have a MyChart account, a copy of this consent can be sent to you electronically.  All virtual visits are billed to your insurance company just like a traditional visit in the office.    As this is a virtual visit, video technology does not allow for your provider to perform a traditional examination.  This may limit your provider's ability to fully assess your condition.  If your provider identifies any concerns that need to be evaluated in person or the need to arrange testing (such as labs, EKG, etc.), we will make arrangements to do so.     Although advances in technology are sophisticated, we cannot ensure that it will always work on either your end or our end.  If the connection with a video visit is poor, the visit may have to be switched to a telephone visit.  With either a video or telephone visit, we are not always able to ensure that we have a secure connection.     I need to obtain your verbal consent now.   Are you willing to proceed with your visit today?    ALVIRA MUHA has provided verbal consent on 01/30/2021 for a virtual visit (video or telephone).   Apolonio Schneiders, FNP   Date: 01/30/2021 7:28 PM   Virtual Visit via Video Note   I, Apolonio Schneiders, connected with  KEARY FASSLER  ([id number], 05-24-1965) on 01/30/21 at  7:30 PM EST by a video-enabled telemedicine application and verified that I am speaking with the correct person using two identifiers.  Location: Patient: Virtual Visit Location Patient: Home Provider: Virtual Visit Location Provider: Home Office   I discussed the limitations of evaluation and management by telemedicine and  the availability of in person appointments. The patient expressed understanding and agreed to proceed.    History of Present Illness: Amanda Humphrey is a 56 y.o. who identifies as a female who was assigned female at birth, and is being seen today with complaints of nasal congestion, sore throat that radiates to her ear.   She has been taking sudafed for relief.   She received her COVID booster 7 days ago and has been run down since that time. She has had sinus pressure and today she developed ear pain as well.   Her right sinus and ear are what are bothering her most tonight.   She denies a fever.   She did take a COVID test since feeling ill and it was negative.   History of SVT  Problems:  Patient Active Problem List   Diagnosis Date Noted   H/O of hemilaminectomy 02/15/2019   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[facility]) 11/17/2018   Fatigue 09/23/2017   History of pneumonia 04/30/2016   Grief reaction 04/30/2016   Vitamin D deficiency 02/22/2015   Hyperlipidemia 01/01/2015   Visit for preventive health examination 12/04/2013   Lower extremity edema 09/02/2013   Essential hypertension 09/02/2013   History of cardiomyopathy 09/02/2013   SVT (supraventricular tachycardia) ([facility]) 09/02/2013   Palpitations 09/02/2013   Contrast media allergy 03/10/2013   Fibrocystic breast disease  Morbid obesity ([place]) 02/05/2012   Meniere disease     Allergies:  Allergies  Allergen Reactions   Bee Venom Anaphylaxis   Codeine     Rash, Throat swelling,    Hydrocodone Anaphylaxis   Tramadol     Itching, facial swelling   Oxycodone Rash   Medications:  Current Outpatient Medications:    amphetamine-dextroamphetamine (ADDERALL) 20 MG tablet, Take 20 mg by mouth every evening. , Disp: , Rfl:    benzonatate (TESSALON) 100 MG capsule, Take 1 capsule (100 mg total) by mouth every 8 (eight) hours., Disp: 21 capsule, Rfl: 0   carvedilol (COREG) 6.25 MG tablet, TAKE 1 TABLET TWICE A DAY, Disp:  180 tablet, Rfl: 3   cyclobenzaprine (FLEXERIL) 5 MG tablet, Take 5 mg by mouth at bedtime., Disp: , Rfl:    ezetimibe (ZETIA) 10 MG tablet, Take 1 tablet (10 mg total) by mouth daily., Disp: 90 tablet, Rfl: 3   furosemide (LASIX) 20 MG tablet, Take 1 tablet (20 mg total) by mouth daily as needed., Disp: 90 tablet, Rfl: 3   hydrochlorothiazide (HYDRODIURIL) 25 MG tablet, Take 1 tablet (25 mg total) by mouth daily., Disp: 90 tablet, Rfl: 3   methocarbamol (ROBAXIN) 500 MG tablet, TAKE 1 TABLET TWICE A DAY, Disp: 90 tablet, Rfl: 7   omeprazole (PRILOSEC) 40 MG capsule, TAKE 1 CAPSULE DAILY, Disp: 90 capsule, Rfl: 3   ondansetron (ZOFRAN ODT) 4 MG disintegrating tablet, Take 1 tablet (4 mg total) by mouth every 8 (eight) hours as needed for nausea or vomiting., Disp: 20 tablet, Rfl: 0   predniSONE (STERAPRED UNI-PAK 21 TAB) 10 MG (21) TBPK tablet, Take by mouth daily. Take 6 tabs by mouth daily  for 2 days, then 5 tabs for 2 days, then 4 tabs for 2 days, then 3 tabs for 2 days, 2 tabs for 2 days, then 1 tab by mouth daily for 2 days, Disp: 42 tablet, Rfl: 0   rosuvastatin (CRESTOR) 5 MG tablet, Take 1 tablet (5 mg total) by mouth daily., Disp: 90 tablet, Rfl: 3  Observations/Objective: Patient is well-developed, well-nourished in no acute distress.  Resting comfortably at home.  Head is normocephalic, atraumatic.  No labored breathing.  Speech is clear and coherent with logical content.  Patient is alert and oriented at baseline.    Assessment and Plan: 1. Otalgia of right ear Ibuprofen for pain relief up to 600mg  every 6 hours as needed take with food Heat to ear as needed  Restart Flonase   2. Acute non-recurrent maxillary sinusitis  - doxycycline (VIBRA-TABS) 100 MG tablet; Take 1 tablet (100 mg total) by mouth 2 (two) times daily for 10 days. Take with food  Dispense: 20 tablet; Refill: 0     Follow Up Instructions: I discussed the assessment and treatment plan with the patient. The  patient was provided an opportunity to ask questions and all were answered. The patient agreed with the plan and demonstrated an understanding of the instructions.  A copy of instructions were sent to the patient via MyChart unless otherwise noted below.     The patient was advised to call back or seek an in-person evaluation if the symptoms worsen or if the condition fails to improve as anticipated.  Time:  I spent 10 minutes with the patient via telehealth technology discussing the above problems/concerns.    Apolonio Schneiders, FNP

## 2021-01-31 ENCOUNTER — Ambulatory Visit: Payer: BC Managed Care – PPO

## 2021-02-07 ENCOUNTER — Ambulatory Visit: Payer: BC Managed Care – PPO | Attending: Podiatry | Admitting: Physical Therapy

## 2021-02-07 ENCOUNTER — Other Ambulatory Visit: Payer: Self-pay

## 2021-02-07 DIAGNOSIS — M2560 Stiffness of unspecified joint, not elsewhere classified: Secondary | ICD-10-CM | POA: Diagnosis not present

## 2021-02-07 DIAGNOSIS — R29898 Other symptoms and signs involving the musculoskeletal system: Secondary | ICD-10-CM | POA: Insufficient documentation

## 2021-02-07 DIAGNOSIS — M79601 Pain in right arm: Secondary | ICD-10-CM | POA: Insufficient documentation

## 2021-02-07 DIAGNOSIS — M6281 Muscle weakness (generalized): Secondary | ICD-10-CM | POA: Insufficient documentation

## 2021-02-07 DIAGNOSIS — M25571 Pain in right ankle and joints of right foot: Secondary | ICD-10-CM | POA: Diagnosis not present

## 2021-02-09 NOTE — Therapy (Signed)
[facility] [address] [place], [place], [address] Phone: [phone]   Fax:  [phone]  Physical Therapy Treatment  Patient Details  Name: Amanda Humphrey MRN: [id number] Date of Birth: 1965-12-16 Referring Provider (PT): Orbie Pyo, [place]   Encounter Date: 02/07/2021   PT End of Session - 02/09/21 0327     Visit Number 36    Number of Visits 44    Date for PT Re-Evaluation 04/04/21    Authorization - Visit Number 6    Authorization - Number of Visits 10    PT Start Time 2482    PT Stop Time 1034    PT Time Calculation (min) 44 min    Activity Tolerance Patient tolerated treatment well;Patient limited by pain    Behavior During Therapy [facility] for tasks assessed/performed             Past Medical History:  Diagnosis Date   Allergy    Chronic kidney disease    stones   Fibrocystic breast disease 2013   Meniere disease    Migraines     Past Surgical History:  Procedure Laterality Date   ABDOMINAL HYSTERECTOMY  2011   laprascopic supracervical, [place]   APPENDECTOMY  1999   BREAST BIOPSY Right Nov 2012    fibrocystic disease, [place]   CESAREAN SECTION     4 total   CHOLECYSTECTOMY  2011   [place]    There were no vitals filed for this visit.   Subjective Assessment - 02/09/21 0320     Subjective Pt. enters PT with no new issues.  Pt. has remained active with daily tasks/ work since last PT tx. session.    Pertinent History MRI results: Rupture of R ant talofibular ligament. History of severe neck pain that causes radiation to the R UE. Cervical Srugery C6-T1(early 2021). History of bilat plantar fasciitis and heel spurs. Pt's spouse has health complication resulting with increased home ADL reponsibilies. Pt enjoys visiting and walking on the beach, which she is currently unable to do at this time 2/2 ankle instability and pain.    Limitations Lifting;House hold activities     How long can you sit comfortably? WNL    How long can you stand comfortably? WNL with brace, fear of movement without brace.    How long can you walk comfortably? WNL with brace, fear of movement without brace.    Diagnostic tests MRI    Patient Stated Goals Decrease muscle spasms, return to work    Currently in Pain? Yes    Pain Score 5     Pain Location Neck    Pain Orientation Left;Right    Pain Onset More than a month ago    Pain Onset More than a month ago                [facility] PT Assessment - 02/09/21 0001       Assessment   Medical Diagnosis L cervical radiculopathy/ myofascial pain    Referring Provider (PT) Orbie Pyo, PA-C      [place] residence      Prior Function   Level of Independence Independent               Manual Therapy:   Supine L/R UT, levator, and rotn. Stretches.  STM to L/T UT and posterior deltoid.    Prone STM to L and R upper trap, levator scapulae,  rhomboid, teres minor, and infraspinatus.      Prone manual suboccipital release technique 5x with static holds.     Prone mobs to upper/low thoracic spine (unilateral/ central grade II-III PA mobs)- 2x20 sec. Each.      Trigger Point Dry Needling (TDN)   Education performed with patient regarding potential benefit of TDN. Reviewed precautions and risks with patient. Reviewed special precautions/risks over lung fields which include pneumothorax. Reviewed signs and symptoms of pneumothorax and advised pt to go to ER immediately if these symptoms develop advise them of dry needling treatment. Pt provided verbal consent to treatment. TDN performed to bilateral suboccipital region/ mid-cervical paraspinals with 4, 0.30 x 60 L-type single needle placements on each side with 2 local twitch response (LTR). Pistoning technique utilized. Improved pain-free motion following intervention.  Increase discomfort reported with suboccipital region (R>L).  Applied  Biofreeze to upper back/ neck region in prone position.         PT Long Term Goals - 02/09/21 1134       PT LONG TERM GOAL #1   Title Pt. will improve FOTO score to >62 to improve independence with ADLs *foot*    Baseline 42 9/6: 57 10/20:65    Time 6    Period Weeks    Status Achieved    Target Date 11/15/20      PT LONG TERM GOAL #2   Title Pt will improve R ankle MMT to qual to L to faciliate increased stability for return to walks on the beach.    Baseline R/L: 3/5 Ankle dorsiflexion    5/25 bilat Sl calf raise Ankle plantarflexion  *pain with all MMT testing of the ankle/foot*  3/5 ankle inversion   3/5 ankle eversion 9/6: R/L: 4+/5 Ankle dorsiflexion    8/25 bilat Sl calf raise Ankle plantarflexion  *pain with all MMT testing of the ankle/foot*  4/5 ankle inversion   4/5 ankle eversion. 10/20: bilat Sl calf raise Ankle plantarflexion 8/25  *pain with all MMT testing of the ankle/foot*  4+/5 ankle inversion   4+/5 ankle eversion, 4+/5 ankle DF.    Time 6    Period Weeks    Status Achieved    Target Date 12/31/20      PT LONG TERM GOAL #3   Title Pt will be able to complete 20 steps with R ankle pain >3/10 to facilitate greater mobility within the home.    Baseline 7-8/10 NPS 9/6: 2/10 at the lowest. 8 at the worse. Stair climbing is very difficult 10/20: Pt was able to complete >20 steps with 0/10.    Time 6    Period Weeks    Status Achieved    Target Date 11/15/20      PT LONG TERM GOAL #4   Title Pt will be able to complete work/home mobility tasks with laced ankle braced doff >50% for return to work specific foot wear.    Baseline Pt current needs to wear high cushion tennis shoes to work, brace donned 100% 9/6: laced brace doffed during ADLs the last 4 days. PT will wear brace during long work days.10/20: Pt is currently not using laced ankle braced and able to complete all work related tasks.    Time 6    Period Weeks    Status Achieved    Target Date 11/15/20       PT LONG TERM GOAL #5   Title Pt will improve FOTO score to predicted improvement value of 62 in  regards to cervical impairments.    Baseline 52 10/20:47.  12/5: 63    Time 4    Period Weeks    Status Achieved    Target Date 12/31/20      PT LONG TERM GOAL #6   Title Pt will improve worse cervical pain by atleast 3 NPS points to allow increased tolerance during work and home related ADLs.    Baseline NPS: 8/10 current, 7/10 best, 10/10 Worse. 10/20: Worse pain 7/10    Time 8    Period Weeks    Status Partially Met    Target Date 04/04/21      PT LONG TERM GOAL #7   Title Pt will improve bilat UE MMT by atleast 1/2 grade to facilitate greater functional abilities with ADLs related to caregiving.    Baseline UE MMT R/L:   Shoulder Flexion: 4/4-  Shoulder abduction: 4/4-  Shoulder ER: 4/4  Shoulder IR: 4/4  Elbow ext: 5/5   Eblow flex: 5/5 10/20: UE MMT R/L:   Shoulder Flexion: 4/4  Shoulder abduction: 4/4 Shoulder ER: 4/4  Shoulder IR: 4/4  Elbow ext: 5/5   Eblow flex: 5/5    Time 8    Period Weeks    Status Partially Met    Target Date 04/04/21      PT LONG TERM GOAL #8   Title Pt will improve functional R hand strength equal to L to promote return of hand useage during grooming and feeding.    Baseline Grip R/L: 76.1 / 33.9    Lateral prehension grip R/L: 14 / 4 10/20: Grip R/L: 62.0 / 41.4    Lateral prehension grip R/L: 13 / 6.  1/12:  R 69#/ L 49.8#    Time 8    Period Weeks    Status Partially Met    Target Date 04/04/21                   Plan - 02/09/21 1129     Clinical Impression Statement Pt. presents with good cervical AROM (all planes) but tenderness noted with palpation over B suboccipital/ UT region during manual tx. 2 small trigger points noted in B UT today but stiffness/ discomfort in suboccipital region with palpation.  Pt. tolerates TDN to base of suboccipital region with 1 muscle fasciculation noted at R suboccipital trigger point.  See updated goals.   Pt. will continue with PT on a biweekly basis with focus on HEP/ regular massage therapy.  .    Personal Factors and Comorbidities Comorbidity 2;Profession;Past/Current Experience    Comorbidities Hypertension, Obesity    Examination-Activity Limitations Squat;Locomotion Level    Examination-Participation Restrictions Occupation;Cleaning;Laundry    Stability/Clinical Decision Making Evolving/Moderate complexity    Clinical Decision Making Moderate    Rehab Potential Good    PT Frequency Biweekly    PT Duration 8 weeks    PT Treatment/Interventions ADLs/Self Care Home Management;Aquatic Therapy;Biofeedback;Cryotherapy;Electrical Stimulation;Moist Heat;Ultrasound;Functional mobility training;Therapeutic activities;Therapeutic exercise;Balance training;Neuromuscular re-education;Patient/family education;Manual techniques;Scar mobilization;Passive range of motion;Dry needling;Energy conservation;Taping;Gait training;Stair training;DME Instruction;Spinal Manipulations;Joint Manipulations    PT Next Visit Plan Reassess posture ex./ scapular mobility.    PT Home Exercise Plan [id number].  New HEP: [id number]    Consulted and Agree with Plan of Care Patient             Patient will benefit from skilled therapeutic intervention in order to improve the following deficits and impairments:  Decreased mobility, Decreased endurance, Hypomobility, Decreased range of motion, Decreased scar mobility, Improper  body mechanics, Postural dysfunction, Impaired flexibility, Decreased strength, Decreased activity tolerance, Abnormal gait, Decreased balance, Difficulty walking, Increased edema, Impaired perceived functional ability  Visit Diagnosis: Joint stiffness of spine  Ankle weakness  Pain in right arm  Muscle weakness (generalized)  Pain in right ankle and joints of right foot     Problem List Patient Active Problem List   Diagnosis Date Noted   H/O of hemilaminectomy 02/15/2019   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[facility]) 11/17/2018   Fatigue 09/23/2017   History of pneumonia 04/30/2016   Grief reaction 04/30/2016   Vitamin D deficiency 02/22/2015   Hyperlipidemia 01/01/2015   Visit for preventive health examination 12/04/2013   Lower extremity edema 09/02/2013   Essential hypertension 09/02/2013   History of cardiomyopathy 09/02/2013   SVT (supraventricular tachycardia) ([place]) 09/02/2013   Palpitations 09/02/2013   Contrast media allergy 03/10/2013   Fibrocystic breast disease    Morbid obesity ([place]) 02/05/2012   Meniere disease    Pura Spice, PT, DPT # [phone] 02/09/2021, 11:38 AM  [facility] [address] [place], [place], [address] Phone: [phone]   Fax:  [phone]  Name: CHASIDY JANAK MRN: [id number] Date of Birth: 1965-03-02

## 2021-02-21 ENCOUNTER — Ambulatory Visit: Payer: BC Managed Care – PPO | Admitting: Physical Therapy

## 2021-02-21 ENCOUNTER — Other Ambulatory Visit: Payer: Self-pay

## 2021-02-21 DIAGNOSIS — M79601 Pain in right arm: Secondary | ICD-10-CM

## 2021-02-21 DIAGNOSIS — M25571 Pain in right ankle and joints of right foot: Secondary | ICD-10-CM | POA: Diagnosis not present

## 2021-02-21 DIAGNOSIS — M2560 Stiffness of unspecified joint, not elsewhere classified: Secondary | ICD-10-CM | POA: Diagnosis not present

## 2021-02-21 DIAGNOSIS — M6281 Muscle weakness (generalized): Secondary | ICD-10-CM

## 2021-02-21 DIAGNOSIS — R29898 Other symptoms and signs involving the musculoskeletal system: Secondary | ICD-10-CM | POA: Diagnosis not present

## 2021-02-23 NOTE — Therapy (Signed)
[place] [facility] [address] [place], [place], [address] Phone: [phone]   Fax:  [phone]  Physical Therapy Treatment  Patient Details  Name: Amanda Humphrey MRN: [id number] Date of Birth: 1965/04/24 Referring Provider (PT): Orbie Pyo, [place]   Encounter Date: 02/21/2021   PT End of Session - 02/23/21 1310     Visit Number 37    Number of Visits 44    Date for PT Re-Evaluation 04/04/21    Authorization - Visit Number 7    Authorization - Number of Visits 10    PT Start Time 0945    PT Stop Time 1033    PT Time Calculation (min) 48 min    Activity Tolerance Patient tolerated treatment well;Patient limited by pain    Behavior During Therapy [facility] for tasks assessed/performed             Past Medical History:  Diagnosis Date   Allergy    Chronic kidney disease    stones   Fibrocystic breast disease 2013   Meniere disease    Migraines     Past Surgical History:  Procedure Laterality Date   ABDOMINAL HYSTERECTOMY  2011   laprascopic supracervical, [place]   APPENDECTOMY  1999   BREAST BIOPSY Right Nov 2012    fibrocystic disease, [place]   CESAREAN SECTION     4 total   CHOLECYSTECTOMY  2011   [place]    There were no vitals filed for this visit.   Subjective Assessment - 02/23/21 1308     Subjective Pt. went to [place] last week for work and states excessive walking was difficult on ankle.    Pertinent History MRI results: Rupture of R ant talofibular ligament. History of severe neck pain that causes radiation to the R UE. Cervical Srugery C6-T1(early 2021). History of bilat plantar fasciitis and heel spurs. Pt's spouse has health complication resulting with increased home ADL reponsibilies. Pt enjoys visiting and walking on the beach, which she is currently unable to do at this time 2/2 ankle instability and pain.    Limitations Lifting;House hold activities    How long can you  sit comfortably? WNL    How long can you stand comfortably? WNL with brace, fear of movement without brace.    How long can you walk comfortably? WNL with brace, fear of movement without brace.    Diagnostic tests MRI    Patient Stated Goals Decrease muscle spasms, return to work    Currently in Pain? Yes    Pain Score 5     Pain Location Neck    Pain Orientation Right;Left    Pain Onset More than a month ago    Pain Onset More than a month ago              Manual Therapy:   Supine L/R UT, levator, and rotn. Stretches.  STM to L/T UT and posterior deltoid.    Seated thoracic rotn./ reassessment of cervical AROM.    Prone STM to L and R upper trap, levator scapulae, rhomboid, teres minor, and infraspinatus.      Prone manual suboccipital release technique 5x with static holds.       Trigger Point Dry Needling (TDN)   Education performed with patient regarding potential benefit of TDN. Reviewed precautions and risks with patient. Reviewed special precautions/risks over lung fields which include pneumothorax. Reviewed signs and symptoms of pneumothorax and advised pt to go to  ER immediately if these symptoms develop advise them of dry needling treatment. Pt provided verbal consent to treatment. TDN performed to bilateral suboccipital region/ mid-cervical paraspinals with 4, 0.30 x 50 L-type single needle placements on each side with 2 local twitch response (LTR). Pistoning technique utilized. Improved pain-free motion following intervention.  Increase discomfort reported with suboccipital region (R>L).  Applied Biofreeze to upper back/ neck region in prone position.          PT Long Term Goals - 02/09/21 1134       PT LONG TERM GOAL #1   Title Pt. will improve FOTO score to >62 to improve independence with ADLs *foot*    Baseline 42 9/6: 57 10/20:65    Time 6    Period Weeks    Status Achieved    Target Date 11/15/20      PT LONG TERM GOAL #2   Title Pt will improve R  ankle MMT to qual to L to faciliate increased stability for return to walks on the beach.    Baseline R/L: 3/5 Ankle dorsiflexion    5/25 bilat Sl calf raise Ankle plantarflexion  *pain with all MMT testing of the ankle/foot*  3/5 ankle inversion   3/5 ankle eversion 9/6: R/L: 4+/5 Ankle dorsiflexion    8/25 bilat Sl calf raise Ankle plantarflexion  *pain with all MMT testing of the ankle/foot*  4/5 ankle inversion   4/5 ankle eversion. 10/20: bilat Sl calf raise Ankle plantarflexion 8/25  *pain with all MMT testing of the ankle/foot*  4+/5 ankle inversion   4+/5 ankle eversion, 4+/5 ankle DF.    Time 6    Period Weeks    Status Achieved    Target Date 12/31/20      PT LONG TERM GOAL #3   Title Pt will be able to complete 20 steps with R ankle pain >3/10 to facilitate greater mobility within the home.    Baseline 7-8/10 NPS 9/6: 2/10 at the lowest. 8 at the worse. Stair climbing is very difficult 10/20: Pt was able to complete >20 steps with 0/10.    Time 6    Period Weeks    Status Achieved    Target Date 11/15/20      PT LONG TERM GOAL #4   Title Pt will be able to complete work/home mobility tasks with laced ankle braced doff >50% for return to work specific foot wear.    Baseline Pt current needs to wear high cushion tennis shoes to work, brace donned 100% 9/6: laced brace doffed during ADLs the last 4 days. PT will wear brace during long work days.10/20: Pt is currently not using laced ankle braced and able to complete all work related tasks.    Time 6    Period Weeks    Status Achieved    Target Date 11/15/20      PT LONG TERM GOAL #5   Title Pt will improve FOTO score to predicted improvement value of 62 in regards to cervical impairments.    Baseline 52 10/20:47.  12/5: 63    Time 4    Period Weeks    Status Achieved    Target Date 12/31/20      PT LONG TERM GOAL #6   Title Pt will improve worse cervical pain by atleast 3 NPS points to allow increased tolerance during work and  home related ADLs.    Baseline NPS: 8/10 current, 7/10 best, 10/10 Worse. 10/20: Worse pain 7/10  Time 8    Period Weeks    Status Partially Met    Target Date 04/04/21      PT LONG TERM GOAL #7   Title Pt will improve bilat UE MMT by atleast 1/2 grade to facilitate greater functional abilities with ADLs related to caregiving.    Baseline UE MMT R/L:   Shoulder Flexion: 4/4-  Shoulder abduction: 4/4-  Shoulder ER: 4/4  Shoulder IR: 4/4  Elbow ext: 5/5   Eblow flex: 5/5 10/20: UE MMT R/L:   Shoulder Flexion: 4/4  Shoulder abduction: 4/4 Shoulder ER: 4/4  Shoulder IR: 4/4  Elbow ext: 5/5   Eblow flex: 5/5    Time 8    Period Weeks    Status Partially Met    Target Date 04/04/21      PT LONG TERM GOAL #8   Title Pt will improve functional R hand strength equal to L to promote return of hand useage during grooming and feeding.    Baseline Grip R/L: 76.1 / 33.9    Lateral prehension grip R/L: 14 / 4 10/20: Grip R/L: 62.0 / 41.4    Lateral prehension grip R/L: 13 / 6.  1/12:  R 69#/ L 49.8#    Time 8    Period Weeks    Status Partially Met    Target Date 04/04/21                   Plan - 02/23/21 1311     Clinical Impression Statement PT discussed benefits of pts. personal TENS unit and pt. will bring next tx. session to determine optimal parameters to address neck pain.  Pt. continues to present with several trigger points in upper back/ neck musculature.  Pt. has good cervical and shoulder AROM with pain at end-range cervical rotn./ lateral flexion.  Good tx. tolerance with TDN and pt. will continue with stretching and consistent massge to improve pain-free mobility.    Personal Factors and Comorbidities Comorbidity 2;Profession;Past/Current Experience    Comorbidities Hypertension, Obesity    Examination-Activity Limitations Squat;Locomotion Level    Examination-Participation Restrictions Occupation;Cleaning;Laundry    Stability/Clinical Decision Making Evolving/Moderate  complexity    Clinical Decision Making Moderate    Rehab Potential Good    PT Frequency Biweekly    PT Duration 8 weeks    PT Treatment/Interventions ADLs/Self Care Home Management;Aquatic Therapy;Biofeedback;Cryotherapy;Electrical Stimulation;Moist Heat;Ultrasound;Functional mobility training;Therapeutic activities;Therapeutic exercise;Balance training;Neuromuscular re-education;Patient/family education;Manual techniques;Scar mobilization;Passive range of motion;Dry needling;Energy conservation;Taping;Gait training;Stair training;DME Instruction;Spinal Manipulations;Joint Manipulations    PT Next Visit Plan Check use of TENS/ percussion gun.    PT Home Exercise Plan [id number].  New HEP: [id number]    Consulted and Agree with Plan of Care Patient             Patient will benefit from skilled therapeutic intervention in order to improve the following deficits and impairments:  Decreased mobility, Decreased endurance, Hypomobility, Decreased range of motion, Decreased scar mobility, Improper body mechanics, Postural dysfunction, Impaired flexibility, Decreased strength, Decreased activity tolerance, Abnormal gait, Decreased balance, Difficulty walking, Increased edema, Impaired perceived functional ability  Visit Diagnosis: Joint stiffness of spine  Pain in right arm  Muscle weakness (generalized)     Problem List Patient Active Problem List   Diagnosis Date Noted   H/O of hemilaminectomy 02/15/2019   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[place]) 11/17/2018   Fatigue 09/23/2017   History of pneumonia 04/30/2016   Grief reaction 04/30/2016   Vitamin D deficiency 02/22/2015   Hyperlipidemia 01/01/2015  Visit for preventive health examination 12/04/2013   Lower extremity edema 09/02/2013   Essential hypertension 09/02/2013   History of cardiomyopathy 09/02/2013   SVT (supraventricular tachycardia) ([place]) 09/02/2013   Palpitations 09/02/2013   Contrast media allergy 03/10/2013    Fibrocystic breast disease    Morbid obesity ([place]) 02/05/2012   Meniere disease    Pura Spice, PT, DPT # [id number] 02/23/2021, 1:23 PM  [place] [facility] [address] [place], [place], [address] Phone: [phone]   Fax:  [phone]  Name: Amanda Humphrey MRN: [id number] Date of Birth: [birth year]

## 2021-03-06 DIAGNOSIS — F900 Attention-deficit hyperactivity disorder, predominantly inattentive type: Secondary | ICD-10-CM | POA: Diagnosis not present

## 2021-03-07 ENCOUNTER — Other Ambulatory Visit: Payer: Self-pay

## 2021-03-07 ENCOUNTER — Ambulatory Visit: Payer: BC Managed Care – PPO | Attending: Podiatry | Admitting: Physical Therapy

## 2021-03-07 DIAGNOSIS — R29898 Other symptoms and signs involving the musculoskeletal system: Secondary | ICD-10-CM | POA: Insufficient documentation

## 2021-03-07 DIAGNOSIS — M2560 Stiffness of unspecified joint, not elsewhere classified: Secondary | ICD-10-CM | POA: Insufficient documentation

## 2021-03-07 DIAGNOSIS — M6281 Muscle weakness (generalized): Secondary | ICD-10-CM | POA: Diagnosis not present

## 2021-03-07 DIAGNOSIS — M79601 Pain in right arm: Secondary | ICD-10-CM | POA: Insufficient documentation

## 2021-03-09 NOTE — Therapy (Signed)
[facility] [address] [place], [place], [address] Phone: [phone]   Fax:  [phone]  Physical Therapy Treatment  Patient Details  Name: Amanda Humphrey MRN: [id number] Date of Birth: 01/18/1966 Referring Provider (PT): Orbie Pyo, [place]   Encounter Date: 03/07/2021   PT End of Session - 03/09/21 0836     Visit Number 38    Number of Visits 44    Date for PT Re-Evaluation 04/04/21    Authorization - Visit Number 8    Authorization - Number of Visits 10    PT Start Time 0945    PT Stop Time 3500    PT Time Calculation (min) 46 min    Activity Tolerance Patient tolerated treatment well;Patient limited by pain    Behavior During Therapy [facility] for tasks assessed/performed             Past Medical History:  Diagnosis Date   Allergy    Chronic kidney disease    stones   Fibrocystic breast disease 2013   Meniere disease    Migraines     Past Surgical History:  Procedure Laterality Date   ABDOMINAL HYSTERECTOMY  2011   laprascopic supracervical, [place]   APPENDECTOMY  1999   BREAST BIOPSY Right Nov 2012    fibrocystic disease, [place]   CESAREAN SECTION     4 total   CHOLECYSTECTOMY  2011   [place]    There were no vitals filed for this visit.   Subjective Assessment - 03/09/21 0835     Subjective Pt. reports no new complaints.  Pt. remains compliant with HEP/ stretches.  Pt. has trip to [place] this afternoon for work.    Pertinent History MRI results: Rupture of R ant talofibular ligament. History of severe neck pain that causes radiation to the R UE. Cervical Srugery C6-T1(early 2021). History of bilat plantar fasciitis and heel spurs. Pt's spouse has health complication resulting with increased home ADL reponsibilies. Pt enjoys visiting and walking on the beach, which she is currently unable to do at this time 2/2 ankle instability and pain.    Limitations Lifting;House  hold activities    How long can you sit comfortably? WNL    How long can you stand comfortably? WNL with brace, fear of movement without brace.    How long can you walk comfortably? WNL with brace, fear of movement without brace.    Diagnostic tests MRI    Patient Stated Goals Decrease muscle spasms, return to work    Currently in Pain? Yes    Pain Score 5     Pain Location Neck    Pain Orientation Right;Left    Pain Onset More than a month ago    Pain Onset More than a month ago              Manual Therapy:   Seated cervical/ B shoulder AROM reassessment.    Supine L/R UT, levator, and rotn. Stretches.  STM to L/T UT and posterior deltoid.     Prone STM to L and R upper trap, levator scapulae, rhomboid, teres minor, and infraspinatus.      Prone manual suboccipital release technique 5x with static holds.    Seated thoracic rotation/ discussion of back pain at end of tx.      Trigger Point Dry Needling (TDN)   Education performed with patient regarding potential benefit of TDN. Reviewed precautions and risks with patient. Reviewed special  precautions/risks over lung fields which include pneumothorax. Reviewed signs and symptoms of pneumothorax and advised pt to go to ER immediately if these symptoms develop advise them of dry needling treatment. Pt provided verbal consent to treatment. TDN performed to bilateral suboccipital region/ mid-cervical paraspinals with 4, 0.30 x 50 L-type single needle placements on each side with 2 local twitch response (LTR). Pistoning technique utilized. Improved pain-free motion following intervention.  Increase discomfort reported with suboccipital region (R>L).  Applied Biofreeze to upper back/ neck region in prone position.      Pt. Had a slight increase in discomfort in mid-back after tx. When going from prone to seated posture on mat table.  Pt. Reports pain is not new and will benefit from movement.       PT Long Term Goals - 02/09/21  1134       PT LONG TERM GOAL #1   Title Pt. will improve FOTO score to >62 to improve independence with ADLs *foot*    Baseline 42 9/6: 57 10/20:65    Time 6    Period Weeks    Status Achieved    Target Date 11/15/20      PT LONG TERM GOAL #2   Title Pt will improve R ankle MMT to qual to L to faciliate increased stability for return to walks on the beach.    Baseline R/L: 3/5 Ankle dorsiflexion    5/25 bilat Sl calf raise Ankle plantarflexion  *pain with all MMT testing of the ankle/foot*  3/5 ankle inversion   3/5 ankle eversion 9/6: R/L: 4+/5 Ankle dorsiflexion    8/25 bilat Sl calf raise Ankle plantarflexion  *pain with all MMT testing of the ankle/foot*  4/5 ankle inversion   4/5 ankle eversion. 10/20: bilat Sl calf raise Ankle plantarflexion 8/25  *pain with all MMT testing of the ankle/foot*  4+/5 ankle inversion   4+/5 ankle eversion, 4+/5 ankle DF.    Time 6    Period Weeks    Status Achieved    Target Date 12/31/20      PT LONG TERM GOAL #3   Title Pt will be able to complete 20 steps with R ankle pain >3/10 to facilitate greater mobility within the home.    Baseline 7-8/10 NPS 9/6: 2/10 at the lowest. 8 at the worse. Stair climbing is very difficult 10/20: Pt was able to complete >20 steps with 0/10.    Time 6    Period Weeks    Status Achieved    Target Date 11/15/20      PT LONG TERM GOAL #4   Title Pt will be able to complete work/home mobility tasks with laced ankle braced doff >50% for return to work specific foot wear.    Baseline Pt current needs to wear high cushion tennis shoes to work, brace donned 100% 9/6: laced brace doffed during ADLs the last 4 days. PT will wear brace during long work days.10/20: Pt is currently not using laced ankle braced and able to complete all work related tasks.    Time 6    Period Weeks    Status Achieved    Target Date 11/15/20      PT LONG TERM GOAL #5   Title Pt will improve FOTO score to predicted improvement value of 62 in  regards to cervical impairments.    Baseline 52 10/20:47.  12/5: 63    Time 4    Period Weeks    Status Achieved  Target Date 12/31/20      PT LONG TERM GOAL #6   Title Pt will improve worse cervical pain by atleast 3 NPS points to allow increased tolerance during work and home related ADLs.    Baseline NPS: 8/10 current, 7/10 best, 10/10 Worse. 10/20: Worse pain 7/10    Time 8    Period Weeks    Status Partially Met    Target Date 04/04/21      PT LONG TERM GOAL #7   Title Pt will improve bilat UE MMT by atleast 1/2 grade to facilitate greater functional abilities with ADLs related to caregiving.    Baseline UE MMT R/L:   Shoulder Flexion: 4/4-  Shoulder abduction: 4/4-  Shoulder ER: 4/4  Shoulder IR: 4/4  Elbow ext: 5/5   Eblow flex: 5/5 10/20: UE MMT R/L:   Shoulder Flexion: 4/4  Shoulder abduction: 4/4 Shoulder ER: 4/4  Shoulder IR: 4/4  Elbow ext: 5/5   Eblow flex: 5/5    Time 8    Period Weeks    Status Partially Met    Target Date 04/04/21      PT LONG TERM GOAL #8   Title Pt will improve functional R hand strength equal to L to promote return of hand useage during grooming and feeding.    Baseline Grip R/L: 76.1 / 33.9    Lateral prehension grip R/L: 14 / 4 10/20: Grip R/L: 62.0 / 41.4    Lateral prehension grip R/L: 13 / 6.  1/12:  R 69#/ L 49.8#    Time 8    Period Weeks    Status Partially Met    Target Date 04/04/21                   Plan - 03/09/21 0837     Clinical Impression Statement Increase cervical pain with L/R rotn. in seated and supine position.  PT focused on manual stretches with static holds at pain tolerable end-range.  Good cervical traction/ subocciptial release with no increase c/o pain.  Pt. continues to benefit from TDN to suboccipital region and B UT trigger points.  Pt. understands current HEP and importance of being aware of proper posture/ body mechanics with work-related tasks and household activities.    Personal Factors and  Comorbidities Comorbidity 2;Profession;Past/Current Experience    Comorbidities Hypertension, Obesity    Examination-Activity Limitations Squat;Locomotion Level    Examination-Participation Restrictions Occupation;Cleaning;Laundry    Stability/Clinical Decision Making Evolving/Moderate complexity    Clinical Decision Making Moderate    Rehab Potential Good    PT Frequency Biweekly    PT Duration 8 weeks    PT Treatment/Interventions ADLs/Self Care Home Management;Aquatic Therapy;Biofeedback;Cryotherapy;Electrical Stimulation;Moist Heat;Ultrasound;Functional mobility training;Therapeutic activities;Therapeutic exercise;Balance training;Neuromuscular re-education;Patient/family education;Manual techniques;Scar mobilization;Passive range of motion;Dry needling;Energy conservation;Taping;Gait training;Stair training;DME Instruction;Spinal Manipulations;Joint Manipulations    PT Next Visit Plan Check use of TENS/ percussion gun.    PT Home Exercise Plan [id number].  New HEP: [id number]    Consulted and Agree with Plan of Care Patient             Patient will benefit from skilled therapeutic intervention in order to improve the following deficits and impairments:  Decreased mobility, Decreased endurance, Hypomobility, Decreased range of motion, Decreased scar mobility, Improper body mechanics, Postural dysfunction, Impaired flexibility, Decreased strength, Decreased activity tolerance, Abnormal gait, Decreased balance, Difficulty walking, Increased edema, Impaired perceived functional ability  Visit Diagnosis: Joint stiffness of spine  Pain in right arm  Muscle weakness (generalized)  Problem List Patient Active Problem List   Diagnosis Date Noted   H/O of hemilaminectomy 02/15/2019   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[place]) 11/17/2018   Fatigue 09/23/2017   History of pneumonia 04/30/2016   Grief reaction 04/30/2016   Vitamin D deficiency 02/22/2015   Hyperlipidemia 01/01/2015    Visit for preventive health examination 12/04/2013   Lower extremity edema 09/02/2013   Essential hypertension 09/02/2013   History of cardiomyopathy 09/02/2013   SVT (supraventricular tachycardia) ([place]) 09/02/2013   Palpitations 09/02/2013   Contrast media allergy 03/10/2013   Fibrocystic breast disease    Morbid obesity ([place]) 02/05/2012   Meniere disease    Pura Spice, PT, DPT # [phone] 03/09/2021, 8:45 AM  [facility] [address] [place], [place], [address] Phone: [phone]   Fax:  [phone]  Name: Amanda Humphrey MRN: [id number] Date of Birth: [birth year]

## 2021-03-20 ENCOUNTER — Other Ambulatory Visit: Payer: Self-pay

## 2021-03-20 ENCOUNTER — Ambulatory Visit (INDEPENDENT_AMBULATORY_CARE_PROVIDER_SITE_OTHER): Payer: BC Managed Care – PPO | Admitting: Internal Medicine

## 2021-03-20 ENCOUNTER — Encounter: Payer: Self-pay | Admitting: Internal Medicine

## 2021-03-20 VITALS — BP 122/88 | HR 77 | Temp 98.8°F | Ht 68.0 in | Wt 243.2 lb

## 2021-03-20 DIAGNOSIS — I10 Essential (primary) hypertension: Secondary | ICD-10-CM | POA: Diagnosis not present

## 2021-03-20 DIAGNOSIS — Z1231 Encounter for screening mammogram for malignant neoplasm of breast: Secondary | ICD-10-CM | POA: Diagnosis not present

## 2021-03-20 DIAGNOSIS — E559 Vitamin D deficiency, unspecified: Secondary | ICD-10-CM

## 2021-03-20 DIAGNOSIS — Z0000 Encounter for general adult medical examination without abnormal findings: Secondary | ICD-10-CM

## 2021-03-20 DIAGNOSIS — E782 Mixed hyperlipidemia: Secondary | ICD-10-CM

## 2021-03-20 DIAGNOSIS — E538 Deficiency of other specified B group vitamins: Secondary | ICD-10-CM

## 2021-03-20 DIAGNOSIS — M5412 Radiculopathy, cervical region: Secondary | ICD-10-CM

## 2021-03-20 DIAGNOSIS — R7301 Impaired fasting glucose: Secondary | ICD-10-CM | POA: Diagnosis not present

## 2021-03-20 DIAGNOSIS — Z114 Encounter for screening for human immunodeficiency virus [HIV]: Secondary | ICD-10-CM | POA: Diagnosis not present

## 2021-03-20 DIAGNOSIS — Z1211 Encounter for screening for malignant neoplasm of colon: Secondary | ICD-10-CM

## 2021-03-20 DIAGNOSIS — R5383 Other fatigue: Secondary | ICD-10-CM | POA: Diagnosis not present

## 2021-03-20 DIAGNOSIS — M4692 Unspecified inflammatory spondylopathy, cervical region: Secondary | ICD-10-CM

## 2021-03-20 LAB — CBC WITH DIFFERENTIAL/PLATELET
Basophils Absolute: 0.1 10*3/uL (ref 0.0–0.1)
Basophils Relative: 1.2 % (ref 0.0–3.0)
Eosinophils Absolute: 0.1 10*3/uL (ref 0.0–0.7)
Eosinophils Relative: 2.2 % (ref 0.0–5.0)
HCT: 40.4 % (ref 36.0–46.0)
Hemoglobin: 13.5 g/dL (ref 12.0–15.0)
Lymphocytes Relative: 29 % (ref 12.0–46.0)
Lymphs Abs: 1.7 10*3/uL (ref 0.7–4.0)
MCHC: 33.5 g/dL (ref 30.0–36.0)
MCV: 88.8 fl (ref 78.0–100.0)
Monocytes Absolute: 0.3 10*3/uL (ref 0.1–1.0)
Monocytes Relative: 4.6 % (ref 3.0–12.0)
Neutro Abs: 3.6 10*3/uL (ref 1.4–7.7)
Neutrophils Relative %: 63 % (ref 43.0–77.0)
Platelets: 321 10*3/uL (ref 150.0–400.0)
RBC: 4.55 Mil/uL (ref 3.87–5.11)
RDW: 13.9 % (ref 11.5–15.5)
WBC: 5.8 10*3/uL (ref 4.0–10.5)

## 2021-03-20 LAB — COMPREHENSIVE METABOLIC PANEL
ALT: 21 U/L (ref 0–35)
AST: 17 U/L (ref 0–37)
Albumin: 4.6 g/dL (ref 3.5–5.2)
Alkaline Phosphatase: 76 U/L (ref 39–117)
BUN: 16 mg/dL (ref 6–23)
CO2: 32 mEq/L (ref 19–32)
Calcium: 10.2 mg/dL (ref 8.4–10.5)
Chloride: 100 mEq/L (ref 96–112)
Creatinine, Ser: 0.92 mg/dL (ref 0.40–1.20)
GFR: 69.58 mL/min (ref >60.00)
Glucose, Bld: 81 mg/dL (ref 70–99)
Potassium: 4.5 mEq/L (ref 3.5–5.1)
Sodium: 138 mEq/L (ref 135–145)
Total Bilirubin: 0.9 mg/dL (ref 0.2–1.2)
Total Protein: 6.9 g/dL (ref 6.0–8.3)

## 2021-03-20 LAB — LIPID PANEL
Cholesterol: 250 mg/dL — ABNORMAL HIGH (ref 0–200)
HDL: 57.9 mg/dL (ref >39.00)
LDL Cholesterol: 168 mg/dL — ABNORMAL HIGH (ref 0–99)
NonHDL: 192.39
Total CHOL/HDL Ratio: 4
Triglycerides: 122 mg/dL (ref 0.0–149.0)
VLDL: 24.4 mg/dL (ref 0.0–40.0)

## 2021-03-20 LAB — MICROALBUMIN / CREATININE URINE RATIO
Creatinine,U: 107.7 mg/dL
Microalb Creat Ratio: 0.7 mg/g (ref 0.0–30.0)
Microalb, Ur: <0.7 mg/dL (ref 0.0–1.9)

## 2021-03-20 LAB — HEMOGLOBIN A1C: Hgb A1c MFr Bld: 5.3 % (ref 4.6–6.5)

## 2021-03-20 LAB — VITAMIN D 25 HYDROXY (VIT D DEFICIENCY, FRACTURES): VITD: 16.8 ng/mL — ABNORMAL LOW (ref 30.00–100.00)

## 2021-03-20 LAB — VITAMIN B12: Vitamin B-12: 244 pg/mL (ref 211–911)

## 2021-03-20 MED ORDER — ZOSTER VAC RECOMB ADJUVANTED 50 MCG/0.5ML IM SUSR: 0.5000 mL | Freq: Once | INTRAMUSCULAR | 1 refills | Status: AC

## 2021-03-20 NOTE — Patient Instructions (Addendum)
Check on tetanus vaccination  you are overdue by my records  Cologuard   JUST DO IT WHEN IT COMES TO HOUSE!   The ShingRx vaccine is now available in local pharmacies and is much more protective than the old one  Zostavax  (it is about 97%  Effective in preventing shingles). .   It is therefore ADVISED for all interested adults over 50 to prevent shingles so I have printed you a prescription for it.  (it requires a 2nd dose 2 to 6 months after the first one) .  It will cause you to have flu  like symptoms for 2 days

## 2021-03-20 NOTE — Assessment & Plan Note (Signed)

## 2021-03-20 NOTE — Assessment & Plan Note (Addendum)
Managing symptoms of headaches,  Muscle spasms  left arm with dry needling every 2 weeks and PT  Every other week. Pain has significantly improved,  She is intent on avoiding second surgery  

## 2021-03-20 NOTE — Assessment & Plan Note (Signed)
Well controlled on current regimen. Renal function stable, no changes today. 

## 2021-03-20 NOTE — Assessment & Plan Note (Signed)
Etiology unclear.  Will screen for thyroid, anemia,  hepatic and renal insufficiency, encourage regular exercise 5 days /week,  consider sleep study and cardiology evaluation if  Snoring noted or exertional dyspnea reported 

## 2021-03-20 NOTE — Progress Notes (Signed)
The patient is here for annual preventive examination and management of other chronic and acute problems.This visit occurred during the SARS-CoV-2 public health emergency.  Safety protocols were in place, including screening questions prior to the visit, additional usage of staff PPE, and extensive cleaning of exam room while observing appropriate contact time as indicated for disinfecting solutions.     The risk factors are reflected in the social history.   The roster of all physicians providing medical care to patient - is listed in the Snapshot section of the chart.   Activities of daily living:  The patient is 100% independent in all ADLs: dressing, toileting, feeding as well as independent mobility   Home safety : The patient has smoke detectors in the home. They wear seatbelts.  There are no unsecured firearms at home. There is no violence in the home.    There is no risks for hepatitis, STDs or HIV. There is no   history of blood transfusion. They have no travel history to infectious disease endemic areas of the world.   The patient has seen their dentist in the last six month. They have seen their eye doctor in the last year. The patinet  denies slight hearing difficulty with regard to whispered voices and some television programs.  They have deferred audiologic testing in the last year.  They do not  have excessive sun exposure. Discussed the need for sun protection: hats, long sleeves and use of sunscreen if there is significant sun exposure.    Diet: the importance of a healthy diet is discussed. They do have a healthy diet.   The benefits of regular aerobic exercise were discussed. The patient  had an ankle injury in July 2022 and damaged several tendons (by MRI) ; she is is limited in ability to exercise.  She has resumed walking several days per week .      Depression screen: there are no signs or vegative symptoms of depression- irritability, change in appetite, anhedonia,  sadness/tearfullness.   The following portions of the patient's history were reviewed and updated as appropriate: allergies, current medications, past family history, past medical history,  past surgical history, past social history  and problem list.   Visual acuity was not assessed per patient preference since the patient has regular follow up with an  ophthalmologist. Hearing and body mass index were assessed and reviewed.    During the course of the visit the patient was educated and counseled about appropriate screening and preventive services including : fall prevention , diabetes screening, nutrition counseling, colorectal cancer screening, and recommended immunizations.    Breast and PAP done in 2022 by KJ   Chief Complaint:  1) Fatigue has a strong FH of hypothyroid .  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  No snoring,     2) in October  2022 had MRI c spine for left arm numbness. Despite first surgery in 2021 .  Doing PT and dry needling instead of repeat surgery.   3) HTN: taking carvedilol and hctz.  Home readings < 130/80 most days  4) ADD:  takes adderall   Review of Symptoms  Patient denies headache, fevers, malaise, unintentional weight loss, skin rash, eye pain, sinus congestion and sinus pain, sore throat, dysphagia,  hemoptysis , cough, dyspnea, wheezing, chest pain, palpitations, orthopnea, edema, abdominal pain, nausea, melena, diarrhea, constipation, flank pain, dysuria, hematuria, urinary  Frequency, nocturia, numbness, tingling, seizures,  Focal weakness, Loss of consciousness,  Tremor, insomnia, depression, anxiety, and suicidal ideation.    Physical Exam:  BP 122/88 (BP Location: Left Arm, Patient Position: Sitting, Cuff Size: Large)    Pulse 77    Temp 98.8 F (37.1 C) (Oral)    Ht 5\' 8"  (1.727 m)    Wt 243 lb 3.2 oz (110.3 kg)    SpO2 97%    BMI 36.98 kg/m      Assessment and Plan:  No problem-specific Assessment & Plan notes found for  this encounter.   Updated Medication List Outpatient Encounter Medications as of 03/20/2021  Medication Sig   amphetamine-dextroamphetamine (ADDERALL) 20 MG tablet Take 20 mg by mouth every evening.    benzonatate (TESSALON) 100 MG capsule Take 1 capsule (100 mg total) by mouth every 8 (eight) hours.   carvedilol (COREG) 6.25 MG tablet TAKE 1 TABLET TWICE A DAY   cyclobenzaprine (FLEXERIL) 5 MG tablet Take 5 mg by mouth at bedtime.   ezetimibe (ZETIA) 10 MG tablet Take 1 tablet (10 mg total) by mouth daily.   furosemide (LASIX) 20 MG tablet Take 1 tablet (20 mg total) by mouth daily as needed.   hydrochlorothiazide (HYDRODIURIL) 25 MG tablet Take 1 tablet (25 mg total) by mouth daily.   methocarbamol (ROBAXIN) 500 MG tablet TAKE 1 TABLET TWICE A DAY   omeprazole (PRILOSEC) 40 MG capsule TAKE 1 CAPSULE DAILY   ondansetron (ZOFRAN ODT) 4 MG disintegrating tablet Take 1 tablet (4 mg total) by mouth every 8 (eight) hours as needed for nausea or vomiting.   rosuvastatin (CRESTOR) 5 MG tablet Take 1 tablet (5 mg total) by mouth daily.   [DISCONTINUED] predniSONE (STERAPRED UNI-PAK 21 TAB) 10 MG (21) TBPK tablet Take by mouth daily. Take 6 tabs by mouth daily  for 2 days, then 5 tabs for 2 days, then 4 tabs for 2 days, then 3 tabs for 2 days, 2 tabs for 2 days, then 1 tab by mouth daily for 2 days   No facility-administered encounter medications on file as of 03/20/2021.

## 2021-03-21 ENCOUNTER — Ambulatory Visit: Payer: BC Managed Care – PPO | Admitting: Physical Therapy

## 2021-03-21 DIAGNOSIS — M2560 Stiffness of unspecified joint, not elsewhere classified: Secondary | ICD-10-CM | POA: Diagnosis not present

## 2021-03-21 DIAGNOSIS — M6281 Muscle weakness (generalized): Secondary | ICD-10-CM

## 2021-03-21 DIAGNOSIS — M79601 Pain in right arm: Secondary | ICD-10-CM | POA: Diagnosis not present

## 2021-03-21 DIAGNOSIS — R29898 Other symptoms and signs involving the musculoskeletal system: Secondary | ICD-10-CM | POA: Diagnosis not present

## 2021-03-21 LAB — THYROID PANEL WITH TSH
Free Thyroxine Index: 2.9 (ref 1.4–3.8)
T3 Uptake: 29 % (ref 22–35)
T4, Total: 9.9 ug/dL (ref 5.1–11.9)
TSH: 0.95 mIU/L (ref 0.40–4.50)

## 2021-03-21 LAB — HIV ANTIBODY (ROUTINE TESTING W REFLEX): HIV 1&2 Ab, 4th Generation: NONREACTIVE

## 2021-03-21 MED ORDER — ERGOCALCIFEROL 1.25 MG (50000 UT) PO CAPS: 50000.0000 [IU] | ORAL_CAPSULE | ORAL | 4 refills | Status: DC

## 2021-03-21 NOTE — Addendum Note (Signed)
Addended by: Sherlene Shams on: 03/21/2021 04:17 PM   Modules accepted: Orders

## 2021-03-23 NOTE — Therapy (Addendum)
[facility] [address] [place], [place], [address] Phone: [phone]   Fax:  [phone]  Physical Therapy Treatment  Patient Details  Name: Amanda Humphrey MRN: [id number] Date of Birth: 07/21/1965 Referring Provider (PT): Orbie Pyo, [place]   Encounter Date: 03/21/2021    PT End of Session - 03/23/21 1458     Visit Number 39    Number of Visits 74    Date for PT Re-Evaluation 04/04/21    Authorization - Visit Number 9    Authorization - Number of Visits 10    PT Start Time 7494    PT Stop Time 4967    PT Time Calculation (min) 40 min    Activity Tolerance Patient tolerated treatment well;Patient limited by pain    Behavior During Therapy [facility] for tasks assessed/performed               Past Medical History:  Diagnosis Date   Allergy    Chronic kidney disease    stones   Fibrocystic breast disease 2013   Meniere disease    Migraines     Past Surgical History:  Procedure Laterality Date   ABDOMINAL HYSTERECTOMY  2011   laprascopic supracervical, [place]   APPENDECTOMY  1999   BREAST BIOPSY Right Nov 2012    fibrocystic disease, [place]   CESAREAN SECTION     4 total   CHOLECYSTECTOMY  2011   [place]    There were no vitals filed for this visit.   Subjective Assessment - 03/25/21 0809     Subjective Pt. reports no new complaints.  Pt. had f/u with PCP and no changes in POC.    Pertinent History MRI results: Rupture of R ant talofibular ligament. History of severe neck pain that causes radiation to the R UE. Cervical Srugery C6-T1(early 2021). History of bilat plantar fasciitis and heel spurs. Pt's spouse has health complication resulting with increased home ADL reponsibilies. Pt enjoys visiting and walking on the beach, which she is currently unable to do at this time 2/2 ankle instability and pain.    Limitations Lifting;House hold activities    How long can you sit  comfortably? WNL    How long can you stand comfortably? WNL with brace, fear of movement without brace.    How long can you walk comfortably? WNL with brace, fear of movement without brace.    Diagnostic tests MRI    Patient Stated Goals Decrease muscle spasms, return to work    Currently in Pain? Yes    Pain Score 5     Pain Location Neck    Pain Orientation Right;Left    Pain Type Chronic pain    Pain Onset More than a month ago    Pain Onset More than a month ago               Manual Therapy:   Supine L/R UT, levator, and rotn. Stretches.  STM to L/T UT and posterior deltoid.     Prone STM to L and R upper trap, levator scapulae, rhomboid, teres minor, and infraspinatus.      Prone manual suboccipital release technique 5x with static holds.     Seated thoracic rotation (no c/o back pain reported)     Trigger Point Dry Needling (TDN)   Education performed with patient regarding potential benefit of TDN. Reviewed precautions and risks with patient. Reviewed special precautions/risks over lung fields which include pneumothorax.  Reviewed signs and symptoms of pneumothorax and advised pt to go to ER immediately if these symptoms develop advise them of dry needling treatment. Pt provided verbal consent to treatment. TDN performed to bilateral suboccipital region/ mid-cervical paraspinals with 4, 0.30 x 50 L-type single needle placements on each side with 2 local twitch response (LTR). Pistoning technique utilized. Improved pain-free motion following intervention.  Increase discomfort reported with suboccipital region (R>L).  Applied Biofreeze to upper back/ neck region in prone position.         PT Long Term Goals - 02/09/21 1134       PT LONG TERM GOAL #1   Title Pt. will improve FOTO score to >62 to improve independence with ADLs *foot*    Baseline 42 9/6: 57 10/20:65    Time 6    Period Weeks    Status Achieved    Target Date 11/15/20      PT LONG TERM GOAL #2    Title Pt will improve R ankle MMT to qual to L to faciliate increased stability for return to walks on the beach.    Baseline R/L: 3/5 Ankle dorsiflexion    5/25 bilat Sl calf raise Ankle plantarflexion  *pain with all MMT testing of the ankle/foot*  3/5 ankle inversion   3/5 ankle eversion 9/6: R/L: 4+/5 Ankle dorsiflexion    8/25 bilat Sl calf raise Ankle plantarflexion  *pain with all MMT testing of the ankle/foot*  4/5 ankle inversion   4/5 ankle eversion. 10/20: bilat Sl calf raise Ankle plantarflexion 8/25  *pain with all MMT testing of the ankle/foot*  4+/5 ankle inversion   4+/5 ankle eversion, 4+/5 ankle DF.    Time 6    Period Weeks    Status Achieved    Target Date 12/31/20      PT LONG TERM GOAL #3   Title Pt will be able to complete 20 steps with R ankle pain >3/10 to facilitate greater mobility within the home.    Baseline 7-8/10 NPS 9/6: 2/10 at the lowest. 8 at the worse. Stair climbing is very difficult 10/20: Pt was able to complete >20 steps with 0/10.    Time 6    Period Weeks    Status Achieved    Target Date 11/15/20      PT LONG TERM GOAL #4   Title Pt will be able to complete work/home mobility tasks with laced ankle braced doff >50% for return to work specific foot wear.    Baseline Pt current needs to wear high cushion tennis shoes to work, brace donned 100% 9/6: laced brace doffed during ADLs the last 4 days. PT will wear brace during long work days.10/20: Pt is currently not using laced ankle braced and able to complete all work related tasks.    Time 6    Period Weeks    Status Achieved    Target Date 11/15/20      PT LONG TERM GOAL #5   Title Pt will improve FOTO score to predicted improvement value of 62 in regards to cervical impairments.    Baseline 52 10/20:47.  12/5: 63    Time 4    Period Weeks    Status Achieved    Target Date 12/31/20      PT LONG TERM GOAL #6   Title Pt will improve worse cervical pain by atleast 3 NPS points to allow increased  tolerance during work and home related ADLs.    Baseline NPS: 8/10  current, 7/10 best, 10/10 Worse. 10/20: Worse pain 7/10    Time 8    Period Weeks    Status Partially Met    Target Date 04/04/21      PT LONG TERM GOAL #7   Title Pt will improve bilat UE MMT by atleast 1/2 grade to facilitate greater functional abilities with ADLs related to caregiving.    Baseline UE MMT R/L:   Shoulder Flexion: 4/4-  Shoulder abduction: 4/4-  Shoulder ER: 4/4  Shoulder IR: 4/4  Elbow ext: 5/5   Eblow flex: 5/5 10/20: UE MMT R/L:   Shoulder Flexion: 4/4  Shoulder abduction: 4/4 Shoulder ER: 4/4  Shoulder IR: 4/4  Elbow ext: 5/5   Eblow flex: 5/5    Time 8    Period Weeks    Status Partially Met    Target Date 04/04/21      PT LONG TERM GOAL #8   Title Pt will improve functional R hand strength equal to L to promote return of hand useage during grooming and feeding.    Baseline Grip R/L: 76.1 / 33.9    Lateral prehension grip R/L: 14 / 4 10/20: Grip R/L: 62.0 / 41.4    Lateral prehension grip R/L: 13 / 6.  1/12:  R 69#/ L 49.8#    Time 8    Period Weeks    Status Partially Met    Target Date 04/04/21                Plan - 03/25/21 0825     Clinical Impression Statement Tx. focus on cervical stretches/ manual tx. to UT and suboccipital region to decrease muscle tightness.  Several trigger points/ muscle tightness noted during palpation over cervical paraspinals/ R mid-trap musculature.  Good tx. tolerance with manual stretches/ trigger point dry needling with no increase c/o symptoms.  Pt. will continue with HEP/ daily correction of posture with work-related tasks to manage pain symptoms.    Personal Factors and Comorbidities Comorbidity 2;Profession;Past/Current Experience    Comorbidities Hypertension, Obesity    Examination-Activity Limitations Squat;Locomotion Level    Examination-Participation Restrictions Occupation;Cleaning;Laundry    Stability/Clinical Decision Making Evolving/Moderate  complexity    Clinical Decision Making Moderate    Rehab Potential Good    PT Frequency Biweekly    PT Duration 8 weeks    PT Treatment/Interventions ADLs/Self Care Home Management;Aquatic Therapy;Biofeedback;Cryotherapy;Electrical Stimulation;Moist Heat;Ultrasound;Functional mobility training;Therapeutic activities;Therapeutic exercise;Balance training;Neuromuscular re-education;Patient/family education;Manual techniques;Scar mobilization;Passive range of motion;Dry needling;Energy conservation;Taping;Gait training;Stair training;DME Instruction;Spinal Manipulations;Joint Manipulations    PT Next Visit Plan Check use of TENS/ percussion gun.  10th visit progress note next tx/ discuss schedule.    PT Home Exercise Plan [id number].  New HEP: [id number]    Consulted and Agree with Plan of Care Patient             Patient will benefit from skilled therapeutic intervention in order to improve the following deficits and impairments:  Decreased mobility, Decreased endurance, Hypomobility, Decreased range of motion, Decreased scar mobility, Improper body mechanics, Postural dysfunction, Impaired flexibility, Decreased strength, Decreased activity tolerance, Abnormal gait, Decreased balance, Difficulty walking, Increased edema, Impaired perceived functional ability  Visit Diagnosis: Joint stiffness of spine  Pain in right arm  Muscle weakness (generalized)  Ankle weakness     Problem List Patient Active Problem List   Diagnosis Date Noted   H/O of hemilaminectomy 02/15/2019   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[place]) 11/17/2018   Fatigue 09/23/2017   History of pneumonia 04/30/2016  Grief reaction 04/30/2016   Vitamin D deficiency 02/22/2015   Hyperlipidemia 01/01/2015   Visit for preventive health examination 12/04/2013   Lower extremity edema 09/02/2013   Essential hypertension 09/02/2013   History of cardiomyopathy 09/02/2013   SVT (supraventricular tachycardia) ([place]) 09/02/2013    Palpitations 09/02/2013   Contrast media allergy 03/10/2013   Fibrocystic breast disease    Morbid obesity ([place]) 02/05/2012   Meniere disease    Pura Spice, PT, DPT # [id number] 03/25/2021, 3:03 PM  [facility] [place] [facility] [address] [place], [place], [address] Phone: [phone]   Fax:  [phone]  Name: Amanda Humphrey MRN: [id number] Date of Birth: 03/15/1965

## 2021-04-04 ENCOUNTER — Other Ambulatory Visit: Payer: Self-pay

## 2021-04-04 ENCOUNTER — Ambulatory Visit: Payer: BC Managed Care – PPO | Attending: Podiatry | Admitting: Physical Therapy

## 2021-04-04 DIAGNOSIS — R29898 Other symptoms and signs involving the musculoskeletal system: Secondary | ICD-10-CM | POA: Diagnosis not present

## 2021-04-04 DIAGNOSIS — M79601 Pain in right arm: Secondary | ICD-10-CM | POA: Diagnosis not present

## 2021-04-04 DIAGNOSIS — M2560 Stiffness of unspecified joint, not elsewhere classified: Secondary | ICD-10-CM | POA: Insufficient documentation

## 2021-04-04 DIAGNOSIS — M6281 Muscle weakness (generalized): Secondary | ICD-10-CM | POA: Insufficient documentation

## 2021-04-06 NOTE — Therapy (Signed)
[facility] [address] [place], [place], [address] Phone: [phone]   Fax:  [phone]  Physical Therapy Treatment Physical Therapy Progress Note   Dates of reporting period  01/10/21  to 04/04/21.  Patient Details  Name: Amanda Humphrey MRN: [id number] Date of Birth: 08-06-1965 Referring Provider (PT): Orbie Pyo, [place]   Encounter Date: 04/04/2021   PT End of Session - 04/06/21 1029     Visit Number 40    Number of Visits 107    Date for PT Re-Evaluation 05/30/21    Authorization - Visit Number 10    Authorization - Number of Visits 10    PT Start Time 0901    PT Stop Time 0762    PT Time Calculation (min) 48 min    Activity Tolerance Patient tolerated treatment well;Patient limited by pain    Behavior During Therapy [facility] for tasks assessed/performed             Past Medical History:  Diagnosis Date   Allergy    Chronic kidney disease    stones   Fibrocystic breast disease 2013   Meniere disease    Migraines     Past Surgical History:  Procedure Laterality Date   ABDOMINAL HYSTERECTOMY  2011   laprascopic supracervical, [place]   APPENDECTOMY  1999   BREAST BIOPSY Right Nov 2012    fibrocystic disease, [place]   CESAREAN SECTION     4 total   CHOLECYSTECTOMY  2011   [place]    There were no vitals filed for this visit.   Subjective Assessment - 04/06/21 1023     Subjective Pt. states she has had a lot going on and a recent death in family over past couple weeks.  Pt. reports several areas of muscle tightness/ trigger points in upper back/ neck.    Pertinent History MRI results: Rupture of R ant talofibular ligament. History of severe neck pain that causes radiation to the R UE. Cervical Srugery C6-T1(early 2021). History of bilat plantar fasciitis and heel spurs. Pt's spouse has health complication resulting with increased home ADL reponsibilies. Pt enjoys visiting and  walking on the beach, which she is currently unable to do at this time 2/2 ankle instability and pain.    Limitations Lifting;House hold activities    How long can you sit comfortably? WNL    How long can you stand comfortably? WNL with brace, fear of movement without brace.    How long can you walk comfortably? WNL with brace, fear of movement without brace.    Diagnostic tests MRI    Patient Stated Goals Decrease muscle spasms, return to work    Currently in Pain? Yes    Pain Score 5     Pain Location Neck    Pain Onset More than a month ago    Pain Onset More than a month ago             Manual Therapy:   Supine L/R UT, levator, and rotn. Stretches.  STM to L/T UT and posterior deltoid.     Prone STM to L and R upper trap, levator scapulae, rhomboid, teres minor, and infraspinatus.      Prone manual suboccipital release technique 5x with static holds.     Seated thoracic rotation 2x each L/R     Trigger Point Dry Needling (TDN)   Education performed with patient regarding potential benefit of TDN. Reviewed precautions and  risks with patient. Reviewed special precautions/risks over lung fields which include pneumothorax. Reviewed signs and symptoms of pneumothorax and advised pt to go to ER immediately if these symptoms develop advise them of dry needling treatment. Pt provided verbal consent to treatment. TDN performed to bilateral suboccipital region/ mid-cervical paraspinals with 5, 0.30 x 50 L-type single needle placements on each side with 2 local twitch response (LTR). Pistoning technique utilized. Improved pain-free motion following intervention.  Increase discomfort reported with suboccipital region (R>L).  Applied Biofreeze to upper back/ neck region in prone position.           PT Long Term Goals - 04/06/21 1039       PT LONG TERM GOAL #1   Title Pt. will improve FOTO score to >62 to improve independence with ADLs *foot*    Baseline 42 9/6: 57 10/20:65    Time  6    Period Weeks    Status Achieved    Target Date 11/15/20      PT LONG TERM GOAL #2   Title Pt will improve R ankle MMT to qual to L to faciliate increased stability for return to walks on the beach.    Baseline R/L: 3/5 Ankle dorsiflexion    5/25 bilat Sl calf raise Ankle plantarflexion  *pain with all MMT testing of the ankle/foot*  3/5 ankle inversion   3/5 ankle eversion 9/6: R/L: 4+/5 Ankle dorsiflexion    8/25 bilat Sl calf raise Ankle plantarflexion  *pain with all MMT testing of the ankle/foot*  4/5 ankle inversion   4/5 ankle eversion. 10/20: bilat Sl calf raise Ankle plantarflexion 8/25  *pain with all MMT testing of the ankle/foot*  4+/5 ankle inversion   4+/5 ankle eversion, 4+/5 ankle DF.    Time 6    Period Weeks    Status Achieved    Target Date 12/31/20      PT LONG TERM GOAL #3   Title Pt will be able to complete 20 steps with R ankle pain >3/10 to facilitate greater mobility within the home.    Baseline 7-8/10 NPS 9/6: 2/10 at the lowest. 8 at the worse. Stair climbing is very difficult 10/20: Pt was able to complete >20 steps with 0/10.    Time 6    Period Weeks    Status Achieved    Target Date 11/15/20      PT LONG TERM GOAL #4   Title Pt will be able to complete work/home mobility tasks with laced ankle braced doff >50% for return to work specific foot wear.    Baseline Pt current needs to wear high cushion tennis shoes to work, brace donned 100% 9/6: laced brace doffed during ADLs the last 4 days. PT will wear brace during long work days.10/20: Pt is currently not using laced ankle braced and able to complete all work related tasks.    Time 6    Period Weeks    Status Achieved    Target Date 11/15/20      PT LONG TERM GOAL #5   Title Pt will improve FOTO score to predicted improvement value of 62 in regards to cervical impairments.    Baseline 52 10/20:47.  12/5: 63    Time 4    Period Weeks    Status Achieved    Target Date 12/31/20      PT LONG TERM  GOAL #6   Title Pt will improve worse cervical pain by atleast 3 NPS points to allow  increased tolerance during work and home related ADLs.    Baseline NPS: 8/10 current, 7/10 best, 10/10 Worse. 10/20: Worse pain 7/10    Time 8    Period Weeks    Status Partially Met    Target Date 05/30/21      PT LONG TERM GOAL #7   Title Pt will improve bilat UE MMT by atleast 1/2 grade to facilitate greater functional abilities with ADLs related to caregiving.    Baseline UE MMT R/L:   Shoulder Flexion: 4/4-  Shoulder abduction: 4/4-  Shoulder ER: 4/4  Shoulder IR: 4/4  Elbow ext: 5/5   Eblow flex: 5/5 10/20: UE MMT R/L:   Shoulder Flexion: 4/4  Shoulder abduction: 4/4 Shoulder ER: 4/4  Shoulder IR: 4/4  Elbow ext: 5/5   Eblow flex: 5/5    Time 8    Period Weeks    Status Partially Met    Target Date 05/30/21      PT LONG TERM GOAL #8   Title Pt will improve functional R hand strength equal to L to promote return of hand useage during grooming and feeding.    Baseline Grip R/L: 76.1 / 33.9    Lateral prehension grip R/L: 14 / 4 10/20: Grip R/L: 62.0 / 41.4    Lateral prehension grip R/L: 13 / 6.  1/12:  R 69#/ L 49.8#    Time 8    Period Weeks    Status Partially Met    Target Date 05/30/21                   Plan - 04/06/21 1031     Clinical Impression Statement Increase cervical pain with L/R rotn. in seated and supine position. PT focused on manual stretches with static holds at pain tolerable end-range. Good cervical traction/ subocciptial release with no increase c/o pain. Pt. continues to benefit from TDN to suboccipital region and B UT trigger points. Pt. understands current HEP and importance of being aware of proper posture/ body mechanics with work-related tasks and household activities.  See updated goals.    Personal Factors and Comorbidities Comorbidity 2;Profession;Past/Current Experience    Comorbidities Hypertension, Obesity    Examination-Activity Limitations  Squat;Locomotion Level    Examination-Participation Restrictions Occupation;Cleaning;Laundry    Stability/Clinical Decision Making Evolving/Moderate complexity    Clinical Decision Making Moderate    Rehab Potential Good    PT Frequency Biweekly    PT Duration 8 weeks    PT Treatment/Interventions ADLs/Self Care Home Management;Aquatic Therapy;Biofeedback;Cryotherapy;Electrical Stimulation;Moist Heat;Ultrasound;Functional mobility training;Therapeutic activities;Therapeutic exercise;Balance training;Neuromuscular re-education;Patient/family education;Manual techniques;Scar mobilization;Passive range of motion;Dry needling;Energy conservation;Taping;Gait training;Stair training;DME Instruction;Spinal Manipulations;Joint Manipulations    PT Next Visit Plan CHECK [place].  New HEP: [id number]    Consulted and Agree with Plan of Care Patient             Patient will benefit from skilled therapeutic intervention in order to improve the following deficits and impairments:  Decreased mobility, Decreased endurance, Hypomobility, Decreased range of motion, Decreased scar mobility, Improper body mechanics, Postural dysfunction, Impaired flexibility, Decreased strength, Decreased activity tolerance, Abnormal gait, Decreased balance, Difficulty walking, Increased edema, Impaired perceived functional ability  Visit Diagnosis: Joint stiffness of spine  Pain in right arm  Muscle weakness (generalized)  Ankle weakness     Problem List Patient Active Problem List   Diagnosis Date Noted   H/O of hemilaminectomy 02/15/2019   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[place]) 11/17/2018  Fatigue 09/23/2017   History of pneumonia 04/30/2016   Grief reaction 04/30/2016   Vitamin D deficiency 02/22/2015   Hyperlipidemia 01/01/2015   Visit for preventive health examination 12/04/2013   Lower extremity edema 09/02/2013   Essential hypertension 09/02/2013   History of  cardiomyopathy 09/02/2013   SVT (supraventricular tachycardia) ([place]) 09/02/2013   Palpitations 09/02/2013   Contrast media allergy 03/10/2013   Fibrocystic breast disease    Morbid obesity ([place]) 02/05/2012   Meniere disease    Pura Spice, PT, DPT # [id number] 04/06/2021, 10:41 AM  [facility] [address] [place], [place], [address] Phone: [phone]   Fax:  [phone]  Name: Amanda Humphrey MRN: [id number] Date of Birth: 03-15-1965

## 2021-04-14 DIAGNOSIS — Z1211 Encounter for screening for malignant neoplasm of colon: Secondary | ICD-10-CM | POA: Diagnosis not present

## 2021-04-15 ENCOUNTER — Ambulatory Visit: Payer: BC Managed Care – PPO | Admitting: Physical Therapy

## 2021-04-21 LAB — COLOGUARD: COLOGUARD: NEGATIVE

## 2021-05-03 ENCOUNTER — Encounter: Payer: Self-pay | Admitting: Physical Therapy

## 2021-05-03 ENCOUNTER — Ambulatory Visit: Payer: BC Managed Care – PPO | Attending: Podiatry | Admitting: Physical Therapy

## 2021-05-03 DIAGNOSIS — M25571 Pain in right ankle and joints of right foot: Secondary | ICD-10-CM | POA: Insufficient documentation

## 2021-05-03 DIAGNOSIS — R29898 Other symptoms and signs involving the musculoskeletal system: Secondary | ICD-10-CM | POA: Insufficient documentation

## 2021-05-03 DIAGNOSIS — M2560 Stiffness of unspecified joint, not elsewhere classified: Secondary | ICD-10-CM | POA: Insufficient documentation

## 2021-05-03 DIAGNOSIS — M6281 Muscle weakness (generalized): Secondary | ICD-10-CM | POA: Insufficient documentation

## 2021-05-03 DIAGNOSIS — M79601 Pain in right arm: Secondary | ICD-10-CM | POA: Diagnosis not present

## 2021-05-03 NOTE — Therapy (Signed)
?OUTPATIENT PHYSICAL THERAPY TREATMENT NOTE ? ? ?Patient Name: Amanda Humphrey ?MRN: [id number] ?DOB:[birth year], 56 y.o., female ?Today's Date: 05/03/2021 ? ?PCP: Crecencio Mc, MD ?REFERRING PROVIDER: Samara Deist, DPM ? ? PT End of Session - 05/03/21 1049   ? ? Visit Number 41   ? Number of Visits 48   ? Date for PT Re-Evaluation 05/30/21   ? Authorization - Visit Number 1   ? Authorization - Number of Visits 10   ? PT Start Time [phone]   ? PT Stop Time [phone]   ? PT Time Calculation (min) 49 min   ? Activity Tolerance Patient tolerated treatment well;Patient limited by pain   ? Behavior During Therapy [facility] for tasks assessed/performed   ? ?  ?  ? ?  ? ? ?Past Medical History:  ?Diagnosis Date  ? Allergy   ? Chronic kidney disease   ? stones  ? Fibrocystic breast disease 2013  ? Meniere disease   ? Migraines   ? ?Past Surgical History:  ?Procedure Laterality Date  ? ABDOMINAL HYSTERECTOMY  2011  ? laprascopic supracervical, [place]  ? APPENDECTOMY  1999  ? BREAST BIOPSY Right Nov 2012  ?  fibrocystic disease, [place]  ? CESAREAN SECTION    ? 4 total  ? CHOLECYSTECTOMY  2011  ? TONSILLECTOMY AND ADENOIDECTOMY  1975  ? ?Patient Active Problem List  ? Diagnosis Date Noted  ? H/O of hemilaminectomy 02/15/2019  ? 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[place]) 11/17/2018  ? Fatigue 09/23/2017  ? History of pneumonia 04/30/2016  ? Grief reaction 04/30/2016  ? Vitamin D deficiency 02/22/2015  ? Hyperlipidemia 01/01/2015  ? Visit for preventive health examination 12/04/2013  ? Lower extremity edema 09/02/2013  ? Essential hypertension 09/02/2013  ? History of cardiomyopathy 09/02/2013  ? SVT (supraventricular tachycardia) ([place]) 09/02/2013  ? Palpitations 09/02/2013  ? Contrast media allergy 03/10/2013  ? Fibrocystic breast disease   ? Morbid obesity ([place]) 02/05/2012  ? Meniere disease   ? ? ?REFERRING DIAG:  ?M79.18 (ICD-10-CM) - Myalgia, other site  ?W43.154 (ICD-10-CM) - Other specified postprocedural states  ?M54.12 (ICD-10-CM)  - Radiculopathy, cervical region  ? ? ?THERAPY DIAG:  ?Joint stiffness of spine ? ?Pain in right arm ? ?Muscle weakness (generalized) ? ?Ankle weakness ? ?PERTINENT HISTORY: See evaluation ? ?PRECAUTIONS: N/A ? ?SUBJECTIVE:  Pt. Reports stress level remains high.  Pts. Husband had a recent surgery/ hospital stay for gall bladder removal.   ? ?PAIN:  ?Are you having pain? Yes: NPRS scale: 5/10 ?Pain location: Neck ?Pain description: aching/ stiff/ tight ?Aggravating factors: prolonged sitting/ activity ?Relieving factors: stretches/ PT ? ? ? ? ?TODAY'S TREATMENT:  ?Manual Therapy: ?  ?Seated STM to cervical/ upper thoracic paraspinals.  Reassessment of cervical AROM (all planes) ?  ?Prone STM to L and R upper trap, levator scapulae, rhomboid, teres minor, and infraspinatus.    ?  ?Prone manual suboccipital release technique 5x with static holds.   ?  ?Seated thoracic rotation 2x each L/R.  Seated B shoulder flexion/ abduction (good posture).   ?  ?  ?Trigger Point Dry Needling (TDN) ?  ?Education performed with patient regarding potential benefit of TDN. Reviewed precautions and risks with patient. Reviewed special precautions/risks over lung fields which include pneumothorax. Reviewed signs and symptoms of pneumothorax and advised pt to go to ER immediately if these symptoms develop advise them of dry needling treatment. Pt provided verbal consent to treatment. TDN performed to bilateral suboccipital region/ mid-cervical paraspinals with  3, 0.30 x 50 L-type single needle placements on each side with 2 local twitch response (LTR). Pistoning technique utilized. Improved pain-free motion following intervention.  Increase discomfort reported with suboccipital region (R>L).  Applied Biofreeze to upper back/ neck region in prone position.   ?  ?  ?  ? ? ?PATIENT EDUCATION: ?Education details: HEP ?Person educated: Patient ?Education method: Explanation, Demonstration, and Handouts ?Education comprehension: verbalized  understanding and returned demonstration ? ? ?HOME EXERCISE PROGRAM: ?[id number].  New HEP: [id number]  ? ? ? ? PT Long Term Goals -   ? ?  ? PT LONG TERM GOAL #1  ? Title Pt. will improve FOTO score to >62 to improve independence with ADLs *foot*   ? Baseline 42 9/6: 57 10/20:65   ? Time 6   ? Period Weeks   ? Status Achieved   ? Target Date 11/15/20   ?  ? PT LONG TERM GOAL #2  ? Title Pt will improve R ankle MMT to qual to L to faciliate increased stability for return to walks on the beach.   ? Baseline R/L: 3/5 Ankle dorsiflexion    5/25 bilat Sl calf raise Ankle plantarflexion  *pain with all MMT testing of the ankle/foot*  3/5 ankle inversion   3/5 ankle eversion 9/6: R/L: 4+/5 Ankle dorsiflexion    8/25 bilat Sl calf raise Ankle plantarflexion  *pain with all MMT testing of the ankle/foot*  4/5 ankle inversion   4/5 ankle eversion. 10/20: bilat Sl calf raise Ankle plantarflexion 8/25  *pain with all MMT testing of the ankle/foot*  4+/5 ankle inversion   4+/5 ankle eversion, 4+/5 ankle DF.   ? Time 6   ? Period Weeks   ? Status Achieved   ? Target Date 12/31/20   ?  ? PT LONG TERM GOAL #3  ? Title Pt will be able to complete 20 steps with R ankle pain >3/10 to facilitate greater mobility within the home.   ? Baseline 7-8/10 NPS 9/6: 2/10 at the lowest. 8 at the worse. Stair climbing is very difficult 10/20: Pt was able to complete >20 steps with 0/10.   ? Time 6   ? Period Weeks   ? Status Achieved   ? Target Date 11/15/20   ?  ? PT LONG TERM GOAL #4  ? Title Pt will be able to complete work/home mobility tasks with laced ankle braced doff >50% for return to work specific foot wear.   ? Baseline Pt current needs to wear high cushion tennis shoes to work, brace donned 100% 9/6: laced brace doffed during ADLs the last 4 days. PT will wear brace during long work days.10/20: Pt is currently not using laced ankle braced and able to complete all work related tasks.   ? Time 6   ? Period Weeks   ? Status Achieved   ?  Target Date 11/15/20   ?  ? PT LONG TERM GOAL #5  ? Title Pt will improve FOTO score to predicted improvement value of 62 in regards to cervical impairments.   ? Baseline 52 10/20:47.  12/5: 63   ? Time 4   ? Period Weeks   ? Status Achieved   ? Target Date 12/31/20   ?  ? PT LONG TERM GOAL #6  ? Title Pt will improve worse cervical pain by atleast 3 NPS points to allow increased tolerance during work and home related ADLs.   ? Baseline NPS: 8/10 current, 7/10 best,  10/10 Worse. 10/20: Worse pain 7/10   ? Time 8   ? Period Weeks   ? Status Partially Met   ? Target Date 05/30/21   ?  ? PT LONG TERM GOAL #7  ? Title Pt will improve bilat UE MMT by atleast 1/2 grade to facilitate greater functional abilities with ADLs related to caregiving.   ? Baseline UE MMT R/L:   Shoulder Flexion: 4/4-  Shoulder abduction: 4/4-  Shoulder ER: 4/4  Shoulder IR: 4/4  Elbow ext: 5/5   Eblow flex: 5/5 10/20: UE MMT R/L:   Shoulder Flexion: 4/4  Shoulder abduction: 4/4 Shoulder ER: 4/4  Shoulder IR: 4/4  Elbow ext: 5/5   Eblow flex: 5/5   ? Time 8   ? Period Weeks   ? Status Partially Met   ? Target Date 05/30/21   ?  ? PT LONG TERM GOAL #8  ? Title Pt will improve functional R hand strength equal to L to promote return of hand useage during grooming and feeding.   ? Baseline Grip R/L: 76.1 / 33.9    Lateral prehension grip R/L: 14 / 4 10/20: Grip R/L: 62.0 / 41.4    Lateral prehension grip R/L: 13 / 6.  1/12:  R 69#/ L 49.8#   ? Time 8   ? Period Weeks   ? Status Partially Met   ? Target Date 05/30/21   ? ?  ?  ? ?  ? ? ? Plan -   ? ? Clinical Impression Statement Moderate B UT/ suboccipital tightness and palpable tenderness noted.  Increase cervical pain with L/R rotn. in seated and supine position. PT focused on manual stretches with static holds at pain tolerable end-range.  Several trigger points noted in L>R upper thoracic/ low cervical paraspinals.  Tx. focus on manual stretches/ STM in prone position.  Pt. continues to benefit  from TDN to suboccipital region and B UT trigger points. Pt. understands current HEP and importance of being aware of proper posture/ body mechanics with work-related tasks and household activities.   ? [place]

## 2021-05-16 ENCOUNTER — Ambulatory Visit: Payer: BC Managed Care – PPO | Admitting: Physical Therapy

## 2021-05-16 ENCOUNTER — Encounter: Payer: Self-pay | Admitting: Physical Therapy

## 2021-05-16 DIAGNOSIS — M2560 Stiffness of unspecified joint, not elsewhere classified: Secondary | ICD-10-CM

## 2021-05-16 DIAGNOSIS — R29898 Other symptoms and signs involving the musculoskeletal system: Secondary | ICD-10-CM | POA: Diagnosis not present

## 2021-05-16 DIAGNOSIS — M6281 Muscle weakness (generalized): Secondary | ICD-10-CM

## 2021-05-16 DIAGNOSIS — M25571 Pain in right ankle and joints of right foot: Secondary | ICD-10-CM | POA: Diagnosis not present

## 2021-05-16 DIAGNOSIS — M79601 Pain in right arm: Secondary | ICD-10-CM

## 2021-05-16 NOTE — Therapy (Signed)
?OUTPATIENT PHYSICAL THERAPY TREATMENT NOTE ? ? ?Patient Name: Amanda Humphrey ?MRN: [id number] ?DOB:[birth year], 56 y.o., female ?Today's Date: 05/18/2021 ? ?PCP: Crecencio Mc, MD ?REFERRING PROVIDER: Samara Deist, DPM ? ? ? ? PT End of Session - 05/16/21 0736   ? ? Visit Number 42   ? Number of Visits 48   ? Date for PT Re-Evaluation 05/30/21   ? Authorization - Visit Number 2   ? Authorization - Number of Visits 10  ?[id number] to [id number]  ? Activity Tolerance Patient tolerated treatment well;Patient limited by pain   ? Behavior During Therapy [facility] for tasks assessed/performed   ? ?  ?  ? ? ? ? ?Past Medical History:  ?Diagnosis Date  ? Allergy   ? Chronic kidney disease   ? stones  ? Fibrocystic breast disease 2013  ? Meniere disease   ? Migraines   ? ?Past Surgical History:  ?Procedure Laterality Date  ? ABDOMINAL HYSTERECTOMY  2011  ? laprascopic supracervical, [place]  ? APPENDECTOMY  1999  ? BREAST BIOPSY Right Nov 2012  ?  fibrocystic disease, [place]  ? CESAREAN SECTION    ? 4 total  ? CHOLECYSTECTOMY  2011  ? TONSILLECTOMY AND ADENOIDECTOMY  1975  ? ?Patient Active Problem List  ? Diagnosis Date Noted  ? H/O of hemilaminectomy 02/15/2019  ? 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[place]) 11/17/2018  ? Fatigue 09/23/2017  ? History of pneumonia 04/30/2016  ? Grief reaction 04/30/2016  ? Vitamin D deficiency 02/22/2015  ? Hyperlipidemia 01/01/2015  ? Visit for preventive health examination 12/04/2013  ? Lower extremity edema 09/02/2013  ? Essential hypertension 09/02/2013  ? History of cardiomyopathy 09/02/2013  ? SVT (supraventricular tachycardia) ([place]) 09/02/2013  ? Palpitations 09/02/2013  ? Contrast media allergy 03/10/2013  ? Fibrocystic breast disease   ? Morbid obesity ([place]) 02/05/2012  ? Meniere disease   ? ? ?REFERRING DIAG:  ?M79.18 (ICD-10-CM) - Myalgia, other site  ?E36.629 (ICD-10-CM) - Other specified postprocedural states  ?M54.12 (ICD-10-CM) - Radiculopathy, cervical region  ? ? ?THERAPY DIAG:  ?Joint  stiffness of spine ? ?Pain in right arm ? ?Muscle weakness (generalized) ? ?Ankle weakness ? ?Pain in right ankle and joints of right foot ? ?PERTINENT HISTORY: See evaluation ? ?PRECAUTIONS: N/A ? ?SUBJECTIVE:  Pt. Remains busy with work and home tasks.  Pt. Reports mid-thoracic tightness on R and L.  No muscle spasms.   ? ?PAIN:  ?Are you having pain? Yes: NPRS scale: >5/10 ?Pain location: Neck ?Pain description: aching/ stiff/ tight ?Aggravating factors: prolonged sitting/ activity ?Relieving factors: stretches/ PT ? ? ? ? ?TODAY'S TREATMENT:  ?Manual Therapy: ?  ?Seated STM to cervical/ upper thoracic paraspinals.  Reassessment of cervical AROM (all planes) ?  ?Prone STM to L and R upper trap, levator scapulae, rhomboid, teres minor, and infraspinatus.    ?  ?Seated thoracic rotation 2x each L/R.  Seated B shoulder flexion/ abduction (good posture).   ?  ?  ?Trigger Point Dry Needling (TDN) ?  ?Education performed with patient regarding potential benefit of TDN. Reviewed precautions and risks with patient. Reviewed special precautions/risks over lung fields which include pneumothorax. Reviewed signs and symptoms of pneumothorax and advised pt to go to ER immediately if these symptoms develop advise them of dry needling treatment. Pt provided verbal consent to treatment. TDN performed to bilateral suboccipital region/ mid-cervical paraspinals with 4, 0.30 x 50 L-type single needle placements on each side with 2 local twitch response (LTR). Pistoning technique utilized.  Improved pain-free motion following intervention.  Increase discomfort reported with suboccipital region (R>L).   Use of e-stim during TDN to B UT region (pulse rate 3 pps)- intensity of 40m.  Good tx. Tolerance.   ?  ?STM with Biofreeze to mid-thoracic/cervical musculature in prone at end of tx. Session.   ?  ? ? ?PATIENT EDUCATION: ?Education details: HEP ?Person educated: Patient ?Education method: Explanation, Demonstration, and  Handouts ?Education comprehension: verbalized understanding and returned demonstration ? ? ?HOME EXERCISE PROGRAM: ?[id number].  New HEP: [id number]  ? ? ? ? PT Long Term Goals -   ? ?  ? PT LONG TERM GOAL #1  ? Title Pt. will improve FOTO score to >62 to improve independence with ADLs *foot*   ? Baseline 42 9/6: 57 10/20:65   ? Time 6   ? Period Weeks   ? Status Achieved   ? Target Date 11/15/20   ?  ? PT LONG TERM GOAL #2  ? Title Pt will improve R ankle MMT to qual to L to faciliate increased stability for return to walks on the beach.   ? Baseline R/L: 3/5 Ankle dorsiflexion    5/25 bilat Sl calf raise Ankle plantarflexion  *pain with all MMT testing of the ankle/foot*  3/5 ankle inversion   3/5 ankle eversion 9/6: R/L: 4+/5 Ankle dorsiflexion    8/25 bilat Sl calf raise Ankle plantarflexion  *pain with all MMT testing of the ankle/foot*  4/5 ankle inversion   4/5 ankle eversion. 10/20: bilat Sl calf raise Ankle plantarflexion 8/25  *pain with all MMT testing of the ankle/foot*  4+/5 ankle inversion   4+/5 ankle eversion, 4+/5 ankle DF.   ? Time 6   ? Period Weeks   ? Status Achieved   ? Target Date 12/31/20   ?  ? PT LONG TERM GOAL #3  ? Title Pt will be able to complete 20 steps with R ankle pain >3/10 to facilitate greater mobility within the home.   ? Baseline 7-8/10 NPS 9/6: 2/10 at the lowest. 8 at the worse. Stair climbing is very difficult 10/20: Pt was able to complete >20 steps with 0/10.   ? Time 6   ? Period Weeks   ? Status Achieved   ? Target Date 11/15/20   ?  ? PT LONG TERM GOAL #4  ? Title Pt will be able to complete work/home mobility tasks with laced ankle braced doff >50% for return to work specific foot wear.   ? Baseline Pt current needs to wear high cushion tennis shoes to work, brace donned 100% 9/6: laced brace doffed during ADLs the last 4 days. PT will wear brace during long work days.10/20: Pt is currently not using laced ankle braced and able to complete all work related tasks.   ? Time  6   ? Period Weeks   ? Status Achieved   ? Target Date 11/15/20   ?  ? PT LONG TERM GOAL #5  ? Title Pt will improve FOTO score to predicted improvement value of 62 in regards to cervical impairments.   ? Baseline 52 10/20:47.  12/5: 63   ? Time 4   ? Period Weeks   ? Status Achieved   ? Target Date 12/31/20   ?  ? PT LONG TERM GOAL #6  ? Title Pt will improve worse cervical pain by atleast 3 NPS points to allow increased tolerance during work and home related ADLs.   ? Baseline NPS:  8/10 current, 7/10 best, 10/10 Worse. 10/20: Worse pain 7/10   ? Time 8   ? Period Weeks   ? Status Partially Met   ? Target Date 05/30/21   ?  ? PT LONG TERM GOAL #7  ? Title Pt will improve bilat UE MMT by atleast 1/2 grade to facilitate greater functional abilities with ADLs related to caregiving.   ? Baseline UE MMT R/L:   Shoulder Flexion: 4/4-  Shoulder abduction: 4/4-  Shoulder ER: 4/4  Shoulder IR: 4/4  Elbow ext: 5/5   Eblow flex: 5/5 10/20: UE MMT R/L:   Shoulder Flexion: 4/4  Shoulder abduction: 4/4 Shoulder ER: 4/4  Shoulder IR: 4/4  Elbow ext: 5/5   Eblow flex: 5/5   ? Time 8   ? Period Weeks   ? Status Partially Met   ? Target Date 05/30/21   ?  ? PT LONG TERM GOAL #8  ? Title Pt will improve functional R hand strength equal to L to promote return of hand useage during grooming and feeding.   ? Baseline Grip R/L: 76.1 / 33.9    Lateral prehension grip R/L: 14 / 4 10/20: Grip R/L: 62.0 / 41.4    Lateral prehension grip R/L: 13 / 6.  1/12:  R 69#/ L 49.8#   ? Time 8   ? Period Weeks   ? Status Partially Met   ? Target Date 05/30/21   ? ?  ?  ? ?  ? ? ? Plan -   ? ? Clinical Impression Statement Pt. Had appt. With massage therapist last week and PT focused on manual stretches/ STM and TDN to decrease trigger points today.  Several trigger points noted in L and R mid-upper thoracic/ low cervical paraspinals.  Tx. focus on manual stretches/ STM in prone position.  Pt. continues to benefit from TDN to suboccipital region and B  UT trigger points. Pt. understands current HEP and importance of being aware of proper posture/ body mechanics with work-related tasks and household activities.   ? Personal Factors and Comorbidities Comorbidity 2;Prof

## 2021-05-28 ENCOUNTER — Ambulatory Visit: Payer: BC Managed Care – PPO | Attending: Podiatry | Admitting: Physical Therapy

## 2021-05-28 ENCOUNTER — Encounter: Payer: Self-pay | Admitting: Physical Therapy

## 2021-05-28 DIAGNOSIS — M6281 Muscle weakness (generalized): Secondary | ICD-10-CM | POA: Insufficient documentation

## 2021-05-28 DIAGNOSIS — S96919A Strain of unspecified muscle and tendon at ankle and foot level, unspecified foot, initial encounter: Secondary | ICD-10-CM | POA: Diagnosis not present

## 2021-05-28 DIAGNOSIS — M25571 Pain in right ankle and joints of right foot: Secondary | ICD-10-CM | POA: Diagnosis not present

## 2021-05-28 DIAGNOSIS — M79601 Pain in right arm: Secondary | ICD-10-CM | POA: Insufficient documentation

## 2021-05-28 DIAGNOSIS — M2560 Stiffness of unspecified joint, not elsewhere classified: Secondary | ICD-10-CM | POA: Insufficient documentation

## 2021-05-28 DIAGNOSIS — S93409A Sprain of unspecified ligament of unspecified ankle, initial encounter: Secondary | ICD-10-CM | POA: Diagnosis not present

## 2021-05-28 DIAGNOSIS — R29898 Other symptoms and signs involving the musculoskeletal system: Secondary | ICD-10-CM | POA: Diagnosis not present

## 2021-05-28 NOTE — Therapy (Addendum)
OUTPATIENT PHYSICAL THERAPY TREATMENT NOTE   Patient Name: Amanda Humphrey MRN: [id number] DOB:02-Apr-1965, 56 y.o., female Today's Date: 05/28/2021  PCP: Crecencio Mc, MD REFERRING PROVIDER: Samara Deist, DPM     PT End of Session - 05/16/21 0736     Visit Number 43    Number of Visits 46    Date for PT Re-Evaluation 05/30/21    Authorization - Visit Number 3    Authorization - Number of Visits 10  [phone] to [phone]   Activity Tolerance Patient tolerated treatment well;Patient limited by pain    Behavior During Therapy [facility] for tasks assessed/performed            Past Medical History:  Diagnosis Date   Allergy    Chronic kidney disease    stones   Fibrocystic breast disease 2013   Meniere disease    Migraines    Past Surgical History:  Procedure Laterality Date   ABDOMINAL HYSTERECTOMY  2011   laprascopic supracervical, [place]   APPENDECTOMY  1999   BREAST BIOPSY Right Nov 2012    fibrocystic disease, [place]   CESAREAN SECTION     4 total   CHOLECYSTECTOMY  2011   [place]   Patient Active Problem List   Diagnosis Date Noted   H/O of hemilaminectomy 02/15/2019   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[place]) 11/17/2018   Fatigue 09/23/2017   History of pneumonia 04/30/2016   Grief reaction 04/30/2016   Vitamin D deficiency 02/22/2015   Hyperlipidemia 01/01/2015   Visit for preventive health examination 12/04/2013   Lower extremity edema 09/02/2013   Essential hypertension 09/02/2013   History of cardiomyopathy 09/02/2013   SVT (supraventricular tachycardia) ([place]) 09/02/2013   Palpitations 09/02/2013   Contrast media allergy 03/10/2013   Fibrocystic breast disease    Morbid obesity ([place]) 02/05/2012   Meniere disease     REFERRING DIAG:  M79.18 (ICD-10-CM) - Myalgia, other site  Z98.890 (ICD-10-CM) - Other specified postprocedural states  M54.12 (ICD-10-CM) - Radiculopathy, cervical region    THERAPY DIAG:  Joint  stiffness of spine  Pain in right arm  Muscle weakness (generalized)  Ankle weakness  Pain in right ankle and joints of right foot  Sprain and strain of ankle  PERTINENT HISTORY: See evaluation  PRECAUTIONS: N/A  SUBJECTIVE:  Pt. States she was sore in upper back/ neck after last tx.  Pt. Has had an increase in work stress and scheduled for several work trips this month.    PAIN:  Are you having pain? Yes: NPRS scale: >5/10 Pain location: Neck Pain description: aching/ stiff/ tight Aggravating factors: prolonged sitting/ activity Relieving factors: stretches/ PT     TODAY'S TREATMENT:  Manual Therapy:   Seated STM to cervical/ upper thoracic paraspinals.  Reassessment of cervical AROM (all planes)  Supine L/R UT and levator stretches 3x each with static holds.    Supine manual suboccipital release 3x with hold (several trigger points palpated)  Supine L/R shoulder AAROM flexion OP with STM   Prone STM to L and R upper trap, levator scapulae, rhomboid, teres minor, and infraspinatus.      Seated thoracic rotation 2x each L/R.  Seated B shoulder flexion/ abduction (good posture).       Trigger Point Dry Needling (TDN)   Education performed with patient regarding potential benefit of TDN. Reviewed precautions and risks with patient. Reviewed special precautions/risks over lung fields which include pneumothorax. Reviewed signs and symptoms of pneumothorax and advised pt to  go to ER immediately if these symptoms develop advise them of dry needling treatment. Pt provided verbal consent to treatment. TDN performed to bilateral suboccipital region/ mid-cervical paraspinals with 4, 0.30 x 50 L-type single needle placements on each side with 2 local twitch response (LTR). Pistoning technique utilized. Improved pain-free motion following intervention.  Increase discomfort reported with suboccipital region (R>L).   No e-stim today.  Good tx. Tolerance.  No Biofreeze today.       PATIENT EDUCATION: Education details: HEP Person educated: Patient Education method: Explanation, Media planner, and Handouts Education comprehension: verbalized understanding and returned demonstration   HOME EXERCISE PROGRAM: [id number].  New HEP: [id number]      PT Long Term Goals -       PT LONG TERM GOAL #1   Title Pt. will improve FOTO score to >62 to improve independence with ADLs *foot*    Baseline 42 9/6: 57 10/20:65    Time 6    Period Weeks    Status Achieved    Target Date 11/15/20      PT LONG TERM GOAL #2   Title Pt will improve R ankle MMT to qual to L to faciliate increased stability for return to walks on the beach.    Baseline R/L: 3/5 Ankle dorsiflexion    5/25 bilat Sl calf raise Ankle plantarflexion  *pain with all MMT testing of the ankle/foot*  3/5 ankle inversion   3/5 ankle eversion 9/6: R/L: 4+/5 Ankle dorsiflexion    8/25 bilat Sl calf raise Ankle plantarflexion  *pain with all MMT testing of the ankle/foot*  4/5 ankle inversion   4/5 ankle eversion. 10/20: bilat Sl calf raise Ankle plantarflexion 8/25  *pain with all MMT testing of the ankle/foot*  4+/5 ankle inversion   4+/5 ankle eversion, 4+/5 ankle DF.    Time 6    Period Weeks    Status Achieved    Target Date 12/31/20      PT LONG TERM GOAL #3   Title Pt will be able to complete 20 steps with R ankle pain >3/10 to facilitate greater mobility within the home.    Baseline 7-8/10 NPS 9/6: 2/10 at the lowest. 8 at the worse. Stair climbing is very difficult 10/20: Pt was able to complete >20 steps with 0/10.    Time 6    Period Weeks    Status Achieved    Target Date 11/15/20      PT LONG TERM GOAL #4   Title Pt will be able to complete work/home mobility tasks with laced ankle braced doff >50% for return to work specific foot wear.    Baseline Pt current needs to wear high cushion tennis shoes to work, brace donned 100% 9/6: laced brace doffed during ADLs the last 4 days. PT will wear brace  during long work days.10/20: Pt is currently not using laced ankle braced and able to complete all work related tasks.    Time 6    Period Weeks    Status Achieved    Target Date 11/15/20      PT LONG TERM GOAL #5   Title Pt will improve FOTO score to predicted improvement value of 62 in regards to cervical impairments.    Baseline 52 10/20:47.  12/5: 63    Time 4    Period Weeks    Status Achieved    Target Date 12/31/20      PT LONG TERM GOAL #6   Title Pt will improve worse  cervical pain by atleast 3 NPS points to allow increased tolerance during work and home related ADLs.    Baseline NPS: 8/10 current, 7/10 best, 10/10 Worse. 10/20: Worse pain 7/10    Time 8    Period Weeks    Status Partially Met    Target Date 05/30/21      PT LONG TERM GOAL #7   Title Pt will improve bilat UE MMT by atleast 1/2 grade to facilitate greater functional abilities with ADLs related to caregiving.    Baseline UE MMT R/L:   Shoulder Flexion: 4/4-  Shoulder abduction: 4/4-  Shoulder ER: 4/4  Shoulder IR: 4/4  Elbow ext: 5/5   Eblow flex: 5/5 10/20: UE MMT R/L:   Shoulder Flexion: 4/4  Shoulder abduction: 4/4 Shoulder ER: 4/4  Shoulder IR: 4/4  Elbow ext: 5/5   Eblow flex: 5/5    Time 8    Period Weeks    Status Partially Met    Target Date 05/30/21      PT LONG TERM GOAL #8   Title Pt will improve functional R hand strength equal to L to promote return of hand useage during grooming and feeding.    Baseline Grip R/L: 76.1 / 33.9    Lateral prehension grip R/L: 14 / 4 10/20: Grip R/L: 62.0 / 41.4    Lateral prehension grip R/L: 13 / 6.  1/12:  R 69#/ L 49.8#    Time 8    Period Weeks    Status Partially Met    Target Date 05/30/21              Plan -     Clinical Impression Statement Tx. focus on manual stretches/ STM in prone position.  Pt. continues to benefit from TDN to suboccipital region and B UT trigger points. Pt. understands current HEP and importance of being aware of proper  posture/ body mechanics with work-related tasks and household activities. Pt. Will focus on stretches while out of town on work-related trips.  Pt. Instructed to contact PT if any questions or concerns.     Personal Factors and Comorbidities Comorbidity 2;Profession;Past/Current Experience    Comorbidities Hypertension, Obesity    Examination-Activity Limitations Squat;Locomotion Level    Examination-Participation Restrictions Occupation;Cleaning;Laundry    Stability/Clinical Decision Making Evolving/Moderate complexity    Clinical Decision Making Moderate    Rehab Potential Good    PT Frequency Biweekly    PT Duration 8 weeks    PT Treatment/Interventions ADLs/Self Care Home Management;Aquatic Therapy;Biofeedback;Cryotherapy;Electrical Stimulation;Moist Heat;Ultrasound;Functional mobility training;Therapeutic activities;Therapeutic exercise;Balance training;Neuromuscular re-education;Patient/family education;Manual techniques;Scar mobilization;Passive range of motion;Dry needling;Energy conservation;Taping;Gait training;Stair training;DME Instruction;Spinal Manipulations;Joint Manipulations    PT Next Visit Plan Progress strengthening exercise.  CHECK GOALS/ discuss POC.     PT Home Exercise Plan [id number].  New HEP: [id number]    Consulted and Agree with Plan of Care Patient             Pura Spice, PT, DPT # [phone] 05/28/2021, 12:01 PM

## 2021-06-12 ENCOUNTER — Ambulatory Visit: Payer: BC Managed Care – PPO | Admitting: Physical Therapy

## 2021-06-12 DIAGNOSIS — S96919A Strain of unspecified muscle and tendon at ankle and foot level, unspecified foot, initial encounter: Secondary | ICD-10-CM | POA: Diagnosis not present

## 2021-06-12 DIAGNOSIS — M79601 Pain in right arm: Secondary | ICD-10-CM

## 2021-06-12 DIAGNOSIS — S93409A Sprain of unspecified ligament of unspecified ankle, initial encounter: Secondary | ICD-10-CM | POA: Diagnosis not present

## 2021-06-12 DIAGNOSIS — M25571 Pain in right ankle and joints of right foot: Secondary | ICD-10-CM | POA: Diagnosis not present

## 2021-06-12 DIAGNOSIS — M2560 Stiffness of unspecified joint, not elsewhere classified: Secondary | ICD-10-CM | POA: Diagnosis not present

## 2021-06-12 DIAGNOSIS — M6281 Muscle weakness (generalized): Secondary | ICD-10-CM | POA: Diagnosis not present

## 2021-06-12 DIAGNOSIS — R29898 Other symptoms and signs involving the musculoskeletal system: Secondary | ICD-10-CM | POA: Diagnosis not present

## 2021-06-13 ENCOUNTER — Encounter: Payer: BC Managed Care – PPO | Admitting: Physical Therapy

## 2021-06-13 NOTE — Therapy (Addendum)
OUTPATIENT PHYSICAL THERAPY TREATMENT NOTE   Patient Name: Amanda Humphrey MRN: [id number] DOB:05/18/1965, 56 y.o., female Today's Date: 06/13/2021  PCP: Crecencio Mc, MD REFERRING PROVIDER: Samara Deist, DPM     PT End of Session - 05/16/21 0736     Visit Number 44    Number of Visits 14   Date for PT Re-Evaluation 08/07/21    Authorization - Visit Number 4    Authorization - Number of Visits 10  [id number] to [phone]   Activity Tolerance Patient tolerated treatment well;Patient limited by pain    Behavior During Therapy [facility] for tasks assessed/performed            Past Medical History:  Diagnosis Date   Allergy    Chronic kidney disease    stones   Fibrocystic breast disease 2013   Meniere disease    Migraines    Past Surgical History:  Procedure Laterality Date   ABDOMINAL HYSTERECTOMY  2011   laprascopic supracervical, [place]   APPENDECTOMY  1999   BREAST BIOPSY Right Nov 2012    fibrocystic disease, [place]   CESAREAN SECTION     4 total   CHOLECYSTECTOMY  2011   [place]   Patient Active Problem List   Diagnosis Date Noted   H/O of hemilaminectomy 02/15/2019   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[place]) 11/17/2018   Fatigue 09/23/2017   History of pneumonia 04/30/2016   Grief reaction 04/30/2016   Vitamin D deficiency 02/22/2015   Hyperlipidemia 01/01/2015   Visit for preventive health examination 12/04/2013   Lower extremity edema 09/02/2013   Essential hypertension 09/02/2013   History of cardiomyopathy 09/02/2013   SVT (supraventricular tachycardia) ([place]) 09/02/2013   Palpitations 09/02/2013   Contrast media allergy 03/10/2013   Fibrocystic breast disease    Morbid obesity ([place]) 02/05/2012   Meniere disease     REFERRING DIAG:  M79.18 (ICD-10-CM) - Myalgia, other site  Z98.890 (ICD-10-CM) - Other specified postprocedural states  M54.12 (ICD-10-CM) - Radiculopathy, cervical region    THERAPY DIAG:  Joint  stiffness of spine  Pain in right arm  Muscle weakness (generalized)  PERTINENT HISTORY: See evaluation  PRECAUTIONS: N/A  SUBJECTIVE:  Pt. Has been busy with work/ travel this month and has trip to [place] this weekend and an upcoming trip to [facility], [place].  Pt. Reports tightness in upper back/ shoulders.    PAIN:  Are you having pain? Yes: NPRS scale: 5/10 Pain location: Neck Pain description: aching/ stiff/ tight Aggravating factors: prolonged sitting/ activity Relieving factors: stretches/ PT     TODAY'S TREATMENT:  Manual Therapy:   Seated STM to cervical/ upper thoracic paraspinals.   Supine L/R UT and levator stretches 3x each with static holds.    Supine/prone manual suboccipital release 3x with hold    Prone STM to L and R upper trap, levator scapulae, rhomboid, teres minor, and infraspinatus.      Seated thoracic rotation 2x each L/R.   Pt. Reports discomfort/ aching in mid-back.     Trigger Point Dry Needling (TDN)   Education performed with patient regarding potential benefit of TDN. Reviewed precautions and risks with patient. Reviewed special precautions/risks over lung fields which include pneumothorax. Reviewed signs and symptoms of pneumothorax and advised pt to go to ER immediately if these symptoms develop advise them of dry needling treatment. Pt provided verbal consent to treatment. TDN performed to bilateral suboccipital region/ mid-cervical paraspinals with 5, 0.30 x 50 L-type single needle placements  on each side with 2 local twitch response (LTR). Pistoning technique utilized. Improved pain-free motion following intervention.  Increase discomfort reported with suboccipital region (R>L).  Good tx. Tolerance.  Applied Biofreeze to upper back/ neck after tx.     PATIENT EDUCATION: Education details: HEP Person educated: Patient Education method: Explanation, Media planner, and Handouts Education comprehension: verbalized understanding and returned  demonstration   HOME EXERCISE PROGRAM: [id number].  New HEP: [id number]          PT LONG TERM GOAL #6   Title Pt will improve worse cervical pain by atleast 3 NPS points to allow increased tolerance during work and home related ADLs.    Baseline NPS: 8/10 current, 7/10 best, 10/10 Worse. 10/20: Worse pain 7/10    Time 8    Period Weeks    Status Partially Met    Target Date 07/012/23      PT LONG TERM GOAL #7   Title Pt will improve bilat UE MMT by atleast 1/2 grade to facilitate greater functional abilities with ADLs related to caregiving.    Baseline UE MMT R/L:   Shoulder Flexion: 4/4-  Shoulder abduction: 4/4-  Shoulder ER: 4/4  Shoulder IR: 4/4  Elbow ext: 5/5   Eblow flex: 5/5 10/20: UE MMT R/L:   Shoulder Flexion: 4/4  Shoulder abduction: 4/4 Shoulder ER: 4/4  Shoulder IR: 4/4  Elbow ext: 5/5   Eblow flex: 5/5    Time 8    Period Weeks    Status Partially Met    Target Date 08/07/21      PT LONG TERM GOAL #8   Title Pt will improve functional R hand strength equal to L to promote return of hand useage during grooming and feeding.    Baseline Grip R/L: 76.1 / 33.9    Lateral prehension grip R/L: 14 / 4 10/20: Grip R/L: 62.0 / 41.4    Lateral prehension grip R/L: 13 / 6.  1/12:  R 69#/ L 49.8#    Time 8    Period Weeks    Status Partially Met    Target Date 08/07/21            Plan -     Clinical Impression Statement Tx. focus on manual stretches/ STM in prone position.  Pt. continues to benefit from TDN to suboccipital region and B UT trigger points. Pt. understands current HEP and importance of being aware of proper posture/ body mechanics with work-related tasks and household activities. Pt. Will focus on stretches while out of town on work-related trips.  Pt. Instructed to contact PT if any questions or concerns.     Personal Factors and Comorbidities Comorbidity 2;Profession;Past/Current Experience    Comorbidities Hypertension, Obesity    Examination-Activity  Limitations Squat;Locomotion Level    Examination-Participation Restrictions Occupation;Cleaning;Laundry    Stability/Clinical Decision Making Evolving/Moderate complexity    Clinical Decision Making Moderate    Rehab Potential Good    PT Frequency 1x/week   PT Duration 8 weeks    PT Treatment/Interventions ADLs/Self Care Home Management;Aquatic Therapy;Biofeedback;Cryotherapy;Electrical Stimulation;Moist Heat;Ultrasound;Functional mobility training;Therapeutic activities;Therapeutic exercise;Balance training;Neuromuscular re-education;Patient/family education;Manual techniques;Scar mobilization;Passive range of motion;Dry needling;Energy conservation;Taping;Gait training;Stair training;DME Instruction;Spinal Manipulations;Joint Manipulations    PT Next Visit Plan Progress strengthening exercise.     PT Home Exercise Plan [id number].   HEP: [id number]    Consulted and Agree with Plan of Care Patient          Pura Spice, PT, DPT # [phone] 06/13/2021, 11:07 AM

## 2021-06-27 ENCOUNTER — Ambulatory Visit: Payer: BC Managed Care – PPO | Attending: Podiatry | Admitting: Physical Therapy

## 2021-06-27 DIAGNOSIS — M2560 Stiffness of unspecified joint, not elsewhere classified: Secondary | ICD-10-CM | POA: Diagnosis not present

## 2021-06-27 DIAGNOSIS — M6281 Muscle weakness (generalized): Secondary | ICD-10-CM

## 2021-06-27 DIAGNOSIS — M79601 Pain in right arm: Secondary | ICD-10-CM | POA: Diagnosis not present

## 2021-06-27 DIAGNOSIS — M25571 Pain in right ankle and joints of right foot: Secondary | ICD-10-CM

## 2021-06-27 DIAGNOSIS — R42 Dizziness and giddiness: Secondary | ICD-10-CM | POA: Insufficient documentation

## 2021-06-27 DIAGNOSIS — S93409A Sprain of unspecified ligament of unspecified ankle, initial encounter: Secondary | ICD-10-CM | POA: Insufficient documentation

## 2021-06-27 DIAGNOSIS — R29898 Other symptoms and signs involving the musculoskeletal system: Secondary | ICD-10-CM

## 2021-06-27 DIAGNOSIS — S96919A Strain of unspecified muscle and tendon at ankle and foot level, unspecified foot, initial encounter: Secondary | ICD-10-CM | POA: Diagnosis not present

## 2021-06-28 NOTE — Therapy (Signed)
OUTPATIENT PHYSICAL THERAPY TREATMENT NOTE   Patient Name: Amanda Humphrey MRN: [id number] DOB:[birth year], 56 y.o., female Today's Date: 06/28/2021  PCP: Crecencio Mc, MD REFERRING PROVIDER: Samara Deist, DPM     PT End of Session - 05/16/21 0736     Visit Number 45    Number of Visits 34   Date for PT Re-Evaluation 08/07/21    Authorization - Visit Number 5    Authorization - Number of Visits 10  [id number] [phone]   Activity Tolerance Patient tolerated treatment well;Patient limited by pain    Behavior During Therapy [facility] for tasks assessed/performed            Past Medical History:  Diagnosis Date   Allergy    Chronic kidney disease    stones   Fibrocystic breast disease 2013   Meniere disease    Migraines    Past Surgical History:  Procedure Laterality Date   ABDOMINAL HYSTERECTOMY  2011   laprascopic supracervical, [place]   APPENDECTOMY  1999   BREAST BIOPSY Right Nov 2012    fibrocystic disease, [place]   CESAREAN SECTION     4 total   CHOLECYSTECTOMY  2011   [place]   Patient Active Problem List   Diagnosis Date Noted   H/O of hemilaminectomy 02/15/2019   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[place]) 11/17/2018   Fatigue 09/23/2017   History of pneumonia 04/30/2016   Grief reaction 04/30/2016   Vitamin D deficiency 02/22/2015   Hyperlipidemia 01/01/2015   Visit for preventive health examination 12/04/2013   Lower extremity edema 09/02/2013   Essential hypertension 09/02/2013   History of cardiomyopathy 09/02/2013   SVT (supraventricular tachycardia) ([place]) 09/02/2013   Palpitations 09/02/2013   Contrast media allergy 03/10/2013   Fibrocystic breast disease    Morbid obesity ([place]) 02/05/2012   Meniere disease     REFERRING DIAG:  M79.18 (ICD-10-CM) - Myalgia, other site  Z98.890 (ICD-10-CM) - Other specified postprocedural states  M54.12 (ICD-10-CM) - Radiculopathy, cervical region    THERAPY DIAG:  Joint  stiffness of spine  Pain in right arm  Muscle weakness (generalized)  Ankle weakness  Pain in right ankle and joints of right foot  PERTINENT HISTORY: See evaluation  PRECAUTIONS: N/A  SUBJECTIVE:  Pt. Has been busy at work/home and reports a fall at home last Friday morning while descending stairs.  Pt. Presents with marked ecchymosis on low back (central/ L lumbar region)- tenderness noted.  Pt. Reports no fractures, just soreness and stiffness.  PAIN:  Are you having pain? Yes: NPRS scale: 5/10 Pain location: Neck Pain description: aching/ stiff/ tight Aggravating factors: prolonged sitting/ activity Relieving factors: stretches/ PT     TODAY'S TREATMENT:  Manual Therapy:   Assessment of lumbar ecchymosis in prone (tenderness noted, esp. About spinous process).  Applied ice pack to low back during prone manual tx./ dry needling.    Prone STM to L and R upper trap, levator scapulae, rhomboid, teres minor, and infraspinatus.     Supine L/R UT and levator stretches 3x each with static holds.    Supine/prone manual suboccipital release 3x with hold   Seated STM to cervical/ upper thoracic paraspinals.  Reassessment of cervical AROM (all planes).         Trigger Point Dry Needling (TDN)   Education performed with patient regarding potential benefit of TDN. Reviewed precautions and risks with patient. Reviewed special precautions/risks over lung fields which include pneumothorax. Reviewed signs and symptoms of  pneumothorax and advised pt to go to ER immediately if these symptoms develop advise them of dry needling treatment. Pt provided verbal consent to treatment. TDN performed to bilateral suboccipital region/ mid-cervical paraspinals with 5, 0.30 x 50 L-type single needle placements on each side with 2 local twitch response (LTR). Pistoning technique utilized. Improved pain-free motion following intervention.  Increase discomfort reported with suboccipital region.  Good tx.  Tolerance.  Applied Biofreeze to upper back/ neck after tx.     PATIENT EDUCATION: Education details: HEP Person educated: Patient Education method: Explanation, Media planner, and Handouts Education comprehension: verbalized understanding and returned demonstration   HOME EXERCISE PROGRAM: [id number].  New HEP: [id number]          PT LONG TERM GOAL #6   Title Pt will improve worse cervical pain by atleast 3 NPS points to allow increased tolerance during work and home related ADLs.    Baseline NPS: 8/10 current, 7/10 best, 10/10 Worse. 10/20: Worse pain 7/10    Time 8    Period Weeks    Status Partially Met    Target Date 07/012/23      PT LONG TERM GOAL #7   Title Pt will improve bilat UE MMT by atleast 1/2 grade to facilitate greater functional abilities with ADLs related to caregiving.    Baseline UE MMT R/L:   Shoulder Flexion: 4/4-  Shoulder abduction: 4/4-  Shoulder ER: 4/4  Shoulder IR: 4/4  Elbow ext: 5/5   Eblow flex: 5/5 10/20: UE MMT R/L:   Shoulder Flexion: 4/4  Shoulder abduction: 4/4 Shoulder ER: 4/4  Shoulder IR: 4/4  Elbow ext: 5/5   Eblow flex: 5/5    Time 8    Period Weeks    Status Partially Met    Target Date 08/07/21      PT LONG TERM GOAL #8   Title Pt will improve functional R hand strength equal to L to promote return of hand useage during grooming and feeding.    Baseline Grip R/L: 76.1 / 33.9    Lateral prehension grip R/L: 14 / 4 10/20: Grip R/L: 62.0 / 41.4    Lateral prehension grip R/L: 13 / 6.  1/12:  R 69#/ L 49.8#    Time 8    Period Weeks    Status Partially Met    Target Date 08/07/21            Plan -     Clinical Impression Statement Tx. focus on manual stretches/ STM in prone position.  Pt. continues to benefit from TDN to suboccipital region and B UT trigger points. Pt. understands current HEP and importance of being aware of proper posture/ body mechanics with work-related tasks and household activities. Pt. Will focus on stretches  while out of town on work-related trips.  Pt. Instructed to contact PT if any questions or concerns.     Personal Factors and Comorbidities Comorbidity 2;Profession;Past/Current Experience    Comorbidities Hypertension, Obesity    Examination-Activity Limitations Squat;Locomotion Level    Examination-Participation Restrictions Occupation;Cleaning;Laundry    Stability/Clinical Decision Making Evolving/Moderate complexity    Clinical Decision Making Moderate    Rehab Potential Good    PT Frequency 1x/week   PT Duration 8 weeks    PT Treatment/Interventions ADLs/Self Care Home Management;Aquatic Therapy;Biofeedback;Cryotherapy;Electrical Stimulation;Moist Heat;Ultrasound;Functional mobility training;Therapeutic activities;Therapeutic exercise;Balance training;Neuromuscular re-education;Patient/family education;Manual techniques;Scar mobilization;Passive range of motion;Dry needling;Energy conservation;Taping;Gait training;Stair training;DME Instruction;Spinal Manipulations;Joint Manipulations    PT Next Visit Plan Progress strengthening exercise.  Discuss trip to  [place]   PT [place].   HEP: [id number]    Consulted and Agree with Plan of Care Patient          Pura Spice, PT, DPT # [phone] 06/28/2021, 1:33 PM

## 2021-07-11 ENCOUNTER — Ambulatory Visit: Payer: BC Managed Care – PPO | Admitting: Physical Therapy

## 2021-07-11 DIAGNOSIS — S93409A Sprain of unspecified ligament of unspecified ankle, initial encounter: Secondary | ICD-10-CM | POA: Diagnosis not present

## 2021-07-11 DIAGNOSIS — M6281 Muscle weakness (generalized): Secondary | ICD-10-CM

## 2021-07-11 DIAGNOSIS — M79601 Pain in right arm: Secondary | ICD-10-CM | POA: Diagnosis not present

## 2021-07-11 DIAGNOSIS — R29898 Other symptoms and signs involving the musculoskeletal system: Secondary | ICD-10-CM

## 2021-07-11 DIAGNOSIS — R42 Dizziness and giddiness: Secondary | ICD-10-CM | POA: Diagnosis not present

## 2021-07-11 DIAGNOSIS — M25571 Pain in right ankle and joints of right foot: Secondary | ICD-10-CM | POA: Diagnosis not present

## 2021-07-11 DIAGNOSIS — S96919A Strain of unspecified muscle and tendon at ankle and foot level, unspecified foot, initial encounter: Secondary | ICD-10-CM | POA: Diagnosis not present

## 2021-07-11 DIAGNOSIS — M2560 Stiffness of unspecified joint, not elsewhere classified: Secondary | ICD-10-CM | POA: Diagnosis not present

## 2021-07-11 NOTE — Therapy (Addendum)
OUTPATIENT PHYSICAL THERAPY TREATMENT NOTE   Patient Name: Amanda Humphrey MRN: [id number] DOB:1965/11/17, 56 y.o., female Today's Date: 07/11/2021  PCP: Crecencio Mc, MD REFERRING PROVIDER: Samara Deist, DPM     PT End of Session - 05/16/21 0736     Visit Number 46    Number of Visits 15   Date for PT Re-Evaluation 08/07/21    Authorization - Visit Number 6   Authorization - Number of Visits 10  [id number] to [phone]  (44 mins)   Activity Tolerance Patient tolerated treatment well;Patient limited by pain    Behavior During Therapy [facility] for tasks assessed/performed            Past Medical History:  Diagnosis Date   Allergy    Chronic kidney disease    stones   Fibrocystic breast disease 2013   Meniere disease    Migraines    Past Surgical History:  Procedure Laterality Date   ABDOMINAL HYSTERECTOMY  2011   laprascopic supracervical, [place]   APPENDECTOMY  1999   BREAST BIOPSY Right Nov 2012    fibrocystic disease, [place]   CESAREAN SECTION     4 total   CHOLECYSTECTOMY  2011   [place]   Patient Active Problem List   Diagnosis Date Noted   H/O of hemilaminectomy 02/15/2019   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[place]) 11/17/2018   Fatigue 09/23/2017   History of pneumonia 04/30/2016   Grief reaction 04/30/2016   Vitamin D deficiency 02/22/2015   Hyperlipidemia 01/01/2015   Visit for preventive health examination 12/04/2013   Lower extremity edema 09/02/2013   Essential hypertension 09/02/2013   History of cardiomyopathy 09/02/2013   SVT (supraventricular tachycardia) ([place]) 09/02/2013   Palpitations 09/02/2013   Contrast media allergy 03/10/2013   Fibrocystic breast disease    Morbid obesity ([place]) 02/05/2012   Meniere disease     REFERRING DIAG:  M79.18 (ICD-10-CM) - Myalgia, other site  Z98.890 (ICD-10-CM) - Other specified postprocedural states  M54.12 (ICD-10-CM) - Radiculopathy, cervical region    THERAPY DIAG:   Joint stiffness of spine  Pain in right ankle and joints of right foot  Pain in right arm  Sprain and strain of ankle  Muscle weakness (generalized)  Ankle weakness  PERTINENT HISTORY: See evaluation  PRECAUTIONS: N/A  SUBJECTIVE:  Pt stated that she is experiencing stiffness in her neck and upper back this morning, primarily in upper/mid traps. Ecchymosis from her prior fall has dissipated 75%, but she still experiences pain in her lower back from a fall down the stairs 3 wks ago. Pt currently has 4/10 pain, which increases with quick movement of c-spine. Pt stated that her neck has been cracking frequently, but the cracks do not increase discomfort.   PAIN:  Are you having pain? Yes: NPRS scale: 4/10 Pain location: Neck Pain description: aching/ stiff/ tight Aggravating factors: prolonged sitting/ activity Relieving factors: stretches/ PT     TODAY'S TREATMENT:    07/11/21:  Manual Therapy:  Assessment of B shoulder/ upper extremity ROM  Supine STM to L and R upper trap, levator scapulae  Supine cervical grade II distraction - 30 s hold x 4    Supine manual suboccipital release 3x with 30 hold   PROM of c-spine, overpressure at end range - 20 s hold x 4         Trigger Point Dry Needling (TDN)   Education performed with patient regarding potential benefit of TDN. Reviewed precautions and risks with  patient. Reviewed special precautions/risks over lung fields which include pneumothorax. Reviewed signs and symptoms of pneumothorax and advised pt to go to ER immediately if these symptoms develop advise them of dry needling treatment. Pt provided verbal consent to treatment. TDN performed to bilateral suboccipital region/ mid-cervical paraspinals with 5, 0.30 x 50 L-type single needle placements on each side with 2 local twitch response (LTR). Pistoning technique utilized. Improved pain-free motion following intervention.  Increase discomfort reported with suboccipital  region.  Good tx. Tolerance.  Applied Biofreeze to upper back/ neck after tx.     PATIENT EDUCATION: Education details: HEP Person educated: Patient Education method: Explanation, Media planner, and Handouts Education comprehension: verbalized understanding and returned demonstration   HOME EXERCISE PROGRAM: [id number].  New HEP: [id number]          PT LONG TERM GOAL #6   Title Pt will improve worse cervical pain by atleast 3 NPS points to allow increased tolerance during work and home related ADLs.    Baseline NPS: 8/10 current, 7/10 best, 10/10 Worse. 10/20: Worse pain 7/10    Time 8    Period Weeks    Status Partially Met    Target Date 08/07/21      PT LONG TERM GOAL #7   Title Pt will improve bilat UE MMT by atleast 1/2 grade to facilitate greater functional abilities with ADLs related to caregiving.    Baseline UE MMT R/L:   Shoulder Flexion: 4/4-  Shoulder abduction: 4/4-  Shoulder ER: 4/4  Shoulder IR: 4/4  Elbow ext: 5/5   Eblow flex: 5/5 10/20: UE MMT R/L:   Shoulder Flexion: 4/4  Shoulder abduction: 4/4 Shoulder ER: 4/4  Shoulder IR: 4/4  Elbow ext: 5/5   Eblow flex: 5/5    Time 8    Period Weeks    Status Partially Met    Target Date 08/07/21      PT LONG TERM GOAL #8   Title Pt will improve functional R hand strength equal to L to promote return of hand useage during grooming and feeding.    Baseline Grip R/L: 76.1 / 33.9    Lateral prehension grip R/L: 14 / 4 10/20: Grip R/L: 62.0 / 41.4    Lateral prehension grip R/L: 13 / 6.  1/12:  R 69#/ L 49.8#    Time 8    Period Weeks    Status Partially Met    Target Date 08/07/21            Plan -     Clinical Impression Statement Tx. focus on manual stretches/ STM in supine position.  Pt. continues to benefit from TDN to suboccipital region and B UT trigger points in prone position. Pt. understands current HEP and importance of being aware of proper posture/ body mechanics with work-related tasks and household  activities. Pt. Will focus on stretches while out of town on work-related trips.  Pt. Instructed to contact PT if any questions or concerns.     Personal Factors and Comorbidities Comorbidity 2;Profession;Past/Current Experience    Comorbidities Hypertension, Obesity    Examination-Activity Limitations Squat;Locomotion Level    Examination-Participation Restrictions Occupation;Cleaning;Laundry    Stability/Clinical Decision Making Evolving/Moderate complexity    Clinical Decision Making Moderate    Rehab Potential Good    PT Frequency 1x/week   PT Duration 8 weeks    PT Treatment/Interventions ADLs/Self Care Home Management;Aquatic Therapy;Biofeedback;Cryotherapy;Electrical Stimulation;Moist Heat;Ultrasound;Functional mobility training;Therapeutic activities;Therapeutic exercise;Balance training;Neuromuscular re-education;Patient/family education;Manual techniques;Scar mobilization;Passive range of motion;Dry needling;Energy conservation;Taping;Gait training;Stair  training;DME Instruction;Spinal Manipulations;Joint Manipulations    PT Next Visit Plan Progress strengthening exercise.     PT Home Exercise Plan [id number].   HEP: [id number]    Consulted and Agree with Plan of Care Patient         Pura Spice, PT, DPT # [phone] Andee Lineman, SPT 07/11/2021, 10:52 AM

## 2021-07-14 ENCOUNTER — Ambulatory Visit
Admission: RE | Admit: 2021-07-14 | Discharge: 2021-07-14 | Disposition: A | Discharge status: Home / Self Care | Payer: BC Managed Care – PPO | Source: Ambulatory Visit | Attending: Emergency Medicine | Admitting: Emergency Medicine

## 2021-07-14 VITALS — BP 147/74 | HR 79 | Temp 98.1°F | Resp 14 | Ht 68.0 in | Wt 230.0 lb

## 2021-07-14 DIAGNOSIS — J069 Acute upper respiratory infection, unspecified: Secondary | ICD-10-CM | POA: Diagnosis not present

## 2021-07-14 DIAGNOSIS — H66002 Acute suppurative otitis media without spontaneous rupture of ear drum, left ear: Secondary | ICD-10-CM | POA: Diagnosis not present

## 2021-07-14 MED ORDER — IPRATROPIUM BROMIDE 0.06 % NA SOLN: 2.0000 | Freq: Four times a day (QID) | NASAL | 12 refills | Status: DC

## 2021-07-14 MED ORDER — ONDANSETRON 8 MG PO TBDP: 8.0000 mg | ORAL_TABLET | Freq: Three times a day (TID) | ORAL | 0 refills | Status: DC | PRN

## 2021-07-14 MED ORDER — CEFDINIR 300 MG PO CAPS: 300.0000 mg | ORAL_CAPSULE | Freq: Two times a day (BID) | ORAL | 0 refills | Status: DC

## 2021-07-14 NOTE — ED Triage Notes (Signed)
Patient c/o HA, sinus pressure, and left ear pain that started yesterday.  [place]  Patient reports dizziness that started this morning.  Patient reports history of vertigo.

## 2021-07-14 NOTE — Discharge Instructions (Signed)
Take the Cefdinir twice daily for 7 days with food for treatment of your ear infection.  Take an over-the-counter probiotic 1 hour after each dose of antibiotic to prevent diarrhea.  Use over-the-counter Tylenol and ibuprofen as needed for pain or fever.  Place a hot water bottle, or heating pad, underneath your pillowcase at night to help dilate up your ear and aid in pain relief as well as resolution of the infection.  Use the Atrovent nasal spray, 2 squirts in each nostril every 6 hours, as needed for runny nose and postnasal drip.  Use the Zofran every 8 hours as needed for nausea.   Return for reevaluation for any new or worsening symptoms.

## 2021-07-14 NOTE — ED Provider Notes (Signed)
[facility]    CSN: [id number] Arrival date & time: 07/14/21  1453      History   Chief Complaint Chief Complaint  Patient presents with   Dizziness    Appointment    HPI Amanda Humphrey is a 56 y.o. female.   HPI  56 year old female here for evaluation of upper respiratory complaints.  Patient reports that she has been experiencing some increase in sinus pressure over the last week and then developed dizziness 2 days ago that she describes as being more of a feeling of off-balance not room spinning.  This was different than her BPPV that she usually experiences.  Yesterday she developed pain in her left ear and also left sided headache and facial pain and pressure.  She is experiencing a thick yellow nasal discharge.  She denies any fever, sore throat, or cough.  She has been using Zofran to help with the nausea and has been taking Sudafed to help with the congestion.  [place].  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   Fatigue 09/23/2017   History of pneumonia 04/30/2016   Grief reaction 04/30/2016   Vitamin D deficiency 02/22/2015   Hyperlipidemia 01/01/2015   Visit for preventive health examination 12/04/2013   Lower extremity edema 09/02/2013   Essential hypertension 09/02/2013   History of cardiomyopathy 09/02/2013   SVT (supraventricular tachycardia) (HCC) 09/02/2013   Palpitations 09/02/2013   Contrast media allergy 03/10/2013   Fibrocystic breast disease    Morbid obesity (HCC) 02/05/2012   Meniere disease     Past Surgical History:  Procedure Laterality Date   ABDOMINAL HYSTERECTOMY  2011   laprascopic  supracervical, [place]   APPENDECTOMY  1999   BREAST BIOPSY Right Nov 2012    fibrocystic disease, [place]   CESAREAN SECTION     4 total   CHOLECYSTECTOMY  2011   TONSILLECTOMY AND ADENOIDECTOMY  1975    OB History   No obstetric history on file.      Home Medications    Prior to Admission medications   Medication Sig Start Date End Date Taking? Authorizing Provider  amphetamine-dextroamphetamine (ADDERALL) 20 MG tablet Take 20 mg by mouth every evening.  09/22/18  Yes [provider]  carvedilol (COREG) 6.25 MG tablet TAKE 1 TABLET TWICE A DAY 08/20/20  Yes Gollan, Tollie Pizza, MD  cefdinir (OMNICEF) 300 MG capsule Take 1 capsule (300 mg total) by mouth 2 (two) times daily for 7 days. 07/14/21 07/21/21 Yes Becky Augusta, NP  hydrochlorothiazide (HYDRODIURIL) 25 MG tablet Take 1 tablet (25 mg total) by mouth daily. 08/20/20  Yes Antonieta Iba, MD  ipratropium (ATROVENT) 0.06 % nasal spray Place 2 sprays into both nostrils 4 (four) times daily. 07/14/21  Yes Becky Augusta, NP  methocarbamol (ROBAXIN) 500 MG tablet TAKE 1 TABLET TWICE A DAY 03/07/19  Yes Sherlene Shams, MD  omeprazole (PRILOSEC) 40 MG capsule TAKE 1 CAPSULE DAILY 08/17/20  Yes Sherlene Shams, MD  ondansetron (ZOFRAN ODT) 4 MG disintegrating tablet Take 1 tablet (4 mg total) by mouth every 8 (eight) hours as needed for nausea or  vomiting. 04/10/20  Yes Waldon Merl, PA-C  ondansetron (ZOFRAN-ODT) 8 MG disintegrating tablet Take 1 tablet (8 mg total) by mouth every 8 (eight) hours as needed for nausea or vomiting. 07/14/21  Yes Becky Augusta, NP  cyclobenzaprine (FLEXERIL) 5 MG tablet Take 5 mg by mouth at bedtime. 04/28/19   [provider]  ergocalciferol (DRISDOL) 1.25 MG (50000 UT) capsule Take 1 capsule (50,000 Units total) by mouth once a week. 03/21/21   Sherlene Shams, MD  ezetimibe (ZETIA) 10 MG tablet Take 1 tablet (10 mg total) by mouth daily. 08/20/20   Antonieta Iba, MD  furosemide (LASIX) 20  MG tablet Take 1 tablet (20 mg total) by mouth daily as needed. 08/20/20   Antonieta Iba, MD  rosuvastatin (CRESTOR) 5 MG tablet Take 1 tablet (5 mg total) by mouth daily. 08/20/20   Antonieta Iba, MD    Family History Family History  Problem Relation Age of Onset   Hypertension Mother    Cancer Mother        ovarian   Arthritis Mother    Hyperlipidemia Father    Hypertension Father    Diabetes Father    Stroke Father    Alzheimer's disease Maternal Grandmother    Heart disease Maternal Grandmother    Breast cancer Maternal Grandmother    Alzheimer's disease Paternal Grandmother    Breast cancer Paternal Grandmother     Social History Social History   Tobacco Use   Smoking status: Never   Smokeless tobacco: Never  Vaping Use   Vaping Use: Never used  Substance Use Topics   Alcohol use: Yes    Comment: social   Drug use: No     Allergies   Bee venom, Codeine, Hydrocodone, Tramadol, and Oxycodone   Review of Systems Review of Systems  Constitutional:  Negative for fever.  HENT:  Positive for congestion, ear pain and sinus pain. Negative for sore throat and tinnitus.   Respiratory:  Negative for cough.   Neurological:  Positive for dizziness and headaches.  Hematological: Negative.   Psychiatric/Behavioral: Negative.       Physical Exam Triage Vital Signs ED Triage Vitals  Enc Vitals Group     BP 07/14/21 1513 (!) 147/74     Pulse Rate 07/14/21 1513 79     Resp 07/14/21 1513 14     Temp 07/14/21 1513 98.1 F (36.7 C)     Temp Source 07/14/21 1513 Oral     SpO2 07/14/21 1513 96 %     Weight 07/14/21 1511 230 lb (104.3 kg)     Height 07/14/21 1511 5\' 8"  (1.727 m)     Head Circumference --      Peak Flow --      Pain Score 07/14/21 1511 6     Pain Loc --      Pain Edu? --      Excl. in GC? --    Orthostatic VS for the past 24 hrs:  BP- Lying Pulse- Lying BP- Sitting Pulse- Sitting BP- Standing at 0 minutes Pulse- Standing at 0 minutes   07/14/21 1514 126/65 68 136/70 66 124/76 60    Updated Vital Signs BP (!) 147/74 (BP Location: Left Arm)   Pulse 79   Temp 98.1 F (36.7 C) (Oral)   Resp 14   Ht 5\' 8"  (1.727 m)   Wt 230 lb (104.3 kg)   SpO2 96%   BMI 34.97 kg/m   Visual Acuity Right  Eye Distance:   Left Eye Distance:   Bilateral Distance:    Right Eye Near:   Left Eye Near:    Bilateral Near:     Physical Exam Vitals and nursing note reviewed.  Constitutional:      Appearance: Normal appearance. She is not ill-appearing.  HENT:     Head: Normocephalic and atraumatic.     Right Ear: Tympanic membrane, ear canal and external ear normal. There is no impacted cerumen.     Left Ear: Ear canal and external ear normal.     Nose: Congestion and rhinorrhea present.     Mouth/Throat:     Mouth: Mucous membranes are moist.     Pharynx: Oropharynx is clear. No oropharyngeal exudate or posterior oropharyngeal erythema.  Cardiovascular:     Rate and Rhythm: Normal rate and regular rhythm.     Pulses: Normal pulses.     Heart sounds: Normal heart sounds. No murmur heard.    No friction rub. No gallop.  Pulmonary:     Effort: Pulmonary effort is normal.     Breath sounds: Normal breath sounds. No wheezing, rhonchi or rales.  Musculoskeletal:     Cervical back: Normal range of motion and neck supple.  Lymphadenopathy:     Cervical: No cervical adenopathy.  Skin:    General: Skin is warm and dry.     Capillary Refill: Capillary refill takes less than 2 seconds.     Findings: No erythema or rash.  Neurological:     General: No focal deficit present.     Mental Status: She is alert and oriented to person, place, and time.  Psychiatric:        Mood and Affect: Mood normal.        Behavior: Behavior normal.        Thought Content: Thought content normal.        Judgment: Judgment normal.      UC Treatments / Results  Labs (all labs ordered are listed, but only abnormal results are displayed) Labs  Reviewed - No data to display  EKG   Radiology No results found.  Procedures Procedures (including critical care time)  Medications Ordered in UC Medications - No data to display  Initial Impression / Assessment and Plan / UC Course  I have reviewed the triage vital signs and the nursing notes.  Pertinent labs & imaging results that were available during my care of the patient were reviewed by me and considered in my medical decision making (see chart for details).  Patient is a pleasant 56 year old female who appears to not feel well presenting for evaluation of sinus pressure, left-sided ear pain, left-sided headache, and a feeling of off balance that have been developing over the last several days as outlined in HPI above.  Her physical exam reveals an erythematous and injected left tympanic membrane.  The external auditory canal is clear.  Her right TM is pearly gray in appearance with normal light reflex and clear external auditory canal.  Nasal mucosa is pink and moist on the right with marked erythema and edema on the left-hand side.  She also has tenderness to percussion of her left maxillary sinus.  Oropharyngeal exam is benign.  No cervical of adenopathy appreciable exam.  Cardiopulmonary exam reveals S1-S2 heart sounds with regular rate and rhythm and lung sounds are clear auscultation all fields.  Patient physical exam is consistent with otitis media and an upper respiratory infection.  It is curious that she  only seems to have erythema and edema on the left-hand side the patient was lying on her left side when I entered the room.  I will treat her otitis media with cefdinir as patient states she has severe GI distress from Augmentin but can tolerate cefdinir fine.  I will place her on twice daily for 7 days for treatment of her left otitis media.  I will also give Atrovent nasal spray to help with the nasal congestion.  I have refilled the patient Zofran as well to help her with the  nausea.  Return precautions reviewed.   Final Clinical Impressions(s) / UC Diagnoses   Final diagnoses:  Non-recurrent acute suppurative otitis media of left ear without spontaneous rupture of tympanic membrane  Acute upper respiratory infection     Discharge Instructions      Take the Cefdinir twice daily for 7 days with food for treatment of your ear infection.  Take an over-the-counter probiotic 1 hour after each dose of antibiotic to prevent diarrhea.  Use over-the-counter Tylenol and ibuprofen as needed for pain or fever.  Place a hot water bottle, or heating pad, underneath your pillowcase at night to help dilate up your ear and aid in pain relief as well as resolution of the infection.  Use the Atrovent nasal spray, 2 squirts in each nostril every 6 hours, as needed for runny nose and postnasal drip.  Use the Zofran every 8 hours as needed for nausea.   Return for reevaluation for any new or worsening symptoms.      ED Prescriptions     Medication Sig Dispense Auth. Provider   cefdinir (OMNICEF) 300 MG capsule Take 1 capsule (300 mg total) by mouth 2 (two) times daily for 7 days. 14 capsule Becky Augusta, NP   ipratropium (ATROVENT) 0.06 % nasal spray Place 2 sprays into both nostrils 4 (four) times daily. 15 mL Becky Augusta, NP   ondansetron (ZOFRAN-ODT) 8 MG disintegrating tablet Take 1 tablet (8 mg total) by mouth every 8 (eight) hours as needed for nausea or vomiting. 20 tablet Becky Augusta, NP      PDMP not reviewed this encounter.   Becky Augusta, NP 07/14/21 1536

## 2021-07-19 ENCOUNTER — Telehealth: Payer: BC Managed Care – PPO | Admitting: Physician Assistant

## 2021-07-19 ENCOUNTER — Ambulatory Visit: Payer: BC Managed Care – PPO

## 2021-07-19 DIAGNOSIS — R42 Dizziness and giddiness: Secondary | ICD-10-CM

## 2021-07-19 DIAGNOSIS — J019 Acute sinusitis, unspecified: Secondary | ICD-10-CM | POA: Diagnosis not present

## 2021-07-19 DIAGNOSIS — B9689 Other specified bacterial agents as the cause of diseases classified elsewhere: Secondary | ICD-10-CM

## 2021-07-19 MED ORDER — METHYLPREDNISOLONE 4 MG PO TBPK: ORAL_TABLET | ORAL | 0 refills | Status: DC

## 2021-07-19 MED ORDER — MECLIZINE HCL 25 MG PO TABS: 25.0000 mg | ORAL_TABLET | Freq: Three times a day (TID) | ORAL | 0 refills | Status: DC | PRN

## 2021-07-19 MED ORDER — DOXYCYCLINE HYCLATE 100 MG PO TABS: 100.0000 mg | ORAL_TABLET | Freq: Two times a day (BID) | ORAL | 0 refills | Status: DC

## 2021-07-25 ENCOUNTER — Ambulatory Visit: Payer: BC Managed Care – PPO | Admitting: Physical Therapy

## 2021-07-25 ENCOUNTER — Encounter: Payer: Self-pay | Admitting: Physical Therapy

## 2021-07-25 DIAGNOSIS — M6281 Muscle weakness (generalized): Secondary | ICD-10-CM | POA: Diagnosis not present

## 2021-07-25 DIAGNOSIS — S96919A Strain of unspecified muscle and tendon at ankle and foot level, unspecified foot, initial encounter: Secondary | ICD-10-CM | POA: Diagnosis not present

## 2021-07-25 DIAGNOSIS — M25571 Pain in right ankle and joints of right foot: Secondary | ICD-10-CM | POA: Diagnosis not present

## 2021-07-25 DIAGNOSIS — R42 Dizziness and giddiness: Secondary | ICD-10-CM | POA: Diagnosis not present

## 2021-07-25 DIAGNOSIS — M79601 Pain in right arm: Secondary | ICD-10-CM

## 2021-07-25 DIAGNOSIS — M2560 Stiffness of unspecified joint, not elsewhere classified: Secondary | ICD-10-CM

## 2021-07-25 DIAGNOSIS — S93409A Sprain of unspecified ligament of unspecified ankle, initial encounter: Secondary | ICD-10-CM | POA: Diagnosis not present

## 2021-07-25 DIAGNOSIS — R29898 Other symptoms and signs involving the musculoskeletal system: Secondary | ICD-10-CM | POA: Diagnosis not present

## 2021-07-25 NOTE — Therapy (Addendum)
OUTPATIENT PHYSICAL THERAPY TREATMENT NOTE   Patient Name: Amanda Humphrey MRN: [id number] DOB:[birth year], 56 y.o., female Today's Date: 07/26/2021  PCP: Crecencio Mc, MD REFERRING PROVIDER: Samara Deist, DPM     PT End of Session - 05/16/21 0736     Visit Number 77    Number of Visits 33   Date for PT Re-Evaluation 08/07/21    Authorization - Visit Number 7   Authorization - Number of Visits 10  0729 to 0828  (59 mins)   Activity Tolerance Patient tolerated treatment well;Patient limited by pain    Behavior During Therapy [facility] for tasks assessed/performed            Past Medical History:  Diagnosis Date   Allergy    Chronic kidney disease    stones   Fibrocystic breast disease 2013   Meniere disease    Migraines    Past Surgical History:  Procedure Laterality Date   ABDOMINAL HYSTERECTOMY  2011   laprascopic supracervical, [place]   APPENDECTOMY  1999   BREAST BIOPSY Right Nov 2012    fibrocystic disease, [place]   CESAREAN SECTION     4 total   CHOLE[place]STECTOMY  2011   [place]   Patient Active Problem List   Diagnosis Date Noted   H/O of hemilaminectomy 02/15/2019   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[place]) 11/17/2018   Fatigue 09/23/2017   History of pneumonia 04/30/2016   Grief reaction 04/30/2016   Vitamin D deficiency 02/22/2015   Hyperlipidemia 01/01/2015   Visit for preventive health examination 12/04/2013   Lower extremity edema 09/02/2013   Essential hypertension 09/02/2013   History of cardiomyopathy 09/02/2013   SVT (supraventricular tachycardia) ([place]) 09/02/2013   Palpitations 09/02/2013   Contrast media allergy 03/10/2013   Fibrocystic breast disease    Morbid obesity ([place]) 02/05/2012   Meniere disease     REFERRING DIAG:  M79.18 (ICD-10-CM) - Myalgia, other site  Z98.890 (ICD-10-CM) - Other specified postprocedural states  M54.12 (ICD-10-CM) - Radiculopathy, cervical region    THERAPY DIAG:   Joint stiffness of spine  Pain in right ankle and joints of right foot  Pain in right arm  Sprain and strain of ankle  Muscle weakness (generalized)  Ankle weakness  PERTINENT HISTORY: See evaluation  PRECAUTIONS: N/A  SUBJECTIVE:    Pt. Has been experiencing nausea/ dizziness over past week and a half.  Pt. Was screened by Roxana Hires, PT, DPT, [facility] last Friday (see note).  Pt. States she is 50-80% better since last week with dizziness.  Pt. Has started steroids/ meds.  See MD note.     PAIN:  Are you having pain? Yes: NPRS scale: 5/10 Pain location: Neck Pain description: aching/ stiff/ tight Aggravating factors: prolonged sitting/ activity Relieving factors: stretches/ PT     TODAY'S TREATMENT:   07/25/21:  Manual Therapy:  Supine STM to L and R upper trap, levator scapulae  Supine cervical grade II distraction - 30 s hold x 5   AA/PROM of c-spine, overpressure at end range - 20 s hold x 4    Supine L/R UT, levator, cervical rotn. Stretches 5x each.  Pt. Experiencing increase dizziness.      Canalith Repositioning Treatment Repeated Dix-Hallpike with patient which is positive on the R for upbeating R torsional nystagmus with vertigo. In L Dix-Hallpike positions pt has a pure L horizontal geotrophic nystagmus with concurrent vertigo. Performed Roll Test and pt continues with a pure L horizontal geotrophic nystagmus  with concurrent vertigo on the L side but also has a pure R horizontal geotrophic nystagmus with concurrent vertigo on the R side which is much more severe. Findings are consistent with R horizontal canalithiasis. Pt treat with one bout of Gufoni maneuver for presumed R horizontal canalithiasis (sidelying on the L x 30s followed by nose down x 1 minute). Retesting with roll test is negative. Performed one Lempert roll for the R horizontal canal with 1 minute holds in each position. Roll Test retesting remains negative for both vertigo and nystagmus. R  Dix-Hallpike remains positive for faint (improved from initial test) upbeating R torsional nystagmus. Pt has findings which are consistent with R horizontal and R posterior canalithiasis. Will perform BPPV retesting at next visit and treat as indicated. Phillips Grout PT, DPT, [facility]     PATIENT EDUCATION: Education details: HEP Person educated: Patient Education method: Explanation, Demonstration, and Handouts Education comprehension: verbalized understanding and returned demonstration   HOME EXERCISE PROGRAM: [id number].  New HEP: [id number]          PT LONG TERM GOAL #6   Title Pt will improve worse cervical pain by atleast 3 NPS points to allow increased tolerance during work and home related ADLs.    Baseline NPS: 8/10 current, 7/10 best, 10/10 Worse. 10/20: Worse pain 7/10    Time 8    Period Weeks    Status Partially Met    Target Date 08/07/21      PT LONG TERM GOAL #7   Title Pt will improve bilat UE MMT by atleast 1/2 grade to facilitate greater functional abilities with ADLs related to caregiving.    Baseline UE MMT R/L:   Shoulder Flexion: 4/4-  Shoulder abduction: 4/4-  Shoulder ER: 4/4  Shoulder IR: 4/4  Elbow ext: 5/5   Eblow flex: 5/5 10/20: UE MMT R/L:   Shoulder Flexion: 4/4  Shoulder abduction: 4/4 Shoulder ER: 4/4  Shoulder IR: 4/4  Elbow ext: 5/5   Eblow flex: 5/5    Time 8    Period Weeks    Status Partially Met    Target Date 08/07/21      PT LONG TERM GOAL #8   Title Pt will improve functional R hand strength equal to L to promote return of hand useage during grooming and feeding.    Baseline Grip R/L: 76.1 / 33.9    Lateral prehension grip R/L: 14 / 4 10/20: Grip R/L: 62.0 / 41.4    Lateral prehension grip R/L: 13 / 6.  1/12:  R 69#/ L 49.8#    Time 8    Period Weeks    Status Partially Met    Target Date 08/07/21            Plan -     Clinical Impression Statement Tx. focus on manual stretches/ STM to cervical spine/ upper back.  Pt. Limited with  dizziness today during cervical rotn. In supine and pt. Examined by Dr. Claudie Fisherman.  See above note in canalith repositioning treatemtn.  Pt. understands current HEP and importance of being aware of proper posture/ body mechanics with work-related tasks and household activities. Pt. Will focus on stretches while out of town on work-related trips.  Pt. Instructed to contact PT if any questions or concerns.     Personal Factors and Comorbidities Comorbidity 2;Profession;Past/Current Experience    Comorbidities Hypertension, Obesity    Examination-Activity Limitations Squat;Locomotion Level    Examination-Participation Restrictions Occupation;Cleaning;Laundry    Stability/Clinical Decision Making Evolving/Moderate  complexity    Clinical Decision Making Moderate    Rehab Potential Good    PT Frequency 1x/week   PT Duration 8 weeks    PT Treatment/Interventions ADLs/Self Care Home Management;Aquatic Therapy;Biofeedback;Cryotherapy;Electrical Stimulation;Moist Heat;Ultrasound;Functional mobility training;Therapeutic activities;Therapeutic exercise;Balance training;Neuromuscular re-education;Patient/family education;Manual techniques;Scar mobilization;Passive range of motion;Dry needling;Energy conservation;Taping;Gait training;Stair training;DME Instruction;Spinal Manipulations;Joint Manipulations, Canalith repositioning procedure.    PT Next Visit Plan Progress strengthening exercise.  REASSESS BPPV next tx.    PT Home Exercise Plan [id number].   HEP: [id number]    Consulted and Agree with Plan of Care Patient         Pura Spice, PT, DPT # [phone] 07/26/2021, 9:41 AM

## 2021-07-26 NOTE — Therapy (Addendum)
OUTPATIENT PHYSICAL THERAPY VESTIBULAR SCREEN   Patient Name: Amanda Humphrey MRN: [id number] DOB:[birth year], 56 y.o., female Today's Date: 07/19/21   Past Medical History:  Diagnosis Date   Allergy    Chronic kidney disease    stones   Fibrocystic breast disease 2013   Meniere disease    Migraines    Past Surgical History:  Procedure Laterality Date   ABDOMINAL HYSTERECTOMY  2011   laprascopic supracervical, [place]   APPENDECTOMY  1999   BREAST BIOPSY Right Nov 2012    fibrocystic disease, [place]   CESAREAN SECTION     4 total   CHOLECYSTECTOMY  2011   TONSILLECTOMY AND ADENOIDECTOMY  1975   Patient Active Problem List   Diagnosis Date Noted   H/O of hemilaminectomy 02/15/2019   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   Fatigue 09/23/2017   History of pneumonia 04/30/2016   Grief reaction 04/30/2016   Vitamin D deficiency 02/22/2015   Hyperlipidemia 01/01/2015   Visit for preventive health examination 12/04/2013   Lower extremity edema 09/02/2013   Essential hypertension 09/02/2013   History of cardiomyopathy 09/02/2013   SVT (supraventricular tachycardia) (HCC) 09/02/2013   Palpitations 09/02/2013   Contrast media allergy 03/10/2013   Fibrocystic breast disease    Morbid obesity (HCC) 02/05/2012   Meniere disease      PCP: Sherlene Shams, MD  THERAPY DIAG: Dizziness and giddiness  ONSET DATE: 07/13/21   SUBJECTIVE:   Pertinent History Pt states that she got up early Saturday morning on 07/13/21 to go to the bathroom and experienced severe dizziness, hot flashes, dry heaves, impaired balance, and "bouncing" of her visual field. She went to the Urgent Care on 07/14/21 and was diagnosed with a sinus infection and left middle ear infection. She was placed on antibiotics and advised to utilize Atrovent for the nasal drainage as well as Zofran for the nausea. Her dizziness symptoms have improved slightly but have persisted all week. She was seen  by physical therapy on 07/11/21 where she had suboccipital needling for her neck pain. She started with a suboccipital and frontal headache after that session. Pt has a history of vertigo and was seen per her report approximately 10-12 years ago for vertigo by Linus Salmons at [facility]. 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 She reports a history of Menire's Disease years ago but has not had a flare in a long time. She denies any focal weakness, numbness/tingling, facial drooping, or new neurological signs.  Description of dizziness: vertigo and unsteadiness Frequency: Daily Duration: Constant but worse with head turns Symptom nature: constant and motion provoked Progression of symptoms since onset: better History of similar episodes: Yes  Provocative Factors: Turning head quickly, not worsened by positional changes such as lying down or rolling in bed Easing Factors: keeping her head still  Auditory complaints (tinnitus, pain, drainage, hearing loss, aural fullness): Yes, "stuffiness" in the ears but denies tinnitus or hearing loss Vision changes (diplopia, visual field loss, recent changes, recent eye exam): No Chest pain/palpitations: No History of head injury/concussion: No Stress/anxiety: No Headache/migraine: Pt with history of migraine headaches. Currently with a front headache from sinus pressure  Has patient fallen in last 6 months? No, Pertinent pain: Yes, headache Imaging: Yes, Brain MRI years ago which pt reports was WNL, denies recent imaging Prior level of function: Independent Occupational demands: Works in Radio broadcast assistant Flags: (dysarthria, dysphagia,  drop attacks, bowel and bladder changes) Review of systems negative for red flags.    OBJECTIVE  EXAMINATION  POSTURE: No gross deficits contributing to symptoms  NEUROLOGICAL SCREEN: (2+ unless otherwise noted.)  N=normal  Ab=abnormal  Level Dermatome R L Myotome R L Reflex R L  C3 Anterior Neck N N Sidebend C2-3 N N Jaw CN V    C4 Top of Shoulder N N Shoulder Shrug C4 N N Hoffman's UMN    C5 Lateral Upper Arm N N Shoulder ABD C4-5 N N Biceps C5-6    C6 Lateral Arm/ Thumb N N Arm Flex/ Wrist Ext C5-6 N N Brachiorad. C5-6    C7 Middle Finger N N Arm Ext//Wrist Flex C6-7 N N Triceps C7    C8 4th & 5th Finger N N Flex/ Ext Carpi Ulnaris C8 N N Patellar (L3-4)    T1 Medial Arm N N Interossei T1 N N Gastrocnemius    L2 Medial thigh/groin N N Illiopsoas (L2-3) N N     L3 Lower thigh/med.knee N N Quadriceps (L3-4) N N     L4 Medial leg/lat thigh N N Tibialis Ant (L4-5) N N     L5 Lat. leg & dorsal foot N N EHL (L5) N N     S1 post/lat foot/thigh/leg N N Gastrocnemius (S1-2) N N     S2 Post./med. thigh & leg N N Hamstrings (L4-S3) N N       CRANIAL NERVES II, III, IV, VI: Pupils equal and reactive to light, visual acuity and visual fields are intact, extraocular muscles are intact  V: Facial sensation is intact and symmetric bilaterally  VII: Facial strength is intact and symmetric bilaterally  VIII: Hearing is normal as tested by gross conversation IX, X: Palate elevates midline, normal phonation, uvula midline XI: Shoulder shrug strength is intact  XII: Tongue protrudes midline    SOMATOSENSORY Grossly intact to light touch bilateral UEs/LEs as determined by testing dermatomes C2-T2 and L2-S2. Proprioception and hot/cold testing deferred on this date.   COORDINATION Finger to Nose: Normal Heel to Shin: Normal Pronator Drift: Negative Rapid Alternating Movements: Normal Finger to Thumb Opposition: Normal    RANGE OF MOTION Cervical Spine AROM WFL however limited in multiple directions which is baseline for patient. Pt with history of cervical surgery which pain. No focal deficits in AROM noted in BUE/BLE   MANUAL MUSCLE TESTING BUE/BLE strength WNL without focal  deficits   TRANSFERS/GAIT Independent for transfers and ambulation without assistive device    Clinical Test of Sensory Interaction for Balance (CTSIB): Deferred   OCULOMOTOR / VESTIBULAR TESTING  Oculomotor Exam- Room Light  Findings Comments  Ocular Alignment normal   Ocular ROM normal   Spontaneous Nystagmus normal   Gaze-Holding Nystagmus normal   End-Gaze Nystagmus normal   Vergence (normal 2-3") not examined   Smooth Pursuit normal   Cross-Cover Test not examined   Saccades normal   VOR Cancellation normal   Left Head Impulse normal   Right Head Impulse normal   Static Acuity not examined   Dynamic Acuity not examined     Oculomotor Exam- Fixation Suppressed  Findings Comments  Ocular Alignment normal   Spontaneous Nystagmus abnormal Third degree pure L horizontal beating nystagmus  Gaze-Holding Nystagmus abnormal See above  End-Gaze Nystagmus abnormal See above  Head Shaking Nystagmus not examined   Pressure-Induced Nystagmus not examined   Hyperventilation Induced Nystagmus not examined   Skull Vibration Induced Nystagmus not examined  BPPV TESTS:  Symptoms Duration Intensity Nystagmus  L Dix-Hallpike None   None  R Dix-Hallpike None   None  L Head Roll None   None  R Head Roll None   None  L Sidelying Test      R Sidelying Test      (blank = not tested)   FUNCTIONAL OUTCOME MEASURES: Deferred    CLINICAL IMPRESSION: Patient is a 57 y.o. female who was screened today by physical therapy at her request due to ongoing dizziness symptoms. Pt has a third degree spontaneous pure L horizontal-beating nystagmus with fixation suppression testing. All BPPV testing is negative. Based on history and examination pt appears to have a R vestibular hypofunction that is likely the result of an acute vestibular neuritis. It is also possible her dizziness is the result of an acute Menire's attack on 07/13/21 resulting in a persistent hypofunction however pt does not  have any tinnitus or hearing loss which would typically be associated with this event. Pt instructed to follow-up with her medical provider and would benefit from vestibular rehabilitation to accelerate her vestibular adaptation.   REHAB POTENTIAL: Excellent  CLINICAL DECISION MAKING: Unstable/unpredictable    Patient will benefit from skilled therapeutic intervention in order to improve the following deficits and impairments:     Visit Diagnosis: Dizziness and giddiness     Problem List Patient Active Problem List   Diagnosis Date Noted   H/O of hemilaminectomy 02/15/2019   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[place]) 11/17/2018   Fatigue 09/23/2017   History of pneumonia 04/30/2016   Grief reaction 04/30/2016   Vitamin D deficiency 02/22/2015   Hyperlipidemia 01/01/2015   Visit for preventive health examination 12/04/2013   Lower extremity edema 09/02/2013   Essential hypertension 09/02/2013   History of cardiomyopathy 09/02/2013   SVT (supraventricular tachycardia) ([place]) 09/02/2013   Palpitations 09/02/2013   Contrast media allergy 03/10/2013   Fibrocystic breast disease    Morbid obesity ([place]) 02/05/2012   Meniere disease    Phillips Grout PT, DPT, GCS  Aldona Bryner, PT 07/26/2021, 8:28 AM  [facility] [address] [place], [place], [address] Phone: [phone]   Fax:  [phone]  Name: Amanda Humphrey MRN: [id number] Date of Birth: 27-Apr-1965

## 2021-07-29 ENCOUNTER — Ambulatory Visit: Payer: BC Managed Care – PPO | Attending: Podiatry

## 2021-07-29 ENCOUNTER — Telehealth: Payer: Self-pay | Admitting: Internal Medicine

## 2021-07-29 DIAGNOSIS — R42 Dizziness and giddiness: Secondary | ICD-10-CM

## 2021-07-29 DIAGNOSIS — M6281 Muscle weakness (generalized): Secondary | ICD-10-CM | POA: Insufficient documentation

## 2021-07-29 DIAGNOSIS — M25571 Pain in right ankle and joints of right foot: Secondary | ICD-10-CM | POA: Diagnosis not present

## 2021-07-29 DIAGNOSIS — M2560 Stiffness of unspecified joint, not elsewhere classified: Secondary | ICD-10-CM | POA: Diagnosis not present

## 2021-07-29 NOTE — Telephone Encounter (Signed)
Patient began experiencing vestibular issues during PT  a PT referral for [facility] for dx R42 has been requested by the current PT

## 2021-07-29 NOTE — Therapy (Signed)
OUTPATIENT PHYSICAL THERAPY TREATMENT NOTE   Patient Name: Amanda Humphrey MRN: [id number] DOB:July 18, 1965, 56 y.o., female Today's Date: 07/29/2021  PCP: Crecencio Mc, MD REFERRING PROVIDER: Crecencio Mc, MD     PT End of Session - 05/16/21 0736     Visit Number 48    Number of Visits 103   Date for PT Re-Evaluation 08/07/21    Authorization - Visit Number 7   Authorization - Number of Visits 10  [phone] to [id number]  (59 mins)   Activity Tolerance Patient tolerated treatment well;Patient limited by pain    Behavior During Therapy [facility] for tasks assessed/performed            Past Medical History:  Diagnosis Date   Allergy    Chronic kidney disease    stones   Fibrocystic breast disease 2013   Meniere disease    Migraines    Past Surgical History:  Procedure Laterality Date   ABDOMINAL HYSTERECTOMY  2011   laprascopic supracervical, [place]   APPENDECTOMY  1999   BREAST BIOPSY Right Nov 2012    fibrocystic disease, [place]   CESAREAN SECTION     4 total   CHOLECYSTECTOMY  2011   [place]   Patient Active Problem List   Diagnosis Date Noted   H/O of hemilaminectomy 02/15/2019   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[place]) 11/17/2018   Fatigue 09/23/2017   History of pneumonia 04/30/2016   Grief reaction 04/30/2016   Vitamin D deficiency 02/22/2015   Hyperlipidemia 01/01/2015   Visit for preventive health examination 12/04/2013   Lower extremity edema 09/02/2013   Essential hypertension 09/02/2013   History of cardiomyopathy 09/02/2013   SVT (supraventricular tachycardia) ([place]) 09/02/2013   Palpitations 09/02/2013   Contrast media allergy 03/10/2013   Fibrocystic breast disease    Morbid obesity ([place]) 02/05/2012   Meniere disease     REFERRING DIAG:  M79.18 (ICD-10-CM) - Myalgia, other site  Z98.890 (ICD-10-CM) - Other specified postprocedural states  M54.12 (ICD-10-CM) - Radiculopathy, cervical region    THERAPY DIAG:   Dizziness and giddiness  PERTINENT HISTORY: See evaluation  PRECAUTIONS: N/A  SUBJECTIVE:    Pt reports that she is doing well today. Her dizziness has been significantly improved since the last therapy session but she still notices it if she bends forward and stands up quickly.  She finished the steroids and has noticed improvement in her sinus pressure but it is not fully resolved.   PAIN:  Are you having pain? Yes: NPRS scale: 5/10 Pain location: Neck Pain description: aching/ stiff/ tight Aggravating factors: prolonged sitting/ activity Relieving factors: stretches/ PT     TODAY'S TREATMENT:   07/29/21:  Canalith Repositioning Treatment Repeated Roll Test which is negative bilaterally. Performed R Dix-Hallpike with patient which is positive for upbeating R torsional nystagmus with mild to moderate vertigo. Pt treat with three bouts of Epley maneuver for presumed R posterior canal BPPV. Ninety second holds in each position with retesting between maneuvers and after final maneuver. After first maneuver testing is negative for both vertigo and nystagmus. After second and third maneuver she presents with mild upbeating R torsional nystagmus with concurrent vertigo which lasts for approximately 10-15s. After final Epley maneuver performed R sidelying test which is negative for nystagmus or vertigo. Completed R Liberatory maneuver with 60s holds in each position. After Liberatory maneuver pt continues to present with mild vertigo and mild upbeating R torsional nystagmus. Pt declines instructions for home Epley maneuver and  would prefer to perform them in the clinic.    PATIENT EDUCATION: Education details: Plan of care Person educated: Patient Education method: Explanation, Demonstration, and Handouts Education comprehension: verbalized understanding and returned demonstration   HOME EXERCISE PROGRAM: [id number].  New HEP: [id number]          PT LONG TERM GOAL #6   Title Pt will  improve worse cervical pain by atleast 3 NPS points to allow increased tolerance during work and home related ADLs.    Baseline NPS: 8/10 current, 7/10 best, 10/10 Worse. 10/20: Worse pain 7/10    Time 8    Period Weeks    Status Partially Met    Target Date 08/07/21      PT LONG TERM GOAL #7   Title Pt will improve bilat UE MMT by atleast 1/2 grade to facilitate greater functional abilities with ADLs related to caregiving.    Baseline UE MMT R/L:   Shoulder Flexion: 4/4-  Shoulder abduction: 4/4-  Shoulder ER: 4/4  Shoulder IR: 4/4  Elbow ext: 5/5   Eblow flex: 5/5 10/20: UE MMT R/L:   Shoulder Flexion: 4/4  Shoulder abduction: 4/4 Shoulder ER: 4/4  Shoulder IR: 4/4  Elbow ext: 5/5   Eblow flex: 5/5    Time 8    Period Weeks    Status Partially Met    Target Date 08/07/21      PT LONG TERM GOAL #8   Title Pt will improve functional R hand strength equal to L to promote return of hand useage during grooming and feeding.    Baseline Grip R/L: 76.1 / 33.9    Lateral prehension grip R/L: 14 / 4 10/20: Grip R/L: 62.0 / 41.4    Lateral prehension grip R/L: 13 / 6.  1/12:  R 69#/ L 49.8#    Time 8    Period Weeks    Status Partially Met    Target Date 08/07/21            Plan -     Clinical Impression Statement Repeated Roll Test which is negative bilaterally. Performed R Dix-Hallpike with patient which is positive for upbeating R torsional nystagmus with mild to moderate vertigo. Pt treat with three bouts of Epley maneuver for presumed R posterior canal BPPV. Ninety second holds in each position with retesting between maneuvers and after final maneuver. After first maneuver testing is negative for both vertigo and nystagmus. After second and third maneuver she presents with mild upbeating R torsional nystagmus with concurrent vertigo which lasts for approximately 10-15s. After final Epley maneuver performed R sidelying test which is negative for nystagmus or vertigo. Completed R Liberatory  maneuver with 60s holds in each position. After Liberatory maneuver pt continues to present with mild vertigo and mild upbeating R torsional nystagmus. Pt declines instructions for home Epley maneuver and would prefer to perform them in the clinic.   Personal Factors and Comorbidities Comorbidity 2;Profession;Past/Current Experience    Comorbidities Hypertension, Obesity    Examination-Activity Limitations Squat;Locomotion Level    Examination-Participation Restrictions Occupation;Cleaning;Laundry    Stability/Clinical Decision Making Evolving/Moderate complexity    Clinical Decision Making Moderate    Rehab Potential Good    PT Frequency 1x/week   PT Duration 8 weeks    PT Treatment/Interventions ADLs/Self Care Home Management;Aquatic Therapy;Biofeedback;Cryotherapy;Electrical Stimulation;Moist Heat;Ultrasound;Functional mobility training;Therapeutic activities;Therapeutic exercise;Balance training;Neuromuscular re-education;Patient/family education;Manual techniques;Scar mobilization;Passive range of motion;Dry needling;Energy conservation;Taping;Gait training;Stair training;DME Instruction;Spinal Manipulations;Joint Manipulations, Canalith repositioning procedure.    PT Next Visit Plan Progress  strengthening exercise.  REASSESS BPPV next tx.    PT Home Exercise Plan [id number].   HEP: [id number]    Consulted and Agree with Plan of Care Patient          Phillips Grout PT, DPT, GCS  07/29/2021, 1:06 PM

## 2021-07-29 NOTE — Telephone Encounter (Signed)
-----   Message from Marcie Mowers sent at 07/29/2021  3:56 PM EDT ----- Regarding: PT referral Good afternoon!  We have been currently treating this patient for PT in [place], but patient started experiencing vestibular issues.  We completed a screen for it and therapist is recommending a formal PT eval. Can you please enter in a PT referral for [facility] [place] [facility] for dx R42.  Thank you!   Rosana Fret [facility]

## 2021-08-05 NOTE — Therapy (Addendum)
OUTPATIENT PHYSICAL THERAPY TREATMENT NOTE/RECERTIFICATION/PROGRESS NOTE  Dates of reporting period  04/04/21   to   08/07/21    Patient Name: Amanda Humphrey MRN: [id number] DOB:1965-12-03, 56 y.o., female Today's Date: 08/07/2021  PCP: Crecencio Mc, MD REFERRING PROVIDER: Crecencio Mc, MD     PT End of Session - 05/16/21 0736     Visit Number 50    Number of Visits 54   Date for PT Re-Evaluation 10/02/2021     Authorization - Visit Number 10   Authorization - Number of Visits 10  0729 to 0828  (59 mins)   Activity Tolerance Patient tolerated treatment well;Patient limited by pain    Behavior During Therapy [facility] for tasks assessed/performed            Past Medical History:  Diagnosis Date   Allergy    Chronic kidney disease    stones   Fibrocystic breast disease 2013   Meniere disease    Migraines    Past Surgical History:  Procedure Laterality Date   ABDOMINAL HYSTERECTOMY  2011   laprascopic supracervical, [place]   APPENDECTOMY  1999   BREAST BIOPSY Right Nov 2012    fibrocystic disease, [place]   CESAREAN SECTION     4 total   CHOLECYSTECTOMY  2011   [place]   Patient Active Problem List   Diagnosis Date Noted   H/O of hemilaminectomy 02/15/2019   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[place]) 11/17/2018   Fatigue 09/23/2017   History of pneumonia 04/30/2016   Grief reaction 04/30/2016   Vitamin D deficiency 02/22/2015   Hyperlipidemia 01/01/2015   Visit for preventive health examination 12/04/2013   Lower extremity edema 09/02/2013   Essential hypertension 09/02/2013   History of cardiomyopathy 09/02/2013   SVT (supraventricular tachycardia) ([place]) 09/02/2013   Palpitations 09/02/2013   Contrast media allergy 03/10/2013   Fibrocystic breast disease    Morbid obesity ([place]) 02/05/2012   Meniere disease     REFERRING DIAG:  M79.18 (ICD-10-CM) - Myalgia, other site  Z98.890 (ICD-10-CM) - Other specified  postprocedural states  M54.12 (ICD-10-CM) - Radiculopathy, cervical region    THERAPY DIAG:  Dizziness and giddiness  PERTINENT HISTORY: See evaluation  PRECAUTIONS: N/A  SUBJECTIVE:    Pt reports that she is doing alright today. Her dizziness has been improved since the last therapy session but she has episodes of vertigo especially if she rolls onto her right side. Her sinus pressure is improved but not fully resolved. No specific questions upon arrival.    PAIN:  Are you having pain? Yes: NPRS scale: Not rated Pain location: Neck Pain description: aching/ stiff/ tight Aggravating factors: prolonged sitting/ activity Relieving factors: stretches/ PT     TODAY'S TREATMENT:   08/07/21:  Canalith Repositioning Treatment Negative L Dix-Hallpike for either vertigo or nystagmus. Upon sitting however pt experiences vertigo with downbeating R torsional nystagmus. Performed R Dix-Hallpike with patient which is positive for upbeating R torsional nystagmus after approximately 15-20s of latency with mild to moderate vertigo. Pt treated with two bouts of Epley maneuver for presumed R posterior canal BPPV. Ninety second holds in each position with retesting between maneuvers. Utilized vibration on R mastoid during maneuvers. After first maneuver testing is negative for nystagmus however pt reports mild vertigo. After second maneuver performed Roll Test. In the R Roll Test position pt experiences severe vertigo with pure R horizontal geotrophic nystagmus after approximately 15s of latency. Symptoms last for 20-30s. L Roll Test position  is also positive for vertigo with pure L horizontal geotrophic nystagmus which starts immediately and lasts for approximately 20s. Pt treated with two bouts of Gufoni maneuver for presumed R horizontal canalithiasis (sidelying on the L x 30s followed by nose down x 1 minute). Retesting with roll test after second maneuver is negative for nystagmus however pt reports  faint vertigo.    PATIENT EDUCATION: Education details: Plan of care Person educated: Patient Education method: Explanation, Demonstration, and Handouts Education comprehension: verbalized understanding and returned demonstration   HOME EXERCISE PROGRAM: [id number].  New HEP: [id number]          PT LONG TERM GOAL #6   Title Pt will improve worse cervical pain by atleast 3 NPS points to allow increased tolerance during work and home related ADLs.    Baseline NPS: 8/10 current, 7/10 best, 10/10 Worse. 10/20: Worse pain 7/10    Time 8    Period Weeks    Status Partially Met    Target Date 10/02/2021       PT LONG TERM GOAL #7   Title Pt will improve bilat UE MMT by atleast 1/2 grade to facilitate greater functional abilities with ADLs related to caregiving.    Baseline UE MMT R/L:   Shoulder Flexion: 4/4-  Shoulder abduction: 4/4-  Shoulder ER: 4/4  Shoulder IR: 4/4  Elbow ext: 5/5   Eblow flex: 5/5 10/20: UE MMT R/L:   Shoulder Flexion: 4/4  Shoulder abduction: 4/4 Shoulder ER: 4/4  Shoulder IR: 4/4  Elbow ext: 5/5   Eblow flex: 5/5    Time 8    Period Weeks    Status Partially Met    Target Date 10/02/2021       PT LONG TERM GOAL #8   Title Pt will improve functional R hand strength equal to L to promote return of hand useage during grooming and feeding.    Baseline Grip R/L: 76.1 / 33.9    Lateral prehension grip R/L: 14 / 4 10/20: Grip R/L: 62.0 / 41.4    Lateral prehension grip R/L: 13 / 6.  1/12:  R 69#/ L 49.8#    Time 8    Period Weeks    Status Partially Met    Target Date 10/02/2021             Plan -     Clinical Impression Statement Negative L Dix-Hallpike for either vertigo or nystagmus. Upon sitting however pt experiences vertigo with downbeating R torsional nystagmus. Performed R Dix-Hallpike with patient which is positive for upbeating R torsional nystagmus after approximately 15-20s of latency with mild to moderate vertigo. Pt treated with two bouts of Epley  maneuver for presumed R posterior canal BPPV. Ninety second holds in each position with retesting between maneuvers. Utilized vibration on R mastoid during maneuvers. After first maneuver testing is negative for nystagmus however pt reports mild vertigo. After second maneuver performed Roll Test. In the R Roll Test position pt experiences severe vertigo with pure R horizontal geotrophic nystagmus after approximately 15s of latency. Symptoms last for 20-30s. L Roll Test position is also positive for vertigo with pure L horizontal geotrophic nystagmus which starts immediately and lasts for approximately 20s. Pt treated with two bouts of Gufoni maneuver for presumed R horizontal canalithiasis (sidelying on the L x 30s followed by nose down x 1 minute). Retesting with roll test after second maneuver is negative for nystagmus however pt reports faint vertigo.  Pt is now being treated for  both her vertigo and her neck pain and will benefit from continued PT services until vertigo has resolved and she is able to self-manage neck pain   Personal Factors and Comorbidities Comorbidity 2;Profession;Past/Current Experience    Comorbidities Hypertension, Obesity    Examination-Activity Limitations Squat;Locomotion Level    Examination-Participation Restrictions Occupation;Cleaning;Laundry    Stability/Clinical Decision Making Evolving/Moderate complexity    Clinical Decision Making Moderate    Rehab Potential Good    PT Frequency 2x/week   PT Duration 8 weeks    PT Treatment/Interventions ADLs/Self Care Home Management;Aquatic Therapy;Biofeedback;Cryotherapy;Electrical Stimulation;Moist Heat;Ultrasound;Functional mobility training;Therapeutic activities;Therapeutic exercise;Balance training;Neuromuscular re-education;Patient/family education;Manual techniques;Scar mobilization;Passive range of motion;Dry needling;Energy conservation;Taping;Gait training;Stair training;DME Instruction;Spinal Manipulations;Joint  Manipulations, Canalith repositioning procedure.    PT Next Visit Plan Progress strengthening exercise.  REASSESS BPPV next tx.    PT Home Exercise Plan [id number].   HEP: [id number]    Consulted and Agree with Plan of Care Patient          Phillips Grout PT, DPT, GCS  08/07/2021, 11:18 AM

## 2021-08-07 ENCOUNTER — Ambulatory Visit: Payer: BC Managed Care – PPO

## 2021-08-07 DIAGNOSIS — R42 Dizziness and giddiness: Secondary | ICD-10-CM | POA: Diagnosis not present

## 2021-08-07 DIAGNOSIS — M25571 Pain in right ankle and joints of right foot: Secondary | ICD-10-CM | POA: Diagnosis not present

## 2021-08-07 DIAGNOSIS — M6281 Muscle weakness (generalized): Secondary | ICD-10-CM | POA: Diagnosis not present

## 2021-08-07 DIAGNOSIS — M2560 Stiffness of unspecified joint, not elsewhere classified: Secondary | ICD-10-CM | POA: Diagnosis not present

## 2021-08-08 ENCOUNTER — Ambulatory Visit: Payer: BC Managed Care – PPO | Admitting: Physical Therapy

## 2021-08-12 ENCOUNTER — Other Ambulatory Visit: Payer: Self-pay | Admitting: Internal Medicine

## 2021-08-12 DIAGNOSIS — M4692 Unspecified inflammatory spondylopathy, cervical region: Secondary | ICD-10-CM

## 2021-08-12 NOTE — Therapy (Signed)
OUTPATIENT PHYSICAL THERAPY TREATMENT NOTE   Patient Name: Amanda Humphrey MRN: [id number] DOB:[birth year], 56 y.o., female Today's Date: 08/13/2021  PCP: Crecencio Mc, MD REFERRING PROVIDER: Crecencio Mc, MD     PT End of Session - 05/16/21 0736     Visit Number 51   Number of Visits 2   Date for PT Re-Evaluation 10/02/2021     Authorization - Visit Number 7   Authorization - Number of Visits 10  [phone]   Activity Tolerance Patient tolerated treatment well;Patient limited by pain    Behavior During Therapy [facility] for tasks assessed/performed            Past Medical History:  Diagnosis Date   Allergy    Chronic kidney disease    stones   Fibrocystic breast disease 2013   Meniere disease    Migraines    Past Surgical History:  Procedure Laterality Date   ABDOMINAL HYSTERECTOMY  2011   laprascopic supracervical, [place]   APPENDECTOMY  1999   BREAST BIOPSY Right Nov 2012    fibrocystic disease, [place]   CESAREAN SECTION     4 total   CHOLECYSTECTOMY  2011   [place]   Patient Active Problem List   Diagnosis Date Noted   H/O of hemilaminectomy 02/15/2019   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[place]) 11/17/2018   Fatigue 09/23/2017   History of pneumonia 04/30/2016   Grief reaction 04/30/2016   Vitamin D deficiency 02/22/2015   Hyperlipidemia 01/01/2015   Visit for preventive health examination 12/04/2013   Lower extremity edema 09/02/2013   Essential hypertension 09/02/2013   History of cardiomyopathy 09/02/2013   SVT (supraventricular tachycardia) ([place]) 09/02/2013   Palpitations 09/02/2013   Contrast media allergy 03/10/2013   Fibrocystic breast disease    Morbid obesity ([place]) 02/05/2012   Meniere disease     REFERRING DIAG:  M79.18 (ICD-10-CM) - Myalgia, other site  Z98.890 (ICD-10-CM) - Other specified postprocedural states  M54.12 (ICD-10-CM) - Radiculopathy, cervical region    THERAPY DIAG:  Dizziness and  giddiness  PERTINENT HISTORY: See evaluation  PRECAUTIONS: N/A  SUBJECTIVE:    Pt reports that she is doing alright today. Her dizziness is significantly better today compared to last week. She felt very unwell after the last therapy session for the rest of the day. Her neck has been painful and she reports 7/10 neck pain upon arrival today. No specific questions upon arrival.    PAIN:  Are you having pain? Yes: NPRS scale: 7/10 Pain location: Neck Pain description: aching/ stiff/ tight Aggravating factors: prolonged sitting/ activity Relieving factors: stretches/ PT     TODAY'S TREATMENT:   08/13/21:  Canalith Repositioning Treatment Negative Roll Tests and Dix-Hallpike bilaterally for either vertigo or nystagmus.  Pt treated with one bout of Gufoni maneuver given prior positive testing for R horizontal canalithiasis (sidelying on the L x 30s followed by nose down x 1 minute).   Neuromuscular Re-education  VOR x 1 horizontal in standing with feet together 3 x 60s, 2/10 dizziness after first bout, 3/10 after second and third bouts;   Manual Therapy  Supine STM to L and R upper trap, levator scapulae, and cervical paraspinals; Supine cervical grade II distraction - 30 s hold x 3  Supine L/R UT, levator, and lateral flexion stretches 45s each bilaterally; Suboccipital release x 3 minutes;   PATIENT EDUCATION: Education details: Plan of care Person educated: Patient Education method: Explanation, Demonstration, verbal cues, and tactile cues Education  comprehension: verbalized understanding and returned demonstration   HOME EXERCISE PROGRAM: [id number].  New HEP: [id number]          PT LONG TERM GOAL #6   Title Pt will improve worse cervical pain by atleast 3 NPS points to allow increased tolerance during work and home related ADLs.    Baseline NPS: 8/10 current, 7/10 best, 10/10 Worse. 10/20: Worse pain 7/10    Time 8    Period Weeks    Status Partially Met     Target Date 10/02/2021       PT LONG TERM GOAL #7   Title Pt will improve bilat UE MMT by atleast 1/2 grade to facilitate greater functional abilities with ADLs related to caregiving.    Baseline UE MMT R/L:   Shoulder Flexion: 4/4-  Shoulder abduction: 4/4-  Shoulder ER: 4/4  Shoulder IR: 4/4  Elbow ext: 5/5   Eblow flex: 5/5 10/20: UE MMT R/L:   Shoulder Flexion: 4/4  Shoulder abduction: 4/4 Shoulder ER: 4/4  Shoulder IR: 4/4  Elbow ext: 5/5   Eblow flex: 5/5    Time 8    Period Weeks    Status Partially Met    Target Date 10/02/2021       PT LONG TERM GOAL #8   Title Pt will improve functional R hand strength equal to L to promote return of hand useage during grooming and feeding.    Baseline Grip R/L: 76.1 / 33.9    Lateral prehension grip R/L: 14 / 4 10/20: Grip R/L: 62.0 / 41.4    Lateral prehension grip R/L: 13 / 6.  1/12:  R 69#/ L 49.8#    Time 8    Period Weeks    Status Partially Met    Target Date 10/02/2021             Plan -     Clinical Impression Statement Negative Roll Tests and Dix-Hallpike bilaterally for either vertigo or nystagmus.  Pt treated with one bout of Gufoni maneuver given prior positive testing for R horizontal canalithiasis (sidelying on the L x 30s followed by nose down x 1 minute). After canalith repositioning treatment proceeded to perform gaze stabilization exercises with patient in standing which increase dizziness to 2-3/10. Pt provided verbal instruction about how to progress her gaze stabilization exercises at home. Additional time spent on manual techniques to treat neck pain. Pt has no further needs for vestibular therapy and is independent with her HEP. She will continue to follow-up with primary therapist for her continued neck pain. Pt will benefit from PT services to address deficits in neck range of motion, strength, and pain in order to return to full function at home and work with less pain.    Personal Factors and Comorbidities Comorbidity  2;Profession;Past/Current Experience    Comorbidities Hypertension, Obesity    Examination-Activity Limitations Squat;Locomotion Level    Examination-Participation Restrictions Occupation;Cleaning;Laundry    Stability/Clinical Decision Making Evolving/Moderate complexity    Clinical Decision Making Moderate    Rehab Potential Good    PT Frequency 2x/week   PT Duration 8 weeks    PT Treatment/Interventions ADLs/Self Care Home Management;Aquatic Therapy;Biofeedback;Cryotherapy;Electrical Stimulation;Moist Heat;Ultrasound;Functional mobility training;Therapeutic activities;Therapeutic exercise;Balance training;Neuromuscular re-education;Patient/family education;Manual techniques;Scar mobilization;Passive range of motion;Dry needling;Energy conservation;Taping;Gait training;Stair training;DME Instruction;Spinal Manipulations;Joint Manipulations, Canalith repositioning procedure.    PT Next Visit Plan Progress strengthening exercise.  REASSESS BPPV next tx.    PT Home Exercise Plan [id number].   HEP: [id number]    Consulted and  Agree with Plan of Care Patient          Phillips Grout PT, DPT, GCS  08/13/2021, 9:18 AM

## 2021-08-13 ENCOUNTER — Ambulatory Visit: Payer: BC Managed Care – PPO

## 2021-08-13 DIAGNOSIS — R42 Dizziness and giddiness: Secondary | ICD-10-CM | POA: Diagnosis not present

## 2021-08-13 DIAGNOSIS — M2560 Stiffness of unspecified joint, not elsewhere classified: Secondary | ICD-10-CM | POA: Diagnosis not present

## 2021-08-13 DIAGNOSIS — M25571 Pain in right ankle and joints of right foot: Secondary | ICD-10-CM | POA: Diagnosis not present

## 2021-08-13 DIAGNOSIS — M6281 Muscle weakness (generalized): Secondary | ICD-10-CM | POA: Diagnosis not present

## 2021-08-15 ENCOUNTER — Other Ambulatory Visit: Payer: Self-pay | Admitting: Cardiovascular Disease

## 2021-08-19 ENCOUNTER — Ambulatory Visit: Payer: BC Managed Care – PPO | Admitting: Physical Therapy

## 2021-08-19 ENCOUNTER — Encounter: Payer: Self-pay | Admitting: Physical Therapy

## 2021-08-19 DIAGNOSIS — M25571 Pain in right ankle and joints of right foot: Secondary | ICD-10-CM | POA: Diagnosis not present

## 2021-08-19 DIAGNOSIS — R42 Dizziness and giddiness: Secondary | ICD-10-CM | POA: Diagnosis not present

## 2021-08-19 DIAGNOSIS — M2560 Stiffness of unspecified joint, not elsewhere classified: Secondary | ICD-10-CM | POA: Diagnosis not present

## 2021-08-19 DIAGNOSIS — M6281 Muscle weakness (generalized): Secondary | ICD-10-CM

## 2021-08-23 NOTE — Therapy (Signed)
OUTPATIENT PHYSICAL THERAPY TREATMENT NOTE   Patient Name: Amanda Humphrey MRN: [id number] DOB:1965/02/09, 56 y.o., female Today's Date: 08/23/2021  PCP: Crecencio Mc, MD REFERRING PROVIDER: Crecencio Mc, MD     PT End of Session - 05/16/21 0736     Visit Number 29    Number of Visits 11   Date for PT Re-Evaluation 10/02/21    Authorization - Visit Number 2   Authorization - Number of Visits 10  0730 to 0821  (51 mins)   Activity Tolerance Patient tolerated treatment well;Patient limited by pain    Behavior During Therapy [facility] for tasks assessed/performed            Past Medical History:  Diagnosis Date   Allergy    Chronic kidney disease    stones   Fibrocystic breast disease 2013   Meniere disease    Migraines    Past Surgical History:  Procedure Laterality Date   ABDOMINAL HYSTERECTOMY  2011   laprascopic supracervical, [place]   APPENDECTOMY  1999   BREAST BIOPSY Right Nov 2012    fibrocystic disease, [place]   CESAREAN SECTION     4 total   CHOLECYSTECTOMY  2011   [place]   Patient Active Problem List   Diagnosis Date Noted   H/O of hemilaminectomy 02/15/2019   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[place]) 11/17/2018   Fatigue 09/23/2017   History of pneumonia 04/30/2016   Grief reaction 04/30/2016   Vitamin D deficiency 02/22/2015   Hyperlipidemia 01/01/2015   Visit for preventive health examination 12/04/2013   Lower extremity edema 09/02/2013   Essential hypertension 09/02/2013   History of cardiomyopathy 09/02/2013   SVT (supraventricular tachycardia) ([place]) 09/02/2013   Palpitations 09/02/2013   Contrast media allergy 03/10/2013   Fibrocystic breast disease    Morbid obesity ([place]) 02/05/2012   Meniere disease     REFERRING DIAG:  M79.18 (ICD-10-CM) - Myalgia, other site  Z98.890 (ICD-10-CM) - Other specified postprocedural states  M54.12 (ICD-10-CM) - Radiculopathy, cervical region    THERAPY DIAG:   Joint stiffness of spine  Pain in right ankle and joints of right foot  Muscle weakness (generalized)  PERTINENT HISTORY: See evaluation  PRECAUTIONS: N/A  SUBJECTIVE:    Pt. Reports improvement with dizziness but still has symptoms.  Pt. Has been completing vestibular ex. Prescribed by Roxana Hires, PT, DPT, [facility]CS.  Pt. Reports neck/ upper back has been still and hurting.  Pt. Hasn't had dry needling in a couple weeks.     PAIN:  Are you having pain? Yes: NPRS scale: 5/10 Pain location: Neck Pain description: aching/ stiff/ tight Aggravating factors: prolonged sitting/ activity Relieving factors: stretches/ PT     TODAY'S TREATMENT:   08/23/21:  Manual Therapy:  Supine STM to L and R upper trap, levator scapulae  Supine cervical grade II distraction - 30 s hold x 5.  Supine L/R UT, levator, cervical rotn. Stretches 5x each  L/R shoulder AAROM to end-range flexion/ abduction.    AA/PROM of c-spine, overpressure at end range - 20 s hold x 4    Seated cervical/ thoracic rotn. To tolerable end-range.   Reviewed HEP  Trigger Point Dry Needling (TDN)   Education performed with patient regarding potential benefit of TDN. Reviewed precautions and risks with patient. Reviewed special precautions/risks over lung fields which include pneumothorax. Reviewed signs and symptoms of pneumothorax and advised pt to go to ER immediately if these symptoms develop advise them of dry  needling treatment. Pt provided verbal consent to treatment. TDN performed to bilateral UT/ mid-cervical paraspinals with 5, 0.40 x 50 L-type single needle placements on each side with 2 local twitch response (LTR). Pistoning technique utilized. Improved pain-free motion following intervention.  Increase discomfort reported with suboccipital region (R>L).  Good tx. Tolerance.  Applied Biofreeze to upper back/ neck after tx.     PATIENT EDUCATION: Education details: HEP Person educated: Patient Education  method: Explanation, Media planner, and Handouts Education comprehension: verbalized understanding and returned demonstration   HOME EXERCISE PROGRAM: [id number].  New HEP: [id number]             PT LONG TERM GOAL #6    Title Pt will improve worse cervical pain by atleast 3 NPS points to allow increased tolerance during work and home related ADLs.     Baseline NPS: 8/10 current, 7/10 best, 10/10 Worse. 10/20: Worse pain 7/10     Time 8     Period Weeks     Status Partially Met     Target Date 10/02/2021           PT LONG TERM GOAL #7    Title Pt will improve bilat UE MMT by atleast 1/2 grade to facilitate greater functional abilities with ADLs related to caregiving.     Baseline UE MMT R/L:   Shoulder Flexion: 4/4-  Shoulder abduction: 4/4-  Shoulder ER: 4/4  Shoulder IR: 4/4  Elbow ext: 5/5   Eblow flex: 5/5 10/20: UE MMT R/L:   Shoulder Flexion: 4/4  Shoulder abduction: 4/4 Shoulder ER: 4/4  Shoulder IR: 4/4  Elbow ext: 5/5   Eblow flex: 5/5     Time 8     Period Weeks     Status Partially Met     Target Date 10/02/2021           PT LONG TERM GOAL #8    Title Pt will improve functional R hand strength equal to L to promote return of hand useage during grooming and feeding.     Baseline Grip R/L: 76.1 / 33.9    Lateral prehension grip R/L: 14 / 4 10/20: Grip R/L: 62.0 / 41.4    Lateral prehension grip R/L: 13 / 6.  1/12:  R 69#/ L 49.8#     Time 8     Period Weeks     Status Partially Met     Target Date 10/02/2021            Plan -     Clinical Impression Statement Tx. focus on manual stretches/ STM to cervical spine/ upper back.  Pt. Limited with dizziness today during cervical rotn. In supine and pt.   Pt. understands current HEP and importance of being aware of proper posture/ body mechanics with work-related tasks and household activities. Pt. Will focus on stretches if out of town this weekend.  Pt. Instructed to contact PT if any questions or concerns.     Personal Factors and  Comorbidities Comorbidity 2;Profession;Past/Current Experience    Comorbidities Hypertension, Obesity    Examination-Activity Limitations Squat;Locomotion Level    Examination-Participation Restrictions Occupation;Cleaning;Laundry    Stability/Clinical Decision Making Evolving/Moderate complexity    Clinical Decision Making Moderate    Rehab Potential Good    PT Frequency 1x/week   PT Duration 8 weeks    PT Treatment/Interventions ADLs/Self Care Home Management;Aquatic Therapy;Biofeedback;Cryotherapy;Electrical Stimulation;Moist Heat;Ultrasound;Functional mobility training;Therapeutic activities;Therapeutic exercise;Balance training;Neuromuscular re-education;Patient/family education;Manual techniques;Scar mobilization;Passive range of motion;Dry needling;Energy conservation;Taping;Gait training;Stair training;DME Instruction;Spinal Manipulations;Joint  Manipulations, Canalith repositioning procedure.    PT Next Visit Plan Progress strengthening exercise.  REASSESS BPPV next tx.    PT Home Exercise Plan [id number].   HEP: [id number]    Consulted and Agree with Plan of Care Patient         Pura Spice, PT, DPT # [phone] 08/23/2021, 10:42 AM

## 2021-08-29 ENCOUNTER — Other Ambulatory Visit: Payer: Self-pay | Admitting: Cardiovascular Disease

## 2021-08-29 NOTE — Telephone Encounter (Signed)
Please schedule overdue 12 month F/U appointment. Thank you! 

## 2021-08-29 NOTE — Telephone Encounter (Signed)
Scheduled

## 2021-09-04 ENCOUNTER — Ambulatory Visit: Payer: BC Managed Care – PPO | Attending: Podiatry | Admitting: Physical Therapy

## 2021-09-04 ENCOUNTER — Encounter: Payer: Self-pay | Admitting: Physical Therapy

## 2021-09-04 DIAGNOSIS — M2560 Stiffness of unspecified joint, not elsewhere classified: Secondary | ICD-10-CM | POA: Insufficient documentation

## 2021-09-04 DIAGNOSIS — M79601 Pain in right arm: Secondary | ICD-10-CM | POA: Insufficient documentation

## 2021-09-04 DIAGNOSIS — M25571 Pain in right ankle and joints of right foot: Secondary | ICD-10-CM | POA: Diagnosis not present

## 2021-09-04 DIAGNOSIS — M6281 Muscle weakness (generalized): Secondary | ICD-10-CM | POA: Diagnosis not present

## 2021-09-04 DIAGNOSIS — S93409A Sprain of unspecified ligament of unspecified ankle, initial encounter: Secondary | ICD-10-CM | POA: Diagnosis not present

## 2021-09-04 DIAGNOSIS — S96919A Strain of unspecified muscle and tendon at ankle and foot level, unspecified foot, initial encounter: Secondary | ICD-10-CM | POA: Insufficient documentation

## 2021-09-04 NOTE — Therapy (Signed)
OUTPATIENT PHYSICAL THERAPY TREATMENT NOTE   Patient Name: Amanda Humphrey MRN: [id number] DOB:[birth year], 56 y.o., female Today's Date: 09/04/2021  PCP: Crecencio Mc, MD REFERRING PROVIDER: Crecencio Mc, MD     PT End of Session - 05/16/21 0736     Visit Number 46    Number of Visits 47   Date for PT Re-Evaluation 10/30/21    Authorization - Visit Number 3   Authorization - Number of Visits 10  [phone] to 0901  (36 min.)   Activity Tolerance Patient tolerated treatment well;Patient limited by pain    Behavior During Therapy [facility] for tasks assessed/performed          Past Medical History:  Diagnosis Date   Allergy    Chronic kidney disease    stones   Fibrocystic breast disease 2013   Meniere disease    Migraines    Past Surgical History:  Procedure Laterality Date   ABDOMINAL HYSTERECTOMY  2011   laprascopic supracervical, [place]   APPENDECTOMY  1999   BREAST BIOPSY Right Nov 2012    fibrocystic disease, [place]   CESAREAN SECTION     4 total   CHOLECYSTECTOMY  2011   [place]   Patient Active Problem List   Diagnosis Date Noted   H/O of hemilaminectomy 02/15/2019   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[place]) 11/17/2018   Fatigue 09/23/2017   History of pneumonia 04/30/2016   Grief reaction 04/30/2016   Vitamin D deficiency 02/22/2015   Hyperlipidemia 01/01/2015   Visit for preventive health examination 12/04/2013   Lower extremity edema 09/02/2013   Essential hypertension 09/02/2013   History of cardiomyopathy 09/02/2013   SVT (supraventricular tachycardia) ([place]) 09/02/2013   Palpitations 09/02/2013   Contrast media allergy 03/10/2013   Fibrocystic breast disease    Morbid obesity ([place]) 02/05/2012   Meniere disease     REFERRING DIAG:  M79.18 (ICD-10-CM) - Myalgia, other site  Z98.890 (ICD-10-CM) - Other specified postprocedural states  M54.12 (ICD-10-CM) - Radiculopathy, cervical region    THERAPY DIAG:  Joint  stiffness of spine  Pain in right ankle and joints of right foot  Muscle weakness (generalized)  Pain in right arm  Sprain and strain of ankle  PERTINENT HISTORY: See evaluation  PRECAUTIONS: N/A  SUBJECTIVE:    Pt. Reports improvement with dizziness but still has symptoms with spatial issues/ walking in hallway at work.  Pt. Has been completing vestibular ex. Prescribed by Roxana Hires, PT, DPT, [facility].  Pt. Reports neck/ upper back has been still and hurting.  Pt. Hasn't had dry needling in a couple weeks.     PAIN:  Are you having pain? Yes: NPRS scale: 5/10 Pain location: Neck Pain description: aching/ stiff/ tight Aggravating factors: prolonged sitting/ activity Relieving factors: stretches/ PT   TODAY'S TREATMENT:   09/04/21:  Manual Therapy:  Seated/supine STM to L and R upper trap, levator scapulae, posterior deltoid and mid-scapular region/ paraspinals.    Supine cervical grade II distraction - 30 s hold x 5.  Supine L/R UT, levator, cervical rotn. Stretches 5x each  L/R shoulder AAROM to end-range flexion/ abduction.    AA/PROM of c-spine, overpressure at end range - 20 s hold x 2   Seated cervical/ thoracic rotn. To tolerable end-range. 2x  Reviewed HEP  Trigger Point Dry Needling (TDN)   Education performed with patient regarding potential benefit of TDN. Reviewed precautions and risks with patient. Reviewed special precautions/risks over lung fields which include pneumothorax.  Reviewed signs and symptoms of pneumothorax and advised pt to go to ER immediately if these symptoms develop advise them of dry needling treatment. Pt provided verbal consent to treatment. TDN performed to bilateral UT/ mid-cervical paraspinals with 4, 0.25 x 40 L-type single needle placements on each side with 2 local twitch response (LTR). Pistoning technique utilized. Improved pain-free motion following intervention.  Good tx. Tolerance.  Applied Biofreeze to upper back/ neck after tx.      PATIENT EDUCATION: Education details: HEP Person educated: Patient Education method: Explanation, Media planner, and Handouts Education comprehension: verbalized understanding and returned demonstration   HOME EXERCISE PROGRAM: [id number].  New HEP: [id number]             PT LONG TERM GOAL #6    Title Pt will improve worse cervical pain by atleast 3 NPS points to allow increased tolerance during work and home related ADLs.     Baseline NPS: 8/10 current, 7/10 best, 10/10 Worse. 10/20: Worse pain 7/10     Time 8     Period Weeks     Status Partially Met     Target Date 10/02/2021           PT LONG TERM GOAL #7    Title Pt will improve bilat UE MMT by atleast 1/2 grade to facilitate greater functional abilities with ADLs related to caregiving.     Baseline UE MMT R/L:   Shoulder Flexion: 4/4-  Shoulder abduction: 4/4-  Shoulder ER: 4/4  Shoulder IR: 4/4  Elbow ext: 5/5   Eblow flex: 5/5 10/20: UE MMT R/L:   Shoulder Flexion: 4/4  Shoulder abduction: 4/4 Shoulder ER: 4/4  Shoulder IR: 4/4  Elbow ext: 5/5   Eblow flex: 5/5     Time 8     Period Weeks     Status Partially Met     Target Date 10/02/2021           PT LONG TERM GOAL #8    Title Pt will improve functional R hand strength equal to L to promote return of hand useage during grooming and feeding.     Baseline Grip R/L: 76.1 / 33.9    Lateral prehension grip R/L: 14 / 4 10/20: Grip R/L: 62.0 / 41.4    Lateral prehension grip R/L: 13 / 6.  1/12:  R 69#/ L 49.8#     Time 8     Period Weeks     Status Partially Met     Target Date 10/02/2021            Plan -     Clinical Impression Statement Tx. focus on manual stretches/ STM to cervical spine/ upper back.  No episodes of dizziness during tx. Session.  2 moderate trigger points noted in B UT musculature with no good tolerance to TDN.  Pt. understands current HEP and importance of being aware of proper posture/ body mechanics with work-related tasks and household activities.  Pt. Will focus on current UE ex./ cervical stretches to increase pain-free mobility.  Pt. Instructed to contact PT if any questions or concerns.     Personal Factors and Comorbidities Comorbidity 2;Profession;Past/Current Experience    Comorbidities Hypertension, Obesity    Examination-Activity Limitations Squat;Locomotion Level    Examination-Participation Restrictions Occupation;Cleaning;Laundry    Stability/Clinical Decision Making Evolving/Moderate complexity    Clinical Decision Making Moderate    Rehab Potential Good    PT Frequency 1x/week   PT Duration 8 weeks    PT  Treatment/Interventions ADLs/Self Care Home Management;Aquatic Therapy;Biofeedback;Cryotherapy;Electrical Stimulation;Moist Heat;Ultrasound;Functional mobility training;Therapeutic activities;Therapeutic exercise;Balance training;Neuromuscular re-education;Patient/family education;Manual techniques;Scar mobilization;Passive range of motion;Dry needling;Energy conservation;Taping;Gait training;Stair training;DME Instruction;Spinal Manipulations;Joint Manipulations, Canalith repositioning procedure.    PT Next Visit Plan Progress strengthening exercise.     PT Home Exercise Plan [email or web address].   HEP: [id number]    Consulted and Agree with Plan of Care Patient         Pura Spice, PT, DPT # [phone] 09/04/2021, 10:46 AM

## 2021-09-11 ENCOUNTER — Ambulatory Visit: Payer: BC Managed Care – PPO | Admitting: Physical Therapy

## 2021-09-18 ENCOUNTER — Ambulatory Visit: Payer: BC Managed Care – PPO | Admitting: Physical Therapy

## 2021-09-19 ENCOUNTER — Ambulatory Visit: Payer: BC Managed Care – PPO | Admitting: Physical Therapy

## 2021-09-19 DIAGNOSIS — M6281 Muscle weakness (generalized): Secondary | ICD-10-CM | POA: Diagnosis not present

## 2021-09-19 DIAGNOSIS — M79601 Pain in right arm: Secondary | ICD-10-CM

## 2021-09-19 DIAGNOSIS — M2560 Stiffness of unspecified joint, not elsewhere classified: Secondary | ICD-10-CM

## 2021-09-19 DIAGNOSIS — M25571 Pain in right ankle and joints of right foot: Secondary | ICD-10-CM | POA: Diagnosis not present

## 2021-09-19 DIAGNOSIS — S93409A Sprain of unspecified ligament of unspecified ankle, initial encounter: Secondary | ICD-10-CM | POA: Diagnosis not present

## 2021-09-19 DIAGNOSIS — S96919A Strain of unspecified muscle and tendon at ankle and foot level, unspecified foot, initial encounter: Secondary | ICD-10-CM | POA: Diagnosis not present

## 2021-09-21 NOTE — Therapy (Signed)
OUTPATIENT PHYSICAL THERAPY TREATMENT NOTE   Patient Name: Amanda Humphrey MRN: [id number] DOB:10-14-1965, 56 y.o., female Today's Date: 09/19/2021  PCP: Crecencio Mc, MD REFERRING PROVIDER: Crecencio Mc, MD     PT End of Session -     Visit Number 54   Number of Visits 78   Date for PT Re-Evaluation 10/30/21    Authorization - Visit Number 4   Authorization - Number of Visits 10  [phone] to [phone]  (44 min.)   Activity Tolerance Patient tolerated treatment well;Patient limited by pain    Behavior During Therapy [facility] for tasks assessed/performed          Past Medical History:  Diagnosis Date   Allergy    Chronic kidney disease    stones   Fibrocystic breast disease 2013   Meniere disease    Migraines    Past Surgical History:  Procedure Laterality Date   ABDOMINAL HYSTERECTOMY  2011   laprascopic supracervical, [place]   APPENDECTOMY  1999   BREAST BIOPSY Right Nov 2012    fibrocystic disease, [place]   CESAREAN SECTION     4 total   CHOLECYSTECTOMY  2011   [place]   Patient Active Problem List   Diagnosis Date Noted   H/O of hemilaminectomy 02/15/2019   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[place]) 11/17/2018   Fatigue 09/23/2017   History of pneumonia 04/30/2016   Grief reaction 04/30/2016   Vitamin D deficiency 02/22/2015   Hyperlipidemia 01/01/2015   Visit for preventive health examination 12/04/2013   Lower extremity edema 09/02/2013   Essential hypertension 09/02/2013   History of cardiomyopathy 09/02/2013   SVT (supraventricular tachycardia) ([place]) 09/02/2013   Palpitations 09/02/2013   Contrast media allergy 03/10/2013   Fibrocystic breast disease    Morbid obesity ([place]) 02/05/2012   Meniere disease     REFERRING DIAG:  M79.18 (ICD-10-CM) - Myalgia, other site  Z98.890 (ICD-10-CM) - Other specified postprocedural states  M54.12 (ICD-10-CM) - Radiculopathy, cervical region    THERAPY DIAG:  Joint stiffness of  spine  Pain in right ankle and joints of right foot  Muscle weakness (generalized)  Pain in right arm  Sprain and strain of ankle  PERTINENT HISTORY: See evaluation  PRECAUTIONS: N/A  SUBJECTIVE:    Pt. Reports 90% improvement with dizziness but still has symptoms with spatial issues/ walking in hallway at work.  Pt. Reports neck/ upper back has been still and hurting.  No recent massages reported.    PAIN:  Are you having pain? Yes: NPRS scale: 5/10 Pain location: Neck Pain description: aching/ stiff/ tight Aggravating factors: prolonged sitting/ activity Relieving factors: stretches/ PT   TODAY'S TREATMENT:   09/19/21:  Manual Therapy:   Supine cervical grade II-III distraction - 30 s hold x 5.  Supine L/R UT, levator, cervical rotn. Stretches 5x each  L/R shoulder AAROM to end-range flexion/ abduction.    Supine STM to L and R upper trap, levator scapulae, posterior deltoid and mid-scapular region/ paraspinals.   AA/PROM of c-spine, overpressure at end range - 20 s hold x 3   Seated cervical/ thoracic rotn. To tolerable end-range. 2x  Reviewed HEP  Trigger Point Dry Needling (TDN)   Education performed with patient regarding potential benefit of TDN. Reviewed precautions and risks with patient. Reviewed special precautions/risks over lung fields which include pneumothorax. Reviewed signs and symptoms of pneumothorax and advised pt to go to ER immediately if these symptoms develop advise them of dry  needling treatment. Pt provided verbal consent to treatment. TDN performed to L UT/ mid-cervical paraspinals with 5, 0.25 x 40 L-type single needle placements on each side with 2 local twitch response (LTR). Pistoning technique utilized. Improved pain-free motion following intervention.  Good tx. Tolerance.  Applied Biofreeze to upper back/ neck after tx.     PATIENT EDUCATION: Education details: HEP Person educated: Patient Education method: Explanation,  Media planner, and Handouts Education comprehension: verbalized understanding and returned demonstration   HOME EXERCISE PROGRAM: [id number].  New HEP: [id number]             PT LONG TERM GOAL #6    Title Pt will improve worse cervical pain by atleast 3 NPS points to allow increased tolerance during work and home related ADLs.     Baseline NPS: 8/10 current, 7/10 best, 10/10 Worse. 10/20: Worse pain 7/10     Time 8     Period Weeks     Status Partially Met     Target Date 10/02/2021           PT LONG TERM GOAL #7    Title Pt will improve bilat UE MMT by atleast 1/2 grade to facilitate greater functional abilities with ADLs related to caregiving.     Baseline UE MMT R/L:   Shoulder Flexion: 4/4-  Shoulder abduction: 4/4-  Shoulder ER: 4/4  Shoulder IR: 4/4  Elbow ext: 5/5   Eblow flex: 5/5 10/20: UE MMT R/L:   Shoulder Flexion: 4/4  Shoulder abduction: 4/4 Shoulder ER: 4/4  Shoulder IR: 4/4  Elbow ext: 5/5   Eblow flex: 5/5     Time 8     Period Weeks     Status Partially Met     Target Date 10/02/2021           PT LONG TERM GOAL #8    Title Pt will improve functional R hand strength equal to L to promote return of hand useage during grooming and feeding.     Baseline Grip R/L: 76.1 / 33.9    Lateral prehension grip R/L: 14 / 4 10/20: Grip R/L: 62.0 / 41.4    Lateral prehension grip R/L: 13 / 6.  1/12:  R 69#/ L 49.8#     Time 8     Period Weeks     Status Partially Met     Target Date 10/02/2021            Plan -     Clinical Impression Statement Tx. focus on manual stretches/ STM to cervical spine/ upper back.  No episodes of dizziness during tx. Session.  1 moderate trigger points noted in L UT musculature with no good tolerance to TDN.  Pt. understands current HEP and importance of being aware of proper posture/ body mechanics with work-related tasks and household activities. Pt. Will focus on current UE ex./ cervical stretches to increase pain-free mobility.  Pt. Instructed to  contact PT if any questions or concerns.     Personal Factors and Comorbidities Comorbidity 2;Profession;Past/Current Experience    Comorbidities Hypertension, Obesity    Examination-Activity Limitations Squat;Locomotion Level    Examination-Participation Restrictions Occupation;Cleaning;Laundry    Stability/Clinical Decision Making Evolving/Moderate complexity    Clinical Decision Making Moderate    Rehab Potential Good    PT Frequency 1x/week   PT Duration 8 weeks    PT Treatment/Interventions ADLs/Self Care Home Management;Aquatic Therapy;Biofeedback;Cryotherapy;Electrical Stimulation;Moist Heat;Ultrasound;Functional mobility training;Therapeutic activities;Therapeutic exercise;Balance training;Neuromuscular re-education;Patient/family education;Manual techniques;Scar mobilization;Passive range of motion;Dry needling;Energy conservation;Taping;Gait  training;Stair training;DME Instruction;Spinal Manipulations;Joint Manipulations, Canalith repositioning procedure.    PT Next Visit Plan Progress strengthening exercise.     PT Home Exercise Plan [id number].   HEP: [id number]    Consulted and Agree with Plan of Care Patient         Pura Spice, PT, DPT # [phone] 09/21/2021, 7:21 AM

## 2021-09-25 ENCOUNTER — Ambulatory Visit: Payer: BC Managed Care – PPO | Admitting: Physical Therapy

## 2021-10-02 ENCOUNTER — Ambulatory Visit: Payer: BC Managed Care – PPO | Attending: Internal Medicine | Admitting: Physical Therapy

## 2021-10-02 DIAGNOSIS — M79601 Pain in right arm: Secondary | ICD-10-CM | POA: Insufficient documentation

## 2021-10-02 DIAGNOSIS — M6281 Muscle weakness (generalized): Secondary | ICD-10-CM | POA: Insufficient documentation

## 2021-10-02 DIAGNOSIS — M25571 Pain in right ankle and joints of right foot: Secondary | ICD-10-CM | POA: Insufficient documentation

## 2021-10-02 DIAGNOSIS — M2560 Stiffness of unspecified joint, not elsewhere classified: Secondary | ICD-10-CM | POA: Insufficient documentation

## 2021-10-05 NOTE — Therapy (Signed)
OUTPATIENT PHYSICAL THERAPY TREATMENT NOTE   Patient Name: Amanda Humphrey MRN: [id number] DOB:11/07/1965, 56 y.o., female Today's Date: 10/02/2021  PCP: Tullo, Teresa L, MD REFERRING PROVIDER: Tullo, Teresa L, MD     PT End of Session -     Visit Number 55   Number of Visits 76   Date for PT Re-Evaluation 10/30/21    Authorization - Visit Number 5   Authorization - Number of Visits 10  [phone] to 1028  (50 min.)   Activity Tolerance Patient tolerated treatment well;Patient limited by pain    Behavior During Therapy [facility] for tasks assessed/performed          Past Medical History:  Diagnosis Date   Allergy    Chronic kidney disease    stones   Fibrocystic breast disease 2013   Meniere disease    Migraines    Past Surgical History:  Procedure Laterality Date   ABDOMINAL HYSTERECTOMY  2011   laprascopic supracervical, [place]   APPENDECTOMY  1999   BREAST BIOPSY Right Nov 2012    fibrocystic disease, [place]   CESAREAN SECTION     4 total   CHOLECYSTECTOMY  2011   TONSILLECTOMY AND ADENOIDECTOMY  1975   Patient Active Problem List   Diagnosis Date Noted   H/O of hemilaminectomy 02/15/2019   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   Fatigue 09/23/2017   History of pneumonia 04/30/2016   Grief reaction 04/30/2016   Vitamin D deficiency 02/22/2015   Hyperlipidemia 01/01/2015   Visit for preventive health examination 12/04/2013   Lower extremity edema 09/02/2013   Essential hypertension 09/02/2013   History of cardiomyopathy 09/02/2013   SVT (supraventricular tachycardia) ([facility]) 09/02/2013   Palpitations 09/02/2013   Contrast media allergy 03/10/2013   Fibrocystic breast disease    Morbid obesity ([facility]) 02/05/2012   Meniere disease     REFERRING DIAG:  M79.18 (ICD-10-CM) - Myalgia, other site  Z98.890 (ICD-10-CM) - Other specified postprocedural states  M54.12 (ICD-10-CM) - Radiculopathy, cervical region    THERAPY DIAG:  Joint stiffness of  spine  Pain in right ankle and joints of right foot  Muscle weakness (generalized)  Pain in right arm  PERTINENT HISTORY: See evaluation  PRECAUTIONS: N/A  SUBJECTIVE:    Pt. 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  Pt. Will be out of town this weekend/ next week for work in [place].  Pt. Reports neck/ upper back symptoms and tightness in B UT musculature.     PAIN:  Are you having pain? Yes: NPRS scale: 5/10 Pain location: Neck Pain description: aching/ stiff/ tight Aggravating factors: prolonged sitting/ activity Relieving factors: stretches/ PT   TODAY'S TREATMENT:   10/05/21:  Manual Therapy:   Seated B shoulder/ cervical AROM (all planes)- discomfort with cervical rotn.    Supine cervical grade II-III distraction - 30 s hold x 5.  Supine L/R UT, levator, cervical rotn. Stretches 5x each  L/R shoulder AAROM to end-range flexion/ abduction.    Supine and prone STM to L and R upper trap, levator scapulae, posterior deltoid and mid-scapular region/ paraspinals.   Reviewed HEP  Trigger Point Dry Needling (TDN)   Education performed with patient regarding potential benefit of TDN. Reviewed precautions and risks with patient. Reviewed special precautions/risks over lung fields which include pneumothorax. Reviewed signs and symptoms of pneumothorax and advised pt to go to ER immediately if these symptoms develop advise them of dry needling   treatment. Pt provided verbal consent to treatment. TDN performed to L UT/ mid-cervical paraspinals with 5, 0.25 x 40 L-type single needle placements on each side with 2 local twitch response (LTR). Pistoning technique utilized. Improved pain-free motion following intervention.  Good tx. Tolerance.  Applied Biofreeze to upper back/ neck after tx.     PATIENT EDUCATION: Education details: HEP Person educated: Patient Education method: Explanation, Media planner, and Handouts Education  comprehension: verbalized understanding and returned demonstration   HOME EXERCISE PROGRAM: [id number].  New HEP: [id number]             PT LONG TERM GOAL #6    Title Pt will improve worse cervical pain by atleast 3 NPS points to allow increased tolerance during work and home related ADLs.     Baseline NPS: 8/10 current, 7/10 best, 10/10 Worse. 10/20: Worse pain 7/10     Time 8     Period Weeks     Status Partially Met     Target Date 10/30/2021           PT LONG TERM GOAL #7    Title Pt will improve bilat UE MMT by atleast 1/2 grade to facilitate greater functional abilities with ADLs related to caregiving.     Baseline UE MMT R/L:   Shoulder Flexion: 4/4-  Shoulder abduction: 4/4-  Shoulder ER: 4/4  Shoulder IR: 4/4  Elbow ext: 5/5   Eblow flex: 5/5 10/20: UE MMT R/L:   Shoulder Flexion: 4/4  Shoulder abduction: 4/4 Shoulder ER: 4/4  Shoulder IR: 4/4  Elbow ext: 5/5   Eblow flex: 5/5     Time 8     Period Weeks     Status Partially Met     Target Date 10/30/2021           PT LONG TERM GOAL #8    Title Pt will improve functional R hand strength equal to L to promote return of hand useage during grooming and feeding.     Baseline Grip R/L: 76.1 / 33.9    Lateral prehension grip R/L: 14 / 4 10/20: Grip R/L: 62.0 / 41.4    Lateral prehension grip R/L: 13 / 6.  1/12:  R 69#/ L 49.8#     Time 8     Period Weeks     Status Partially Met     Target Date 10/30/2021            Plan -     Clinical Impression Statement Tx. focus on manual stretches/ STM to cervical spine/ upper back.  Decrease noted in overall UT  trigger points and good tolerance to TDN/ STM.  Pain limited with cervical rotn. In seated posture with hypomobility noted.  Pt. understands current HEP and importance of being aware of proper posture/ body mechanics with work-related tasks and household activities. Pt. Will focus on current UE ex./ cervical stretches to increase pain-free mobility.  Pt. Instructed to contact PT if  any questions or concerns.     Personal Factors and Comorbidities Comorbidity 2;Profession;Past/Current Experience    Comorbidities Hypertension, Obesity    Examination-Activity Limitations Squat;Locomotion Level    Examination-Participation Restrictions Occupation;Cleaning;Laundry    Stability/Clinical Decision Making Evolving/Moderate complexity    Clinical Decision Making Moderate    Rehab Potential Good    PT Frequency 1x/week   PT Duration 8 weeks    PT Treatment/Interventions ADLs/Self Care Home Management;Aquatic Therapy;Biofeedback;Cryotherapy;Electrical Stimulation;Moist Heat;Ultrasound;Functional mobility training;Therapeutic activities;Therapeutic exercise;Balance training;Neuromuscular re-education;Patient/family education;Manual techniques;Scar mobilization;Passive range of motion;Dry  needling;Energy conservation;Taping;Gait training;Stair training;DME Instruction;Spinal Manipulations;Joint Manipulations, Canalith repositioning procedure.    PT Next Visit Plan Progress strengthening exercise.     PT Home Exercise Plan [id number].   HEP: [id number]    Consulted and Agree with Plan of Care Patient         Pura Spice, PT, DPT # [phone] 10/05/2021, 1:10 PM

## 2021-10-11 ENCOUNTER — Ambulatory Visit: Payer: BC Managed Care – PPO | Admitting: Physical Therapy

## 2021-10-11 DIAGNOSIS — M79601 Pain in right arm: Secondary | ICD-10-CM | POA: Diagnosis not present

## 2021-10-11 DIAGNOSIS — M25571 Pain in right ankle and joints of right foot: Secondary | ICD-10-CM | POA: Diagnosis not present

## 2021-10-11 DIAGNOSIS — M2560 Stiffness of unspecified joint, not elsewhere classified: Secondary | ICD-10-CM | POA: Diagnosis not present

## 2021-10-11 DIAGNOSIS — M6281 Muscle weakness (generalized): Secondary | ICD-10-CM | POA: Diagnosis not present

## 2021-10-13 ENCOUNTER — Ambulatory Visit
Admission: EM | Admit: 2021-10-13 | Discharge: 2021-10-13 | Disposition: A | Discharge status: Home / Self Care | Payer: BC Managed Care – PPO | Attending: Family Medicine | Admitting: Family Medicine

## 2021-10-13 DIAGNOSIS — U071 COVID-19: Secondary | ICD-10-CM | POA: Insufficient documentation

## 2021-10-13 LAB — RESP PANEL BY RT-PCR (FLU A&B, COVID) ARPGX2
Influenza A by PCR: NEGATIVE
Influenza B by PCR: NEGATIVE
SARS Coronavirus 2 by RT PCR: POSITIVE — AB

## 2021-10-13 LAB — GROUP A STREP BY PCR: Group A Strep by PCR: NOT DETECTED

## 2021-10-13 MED ORDER — NIRMATRELVIR/RITONAVIR (PAXLOVID)TABLET: ORAL_TABLET | ORAL | 0 refills | Status: DC

## 2021-10-13 NOTE — Discharge Instructions (Signed)
Medication as prescribed.  Stay home.  If you worsen, please let us know.  Take care  Dr. Lacinda Axon

## 2021-10-13 NOTE — ED Triage Notes (Signed)
Pt reports fever, congestion, cough,  sore throat, achy, bilateral ear pain and chest hurts x day 4,

## 2021-10-13 NOTE — ED Provider Notes (Signed)
[facility]    CSN: [id number] Arrival date & time: 10/13/21  1136      History   Chief Complaint Chief Complaint  Patient presents with   URI    Fever, congestion, cough and headache - Entered by patient   Cough    HPI 56 year old female presents for evaluation of the above. Reports that she has been sick for the past 4 days.  Recent travel to [place].  Reports fever, congestion, cough, sore throat, body aches, ear pain, chest discomfort.  Pain currently 6/10 in severity.  No relieving factors.  No other associated symptoms.  No other complaints.  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   Fatigue 09/23/2017   History of pneumonia 04/30/2016   Grief reaction 04/30/2016   Vitamin D deficiency 02/22/2015   Hyperlipidemia 01/01/2015   Visit for preventive health examination 12/04/2013   Lower extremity edema 09/02/2013   Essential hypertension 09/02/2013   History of cardiomyopathy 09/02/2013   SVT (supraventricular tachycardia) (HCC) 09/02/2013   Palpitations 09/02/2013   Contrast media allergy 03/10/2013   Fibrocystic breast disease    Morbid obesity (HCC) 02/05/2012   Meniere disease     Past Surgical History:  Procedure Laterality Date   ABDOMINAL HYSTERECTOMY  2011   laprascopic supracervical, [place]   APPENDECTOMY  1999   BREAST BIOPSY Right Nov 2012    fibrocystic disease, [place]   CESAREAN SECTION     4 total   CHOLECYSTECTOMY  2011   TONSILLECTOMY AND ADENOIDECTOMY  1975    OB History   No obstetric history on file.      Home Medications    Prior to Admission medications   Medication Sig Start Date End Date Taking? Authorizing Provider  nirmatrelvir/ritonavir EUA  (PAXLOVID) 20 x 150 MG & 10 x 100MG  TABS Take nirmatrelvir (150 mg) two tablets twice daily for 5 days and ritonavir (100 mg) one tablet twice daily for 5 days. GFR > 60. 10/13/21  Yes Andrei Mccook G, DO  amphetamine-dextroamphetamine (ADDERALL) 20 MG tablet Take 20 mg by mouth every evening.  09/22/18   [provider]  carvedilol (COREG) 6.25 MG tablet TAKE 1 TABLET TWICE A DAY 08/29/21   Gollan, 10/29/21, MD  cyclobenzaprine (FLEXERIL) 5 MG tablet Take 5 mg by mouth at bedtime. 04/28/19   [provider]  ergocalciferol (DRISDOL) 1.25 MG (50000 UT) capsule Take 1 capsule (50,000 Units total) by mouth once a week. 03/21/21   03/23/21, MD  ezetimibe (ZETIA) 10 MG tablet Take 1 tablet (10 mg total) by mouth daily. 08/20/20   08/22/20, MD  furosemide (LASIX) 20 MG tablet Take 1 tablet (20 mg total) by mouth daily as needed. CALL AND SCHEDULE OVERDUE FOLLOW UP VISIT TO RECEIVE FURTHER REFILLS. 1ST ATTEMPT. 08/15/21   08/17/21, MD  hydrochlorothiazide (HYDRODIURIL) 25 MG tablet TAKE 1 TABLET DAILY 08/29/21   10/29/21, MD  ipratropium (ATROVENT) 0.06 % nasal spray Place 2 sprays into both nostrils 4 (four) times daily. 07/14/21   07/16/21, NP  meclizine (ANTIVERT) 25 MG tablet Take 1 tablet (25 mg total) by mouth 3 (three) times daily as  needed for dizziness. 07/19/21   Brunetta Jeans, PA-C  methocarbamol (ROBAXIN) 500 MG tablet TAKE 1 TABLET TWICE A DAY 03/07/19   Crecencio Mc, MD  methylPREDNISolone (MEDROL DOSEPAK) 4 MG TBPK tablet Take following package directions 07/19/21   Brunetta Jeans, PA-C  omeprazole (PRILOSEC) 40 MG capsule TAKE 1 CAPSULE DAILY 08/12/21   Crecencio Mc, MD  ondansetron (ZOFRAN ODT) 4 MG disintegrating tablet Take 1 tablet (4 mg total) by mouth every 8 (eight) hours as needed for nausea or vomiting. 04/10/20   Brunetta Jeans, PA-C  ondansetron (ZOFRAN-ODT) 8 MG disintegrating tablet Take 1 tablet (8 mg total) by mouth every 8  (eight) hours as needed for nausea or vomiting. 07/14/21   Margarette Canada, NP  rosuvastatin (CRESTOR) 5 MG tablet Take 1 tablet (5 mg total) by mouth daily. 08/20/20   Minna Merritts, MD    Family History Family History  Problem Relation Age of Onset   Hypertension Mother    Cancer Mother        ovarian   Arthritis Mother    Hyperlipidemia Father    Hypertension Father    Diabetes Father    Stroke Father    Alzheimer's disease Maternal Grandmother    Heart disease Maternal Grandmother    Breast cancer Maternal Grandmother    Alzheimer's disease Paternal Grandmother    Breast cancer Paternal Grandmother     Social History Social History   Tobacco Use   Smoking status: Never   Smokeless tobacco: Never  Vaping Use   Vaping Use: Never used  Substance Use Topics   Alcohol use: Yes    Comment: social   Drug use: No     Allergies   Bee venom, Codeine, Hydrocodone, Tramadol, and Oxycodone   Review of Systems Review of Systems Per HPI  Physical Exam Triage Vital Signs ED Triage Vitals  Enc Vitals Group     BP 10/13/21 1150 125/77     Pulse Rate 10/13/21 1150 93     Resp 10/13/21 1150 20     Temp 10/13/21 1150 98.4 F (36.9 C)     Temp Source 10/13/21 1150 Oral     SpO2 10/13/21 1150 99 %     Weight 10/13/21 1154 242 lb (109.8 kg)     Height 10/13/21 1154 5\' 9"  (1.753 m)     Head Circumference --      Peak Flow --      Pain Score 10/13/21 1154 6     Pain Loc --      Pain Edu? --      Excl. in [place]? --    Updated Vital Signs BP 125/77 (BP Location: Left Arm)   Pulse 93   Temp 98.4 F (36.9 C) (Oral)   Resp 20   Ht 5\' 9"  (1.753 m)   Wt 109.8 kg   SpO2 99%   BMI 35.74 kg/m   Visual Acuity Right Eye Distance:   Left Eye Distance:   Bilateral Distance:    Right Eye Near:   Left Eye Near:    Bilateral Near:     Physical Exam Vitals and nursing note reviewed.  Constitutional:      General: She is not in acute distress.    Comments: Appears mildly  ill.   HENT:     Head: Normocephalic and atraumatic.     Right Ear: Tympanic membrane normal.     Left Ear: Tympanic membrane normal.  Mouth/Throat:     Pharynx: Oropharynx is clear.  Eyes:     General:        Right eye: No discharge.        Left eye: No discharge.     Conjunctiva/sclera: Conjunctivae normal.  Cardiovascular:     Rate and Rhythm: Normal rate and regular rhythm.  Pulmonary:     Effort: Pulmonary effort is normal.     Breath sounds: Normal breath sounds. No wheezing, rhonchi or rales.  Neurological:     Mental Status: She is alert.      UC Treatments / Results  Labs (all labs ordered are listed, but only abnormal results are displayed) Labs Reviewed  RESP PANEL BY RT-PCR (FLU A&B, COVID) ARPGX2 - Abnormal; Notable for the following components:      Result Value   SARS Coronavirus 2 by RT PCR POSITIVE (*)    All other components within normal limits  GROUP A STREP BY PCR  COVID-19, FLU A+B AND RSV    EKG   Radiology No results found.  Procedures Procedures (including critical care time)  Medications Ordered in UC Medications - No data to display  Initial Impression / Assessment and Plan / UC Course  I have reviewed the triage vital signs and the nursing notes.  Pertinent labs & imaging results that were available during my care of the patient were reviewed by me and considered in my medical decision making (see chart for details).    56 year old female presents with COVID-19.  Acute illness with systemic symptoms given ongoing fever.  Treating with Paxlovid.  Final Clinical Impressions(s) / UC Diagnoses   Final diagnoses:  COVID     Discharge Instructions      Medication as prescribed.  Stay home.  If you worsen, please let us know.  Take care  Dr. Adriana Simas    ED Prescriptions     Medication Sig Dispense Auth. Provider   nirmatrelvir/ritonavir EUA (PAXLOVID) 20 x 150 MG & 10 x 100MG  TABS Take nirmatrelvir (150 mg) two tablets  twice daily for 5 days and ritonavir (100 mg) one tablet twice daily for 5 days. GFR > 60. 30 tablet , DO      PDMP not reviewed this encounter.   Tommie Sams, Tommie Sams 10/13/21 [phone]

## 2021-10-14 ENCOUNTER — Ambulatory Visit: Payer: BC Managed Care – PPO

## 2021-10-17 ENCOUNTER — Telehealth: Payer: BC Managed Care – PPO | Admitting: Physician Assistant

## 2021-10-17 DIAGNOSIS — U071 COVID-19: Secondary | ICD-10-CM | POA: Diagnosis not present

## 2021-10-17 DIAGNOSIS — H6503 Acute serous otitis media, bilateral: Secondary | ICD-10-CM

## 2021-10-17 MED ORDER — IPRATROPIUM BROMIDE 0.06 % NA SOLN: 2.0000 | Freq: Four times a day (QID) | NASAL | 0 refills | Status: DC

## 2021-10-17 MED ORDER — PREDNISONE 10 MG (21) PO TBPK: ORAL_TABLET | ORAL | 0 refills | Status: DC

## 2021-10-17 NOTE — Patient Instructions (Signed)
Amanda Humphrey, thank you for joining [name], PA-C for today's virtual visit.  While this provider is not your primary care provider (PCP), if your PCP is located in our provider database this encounter information will be shared with them immediately following your visit.  Consent: (Patient) Amanda Humphrey provided verbal consent for this virtual visit at the beginning of the encounter.  Current Medications:  Current Outpatient Medications:    amphetamine-dextroamphetamine (ADDERALL) 20 MG tablet, Take 20 mg by mouth every evening. , Disp: , Rfl:    carvedilol (COREG) 6.25 MG tablet, TAKE 1 TABLET TWICE A DAY, Disp: 60 tablet, Rfl: 0   cyclobenzaprine (FLEXERIL) 5 MG tablet, Take 5 mg by mouth at bedtime., Disp: , Rfl:    ergocalciferol (DRISDOL) 1.25 MG (50000 UT) capsule, Take 1 capsule (50,000 Units total) by mouth once a week., Disp: 12 capsule, Rfl: 4   ezetimibe (ZETIA) 10 MG tablet, Take 1 tablet (10 mg total) by mouth daily., Disp: 90 tablet, Rfl: 3   furosemide (LASIX) 20 MG tablet, Take 1 tablet (20 mg total) by mouth daily as needed. CALL AND SCHEDULE OVERDUE FOLLOW UP VISIT TO RECEIVE FURTHER REFILLS. 1ST ATTEMPT., Disp: 30 tablet, Rfl: 0   hydrochlorothiazide (HYDRODIURIL) 25 MG tablet, TAKE 1 TABLET DAILY, Disp: 30 tablet, Rfl: 0   ipratropium (ATROVENT) 0.06 % nasal spray, Place 2 sprays into both nostrils 4 (four) times daily., Disp: 15 mL, Rfl: 12   meclizine (ANTIVERT) 25 MG tablet, Take 1 tablet (25 mg total) by mouth 3 (three) times daily as needed for dizziness., Disp: 30 tablet, Rfl: 0   methocarbamol (ROBAXIN) 500 MG tablet, TAKE 1 TABLET TWICE A DAY, Disp: 90 tablet, Rfl: 7   methylPREDNISolone (MEDROL DOSEPAK) 4 MG TBPK tablet, Take following package directions, Disp: 21 tablet, Rfl: 0   nirmatrelvir/ritonavir EUA (PAXLOVID) 20 x 150 MG & 10 x 100MG  TABS, Take nirmatrelvir (150 mg) two tablets twice daily for 5 days and ritonavir (100 mg) one tablet twice  daily for 5 days. GFR > 60., Disp: 30 tablet, Rfl: 0   omeprazole (PRILOSEC) 40 MG capsule, TAKE 1 CAPSULE DAILY, Disp: 90 capsule, Rfl: 3   ondansetron (ZOFRAN ODT) 4 MG disintegrating tablet, Take 1 tablet (4 mg total) by mouth every 8 (eight) hours as needed for nausea or vomiting., Disp: 20 tablet, Rfl: 0   ondansetron (ZOFRAN-ODT) 8 MG disintegrating tablet, Take 1 tablet (8 mg total) by mouth every 8 (eight) hours as needed for nausea or vomiting., Disp: 20 tablet, Rfl: 0   rosuvastatin (CRESTOR) 5 MG tablet, Take 1 tablet (5 mg total) by mouth daily., Disp: 90 tablet, Rfl: 3   Medications ordered in this encounter:  No orders of the defined types were placed in this encounter.    *If you need refills on other medications prior to your next appointment, please contact your pharmacy*  Follow-Up: Call back or seek an in-person evaluation if the symptoms worsen or if the condition fails to improve as anticipated.  [facility] [phone]  Other Instructions Please finish entire course of your antiviral. Increase fluids and rest. Restart the Atrovent nasal spray. Take the steroid as directed. If ear pressure/popping is not improving/resolving or any new/worsening symptoms despite treatment, I want you to be evaluated in person.    If you have been instructed to have an in-person evaluation today at a local Urgent Care facility, please use the link below. It will take you to  a list of all of our available [facility], including address, phone number and hours of operation. Please do not delay care.  [facility]  If you or a family member do not have a primary care provider, use the link below to schedule a visit and establish care. When you choose a [facility] primary care physician or advanced practice provider, you gain a long-term partner in health. Find a Primary Care Provider  Learn more about [facility]'s in-office and virtual care  options: [facility] - Get Care Now

## 2021-10-17 NOTE — Progress Notes (Signed)
Virtual Visit Consent   Amanda Humphrey, you are scheduled for a virtual visit with a [facility] provider today. Just as with appointments in the office, your consent must be obtained to participate. Your consent will be active for this visit and any virtual visit you may have with one of our providers in the next 365 days. If you have a MyChart account, a copy of this consent can be sent to you electronically.  As this is a virtual visit, video technology does not allow for your provider to perform a traditional examination. This may limit your provider's ability to fully assess your condition. If your provider identifies any concerns that need to be evaluated in person or the need to arrange testing (such as labs, EKG, etc.), we will make arrangements to do so. Although advances in technology are sophisticated, we cannot ensure that it will always work on either your end or our end. If the connection with a video visit is poor, the visit may have to be switched to a telephone visit. With either a video or telephone visit, we are not always able to ensure that we have a secure connection.  By engaging in this virtual visit, you consent to the provision of healthcare and authorize for your insurance to be billed (if applicable) for the services provided during this visit. Depending on your insurance coverage, you may receive a charge related to this service.  I need to obtain your verbal consent now. Are you willing to proceed with your visit today? Amanda Humphrey has provided verbal consent on 10/17/2021 for a virtual visit (video or telephone). Amanda Humphrey, [place]  Date: 10/17/2021 1:55 PM  Virtual Visit via Video Note   I, Amanda Humphrey, connected with  Amanda Humphrey  ([id number], Jan 02, 1966) on 10/17/21 at  1:15 PM EDT by a video-enabled telemedicine application and verified that I am speaking with the correct person using two identifiers.  Location: Patient: Virtual Visit Location  Patient: Home Provider: Virtual Visit Location Provider: Home Office   I discussed the limitations of evaluation and management by telemedicine and the availability of in person appointments. The patient expressed understanding and agreed to proceed.    History of Present Illness: Amanda Humphrey is a 56 y.o. who identifies as a female who was assigned female at birth, and is being seen today for concern of a worsening eustachian tube function or possible AOM after COVID diagnosis.  Was diagnosed with COVID at [facility] on 10/13/2021 at which time she was also noted to have serous fluid behind TM bilaterally without evidence of infection. Was started on Paxlovid which she has been taking as directed with improvement in most of her symptoms and resolution of fever. O2 96-97% on RA at home. Notes the ear pressure and popping with discomfort continues and has previously required steroids to combat this.Marland Kitchen   HPI: HPI  Problems:  Patient Active Problem List   Diagnosis Date Noted   H/O of hemilaminectomy 02/15/2019   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[place]) 11/17/2018   Fatigue 09/23/2017   History of pneumonia 04/30/2016   Grief reaction 04/30/2016   Vitamin D deficiency 02/22/2015   Hyperlipidemia 01/01/2015   Visit for preventive health examination 12/04/2013   Lower extremity edema 09/02/2013   Essential hypertension 09/02/2013   History of cardiomyopathy 09/02/2013   SVT (supraventricular tachycardia) ([place]) 09/02/2013   Palpitations 09/02/2013   Contrast media allergy 03/10/2013   Fibrocystic breast disease    Morbid obesity (  HCC) 02/05/2012   Meniere disease     Allergies:  Allergies  Allergen Reactions   Bee Venom Anaphylaxis   Codeine     Rash, Throat swelling,    Hydrocodone Anaphylaxis   Tramadol     Itching, facial swelling   Oxycodone Rash   Medications:  Current Outpatient Medications:    predniSONE (STERAPRED UNI-PAK 21 TAB) 10 MG (21) TBPK tablet, Take following package  directions, Disp: 21 tablet, Rfl: 0   amphetamine-dextroamphetamine (ADDERALL) 20 MG tablet, Take 20 mg by mouth every evening. , Disp: , Rfl:    carvedilol (COREG) 6.25 MG tablet, TAKE 1 TABLET TWICE A DAY, Disp: 60 tablet, Rfl: 0   cyclobenzaprine (FLEXERIL) 5 MG tablet, Take 5 mg by mouth at bedtime., Disp: , Rfl:    ergocalciferol (DRISDOL) 1.25 MG (50000 UT) capsule, Take 1 capsule (50,000 Units total) by mouth once a week., Disp: 12 capsule, Rfl: 4   ezetimibe (ZETIA) 10 MG tablet, Take 1 tablet (10 mg total) by mouth daily., Disp: 90 tablet, Rfl: 3   furosemide (LASIX) 20 MG tablet, Take 1 tablet (20 mg total) by mouth daily as needed. CALL AND SCHEDULE OVERDUE FOLLOW UP VISIT TO RECEIVE FURTHER REFILLS. 1ST ATTEMPT., Disp: 30 tablet, Rfl: 0   hydrochlorothiazide (HYDRODIURIL) 25 MG tablet, TAKE 1 TABLET DAILY, Disp: 30 tablet, Rfl: 0   ipratropium (ATROVENT) 0.06 % nasal spray, Place 2 sprays into both nostrils 4 (four) times daily., Disp: 15 mL, Rfl: 0   methocarbamol (ROBAXIN) 500 MG tablet, TAKE 1 TABLET TWICE A DAY, Disp: 90 tablet, Rfl: 7   nirmatrelvir/ritonavir EUA (PAXLOVID) 20 x 150 MG & 10 x 100MG  TABS, Take nirmatrelvir (150 mg) two tablets twice daily for 5 days and ritonavir (100 mg) one tablet twice daily for 5 days. GFR > 60., Disp: 30 tablet, Rfl: 0   omeprazole (PRILOSEC) 40 MG capsule, TAKE 1 CAPSULE DAILY, Disp: 90 capsule, Rfl: 3   ondansetron (ZOFRAN ODT) 4 MG disintegrating tablet, Take 1 tablet (4 mg total) by mouth every 8 (eight) hours as needed for nausea or vomiting., Disp: 20 tablet, Rfl: 0   ondansetron (ZOFRAN-ODT) 8 MG disintegrating tablet, Take 1 tablet (8 mg total) by mouth every 8 (eight) hours as needed for nausea or vomiting., Disp: 20 tablet, Rfl: 0   rosuvastatin (CRESTOR) 5 MG tablet, Take 1 tablet (5 mg total) by mouth daily., Disp: 90 tablet, Rfl: 3  Observations/Objective: Patient is well-developed, well-nourished in no acute distress.  Resting  comfortably at home.  Head is normocephalic, atraumatic.  No labored breathing. Speech is clear and coherent with logical content.  Patient is alert and oriented at baseline.   Assessment and Plan: 1. Non-recurrent acute serous otitis media of both ears - ipratropium (ATROVENT) 0.06 % nasal spray; Place 2 sprays into both nostrils 4 (four) times daily.  Dispense: 15 mL; Refill: 0 - predniSONE (STERAPRED UNI-PAK 21 TAB) 10 MG (21) TBPK tablet; Take following package directions  Dispense: 21 tablet; Refill: 0  2. COVID-19  COVID symptoms improving/resolving overall with antiviral. Supportive measures and OTC medications reviewed. Progressing ETD which she has had substantial issue with in the past. Will have her restart Atrovent nasal spray and will give steroid taper. Strict in-person follow-up discussed.   Follow Up Instructions: I discussed the assessment and treatment plan with the patient. The patient was provided an opportunity to ask questions and all were answered. The patient agreed with the plan and demonstrated an understanding of  the instructions.  A copy of instructions were sent to the patient via MyChart unless otherwise noted below.   The patient was advised to call back or seek an in-person evaluation if the symptoms worsen or if the condition fails to improve as anticipated.  Time:  I spent 10 minutes with the patient via telehealth technology discussing the above problems/concerns.    Amanda Rio, PA-C

## 2021-10-17 NOTE — Therapy (Signed)
OUTPATIENT PHYSICAL THERAPY TREATMENT NOTE   Patient Name: Amanda Humphrey MRN: [id number] DOB:[birth year], 56 y.o., female Today's Date: 10/11/2021  PCP: Crecencio Mc, MD REFERRING PROVIDER: Crecencio Mc, MD     PT End of Session -     Visit Number 56   Number of Visits 50   Date for PT Re-Evaluation 10/30/21    Authorization - Visit Number 6   Authorization - Number of Visits 10  [phone] to 1603  (49 min.)   Activity Tolerance Patient tolerated treatment well;Patient limited by pain    Behavior During Therapy [facility] for tasks assessed/performed          Past Medical History:  Diagnosis Date   Allergy    Chronic kidney disease    stones   Fibrocystic breast disease 2013   Meniere disease    Migraines    Past Surgical History:  Procedure Laterality Date   ABDOMINAL HYSTERECTOMY  2011   laprascopic supracervical, [place]   APPENDECTOMY  1999   BREAST BIOPSY Right Nov 2012    fibrocystic disease, [place]   CESAREAN SECTION     4 total   CHOLECYSTECTOMY  2011   [place]   Patient Active Problem List   Diagnosis Date Noted   H/O of hemilaminectomy 02/15/2019   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[place]) 11/17/2018   Fatigue 09/23/2017   History of pneumonia 04/30/2016   Grief reaction 04/30/2016   Vitamin D deficiency 02/22/2015   Hyperlipidemia 01/01/2015   Visit for preventive health examination 12/04/2013   Lower extremity edema 09/02/2013   Essential hypertension 09/02/2013   History of cardiomyopathy 09/02/2013   SVT (supraventricular tachycardia) ([place]) 09/02/2013   Palpitations 09/02/2013   Contrast media allergy 03/10/2013   Fibrocystic breast disease    Morbid obesity ([place]) 02/05/2012   Meniere disease     REFERRING DIAG:  M79.18 (ICD-10-CM) - Myalgia, other site  Z98.890 (ICD-10-CM) - Other specified postprocedural states  M54.12 (ICD-10-CM) - Radiculopathy, cervical region    THERAPY DIAG:  Joint stiffness of  spine  Pain in right ankle and joints of right foot  Muscle weakness (generalized)  Pain in right arm  PERTINENT HISTORY: See evaluation  PRECAUTIONS: N/A  SUBJECTIVE:    Pt. Reports a good trip to [place] for conference.  No new complaints.     PAIN:  Are you having pain? Yes: NPRS scale: 5/10 Pain location: Neck Pain description: aching/ stiff/ tight Aggravating factors: prolonged sitting/ activity Relieving factors: stretches/ PT   TODAY'S TREATMENT:   10/11/21:  Manual Therapy:    Supine cervical grade II-III distraction - 30 s hold x 5.  Supine L/R UT, levator, cervical rotn. Stretches 5x each.  Reassessment of cervical AROM in supine.  L/R shoulder AAROM to end-range flexion/ abduction.    Supine and prone STM to L and R upper trap, levator scapulae, posterior deltoid and mid-scapular region/ paraspinals.   Reviewed HEP  Trigger Point Dry Needling (TDN)   Education performed with patient regarding potential benefit of TDN. Reviewed precautions and risks with patient. Reviewed special precautions/risks over lung fields which include pneumothorax. Reviewed signs and symptoms of pneumothorax and advised pt to go to ER immediately if these symptoms develop advise them of dry needling treatment. Pt provided verbal consent to treatment. TDN performed to L and R UT/ mid-cervical paraspinals with 5, 0.25 x 40 L-type single needle placements on each side with 2 local twitch response (LTR). Pistoning technique utilized. Improved pain-free  motion following intervention.  Good tx. Tolerance.  Applied Biofreeze to upper back/ neck after tx.     PATIENT EDUCATION: Education details: HEP Person educated: Patient Education method: Explanation, Media planner, and Handouts Education comprehension: verbalized understanding and returned demonstration   HOME EXERCISE PROGRAM: [id number].  New HEP: [id number]             PT LONG TERM GOAL #6    Title Pt will improve worse cervical  pain by atleast 3 NPS points to allow increased tolerance during work and home related ADLs.     Baseline NPS: 8/10 current, 7/10 best, 10/10 Worse. 10/20: Worse pain 7/10     Time 8     Period Weeks     Status Partially Met     Target Date 10/30/2021           PT LONG TERM GOAL #7    Title Pt will improve bilat UE MMT by atleast 1/2 grade to facilitate greater functional abilities with ADLs related to caregiving.     Baseline UE MMT R/L:   Shoulder Flexion: 4/4-  Shoulder abduction: 4/4-  Shoulder ER: 4/4  Shoulder IR: 4/4  Elbow ext: 5/5   Eblow flex: 5/5 10/20: UE MMT R/L:   Shoulder Flexion: 4/4  Shoulder abduction: 4/4 Shoulder ER: 4/4  Shoulder IR: 4/4  Elbow ext: 5/5   Eblow flex: 5/5     Time 8     Period Weeks     Status Partially Met     Target Date 10/30/2021           PT LONG TERM GOAL #8    Title Pt will improve functional R hand strength equal to L to promote return of hand useage during grooming and feeding.     Baseline Grip R/L: 76.1 / 33.9    Lateral prehension grip R/L: 14 / 4 10/20: Grip R/L: 62.0 / 41.4    Lateral prehension grip R/L: 13 / 6.  1/12:  R 69#/ L 49.8#     Time 8     Period Weeks     Status Partially Met     Target Date 10/30/2021            Plan -     Clinical Impression Statement Tx. focus on manual stretches/ STM to cervical spine/ upper back.  Decrease noted in overall UT  trigger points and good tolerance to TDN/ STM.  Pain limited with cervical rotn. In seated posture with hypomobility noted.  Pt. understands current HEP and importance of being aware of proper posture/ body mechanics with work-related tasks and household activities. Pt. Will focus on current UE ex./ cervical stretches to increase pain-free mobility.  Pt. Instructed to contact PT if any questions or concerns.     Personal Factors and Comorbidities Comorbidity 2;Profession;Past/Current Experience    Comorbidities Hypertension, Obesity    Examination-Activity Limitations  Squat;Locomotion Level    Examination-Participation Restrictions Occupation;Cleaning;Laundry    Stability/Clinical Decision Making Evolving/Moderate complexity    Clinical Decision Making Moderate    Rehab Potential Good    PT Frequency 1x/week   PT Duration 8 weeks    PT Treatment/Interventions ADLs/Self Care Home Management;Aquatic Therapy;Biofeedback;Cryotherapy;Electrical Stimulation;Moist Heat;Ultrasound;Functional mobility training;Therapeutic activities;Therapeutic exercise;Balance training;Neuromuscular re-education;Patient/family education;Manual techniques;Scar mobilization;Passive range of motion;Dry needling;Energy conservation;Taping;Gait training;Stair training;DME Instruction;Spinal Manipulations;Joint Manipulations, Canalith repositioning procedure.    PT Next Visit Plan Progress strengthening exercise.     PT Home Exercise Plan [id number].   HEP: [id number]    Consulted  and Agree with Plan of Care Patient         Pura Spice, PT, DPT # [phone] 10/17/2021, 12:21 PM

## 2021-10-25 ENCOUNTER — Ambulatory Visit: Payer: BC Managed Care – PPO | Admitting: Physical Therapy

## 2021-10-25 DIAGNOSIS — M79601 Pain in right arm: Secondary | ICD-10-CM

## 2021-10-25 DIAGNOSIS — M25571 Pain in right ankle and joints of right foot: Secondary | ICD-10-CM | POA: Diagnosis not present

## 2021-10-25 DIAGNOSIS — M2560 Stiffness of unspecified joint, not elsewhere classified: Secondary | ICD-10-CM

## 2021-10-25 DIAGNOSIS — M6281 Muscle weakness (generalized): Secondary | ICD-10-CM

## 2021-10-26 NOTE — Therapy (Signed)
OUTPATIENT PHYSICAL THERAPY TREATMENT NOTE   Patient Name: Amanda Humphrey MRN: [id number] DOB:01/03/1966, 56 y.o., female Today's Date: 10/25/2021  PCP: Tullo, Teresa L, MD REFERRING PROVIDER: Tullo, Teresa L, MD     PT End of Session -     Visit Number 57   Number of Visits 76   Date for PT Re-Evaluation 10/30/21    Authorization - Visit Number 7   Authorization - Number of Visits 10  [phone] to [phone]  (43 min.)   Activity Tolerance Patient tolerated treatment well;Patient limited by pain    Behavior During Therapy [facility] for tasks assessed/performed          Past Medical History:  Diagnosis Date   Allergy    Chronic kidney disease    stones   Fibrocystic breast disease 2013   Meniere disease    Migraines    Past Surgical History:  Procedure Laterality Date   ABDOMINAL HYSTERECTOMY  2011   laprascopic supracervical, [place]   APPENDECTOMY  1999   BREAST BIOPSY Right Nov 2012    fibrocystic disease, [place]   CESAREAN SECTION     4 total   CHOLECYSTECTOMY  2011   TONSILLECTOMY AND ADENOIDECTOMY  1975   Patient Active Problem List   Diagnosis Date Noted   H/O of hemilaminectomy 02/15/2019   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   Fatigue 09/23/2017   History of pneumonia 04/30/2016   Grief reaction 04/30/2016   Vitamin D deficiency 02/22/2015   Hyperlipidemia 01/01/2015   Visit for preventive health examination 12/04/2013   Lower extremity edema 09/02/2013   Essential hypertension 09/02/2013   History of cardiomyopathy 09/02/2013   SVT (supraventricular tachycardia) ([facility]) 09/02/2013   Palpitations 09/02/2013   Contrast media allergy 03/10/2013   Fibrocystic breast disease    Morbid obesity ([facility]) 02/05/2012   Meniere disease     REFERRING DIAG:  M79.18 (ICD-10-CM) - Myalgia, other site  Z98.890 (ICD-10-CM) - Other specified postprocedural states  M54.12 (ICD-10-CM) - Radiculopathy, cervical region    THERAPY DIAG:  Joint stiffness of  spine  Pain in right ankle and joints of right foot  Muscle weakness (generalized)  Pain in right arm  PERTINENT HISTORY: See evaluation  PRECAUTIONS: N/A  SUBJECTIVE:  (10/25/21)  Pt. Reports being sick with Covid a couple days after last PT appt.  Pt. States she was sick for over a week and returned to work yesterday.  Pt. Reports no new issues.      PAIN:  Are you having pain? Yes: NPRS scale: 5/10 Pain location: Neck Pain description: aching/ stiff/ tight Aggravating factors: prolonged sitting/ activity Relieving factors: stretches/ PT   TODAY'S TREATMENT:   10/25/21:  There.ex.:  Standing GTB shoulder ex.:  horizontal abduction/ diagonals/ ER 20x each.  Mirror feedback.    Reassessment of cervical/ shoulder AROM.   Manual Therapy:    Supine L/R UT, levator, cervical rotn. Stretches 5x each. Supine cervical grade II-III distraction - 30 s hold x 5.    L/R shoulder AAROM to end-range flexion/ abduction.    Supine and prone STM to L and R upper trap, levator scapulae, posterior deltoid and mid-scapular region/ paraspinals.   Reviewed HEP  Trigger Point Dry Needling (TDN)   Education performed with patient regarding potential benefit of TDN. Reviewed precautions and risks with patient. Reviewed special precautions/risks over lung fields which include pneumothorax. Reviewed signs and symptoms of pneumothorax and advised pt to go to ER immediately if these symptoms develop advise them   of dry needling treatment. Pt provided verbal consent to treatment. TDN performed to L UT/ mid-cervical paraspinals with 4, 0.25 x 40 L-type single needle placements on each side with 2 local twitch response (LTR). Pistoning technique utilized. Improved pain-free motion following intervention.  Good tx. Tolerance.  Applied Biofreeze to upper back/ neck after tx.     PATIENT EDUCATION: Education details: HEP Person educated: Patient Education method: Explanation, Demonstration, and  Handouts Education comprehension: verbalized understanding and returned demonstration   HOME EXERCISE PROGRAM: [id number].  New HEP: [id number]             PT LONG TERM GOAL #6    Title Pt will improve worse cervical pain by atleast 3 NPS points to allow increased tolerance during work and home related ADLs.     Baseline NPS: 8/10 current, 7/10 best, 10/10 Worse. 10/20: Worse pain 7/10     Time 8     Period Weeks     Status Partially Met     Target Date 10/30/2021           PT LONG TERM GOAL #7    Title Pt will improve bilat UE MMT by atleast 1/2 grade to facilitate greater functional abilities with ADLs related to caregiving.     Baseline UE MMT R/L:   Shoulder Flexion: 4/4-  Shoulder abduction: 4/4-  Shoulder ER: 4/4  Shoulder IR: 4/4  Elbow ext: 5/5   Eblow flex: 5/5 10/20: UE MMT R/L:   Shoulder Flexion: 4/4  Shoulder abduction: 4/4 Shoulder ER: 4/4  Shoulder IR: 4/4  Elbow ext: 5/5   Eblow flex: 5/5     Time 8     Period Weeks     Status Partially Met     Target Date 10/30/2021           PT LONG TERM GOAL #8    Title Pt will improve functional R hand strength equal to L to promote return of hand useage during grooming and feeding.     Baseline Grip R/L: 76.1 / 33.9    Lateral prehension grip R/L: 14 / 4 10/20: Grip R/L: 62.0 / 41.4    Lateral prehension grip R/L: 13 / 6.  1/12:  R 69#/ L 49.8#     Time 8     Period Weeks     Status Partially Met     Target Date 10/30/2021            Plan -     Clinical Impression Statement Tx. focus on manual stretches/ STM to cervical spine/ upper back.  Decrease noted in overall UT  trigger points and good tolerance to TDN/ STM.  Pain limited with cervical rotn. In seated posture with hypomobility noted.  Pt. understands current HEP and importance of being aware of proper posture/ body mechanics with work-related tasks and household activities. Pt. Will focus on current UE ex./ cervical stretches to increase pain-free mobility.  Pt. Instructed  to contact PT if any questions or concerns.     Personal Factors and Comorbidities Comorbidity 2;Profession;Past/Current Experience    Comorbidities Hypertension, Obesity    Examination-Activity Limitations Squat;Locomotion Level    Examination-Participation Restrictions Occupation;Cleaning;Laundry    Stability/Clinical Decision Making Evolving/Moderate complexity    Clinical Decision Making Moderate    Rehab Potential Good    PT Frequency 1x/week   PT Duration 8 weeks    PT Treatment/Interventions ADLs/Self Care Home Management;Aquatic Therapy;Biofeedback;Cryotherapy;Electrical Stimulation;Moist Heat;Ultrasound;Functional mobility training;Therapeutic activities;Therapeutic exercise;Balance training;Neuromuscular re-education;Patient/family education;Manual techniques;Scar mobilization;Passive   range of motion;Dry needling;Energy conservation;Taping;Gait training;Stair training;DME Instruction;Spinal Manipulations;Joint Manipulations, Canalith repositioning procedure.    PT Next Visit Plan Progress strengthening exercise.     PT Home Exercise Plan [id number].   HEP: [id number]    Consulted and Agree with Plan of Care Patient         Michael C Sherk, PT, DPT # [id number] 10/26/2021, 7:44 PM     

## 2021-11-07 ENCOUNTER — Ambulatory Visit: Payer: BC Managed Care – PPO | Attending: Internal Medicine | Admitting: Physical Therapy

## 2021-11-07 DIAGNOSIS — S93409A Sprain of unspecified ligament of unspecified ankle, initial encounter: Secondary | ICD-10-CM | POA: Diagnosis not present

## 2021-11-07 DIAGNOSIS — M2560 Stiffness of unspecified joint, not elsewhere classified: Secondary | ICD-10-CM | POA: Diagnosis not present

## 2021-11-07 DIAGNOSIS — M6281 Muscle weakness (generalized): Secondary | ICD-10-CM | POA: Diagnosis not present

## 2021-11-07 DIAGNOSIS — S96919A Strain of unspecified muscle and tendon at ankle and foot level, unspecified foot, initial encounter: Secondary | ICD-10-CM | POA: Diagnosis not present

## 2021-11-07 DIAGNOSIS — M79601 Pain in right arm: Secondary | ICD-10-CM | POA: Insufficient documentation

## 2021-11-07 DIAGNOSIS — M25571 Pain in right ankle and joints of right foot: Secondary | ICD-10-CM | POA: Insufficient documentation

## 2021-11-17 NOTE — Therapy (Signed)
OUTPATIENT PHYSICAL THERAPY TREATMENT NOTE   Patient Name: Amanda Humphrey MRN: [id number] DOB:Oct 23, 1965, 56 y.o., female Today's Date: 11/07/2021  PCP: Crecencio Mc, MD REFERRING PROVIDER: Crecencio Mc, MD     PT End of Session -     Visit Number 52   Number of Visits 4   Date for PT Re-Evaluation 01/02/22    Authorization - Visit Number 8   Authorization - Number of Visits 10  [phone] to 0818  (48 min.)   Activity Tolerance Patient tolerated treatment well;Patient limited by pain    Behavior During Therapy [facility] for tasks assessed/performed          Past Medical History:  Diagnosis Date   Allergy    Chronic kidney disease    stones   Fibrocystic breast disease 2013   Meniere disease    Migraines    Past Surgical History:  Procedure Laterality Date   ABDOMINAL HYSTERECTOMY  2011   laprascopic supracervical, [place]   APPENDECTOMY  1999   BREAST BIOPSY Right Nov 2012    fibrocystic disease, [place]   CESAREAN SECTION     4 total   CHOLECYSTECTOMY  2011   [place]   Patient Active Problem List   Diagnosis Date Noted   H/O of hemilaminectomy 02/15/2019   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[place]) 11/17/2018   Fatigue 09/23/2017   History of pneumonia 04/30/2016   Grief reaction 04/30/2016   Vitamin D deficiency 02/22/2015   Hyperlipidemia 01/01/2015   Visit for preventive health examination 12/04/2013   Lower extremity edema 09/02/2013   Essential hypertension 09/02/2013   History of cardiomyopathy 09/02/2013   SVT (supraventricular tachycardia) 09/02/2013   Palpitations 09/02/2013   Contrast media allergy 03/10/2013   Fibrocystic breast disease    Morbid obesity ([place]) 02/05/2012   Meniere disease     REFERRING DIAG:  M79.18 (ICD-10-CM) - Myalgia, other site  Z98.890 (ICD-10-CM) - Other specified postprocedural states  M54.12 (ICD-10-CM) - Radiculopathy, cervical region    THERAPY DIAG:  Joint stiffness of  spine  Pain in right ankle and joints of right foot  Muscle weakness (generalized)  Pain in right arm  Sprain and strain of ankle  PERTINENT HISTORY: See evaluation  PRECAUTIONS: N/A  SUBJECTIVE:  (11/07/21)  Pt.    PAIN:  Are you having pain? Yes: NPRS scale: 5/10 Pain location: Neck Pain description: aching/ stiff/ tight Aggravating factors: prolonged sitting/ activity Relieving factors: stretches/ PT   TODAY'S TREATMENT:   11/07/21:  There.ex.:   Reassessment of cervical/ shoulder AROM.   Manual Therapy:    Supine L/R UT, levator, cervical rotn. Stretches 5x each. Supine cervical grade II-III distraction - 30 s hold x 5.    L/R shoulder AAROM to end-range flexion/ abduction.    Supine and prone STM to L and R upper trap, levator scapulae, posterior deltoid and mid-scapular region/ paraspinals.   Reviewed HEP  Trigger Point Dry Needling (TDN)   Education performed with patient regarding potential benefit of TDN. Reviewed precautions and risks with patient. Reviewed special precautions/risks over lung fields which include pneumothorax. Reviewed signs and symptoms of pneumothorax and advised pt to go to ER immediately if these symptoms develop advise them of dry needling treatment. Pt provided verbal consent to treatment. TDN performed to L UT/ mid-cervical paraspinals with 4, 0.25 x 40 L-type single needle placements on each side with 2 local twitch response (LTR). Pistoning technique utilized. Improved pain-free motion following intervention.  Good tx.  Tolerance. Applied Biofreeze to upper back/ neck after tx.     PATIENT EDUCATION: Education details: HEP Person educated: Patient Education method: Explanation, Media planner, and Handouts Education comprehension: verbalized understanding and returned demonstration   HOME EXERCISE PROGRAM: [id number].  New HEP: [id number]             PT LONG TERM GOAL #6    Title Pt will improve worse cervical pain by at least  3 NPS points to allow increased tolerance during work and home related ADLs.     Baseline NPS: 8/10 current, 7/10 best, 10/10 Worse. 10/20: Worse pain 7/10.  10/12: 5/10 neck pain at worst (improving)- stress related    Time 8     Period Weeks     Status Partially Met     Target Date 01/02/2022           PT LONG TERM GOAL #7    Title Pt will improve bilat UE MMT by atleast 1/2 grade to facilitate greater functional abilities with ADLs related to caregiving.     Baseline UE MMT R/L:   Shoulder Flexion: 4/4-  Shoulder abduction: 4/4-  Shoulder ER: 4/4  Shoulder IR: 4/4  Elbow ext: 5/5   Eblow flex: 5/5 10/20: UE MMT R/L:   Shoulder Flexion: 4/4  Shoulder abduction: 4/4 Shoulder ER: 4/4  Shoulder IR: 4/4  Elbow ext: 5/5   Elbow flex: 5/5.  10/12: B shoulder flexion 4/5 MMT, abduction 4/5 MMT biceps/triceps 5/5 MMT.     Time 8     Period Weeks     Status Partially Met     Target Date 01/02/2022           PT LONG TERM GOAL #8    Title Pt will improve functional R hand strength equal to L to promote return of hand useage during grooming and feeding.     Baseline Grip R/L: 76.1 / 33.9    Lateral prehension grip R/L: 14 / 4 10/20: Grip R/L: 62.0 / 41.4    Lateral prehension grip R/L: 13 / 6.  1/12:  R 69#/ L 49.8#     Time 8     Period Weeks     Status Partially Met     Target Date 01/02/2022            Plan -     Clinical Impression Statement Tx. focus on manual stretches/ STM to cervical spine/ upper back and HEP to improve to focus on UE strength training.  Decrease noted in overall UT  trigger points and good tolerance to TDN/ STM.  Pain limited with cervical rotn. In seated posture with hypomobility noted.  Pt. understands current HEP and importance of being aware of proper posture/ body mechanics with work-related tasks and household activities. Pt. Will focus on current UE ex./ cervical stretches to increase pain-free mobility.  Pt. Instructed to contact PT if any questions or concerns.      Personal Factors and Comorbidities Comorbidity 2;Profession;Past/Current Experience    Comorbidities Hypertension, Obesity    Examination-Activity Limitations Squat;Locomotion Level    Examination-Participation Restrictions Occupation;Cleaning;Laundry    Stability/Clinical Decision Making Evolving/Moderate complexity    Clinical Decision Making Moderate    Rehab Potential Good    PT Frequency 1-2x/biweekly   PT Duration 8 weeks    PT Treatment/Interventions ADLs/Self Care Home Management;Aquatic Therapy;Biofeedback;Cryotherapy;Electrical Stimulation;Moist Heat;Ultrasound;Functional mobility training;Therapeutic activities;Therapeutic exercise;Balance training;Neuromuscular re-education;Patient/family education;Manual techniques;Scar mobilization;Passive range of motion;Dry needling;Energy conservation;Taping;Gait training;Stair training;DME Instruction;Spinal Manipulations;Joint Manipulations, Canalith repositioning procedure.  PT Next Visit Plan Progress strengthening exercise.     PT Home Exercise Plan [id number].   HEP: [id number]    Consulted and Agree with Plan of Care Patient         Pura Spice, PT, DPT # [phone] 11/17/2021, 3:16 PM

## 2021-11-20 ENCOUNTER — Encounter: Payer: BC Managed Care – PPO | Admitting: Physical Therapy

## 2021-11-21 ENCOUNTER — Ambulatory Visit: Payer: BC Managed Care – PPO | Admitting: Physical Therapy

## 2021-11-21 DIAGNOSIS — M25571 Pain in right ankle and joints of right foot: Secondary | ICD-10-CM

## 2021-11-21 DIAGNOSIS — M79601 Pain in right arm: Secondary | ICD-10-CM

## 2021-11-21 DIAGNOSIS — M6281 Muscle weakness (generalized): Secondary | ICD-10-CM | POA: Diagnosis not present

## 2021-11-21 DIAGNOSIS — S93409A Sprain of unspecified ligament of unspecified ankle, initial encounter: Secondary | ICD-10-CM | POA: Diagnosis not present

## 2021-11-21 DIAGNOSIS — M2560 Stiffness of unspecified joint, not elsewhere classified: Secondary | ICD-10-CM

## 2021-11-21 DIAGNOSIS — S96919A Strain of unspecified muscle and tendon at ankle and foot level, unspecified foot, initial encounter: Secondary | ICD-10-CM | POA: Diagnosis not present

## 2021-11-25 NOTE — Progress Notes (Unsigned)
Cardiology Office Note  Date:  11/26/2021   ID:  Amanda Humphrey, DOB 26-Nov-1965, MRN [id number]  PCP:  Sherlene Shams, MD   Chief Complaint  Patient presents with   12 month follow up     Patient c/o palpitations and SVT more often since she had COVID in September 2023.. Medications reviewed by the patient verbally.     HPI:  Amanda Humphrey is a very pleasant 56 year-old woman with remote history of cardiomyopathy approximately 20 years ago after having her third child with mild depression of her ejection fraction which returned back to normal 6 months later,  periodic SVT, palpitations,  lower extremity swelling  who presents for routine follow-up of her arrhythmia, hypertension, fluid retention following neck surgery January 2021, hyperlipidemia  Last seen by myself in clinic July 2022 COVID-positive September 2023 Treated with Paxlovid, prednisone Missed 10 days of work Appreciated more tachycardia as she was ill, now better, back to her baseline  Denies significant lower extremity edema Unable to tolerate statins  Busy at work Also significant responsibilities taking care of Amanda Humphrey her aging husband who has significant medical issues including severe chronic  low back pain He is essentially housebound  Blood pressure well controlled on carvedilol, HCTZ Occasionally takes Lasix  Lab work stable Total cholesterol 250, on off crestor 5 mg  EKG personally reviewed by myself on todays visit Normal sinus rhythm with rate 84 bpm right bundle branch block  [facility] records from neurosurgery reviewed status post right C7-T1 discectomy and foraminotomy/hemilaminectomy February 08, 2019 Performed at [facility] procedure orthostatic hypotension, treated with fluid boluses Taking Valium, gabapentin for muscle spasms Recent office note from neurosurgery indicates she is on Lasix Working with physical therapy, initially using a walker for stability Residual numbness right hand, Improvement in  grip and dexterity C8 nerve distribution And ability to tolerate narcotics  post decompression nerve root edema accentuated by postop fluid boluses for postop postural hypotension and apparent allergic reaction to Dilaudid and some rebound swelling upon completion of Decadron taper.  Prior history of cardiomyopathy many years ago that seemed to significantly improve with aggressive diuresis. Approximately 18 years ago, Lasix was provided with 16 pound weight loss and improvement of her symptoms. On her fourth child she had fluid retention but no recurrent cardiomyopathy   prior echocardiogram 12/16/2010 showing normal ejection fraction rate and 55%, mild MR    PMH:   has a past medical history of Allergy, Chronic kidney disease, Fibrocystic breast disease (2013), Meniere disease, and Migraines.  PSH:    Past Surgical History:  Procedure Laterality Date   ABDOMINAL HYSTERECTOMY  2011   laprascopic supracervical, [place]   APPENDECTOMY  1999   BREAST BIOPSY Right Nov 2012    fibrocystic disease, [place]   CESAREAN SECTION     4 total   CHOLECYSTECTOMY  2011   TONSILLECTOMY AND ADENOIDECTOMY  1975    Current Outpatient Medications  Medication Sig Dispense Refill   amphetamine-dextroamphetamine (ADDERALL) 20 MG tablet Take 20 mg by mouth every evening.      carvedilol (COREG) 6.25 MG tablet TAKE 1 TABLET TWICE A DAY 60 tablet 0   cyclobenzaprine (FLEXERIL) 10 MG tablet Take 10 mg by mouth at bedtime.     ergocalciferol (DRISDOL) 1.25 MG (50000 UT) capsule Take 1 capsule (50,000 Units total) by mouth once a week. 12 capsule 4   ezetimibe (ZETIA) 10 MG tablet Take 1 tablet (10 mg total) by mouth daily. 90 tablet 3  furosemide (LASIX) 20 MG tablet Take 1 tablet (20 mg total) by mouth daily as needed. CALL AND SCHEDULE OVERDUE FOLLOW UP VISIT TO RECEIVE FURTHER REFILLS. 1ST ATTEMPT. 30 tablet 0   hydrochlorothiazide (HYDRODIURIL) 25 MG tablet TAKE 1 TABLET DAILY 30 tablet 0   ipratropium  (ATROVENT) 0.06 % nasal spray Place 2 sprays into both nostrils 4 (four) times daily. 15 mL 0   methocarbamol (ROBAXIN) 750 MG tablet Take 750 mg by mouth in the morning and at bedtime.     omeprazole (PRILOSEC) 40 MG capsule TAKE 1 CAPSULE DAILY 90 capsule 3   ondansetron (ZOFRAN ODT) 4 MG disintegrating tablet Take 1 tablet (4 mg total) by mouth every 8 (eight) hours as needed for nausea or vomiting. 20 tablet 0   rosuvastatin (CRESTOR) 5 MG tablet Take 1 tablet (5 mg total) by mouth daily. 90 tablet 3   cyclobenzaprine (FLEXERIL) 5 MG tablet Take 5 mg by mouth at bedtime. (Patient not taking: Reported on 11/26/2021)     methocarbamol (ROBAXIN) 500 MG tablet TAKE 1 TABLET TWICE A DAY (Patient not taking: Reported on 11/26/2021) 90 tablet 7   nirmatrelvir/ritonavir EUA (PAXLOVID) 20 x 150 MG & 10 x 100MG  TABS Take nirmatrelvir (150 mg) two tablets twice daily for 5 days and ritonavir (100 mg) one tablet twice daily for 5 days. GFR > 60. (Patient not taking: Reported on 11/26/2021) 30 tablet 0   ondansetron (ZOFRAN-ODT) 8 MG disintegrating tablet Take 1 tablet (8 mg total) by mouth every 8 (eight) hours as needed for nausea or vomiting. (Patient not taking: Reported on 11/26/2021) 20 tablet 0   predniSONE (STERAPRED UNI-PAK 21 TAB) 10 MG (21) TBPK tablet Take following package directions (Patient not taking: Reported on 11/26/2021) 21 tablet 0   No current facility-administered medications for this visit.    Allergies:   Bee venom, Codeine, Hydrocodone, Tramadol, and Oxycodone   Social History:  The patient  reports that she has never smoked. She has never used smokeless tobacco. She reports current alcohol use. She reports that she does not use drugs.   Family History:   family history includes Alzheimer's disease in her maternal grandmother and paternal grandmother; Arthritis in her mother; Breast cancer in her maternal grandmother and paternal grandmother; Cancer in her mother; Diabetes in her  father; Heart disease in her maternal grandmother; Hyperlipidemia in her father; Hypertension in her father and mother; Stroke in her father.    Review of Systems: Review of Systems  Constitutional: Negative.   HENT: Negative.    Respiratory: Negative.    Cardiovascular: Negative.   Gastrointestinal: Negative.   Musculoskeletal: Negative.   Neurological: Negative.   Psychiatric/Behavioral: Negative.    All other systems reviewed and are negative.   PHYSICAL EXAM: VS:  BP 130/84 (BP Location: Left Arm, Patient Position: Sitting, Cuff Size: Large)   Pulse 84   Ht 5\' 9"  (1.753 m)   Wt 248 lb 4 oz (112.6 kg)   SpO2 98%   BMI 36.66 kg/m  , BMI Body mass index is 36.66 kg/m. Constitutional:  oriented to person, place, and time. No distress.  HENT:  Head: Grossly normal Eyes:  no discharge. No scleral icterus.  Neck: No JVD, no carotid bruits  Cardiovascular: Regular rate and rhythm, no murmurs appreciated Pulmonary/Chest: Clear to auscultation bilaterally, no wheezes or rails Abdominal: Soft.  no distension.  no tenderness.  Musculoskeletal: Normal range of motion Neurological:  normal muscle tone. Coordination normal. No atrophy Skin: Skin warm  and dry Psychiatric: normal affect, pleasant   Recent Labs: 03/20/2021: ALT 21; BUN 16; Creatinine, Ser 0.92; Hemoglobin 13.5; Platelets 321.0; Potassium 4.5; Sodium 138; TSH 0.95    Lipid Panel Lab Results  Component Value Date   CHOL 250 (H) 03/20/2021   HDL 57.90 03/20/2021   LDLCALC 168 (H) 03/20/2021   TRIG 122.0 03/20/2021      Wt Readings from Last 3 Encounters:  11/26/21 248 lb 4 oz (112.6 kg)  10/13/21 242 lb (109.8 kg)  07/14/21 230 lb (104.3 kg)     ASSESSMENT AND PLAN:  SVT (supraventricular tachycardia) (HCC) Relatively well controlled on beta-blocker/carvedilol, no medication changes made  Essential hypertension Blood pressure is well controlled on today's visit. No changes made to the  medications.  Mixed hyperlipidemia Not on Crestor secondary to statin myalgia  Given coronary calcium, recommend we start Repatha 140 mg subcu every 2 weeks.  Once on Repatha, may not need Zetia  Lower extremity edema Symptoms are stable on HCTZ  Rarely uses Lasix    Total encounter time more than 30 minutes  Greater than 50% was spent in counseling and coordination of care with the patient    Orders Placed This Encounter  Procedures   EKG 12-Lead     Signed, Dossie Arbour, M.D., Ph.D. 11/26/2021  [facility] [place], [place] [phone]

## 2021-11-26 ENCOUNTER — Ambulatory Visit: Payer: BC Managed Care – PPO | Attending: Cardiovascular Disease | Admitting: Cardiovascular Disease

## 2021-11-26 ENCOUNTER — Encounter: Payer: Self-pay | Admitting: Cardiovascular Disease

## 2021-11-26 VITALS — BP 130/84 | HR 84 | Ht 69.0 in | Wt 248.2 lb

## 2021-11-26 DIAGNOSIS — I471 Supraventricular tachycardia, unspecified: Secondary | ICD-10-CM | POA: Diagnosis not present

## 2021-11-26 DIAGNOSIS — Z8679 Personal history of other diseases of the circulatory system: Secondary | ICD-10-CM

## 2021-11-26 DIAGNOSIS — E782 Mixed hyperlipidemia: Secondary | ICD-10-CM

## 2021-11-26 DIAGNOSIS — I2584 Coronary atherosclerosis due to calcified coronary lesion: Secondary | ICD-10-CM

## 2021-11-26 DIAGNOSIS — R002 Palpitations: Secondary | ICD-10-CM

## 2021-11-26 DIAGNOSIS — T466X5A Adverse effect of antihyperlipidemic and antiarteriosclerotic drugs, initial encounter: Secondary | ICD-10-CM

## 2021-11-26 DIAGNOSIS — M791 Myalgia, unspecified site: Secondary | ICD-10-CM

## 2021-11-26 DIAGNOSIS — I10 Essential (primary) hypertension: Secondary | ICD-10-CM | POA: Diagnosis not present

## 2021-11-26 DIAGNOSIS — I2510 Atherosclerotic heart disease of native coronary artery without angina pectoris: Secondary | ICD-10-CM

## 2021-11-26 MED ORDER — HYDROCHLOROTHIAZIDE 25 MG PO TABS: 25.0000 mg | ORAL_TABLET | Freq: Every day | ORAL | 3 refills | Status: DC

## 2021-11-26 MED ORDER — FUROSEMIDE 20 MG PO TABS: 20.0000 mg | ORAL_TABLET | Freq: Every day | ORAL | 3 refills | Status: DC | PRN

## 2021-11-26 MED ORDER — CARVEDILOL 6.25 MG PO TABS: 6.2500 mg | ORAL_TABLET | Freq: Two times a day (BID) | ORAL | 3 refills | Status: DC

## 2021-11-26 MED ORDER — REPATHA 140 MG/ML ~~LOC~~ SOSY: 1.0000 | PREFILLED_SYRINGE | SUBCUTANEOUS | 6 refills | Status: DC

## 2021-11-26 NOTE — Patient Instructions (Signed)
Research repatha/praluent for cholesterol  Medication Instructions:  No changes  If you need a refill on your cardiac medications before your next appointment, please call your pharmacy.   Lab work: No new labs needed  Testing/Procedures: No new testing needed  Follow-Up: At [facility], you and your health needs are our priority.  As part of our continuing mission to provide you with exceptional heart care, we have created designated Provider Care Teams.  These Care Teams include your primary Cardiologist (physician) and Advanced Practice Providers (APPs -  Physician Assistants and Nurse Practitioners) who all work together to provide you with the care you need, when you need it.  You will need a follow up appointment in 12 months  Providers on your designated Care Team:   Murray Hodgkins, NP Christell Faith, PA-C Cadence Kathlen Mody, [place]  COVID-19 Vaccine Information can be found at: [email or web address] For questions related to vaccine distribution or appointments, please email [email or web address] or call [phone].

## 2021-11-26 NOTE — Therapy (Signed)
OUTPATIENT PHYSICAL THERAPY TREATMENT NOTE   Patient Name: Amanda Humphrey MRN: [id number] DOB:Jul 18, 1965, 56 y.o., female Today's Date: 11/21/2021  PCP: Crecencio Mc, MD REFERRING PROVIDER: Crecencio Mc, MD     PT End of Session -     Visit Number 59   Number of Visits 66   Date for PT Re-Evaluation 01/02/22    Authorization - Visit Number 9   Authorization - Number of Visits 10  [id number] to 1647  (45 min.)   Activity Tolerance Patient tolerated treatment well;Patient limited by pain    Behavior During Therapy [facility] for tasks assessed/performed          Past Medical History:  Diagnosis Date   Allergy    Chronic kidney disease    stones   Fibrocystic breast disease 2013   Meniere disease    Migraines    Past Surgical History:  Procedure Laterality Date   ABDOMINAL HYSTERECTOMY  2011   laprascopic supracervical, [place]   APPENDECTOMY  1999   BREAST BIOPSY Right Nov 2012    fibrocystic disease, [place]   CESAREAN SECTION     4 total   CHOLECYSTECTOMY  2011   [place]   Patient Active Problem List   Diagnosis Date Noted   H/O of hemilaminectomy 02/15/2019   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[place]) 11/17/2018   Fatigue 09/23/2017   History of pneumonia 04/30/2016   Grief reaction 04/30/2016   Vitamin D deficiency 02/22/2015   Hyperlipidemia 01/01/2015   Visit for preventive health examination 12/04/2013   Lower extremity edema 09/02/2013   Essential hypertension 09/02/2013   History of cardiomyopathy 09/02/2013   SVT (supraventricular tachycardia) 09/02/2013   Palpitations 09/02/2013   Contrast media allergy 03/10/2013   Fibrocystic breast disease    Morbid obesity ([place]) 02/05/2012   Meniere disease     REFERRING DIAG:  M79.18 (ICD-10-CM) - Myalgia, other site  Z98.890 (ICD-10-CM) - Other specified postprocedural states  M54.12 (ICD-10-CM) - Radiculopathy, cervical region    THERAPY DIAG:  Joint stiffness of  spine  Pain in right ankle and joints of right foot  Muscle weakness (generalized)  Pain in right arm  PERTINENT HISTORY: See evaluation  PRECAUTIONS: N/A  SUBJECTIVE:  (11/21/21)  Pt. Remains busy with work.  Pt. C/o tightness/ stiffness in neck/ upper back.      PAIN:  Are you having pain? Yes: NPRS scale: 5/10 Pain location: Neck Pain description: aching/ stiff/ tight Aggravating factors: prolonged sitting/ activity Relieving factors: stretches/ PT   TODAY'S TREATMENT:   11/21/21:  There.ex.:   Reassessment of cervical/ shoulder AROM.   Manual Therapy:    Supine L/R UT, levator, cervical rotn. Stretches 5x each. Supine cervical grade II-III distraction - 30 s hold x 5.    L/R shoulder AAROM to end-range flexion/ abduction.    Supine and prone STM to L and R upper trap, levator scapulae, posterior deltoid and mid-scapular region/ paraspinals.   Reviewed HEP  Trigger Point Dry Needling (TDN)   Education performed with patient regarding potential benefit of TDN. Reviewed precautions and risks with patient. Reviewed special precautions/risks over lung fields which include pneumothorax. Reviewed signs and symptoms of pneumothorax and advised pt to go to ER immediately if these symptoms develop advise them of dry needling treatment. Pt provided verbal consent to treatment. TDN performed to L UT/ mid-cervical paraspinals with 4, 0.25 x 40 L-type single needle placements on each side with 2 local twitch response (LTR). Pistoning technique  utilized. Improved pain-free motion following intervention.  Good tx. Tolerance. Applied Biofreeze to upper back/ neck after tx.     PATIENT EDUCATION: Education details: HEP Person educated: Patient Education method: Explanation, Media planner, and Handouts Education comprehension: verbalized understanding and returned demonstration   HOME EXERCISE PROGRAM: [id number].  New HEP: [id number]             PT LONG TERM GOAL #6    Title  Pt will improve worse cervical pain by at least 3 NPS points to allow increased tolerance during work and home related ADLs.     Baseline NPS: 8/10 current, 7/10 best, 10/10 Worse. 10/20: Worse pain 7/10.  10/12: 5/10 neck pain at worst (improving)- stress related    Time 8     Period Weeks     Status Partially Met     Target Date 01/02/2022           PT LONG TERM GOAL #7    Title Pt will improve bilat UE MMT by atleast 1/2 grade to facilitate greater functional abilities with ADLs related to caregiving.     Baseline UE MMT R/L:   Shoulder Flexion: 4/4-  Shoulder abduction: 4/4-  Shoulder ER: 4/4  Shoulder IR: 4/4  Elbow ext: 5/5   Eblow flex: 5/5 10/20: UE MMT R/L:   Shoulder Flexion: 4/4  Shoulder abduction: 4/4 Shoulder ER: 4/4  Shoulder IR: 4/4  Elbow ext: 5/5   Elbow flex: 5/5.  10/12: B shoulder flexion 4/5 MMT, abduction 4/5 MMT biceps/triceps 5/5 MMT.     Time 8     Period Weeks     Status Partially Met     Target Date 01/02/2022           PT LONG TERM GOAL #8    Title Pt will improve functional R hand strength equal to L to promote return of hand useage during grooming and feeding.     Baseline Grip R/L: 76.1 / 33.9    Lateral prehension grip R/L: 14 / 4 10/20: Grip R/L: 62.0 / 41.4    Lateral prehension grip R/L: 13 / 6.  1/12:  R 69#/ L 49.8#     Time 8     Period Weeks     Status Partially Met     Target Date 01/02/2022            Plan -     Clinical Impression Statement Tx. focus on manual stretches/ STM to cervical spine/ upper back and HEP to improve to focus on UE strength training.  Decrease noted in overall UT  trigger points and good tolerance to TDN/ STM.  Pain limited with cervical rotn. In seated posture with hypomobility noted.  Pt. understands current HEP and importance of being aware of proper posture/ body mechanics with work-related tasks and household activities. Pt. Will focus on current UE ex./ cervical stretches to increase pain-free mobility.  Pt. Instructed  to contact PT if any questions or concerns.     Personal Factors and Comorbidities Comorbidity 2;Profession;Past/Current Experience    Comorbidities Hypertension, Obesity    Examination-Activity Limitations Squat;Locomotion Level    Examination-Participation Restrictions Occupation;Cleaning;Laundry    Stability/Clinical Decision Making Evolving/Moderate complexity    Clinical Decision Making Moderate    Rehab Potential Good    PT Frequency 1-2x/biweekly   PT Duration 8 weeks    PT Treatment/Interventions ADLs/Self Care Home Management;Aquatic Therapy;Biofeedback;Cryotherapy;Electrical Stimulation;Moist Heat;Ultrasound;Functional mobility training;Therapeutic activities;Therapeutic exercise;Balance training;Neuromuscular re-education;Patient/family education;Manual techniques;Scar mobilization;Passive range of motion;Dry needling;Energy conservation;Taping;Gait  training;Stair training;DME Instruction;Spinal Manipulations;Joint Manipulations, Canalith repositioning procedure.    PT Next Visit Plan Progress strengthening exercise.     PT Home Exercise Plan [id number].   HEP: [id number]    Consulted and Agree with Plan of Care Patient         Pura Spice, PT, DPT # [phone] 11/26/2021, 8:36 PM

## 2021-11-27 ENCOUNTER — Telehealth: Payer: Self-pay

## 2021-11-27 NOTE — Telephone Encounter (Signed)
Prior authorization initiated via CoverMyMeds for Repatha 140MG /ML.  KEY: [id number]   Response: [employer] is reviewing your PA request and will respond within 24 hours for Medicaid or up to 72 hours for non-Medicaid plans, based on the required timeframe determined by state or federal regulations

## 2021-12-02 ENCOUNTER — Telehealth: Payer: BC Managed Care – PPO | Admitting: Physician Assistant

## 2021-12-02 DIAGNOSIS — B9689 Other specified bacterial agents as the cause of diseases classified elsewhere: Secondary | ICD-10-CM | POA: Diagnosis not present

## 2021-12-02 DIAGNOSIS — J019 Acute sinusitis, unspecified: Secondary | ICD-10-CM

## 2021-12-02 MED ORDER — BENZONATATE 100 MG PO CAPS: 100.0000 mg | ORAL_CAPSULE | Freq: Three times a day (TID) | ORAL | 0 refills | Status: DC | PRN

## 2021-12-02 MED ORDER — PREDNISONE 20 MG PO TABS: 40.0000 mg | ORAL_TABLET | Freq: Every day | ORAL | 0 refills | Status: DC

## 2021-12-02 MED ORDER — DOXYCYCLINE HYCLATE 100 MG PO TABS: 100.0000 mg | ORAL_TABLET | Freq: Two times a day (BID) | ORAL | 0 refills | Status: DC

## 2021-12-02 NOTE — Progress Notes (Signed)

## 2021-12-03 ENCOUNTER — Ambulatory Visit: Payer: BC Managed Care – PPO | Attending: Internal Medicine | Admitting: Physical Therapy

## 2021-12-03 DIAGNOSIS — M25571 Pain in right ankle and joints of right foot: Secondary | ICD-10-CM | POA: Insufficient documentation

## 2021-12-03 DIAGNOSIS — S96919A Strain of unspecified muscle and tendon at ankle and foot level, unspecified foot, initial encounter: Secondary | ICD-10-CM | POA: Insufficient documentation

## 2021-12-03 DIAGNOSIS — M79601 Pain in right arm: Secondary | ICD-10-CM | POA: Insufficient documentation

## 2021-12-03 DIAGNOSIS — M6281 Muscle weakness (generalized): Secondary | ICD-10-CM | POA: Insufficient documentation

## 2021-12-03 DIAGNOSIS — M2560 Stiffness of unspecified joint, not elsewhere classified: Secondary | ICD-10-CM | POA: Diagnosis not present

## 2021-12-03 DIAGNOSIS — S93409A Sprain of unspecified ligament of unspecified ankle, initial encounter: Secondary | ICD-10-CM | POA: Insufficient documentation

## 2021-12-03 NOTE — Therapy (Signed)
OUTPATIENT PHYSICAL THERAPY TREATMENT NOTE Physical Therapy Progress Note   Dates of reporting period  08/13/21  to  12/03/21   Patient Name: Amanda Humphrey MRN: [id number] DOB:01-04-1966, 56 y.o., female Today's Date: 12/03/2021  PCP: Crecencio Mc, MD REFERRING PROVIDER: Crecencio Mc, MD     PT End of Session -     Visit Number 60   Number of Visits 22   Date for PT Re-Evaluation 01/02/22    Authorization - Visit Number 10   Authorization - Number of Visits 10  0814 to 0901   (35 min.)   Activity Tolerance Patient tolerated treatment well;Patient limited by pain    Behavior During Therapy [facility] for tasks assessed/performed          Past Medical History:  Diagnosis Date   Allergy    Chronic kidney disease    stones   Fibrocystic breast disease 2013   Meniere disease    Migraines    Past Surgical History:  Procedure Laterality Date   ABDOMINAL HYSTERECTOMY  2011   laprascopic supracervical, [place]   APPENDECTOMY  1999   BREAST BIOPSY Right Nov 2012    fibrocystic disease, [place]   CESAREAN SECTION     4 total   CHOLECYSTECTOMY  2011   [place]   Patient Active Problem List   Diagnosis Date Noted   H/O of hemilaminectomy 02/15/2019   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[place]) 11/17/2018   Fatigue 09/23/2017   History of pneumonia 04/30/2016   Grief reaction 04/30/2016   Vitamin D deficiency 02/22/2015   Hyperlipidemia 01/01/2015   Visit for preventive health examination 12/04/2013   Lower extremity edema 09/02/2013   Essential hypertension 09/02/2013   History of cardiomyopathy 09/02/2013   SVT (supraventricular tachycardia) 09/02/2013   Palpitations 09/02/2013   Contrast media allergy 03/10/2013   Fibrocystic breast disease    Morbid obesity ([place]) 02/05/2012   Meniere disease     REFERRING DIAG:  M79.18 (ICD-10-CM) - Myalgia, other site  Z98.890 (ICD-10-CM) - Other specified postprocedural states  M54.12  (ICD-10-CM) - Radiculopathy, cervical region    THERAPY DIAG:  Joint stiffness of spine  Pain in right ankle and joints of right foot  Pain in right arm  Sprain and strain of ankle  PERTINENT HISTORY: See evaluation  PRECAUTIONS: N/A  SUBJECTIVE:  (12/03/21)  Pt. Traveling to [place] tomorrow for work.  Pt. Remains busy with work.  Pt. C/o tightness/ stiffness in neck/ upper back.    PAIN:  Are you having pain? Yes: NPRS scale: 5/10 Pain location: Neck Pain description: aching/ stiff/ tight Aggravating factors: prolonged sitting/ activity Relieving factors: stretches/ PT   TODAY'S TREATMENT:   12/03/21:  There.ex.:   Reassessment of cervical/ shoulder AROM.   Manual Therapy:    Supine L/R UT, levator, cervical rotn. Stretches 5x each. Supine cervical grade II-III distraction - 30 s hold x 5.    L/R shoulder AAROM to end-range flexion/ abduction.    Supine and prone STM to L and R upper trap, levator scapulae, posterior deltoid and mid-scapular region/ paraspinals.   Reviewed HEP  Trigger Point Dry Needling (TDN)   Education performed with patient regarding potential benefit of TDN. Reviewed precautions and risks with patient. Reviewed special precautions/risks over lung fields which include pneumothorax. Reviewed signs and symptoms of pneumothorax and advised pt to go to ER immediately if these symptoms develop advise them of dry needling treatment. Pt provided verbal consent to treatment. TDN performed  to L UT/ mid-cervical paraspinals with 4, 0.25 x 40 L-type single needle placements on each side with 2 local twitch response (LTR). Pistoning technique utilized. Improved pain-free motion following intervention.  Good tx. Tolerance. Applied Biofreeze to upper back/ neck after tx.     PATIENT EDUCATION: Education details: HEP Person educated: Patient Education method: Explanation, Media planner, and Handouts Education comprehension: verbalized understanding and  returned demonstration   HOME EXERCISE PROGRAM: [id number].  New HEP: [id number]             PT LONG TERM GOAL #6    Title Pt will improve worse cervical pain by at least 3 NPS points to allow increased tolerance during work and home related ADLs.     Baseline NPS: 8/10 current, 7/10 best, 10/10 Worse. 10/20: Worse pain 7/10.  10/12: 5/10 neck pain at worst (improving)- stress related    Time 8     Period Weeks     Status Partially Met     Target Date 01/02/2022           PT LONG TERM GOAL #7    Title Pt will improve bilat UE MMT by atleast 1/2 grade to facilitate greater functional abilities with ADLs related to caregiving.     Baseline UE MMT R/L:   Shoulder Flexion: 4/4-  Shoulder abduction: 4/4-  Shoulder ER: 4/4  Shoulder IR: 4/4  Elbow ext: 5/5   Eblow flex: 5/5 10/20: UE MMT R/L:   Shoulder Flexion: 4/4  Shoulder abduction: 4/4 Shoulder ER: 4/4  Shoulder IR: 4/4  Elbow ext: 5/5   Elbow flex: 5/5.  10/12: B shoulder flexion 4/5 MMT, abduction 4/5 MMT biceps/triceps 5/5 MMT.     Time 8     Period Weeks     Status Partially Met     Target Date 01/02/2022           PT LONG TERM GOAL #8    Title Pt will improve functional R hand strength equal to L to promote return of hand useage during grooming and feeding.     Baseline Grip R/L: 76.1 / 33.9    Lateral prehension grip R/L: 14 / 4 10/20: Grip R/L: 62.0 / 41.4    Lateral prehension grip R/L: 13 / 6.  1/12:  R 69#/ L 49.8#     Time 8     Period Weeks     Status Partially Met     Target Date 01/02/2022            Plan -     Clinical Impression Statement Tx. focus on manual stretches/ STM to cervical spine/ upper back and HEP to improve to focus on UE strength training.  Decrease noted in overall UT  trigger points and good tolerance to TDN/ STM.  Pain limited with cervical rotn. In seated posture with hypomobility noted.  Pt. understands current HEP and importance of being aware of proper posture/ body mechanics with work-related  tasks and household activities. Pt. Will focus on current UE ex./ cervical stretches to increase pain-free mobility.  Pt. Instructed to contact PT if any questions or concerns.     Personal Factors and Comorbidities Comorbidity 2;Profession;Past/Current Experience    Comorbidities Hypertension, Obesity    Examination-Activity Limitations Squat;Locomotion Level    Examination-Participation Restrictions Occupation;Cleaning;Laundry    Stability/Clinical Decision Making Evolving/Moderate complexity    Clinical Decision Making Moderate    Rehab Potential Good    PT Frequency 1-2x/biweekly   PT Duration 8 weeks  PT Treatment/Interventions ADLs/Self Care Home Management;Aquatic Therapy;Biofeedback;Cryotherapy;Electrical Stimulation;Moist Heat;Ultrasound;Functional mobility training;Therapeutic activities;Therapeutic exercise;Balance training;Neuromuscular re-education;Patient/family education;Manual techniques;Scar mobilization;Passive range of motion;Dry needling;Energy conservation;Taping;Gait training;Stair training;DME Instruction;Spinal Manipulations;Joint Manipulations, Canalith repositioning procedure.    PT Next Visit Plan Progress strengthening exercise.     PT Home Exercise Plan [id number].   HEP: [id number]    Consulted and Agree with Plan of Care Patient         Pura Spice, PT, DPT # [phone] 12/03/2021, 8:21 AM

## 2021-12-05 NOTE — Telephone Encounter (Signed)
Received fax from [employer] stating " We are unable to approve this request for the following reasons: Coverage is provided in situations where the patient's coronary artery calcium or calcification (CAC) score is greater than or equal to 300 Agatston units. Coverage cannot be authorized at this time."

## 2021-12-09 NOTE — Telephone Encounter (Signed)
Noted- to Dr. Mariah Milling to review.

## 2021-12-11 NOTE — Telephone Encounter (Signed)
Attempted to contact pt regarding the below recommendations. Left message to call back  Gollan, Tollie Pizza, MD  [facility] TriageYesterday (7:59 AM)    Repatha not covered as calcium score not high enough We may need to go back to Zetia 10 mg daily Given statin myalgias on Crestor, would she would like to try alternate statin such as Lipitor or simvastatin.  Could try every other day at low-dose? Thx TG

## 2021-12-12 NOTE — Telephone Encounter (Signed)
I called and spoke with the patient. I advised her of the response we received from Dr. Mariah Milling regarding insurance denial of coverage for Repatha due to her low calcium score.  Per the patient, she had received a copy of the denial letter as well. She advised that she has not been taking zetia 10 mg once daily, but will increase to once daily.  I have confirmed with her that in addition to not being able to tolerate crestor, so has also failed simvastatin due to muscle pains.   I discussed the possibility of trailing low dose atorvastatin QOD, but she she preferred to take the zetia 10 mg once daily and then recheck her numbers.  I advised her I will forward to Dr. Mariah Milling as an Lorain Childes and see at what point he would want to recheck her labs. She has also been advised if numbers still are not at goal, a referral to our Lipid clinic team may be needed.  The patient is aware we will call her back once Dr. Mariah Milling provides further recommendations regarding repeat lab work. She voices understanding of all of the above and is agreeable.

## 2021-12-13 NOTE — Telephone Encounter (Signed)
Left message for pt to call back  °

## 2021-12-13 NOTE — Telephone Encounter (Signed)
Amanda Iba, MD  P [facility] Triage  We can see how her numbers go on Zetia alone Thx TG

## 2021-12-16 NOTE — Telephone Encounter (Signed)
Left message to call back  

## 2021-12-17 NOTE — Telephone Encounter (Signed)
Attempted to contact pt x 3. Sent mychart message for review.

## 2021-12-18 ENCOUNTER — Ambulatory Visit: Payer: BC Managed Care – PPO | Admitting: Physical Therapy

## 2021-12-18 DIAGNOSIS — S93409A Sprain of unspecified ligament of unspecified ankle, initial encounter: Secondary | ICD-10-CM | POA: Diagnosis not present

## 2021-12-18 DIAGNOSIS — M79601 Pain in right arm: Secondary | ICD-10-CM

## 2021-12-18 DIAGNOSIS — M25571 Pain in right ankle and joints of right foot: Secondary | ICD-10-CM

## 2021-12-18 DIAGNOSIS — M2560 Stiffness of unspecified joint, not elsewhere classified: Secondary | ICD-10-CM | POA: Diagnosis not present

## 2021-12-18 DIAGNOSIS — M6281 Muscle weakness (generalized): Secondary | ICD-10-CM | POA: Diagnosis not present

## 2021-12-18 DIAGNOSIS — S96919A Strain of unspecified muscle and tendon at ankle and foot level, unspecified foot, initial encounter: Secondary | ICD-10-CM | POA: Diagnosis not present

## 2021-12-18 NOTE — Therapy (Signed)
OUTPATIENT PHYSICAL THERAPY TREATMENT NOTE   Patient Name: Amanda Humphrey MRN: [id number] DOB:[birth year], 56 y.o., female Today's Date: 12/18/2021  PCP: Crecencio Mc, MD REFERRING PROVIDER: Crecencio Mc, MD    PT End of Session -     Visit Number 61   Number of Visits 57   Date for PT Re-Evaluation 01/02/22    Authorization - Visit Number 1   Authorization - Number of Visits 10  [phone] to [id number]   (42 min.)   Activity Tolerance Patient tolerated treatment well;Patient limited by pain    Behavior During Therapy [facility] for tasks assessed/performed          Past Medical History:  Diagnosis Date   Allergy    Chronic kidney disease    stones   Fibrocystic breast disease 2013   Meniere disease    Migraines    Past Surgical History:  Procedure Laterality Date   ABDOMINAL HYSTERECTOMY  2011   laprascopic supracervical, [place]   APPENDECTOMY  1999   BREAST BIOPSY Right Nov 2012    fibrocystic disease, [place]   CESAREAN SECTION     4 total   CHOLECYSTECTOMY  2011   [place]   Patient Active Problem List   Diagnosis Date Noted   H/O of hemilaminectomy 02/15/2019   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[place]) 11/17/2018   Fatigue 09/23/2017   History of pneumonia 04/30/2016   Grief reaction 04/30/2016   Vitamin D deficiency 02/22/2015   Hyperlipidemia 01/01/2015   Visit for preventive health examination 12/04/2013   Lower extremity edema 09/02/2013   Essential hypertension 09/02/2013   History of cardiomyopathy 09/02/2013   SVT (supraventricular tachycardia) 09/02/2013   Palpitations 09/02/2013   Contrast media allergy 03/10/2013   Fibrocystic breast disease    Morbid obesity ([place]) 02/05/2012   Meniere disease     REFERRING DIAG:  M79.18 (ICD-10-CM) - Myalgia, other site  Z98.890 (ICD-10-CM) - Other specified postprocedural states  M54.12 (ICD-10-CM) - Radiculopathy, cervical region    THERAPY DIAG:  Joint stiffness of  spine  Pain in right ankle and joints of right foot  Pain in right arm  Sprain and strain of ankle  Muscle weakness (generalized)  PERTINENT HISTORY: See evaluation  PRECAUTIONS: N/A  SUBJECTIVE:  (12/18/21)  Pt. Had a nice trip to [place] for work.  No new complaints.     PAIN:  Are you having pain? Yes: NPRS scale: 4-5/10 Pain location: Neck Pain description: aching/ stiff/ tight Aggravating factors: prolonged sitting/ activity Relieving factors: stretches/ PT   TODAY'S TREATMENT:   12/18/21:  Manual Therapy:    Supine L/R UT, levator, cervical rotn. Stretches 5x each. Supine cervical grade II-III distraction - 30 s hold x 5.    L/R shoulder AAROM to end-range flexion/ abduction 5x (slight OP).    Supine and prone STM to L and R upper trap, levator scapulae, posterior deltoid and mid-scapular region/ paraspinals.   Seated L/R thoracic rotn. 2x (no c/o pain).    Reviewed HEP  Trigger Point Dry Needling (TDN)   Education performed with patient regarding potential benefit of TDN. Reviewed precautions and risks with patient. Reviewed special precautions/risks over lung fields which include pneumothorax. Reviewed signs and symptoms of pneumothorax and advised pt to go to ER immediately if these symptoms develop advise them of dry needling treatment. Pt provided verbal consent to treatment. TDN performed to L UT/ mid-cervical paraspinals with 5, 0.25 x 40 L-type single needle placements on each side  with 2 local twitch response (LTR). Pistoning technique utilized. Improved pain-free motion following intervention.  Good tx. Tolerance. Applied Biofreeze to upper back/ neck after tx.     PATIENT EDUCATION: Education details: HEP Person educated: Patient Education method: Explanation, Media planner, and Handouts Education comprehension: verbalized understanding and returned demonstration   HOME EXERCISE PROGRAM: [id number].  HEP: [id number]             PT LONG TERM  GOAL 1    Title Pt will improve worse cervical pain by at least 3 NPS points to allow increased tolerance during work and home related ADLs.     Baseline NPS: 8/10 current, 7/10 best, 10/10 Worse. 10/20: Worse pain 7/10.  10/12: 5/10 neck pain at worst (improving)- stress related    Time 8     Period Weeks     Status Partially Met     Target Date 01/02/2022           PT LONG TERM GOAL 2    Title Pt will improve bilat UE MMT by atleast 1/2 grade to facilitate greater functional abilities with ADLs related to caregiving.     Baseline UE MMT R/L:   Shoulder Flexion: 4/4-  Shoulder abduction: 4/4-  Shoulder ER: 4/4  Shoulder IR: 4/4  Elbow ext: 5/5   Eblow flex: 5/5 10/20: UE MMT R/L:   Shoulder Flexion: 4/4  Shoulder abduction: 4/4 Shoulder ER: 4/4  Shoulder IR: 4/4  Elbow ext: 5/5   Elbow flex: 5/5.  10/12: B shoulder flexion 4/5 MMT, abduction 4/5 MMT biceps/triceps 5/5 MMT.     Time 8     Period Weeks     Status Partially Met     Target Date 01/02/2022           PT LONG TERM GOAL 3    Title Pt will improve functional R hand strength equal to L to promote return of hand useage during grooming and feeding.     Baseline Grip R/L: 76.1 / 33.9    Lateral prehension grip R/L: 14 / 4 10/20: Grip R/L: 62.0 / 41.4    Lateral prehension grip R/L: 13 / 6.  1/12:  R 69#/ L 49.8#     Time 8     Period Weeks     Status Partially Met     Target Date 01/02/2022            Plan -     Clinical Impression Statement Tx. focus on manual stretches/ STM to cervical spine/ upper back and HEP to improve to focus on UE strength training.  Decrease noted in overall UT  trigger points and good tolerance to TDN/ STM.  Pain limited with cervical rotn. In seated posture with hypomobility noted.  Pt. understands current HEP and importance of being aware of proper posture/ body mechanics with work-related tasks and household activities. Pt. Will focus on current UE ex./ cervical stretches to increase pain-free mobility.   Pt. Instructed to contact PT if any questions or concerns.     Personal Factors and Comorbidities Comorbidity 2;Profession;Past/Current Experience    Comorbidities Hypertension, Obesity    Examination-Activity Limitations Squat;Locomotion Level    Examination-Participation Restrictions Occupation;Cleaning;Laundry    Stability/Clinical Decision Making Evolving/Moderate complexity    Clinical Decision Making Moderate    Rehab Potential Good    PT Frequency 1-2x/biweekly   PT Duration 8 weeks    PT Treatment/Interventions ADLs/Self Care Home Management;Aquatic Therapy;Biofeedback;Cryotherapy;Electrical Stimulation;Moist Heat;Ultrasound;Functional mobility training;Therapeutic activities;Therapeutic exercise;Balance training;Neuromuscular re-education;Patient/family education;Manual  techniques;Scar mobilization;Passive range of motion;Dry needling;Energy conservation;Taping;Gait training;Stair training;DME Instruction;Spinal Manipulations;Joint Manipulations, Canalith repositioning procedure.    PT Next Visit Plan Progress strengthening exercise.     PT Home Exercise Plan [id number].   HEP: [id number]    Consulted and Agree with Plan of Care Patient         Pura Spice, PT, DPT # [id number] 12/18/2021, 12:24 PM

## 2021-12-24 ENCOUNTER — Ambulatory Visit: Payer: BC Managed Care – PPO | Admitting: Physical Therapy

## 2021-12-24 DIAGNOSIS — S96919A Strain of unspecified muscle and tendon at ankle and foot level, unspecified foot, initial encounter: Secondary | ICD-10-CM | POA: Diagnosis not present

## 2021-12-24 DIAGNOSIS — M2560 Stiffness of unspecified joint, not elsewhere classified: Secondary | ICD-10-CM

## 2021-12-24 DIAGNOSIS — M6281 Muscle weakness (generalized): Secondary | ICD-10-CM | POA: Diagnosis not present

## 2021-12-24 DIAGNOSIS — M79601 Pain in right arm: Secondary | ICD-10-CM | POA: Diagnosis not present

## 2021-12-24 DIAGNOSIS — S93409A Sprain of unspecified ligament of unspecified ankle, initial encounter: Secondary | ICD-10-CM | POA: Diagnosis not present

## 2021-12-24 DIAGNOSIS — M25571 Pain in right ankle and joints of right foot: Secondary | ICD-10-CM | POA: Diagnosis not present

## 2021-12-26 NOTE — Therapy (Signed)
OUTPATIENT PHYSICAL THERAPY TREATMENT NOTE   Patient Name: Amanda Humphrey MRN: [id number] DOB:Sep 09, 1965, 56 y.o., female Today's Date: 12/24/2021  PCP: Crecencio Mc, MD REFERRING PROVIDER: Crecencio Mc, MD    PT End of Session -     Visit Number 22   Number of Visits 104   Date for PT Re-Evaluation 01/02/22    Authorization - Visit Number 2   Authorization - Number of Visits 10 [phone] to [phone]   (42 min.)   Activity Tolerance Patient tolerated treatment well;Patient limited by pain    Behavior During Therapy [facility] for tasks assessed/performed          Past Medical History:  Diagnosis Date   Allergy    Chronic kidney disease    stones   Fibrocystic breast disease 2013   Meniere disease    Migraines    Past Surgical History:  Procedure Laterality Date   ABDOMINAL HYSTERECTOMY  2011   laprascopic supracervical, [place]   APPENDECTOMY  1999   BREAST BIOPSY Right Nov 2012    fibrocystic disease, [place]   CESAREAN SECTION     4 total   CHOLECYSTECTOMY  2011   [place]   Patient Active Problem List   Diagnosis Date Noted   H/O of hemilaminectomy 02/15/2019   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[place]) 11/17/2018   Fatigue 09/23/2017   History of pneumonia 04/30/2016   Grief reaction 04/30/2016   Vitamin D deficiency 02/22/2015   Hyperlipidemia 01/01/2015   Visit for preventive health examination 12/04/2013   Lower extremity edema 09/02/2013   Essential hypertension 09/02/2013   History of cardiomyopathy 09/02/2013   SVT (supraventricular tachycardia) 09/02/2013   Palpitations 09/02/2013   Contrast media allergy 03/10/2013   Fibrocystic breast disease    Morbid obesity ([place]) 02/05/2012   Meniere disease     REFERRING DIAG:  M79.18 (ICD-10-CM) - Myalgia, other site  Z98.890 (ICD-10-CM) - Other specified postprocedural states  M54.12 (ICD-10-CM) - Radiculopathy, cervical region    THERAPY DIAG:  Joint stiffness of  spine  Pain in right arm  Muscle weakness (generalized)  PERTINENT HISTORY: See evaluation  PRECAUTIONS: N/A  SUBJECTIVE:  (12/24/21)  Pt. Reports discomfort in R cervical spine.  Pt. Leaving for trip to [place] tomorrow.  Pt. Arrived to PT on a work call and tx. Was limited to a manual focus/ TDN.     PAIN:  Are you having pain? Yes: NPRS scale: 4-5/10 Pain location: Neck Pain description: aching/ stiff/ tight Aggravating factors: prolonged sitting/ activity Relieving factors: stretches/ PT   TODAY'S TREATMENT:   12/24/21:  Manual Therapy:    Supine L/R UT, levator, cervical rotn. Stretches 5x each. Supine cervical grade II-III distraction - 30 s hold x 5.    L/R shoulder AAROM to end-range flexion/ abduction 5x (slight OP).    Supine and prone STM to L and R upper trap, levator scapulae, posterior deltoid and mid-scapular region/ paraspinals.   Seated L/R thoracic rotn. 2x (no c/o pain).     Trigger Point Dry Needling (TDN)   Education performed with patient regarding potential benefit of TDN. Reviewed precautions and risks with patient. Reviewed special precautions/risks over lung fields which include pneumothorax. Reviewed signs and symptoms of pneumothorax and advised pt to go to ER immediately if these symptoms develop advise them of dry needling treatment. Pt provided verbal consent to treatment. TDN performed to L and R UT/ mid-cervical paraspinals with 6, 0.25 x 40 L-type single needle  placements on each side with 1 local twitch response (LTR). Pistoning technique utilized. Improved pain-free motion following intervention.  Good tx. Tolerance. Applied Biofreeze to upper back/ neck after tx.     PATIENT EDUCATION: Education details: HEP Person educated: Patient Education method: Explanation, Media planner, and Handouts Education comprehension: verbalized understanding and returned demonstration   HOME EXERCISE PROGRAM: [id number].  HEP: [id number]              PT LONG TERM GOAL 1    Title Pt will improve worse cervical pain by at least 3 NPS points to allow increased tolerance during work and home related ADLs.     Baseline NPS: 8/10 current, 7/10 best, 10/10 Worse. 10/20: Worse pain 7/10.  10/12: 5/10 neck pain at worst (improving)- stress related    Time 8     Period Weeks     Status Partially Met     Target Date 01/02/2022           PT LONG TERM GOAL 2    Title Pt will improve bilat UE MMT by atleast 1/2 grade to facilitate greater functional abilities with ADLs related to caregiving.     Baseline UE MMT R/L:   Shoulder Flexion: 4/4-  Shoulder abduction: 4/4-  Shoulder ER: 4/4  Shoulder IR: 4/4  Elbow ext: 5/5   Eblow flex: 5/5 10/20: UE MMT R/L:   Shoulder Flexion: 4/4  Shoulder abduction: 4/4 Shoulder ER: 4/4  Shoulder IR: 4/4  Elbow ext: 5/5   Elbow flex: 5/5.  10/12: B shoulder flexion 4/5 MMT, abduction 4/5 MMT biceps/triceps 5/5 MMT.     Time 8     Period Weeks     Status Partially Met     Target Date 01/02/2022           PT LONG TERM GOAL 3    Title Pt will improve functional R hand strength equal to L to promote return of hand useage during grooming and feeding.     Baseline Grip R/L: 76.1 / 33.9    Lateral prehension grip R/L: 14 / 4 10/20: Grip R/L: 62.0 / 41.4    Lateral prehension grip R/L: 13 / 6.  1/12:  R 69#/ L 49.8#     Time 8     Period Weeks     Status Partially Met     Target Date 01/02/2022            Plan -     Clinical Impression Statement Pt. As soreness in R UT/cervical spine after tx. But increase rotn noted in supine.  No pain with seated thoracic rotn. After tx. And PT reassess cervical AROM.  Tx. focus on manual stretches/ STM to cervical spine/ upper back and HEP to improve to focus on UE strength training.  Decrease noted in overall UT  trigger points and good tolerance to TDN/ STM.  Pain limited with cervical rotn. In seated posture with hypomobility noted.  Pt. understands current HEP and importance of  being aware of proper posture/ body mechanics with work-related tasks and household activities. Pt. Will focus on current UE ex./ cervical stretches to increase pain-free mobility.  Pt. Instructed to contact PT if any questions or concerns.     Personal Factors and Comorbidities Comorbidity 2;Profession;Past/Current Experience    Comorbidities Hypertension, Obesity    Examination-Activity Limitations Squat;Locomotion Level    Examination-Participation Restrictions Occupation;Cleaning;Laundry    Stability/Clinical Decision Making Evolving/Moderate complexity    Clinical Decision Making Moderate  Rehab Potential Good    PT Frequency 1-2x/biweekly   PT Duration 8 weeks    PT Treatment/Interventions ADLs/Self Care Home Management;Aquatic Therapy;Biofeedback;Cryotherapy;Electrical Stimulation;Moist Heat;Ultrasound;Functional mobility training;Therapeutic activities;Therapeutic exercise;Balance training;Neuromuscular re-education;Patient/family education;Manual techniques;Scar mobilization;Passive range of motion;Dry needling;Energy conservation;Taping;Gait training;Stair training;DME Instruction;Spinal Manipulations;Joint Manipulations, Canalith repositioning procedure.    PT Next Visit Plan Progress strengthening exercise.     PT Home Exercise Plan [id number].   HEP: [id number]    Consulted and Agree with Plan of Care Patient         Pura Spice, PT, DPT # [phone] 12/26/2021, 6:27 PM

## 2021-12-31 ENCOUNTER — Ambulatory Visit: Payer: BC Managed Care – PPO | Attending: Internal Medicine | Admitting: Physical Therapy

## 2021-12-31 DIAGNOSIS — M79601 Pain in right arm: Secondary | ICD-10-CM | POA: Insufficient documentation

## 2021-12-31 DIAGNOSIS — M6281 Muscle weakness (generalized): Secondary | ICD-10-CM | POA: Insufficient documentation

## 2021-12-31 DIAGNOSIS — M2560 Stiffness of unspecified joint, not elsewhere classified: Secondary | ICD-10-CM | POA: Diagnosis not present

## 2021-12-31 NOTE — Therapy (Incomplete)
12/31/21:                           L rotn.: 70 deg./ R rotn. 51 deg.  (R cervical tightness/ catching).  L UT feels tight.   Cervical flexion 60 deg./ extension 38 deg.  L lat. Flexion 38 deg./ R lat. Flexion 38 deg.  B shoulder flexion/ abduction 4/5 MMT (increase R UT/ shoulder discomfort).  Grip: L 76.7#/ R 52.1#.  Reassessed R hand/finger manual dexterity.

## 2021-12-31 NOTE — Therapy (Signed)
OUTPATIENT PHYSICAL THERAPY TREATMENT NOTE   Patient Name: Amanda Humphrey MRN: [id number] DOB:1965/11/05, 56 y.o., female Today's Date: 12/31/2021  PCP: Crecencio Mc, MD REFERRING PROVIDER: Crecencio Mc, MD    PT End of Session -     Visit Number 63   Number of Visits 54   Date for PT Re-Evaluation 01/02/22    Authorization - Visit Number 3   Authorization - Number of Visits [id number] [phone] to [phone]   (41 min.)   Activity Tolerance Patient tolerated treatment well;Patient limited by pain    Behavior During Therapy [facility] for tasks assessed/performed          Past Medical History:  Diagnosis Date   Allergy    Chronic kidney disease    stones   Fibrocystic breast disease 2013   Meniere disease    Migraines    Past Surgical History:  Procedure Laterality Date   ABDOMINAL HYSTERECTOMY  2011   laprascopic supracervical, [place]   APPENDECTOMY  1999   BREAST BIOPSY Right Nov 2012    fibrocystic disease, [place]   CESAREAN SECTION     4 total   CHOLECYSTECTOMY  2011   [place]   Patient Active Problem List   Diagnosis Date Noted   H/O of hemilaminectomy 02/15/2019   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[place]) 11/17/2018   Fatigue 09/23/2017   History of pneumonia 04/30/2016   Grief reaction 04/30/2016   Vitamin D deficiency 02/22/2015   Hyperlipidemia 01/01/2015   Visit for preventive health examination 12/04/2013   Lower extremity edema 09/02/2013   Essential hypertension 09/02/2013   History of cardiomyopathy 09/02/2013   SVT (supraventricular tachycardia) 09/02/2013   Palpitations 09/02/2013   Contrast media allergy 03/10/2013   Fibrocystic breast disease    Morbid obesity ([place]) 02/05/2012   Meniere disease     REFERRING DIAG:  M79.18 (ICD-10-CM) - Myalgia, other site  Z98.890 (ICD-10-CM) - Other specified postprocedural states  M54.12 (ICD-10-CM) - Radiculopathy, cervical region    THERAPY DIAG:  Joint stiffness of  spine  Pain in right arm  Muscle weakness (generalized)  PERTINENT HISTORY: See evaluation  PRECAUTIONS: N/A  SUBJECTIVE:  (12/31/21)  Pt. Had a nice time at work trip to [place].  No new complaints.  Pt. States she notices improvement in neck pain/ symptoms when having PT 1x/week vs. Biweekly.     PAIN:  Are you having pain? Yes: NPRS scale: 4-5/10 Pain location: Neck Pain description: aching/ stiff/ tight Aggravating factors: prolonged sitting/ activity Relieving factors: stretches/ PT   TODAY'S TREATMENT:   12/31/21:    Cervical AROM: L rotn.: 70 deg./ R rotn. 51 deg.  (R cervical tightness/ catching).  L UT feels tight.   Cervical flexion 60 deg./ extension 38 deg.  L lat. Flexion 38 deg./ R lat. Flexion 38 deg.  Seated B shoulder flexion/ abduction 4/5 MMT (increase R UT/ shoulder discomfort).  Grip: L 76.7#/ R 52.1#.  Reassessed R hand/finger manual dexterity.    Manual Therapy:    Supine L/R UT, levator, cervical rotn. Stretches 5x each. Supine cervical grade II-III distraction - 30 s hold x 5.    L/R shoulder AAROM to end-range flexion/ abduction 5x (slight OP).    Supine and prone STM to L and R upper trap, levator scapulae, posterior deltoid and mid-scapular region/ paraspinals.   Seated L/R thoracic rotn. 2x (no c/o pain) at end of tx.     Trigger Point Dry Needling (TDN)   Education  performed with patient regarding potential benefit of TDN. Reviewed precautions and risks with patient. Reviewed special precautions/risks over lung fields which include pneumothorax. Reviewed signs and symptoms of pneumothorax and advised pt to go to ER immediately if these symptoms develop advise them of dry needling treatment. Pt provided verbal consent to treatment. TDN performed to L and R UT/ mid-cervical paraspinals with 4, 0.25 x 40 L-type single needle placements on each side with 1 local twitch response (LTR). Pistoning technique utilized. Improved pain-free motion following  intervention.  Good tx. Tolerance. Applied Biofreeze to upper back/ neck after tx.     PATIENT EDUCATION: Education details: HEP Person educated: Patient Education method: Explanation, Media planner, and Handouts Education comprehension: verbalized understanding and returned demonstration   HOME EXERCISE PROGRAM: [id number].  HEP: [id number]             PT LONG TERM GOAL 1    Title Pt will improve worse cervical pain by at least 3 NPS points to allow increased tolerance during work and home related ADLs.     Baseline NPS: 8/10 current, 7/10 best, 10/10 Worse. 10/20: Worse pain 7/10.  10/12: 5/10 neck pain at worst (improving)- stress related    Time 8     Period Weeks     Status Partially Met     Target Date 01/02/2022           PT LONG TERM GOAL 2    Title Pt will improve bilat UE MMT by atleast 1/2 grade to facilitate greater functional abilities with ADLs related to caregiving.     Baseline UE MMT R/L:   Shoulder Flexion: 4/4-  Shoulder abduction: 4/4-  Shoulder ER: 4/4  Shoulder IR: 4/4  Elbow ext: 5/5   Eblow flex: 5/5 10/20: UE MMT R/L:   Shoulder Flexion: 4/4  Shoulder abduction: 4/4 Shoulder ER: 4/4  Shoulder IR: 4/4  Elbow ext: 5/5   Elbow flex: 5/5.  10/12: B shoulder flexion 4/5 MMT, abduction 4/5 MMT biceps/triceps 5/5 MMT.     Time 8     Period Weeks     Status Partially Met     Target Date 01/02/2022           PT LONG TERM GOAL 3    Title Pt will improve functional R hand strength equal to L to promote return of hand useage during grooming and feeding.     Baseline Grip R/L: 76.1 / 33.9    Lateral prehension grip R/L: 14 / 4 10/20: Grip R/L: 62.0 / 41.4    Lateral prehension grip R/L: 13 / 6.  1/12:  R 69#/ L 49.8#     Time 8     Period Weeks     Status Partially Met     Target Date 01/02/2022            Plan -     Clinical Impression Statement See updated cervical/ shoulder AROM.  Increase L UT muscle tightness/ trigger point noted.  No pain with seated thoracic  rotn. After tx. And PT reassess cervical AROM.  Tx. focus on manual stretches/ STM to cervical spine/ upper back and HEP to improve to focus on UE strength training.  Decrease noted in overall UT  trigger points and good tolerance to TDN/ STM.  Pain limited with cervical rotn. In seated posture with hypomobility noted.  Pt. understands current HEP and importance of being aware of proper posture/ body mechanics with work-related tasks and household activities. Pt. Will focus  on current UE ex./ cervical stretches to increase pain-free mobility.  Pt. Instructed to contact PT if any questions or concerns.     Personal Factors and Comorbidities Comorbidity 2;Profession;Past/Current Experience    Comorbidities Hypertension, Obesity    Examination-Activity Limitations Squat;Locomotion Level    Examination-Participation Restrictions Occupation;Cleaning;Laundry    Stability/Clinical Decision Making Evolving/Moderate complexity    Clinical Decision Making Moderate    Rehab Potential Good    PT Frequency 1-2x/biweekly   PT Duration 8 weeks    PT Treatment/Interventions ADLs/Self Care Home Management;Aquatic Therapy;Biofeedback;Cryotherapy;Electrical Stimulation;Moist Heat;Ultrasound;Functional mobility training;Therapeutic activities;Therapeutic exercise;Balance training;Neuromuscular re-education;Patient/family education;Manual techniques;Scar mobilization;Passive range of motion;Dry needling;Energy conservation;Taping;Gait training;Stair training;DME Instruction;Spinal Manipulations;Joint Manipulations, Canalith repositioning procedure.    PT Next Visit Plan RECERT/ update goals.    PT Home Exercise Plan [id number].   HEP: [id number]    Consulted and Agree with Plan of Care Patient         Pura Spice, PT, DPT # [phone] 01/04/2022, 2:41 PM

## 2022-01-07 ENCOUNTER — Ambulatory Visit: Payer: BC Managed Care – PPO | Admitting: Physical Therapy

## 2022-01-07 DIAGNOSIS — M6281 Muscle weakness (generalized): Secondary | ICD-10-CM

## 2022-01-07 DIAGNOSIS — M79601 Pain in right arm: Secondary | ICD-10-CM

## 2022-01-07 DIAGNOSIS — M2560 Stiffness of unspecified joint, not elsewhere classified: Secondary | ICD-10-CM

## 2022-01-08 NOTE — Telephone Encounter (Signed)
MyChart messgae sent to patient. 

## 2022-01-08 NOTE — Telephone Encounter (Signed)
Error

## 2022-01-10 NOTE — Therapy (Signed)
OUTPATIENT PHYSICAL THERAPY TREATMENT NOTE/ RECERTIFICATION   Patient Name: Amanda Humphrey MRN: [id number] DOB:07/27/1965, 56 y.o., female Today's Date: 01/07/2022  PCP: Crecencio Mc, MD REFERRING PROVIDER: Crecencio Mc, MD    PT End of Session -     Visit Number 21   Number of Visits 43   Date for PT Re-Evaluation 03/04/22    Authorization - Visit Number 4   Authorization - Number of Visits [id number] to [phone]   (46 min.)   Activity Tolerance Patient tolerated treatment well;Patient limited by pain    Behavior During Therapy [facility] for tasks assessed/performed          Past Medical History:  Diagnosis Date   Allergy    Chronic kidney disease    stones   Fibrocystic breast disease 2013   Meniere disease    Migraines    Past Surgical History:  Procedure Laterality Date   ABDOMINAL HYSTERECTOMY  2011   laprascopic supracervical, [place]   APPENDECTOMY  1999   BREAST BIOPSY Right Nov 2012    fibrocystic disease, [place]   CESAREAN SECTION     4 total   CHOLECYSTECTOMY  2011   [place]   Patient Active Problem List   Diagnosis Date Noted   H/O of hemilaminectomy 02/15/2019   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[place]) 11/17/2018   Fatigue 09/23/2017   History of pneumonia 04/30/2016   Grief reaction 04/30/2016   Vitamin D deficiency 02/22/2015   Hyperlipidemia 01/01/2015   Visit for preventive health examination 12/04/2013   Lower extremity edema 09/02/2013   Essential hypertension 09/02/2013   History of cardiomyopathy 09/02/2013   SVT (supraventricular tachycardia) 09/02/2013   Palpitations 09/02/2013   Contrast media allergy 03/10/2013   Fibrocystic breast disease    Morbid obesity ([place]) 02/05/2012   Meniere disease     REFERRING DIAG:  M79.18 (ICD-10-CM) - Myalgia, other site  Z98.890 (ICD-10-CM) - Other specified postprocedural states  M54.12 (ICD-10-CM) - Radiculopathy, cervical region    THERAPY DIAG:  Joint  stiffness of spine  Pain in right arm  Muscle weakness (generalized)  PERTINENT HISTORY: See evaluation  PRECAUTIONS: N/A  SUBJECTIVE:  (01/07/22)  Pt. States she notices improvement in neck pain/ symptoms when having PT 1x/week vs. Biweekly.     PAIN:  Are you having pain? Yes: NPRS scale: 4-5/10 Pain location: Neck Pain description: aching/ stiff/ tight Aggravating factors: prolonged sitting/ activity Relieving factors: stretches/ PT  12/5: Cervical AROM: L rotn.: 70 deg./ R rotn. 51 deg.  (R cervical tightness/ catching).  L UT feels tight.   Cervical flexion 60 deg./ extension 38 deg.  L lat. Flexion 38 deg./ R lat. Flexion 38 deg.  Seated B shoulder flexion/ abduction 4/5 MMT (increase R UT/ shoulder discomfort).  Grip: L 76.7#/ R 52.1#.  Reassessed R hand/finger manual dexterity.     TODAY'S TREATMENT:   01/07/22:  Manual Therapy:    Seated L/R UT/ posterior deltoid STM.  Reassessment of cervical AROM/ rotn with OP (no increase c/o pain).    Supine L/R UT, levator, cervical rotn. Stretches 5x each. Supine cervical grade II-III distraction - 30 s hold x 5.    L/R shoulder AAROM to end-range flexion/ abduction 5x (slight OP).    Supine and prone STM to L and R upper trap, levator scapulae, posterior deltoid and mid-scapular region/ paraspinals.   Seated L/R thoracic rotn. 2x (no c/o pain) at end of tx.     Trigger Point  Dry Needling (TDN)   Education performed with patient regarding potential benefit of TDN. Reviewed precautions and risks with patient. Reviewed special precautions/risks over lung fields which include pneumothorax. Reviewed signs and symptoms of pneumothorax and advised pt to go to ER immediately if these symptoms develop advise them of dry needling treatment. Pt provided verbal consent to treatment. TDN performed to L and R UT/ mid-cervical paraspinals with 4, 0.25 x 40 L-type single needle placements on each side with 1 local twitch response (LTR).  Pistoning technique utilized. Improved pain-free motion following intervention.  Good tx. Tolerance. Applied Biofreeze to upper back/ neck after tx.     PATIENT EDUCATION: Education details: HEP Person educated: Patient Education method: Explanation, Media planner, and Handouts Education comprehension: verbalized understanding and returned demonstration   HOME EXERCISE PROGRAM: [id number].  HEP: [id number]             PT LONG TERM GOAL 1    Title Pt will improve worse cervical pain by at least 3 NPS points to allow increased tolerance during work and home related ADLs.     Baseline NPS: 8/10 current, 7/10 best, 10/10 Worse. 10/20: Worse pain 7/10.  10/12: 5/10 neck pain at worst (improving)- stress related    Time 8     Period Weeks     Status Partially Met     Target Date 03/04/2022          PT LONG TERM GOAL 2    Title Pt will improve bilat UE MMT by atleast 1/2 grade to facilitate greater functional abilities with ADLs related to caregiving.     Baseline UE MMT R/L:   Shoulder Flexion: 4/4-  Shoulder abduction: 4/4-  Shoulder ER: 4/4  Shoulder IR: 4/4  Elbow ext: 5/5   Eblow flex: 5/5 10/20: UE MMT R/L:   Shoulder Flexion: 4/4  Shoulder abduction: 4/4 Shoulder ER: 4/4  Shoulder IR: 4/4  Elbow ext: 5/5   Elbow flex: 5/5.  10/12: B shoulder flexion 4/5 MMT, abduction 4/5 MMT biceps/triceps 5/5 MMT.     Time 8     Period Weeks     Status Partially Met     Target Date 03/04/2022           PT LONG TERM GOAL 3    Title Pt will improve functional R hand strength equal to L to promote return of hand useage during grooming and feeding.     Baseline Grip R/L: 76.1 / 33.9    Lateral prehension grip R/L: 14 / 4 10/20: Grip R/L: 62.0 / 41.4    Lateral prehension grip R/L: 13 / 6.  1/12:  R 69#/ L 49.8#     Time 8     Period Weeks     Status Partially Met     Target Date 03/04/2022            Plan -     Clinical Impression Statement Pt. Presents with increase cervical/ shoulder AROM in a  pain tolerable range.  Pt. Reports muscle knots/ tightness in UT musculature.  No pain with seated thoracic rotn. After tx. And PT reassess cervical AROM.  Tx. focus on manual stretches/ STM to cervical spine/ upper back and HEP to improve to focus on UE strength training.  Decrease noted in overall UT  trigger points and good tolerance to TDN/ STM.  Pain limited with cervical rotn. In seated posture with hypomobility noted.  Pt. understands current HEP and importance of being aware of proper  posture/ body mechanics with work-related tasks and household activities. Pt. Will focus on current UE ex./ cervical stretches to increase pain-free mobility.  Pt. Instructed to contact PT if any questions or concerns.     Personal Factors and Comorbidities Comorbidity 2;Profession;Past/Current Experience    Comorbidities Hypertension, Obesity    Examination-Activity Limitations Squat;Locomotion Level    Examination-Participation Restrictions Occupation;Cleaning;Laundry    Stability/Clinical Decision Making Evolving/Moderate complexity    Clinical Decision Making Moderate    Rehab Potential Good    PT Frequency 1-2x/biweekly   PT Duration 8 weeks    PT Treatment/Interventions ADLs/Self Care Home Management;Aquatic Therapy;Biofeedback;Cryotherapy;Electrical Stimulation;Moist Heat;Ultrasound;Functional mobility training;Therapeutic activities;Therapeutic exercise;Balance training;Neuromuscular re-education;Patient/family education;Manual techniques;Scar mobilization;Passive range of motion;Dry needling;Energy conservation;Taping;Gait training;Stair training;DME Instruction;Spinal Manipulations;Joint Manipulations, Canalith repositioning procedure.    PT Next Visit Plan Issue new HEP   PT Home Exercise Plan [id number].   HEP: [id number]    Consulted and Agree with Plan of Care Patient         Pura Spice, PT, DPT # [phone] 01/10/2022, 10:06 AM

## 2022-01-15 ENCOUNTER — Ambulatory Visit: Payer: BC Managed Care – PPO | Admitting: Physical Therapy

## 2022-01-15 DIAGNOSIS — M79601 Pain in right arm: Secondary | ICD-10-CM | POA: Diagnosis not present

## 2022-01-15 DIAGNOSIS — M6281 Muscle weakness (generalized): Secondary | ICD-10-CM | POA: Diagnosis not present

## 2022-01-15 DIAGNOSIS — M2560 Stiffness of unspecified joint, not elsewhere classified: Secondary | ICD-10-CM

## 2022-01-15 NOTE — Therapy (Signed)
OUTPATIENT PHYSICAL THERAPY TREATMENT NOTE   Patient Name: Amanda Humphrey MRN: [id number] DOB:02-07-1965, 56 y.o., female Today's Date: 01/15/2022  PCP: Crecencio Mc, MD REFERRING PROVIDER: Crecencio Mc, MD    PT End of Session -     Visit Number 65   Number of Visits 67   Date for PT Re-Evaluation 03/04/22    Authorization - Visit Number 5   Authorization - Number of Visits 10 [phone] to [phone]   (46 min.)   Activity Tolerance Patient tolerated treatment well;Patient limited by pain    Behavior During Therapy [place] for tasks assessed/performed          Past Medical History:  Diagnosis Date   Allergy    Chronic kidney disease    stones   Fibrocystic breast disease 2013   Meniere disease    Migraines    Past Surgical History:  Procedure Laterality Date   ABDOMINAL HYSTERECTOMY  2011   laprascopic supracervical, [place]   APPENDECTOMY  1999   BREAST BIOPSY Right Nov 2012    fibrocystic disease, [place]   CESAREAN SECTION     4 total   CHOLECYSTECTOMY  2011   [place]   Patient Active Problem List   Diagnosis Date Noted   H/O of hemilaminectomy 02/15/2019   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[place]) 11/17/2018   Fatigue 09/23/2017   History of pneumonia 04/30/2016   Grief reaction 04/30/2016   Vitamin D deficiency 02/22/2015   Hyperlipidemia 01/01/2015   Visit for preventive health examination 12/04/2013   Lower extremity edema 09/02/2013   Essential hypertension 09/02/2013   History of cardiomyopathy 09/02/2013   SVT (supraventricular tachycardia) 09/02/2013   Palpitations 09/02/2013   Contrast media allergy 03/10/2013   Fibrocystic breast disease    Morbid obesity ([place]) 02/05/2012   Meniere disease     REFERRING DIAG:  M79.18 (ICD-10-CM) - Myalgia, other site  Z98.890 (ICD-10-CM) - Other specified postprocedural states  M54.12 (ICD-10-CM) - Radiculopathy, cervical region    THERAPY DIAG:  Joint stiffness of  spine  Pain in right arm  Muscle weakness (generalized)  PERTINENT HISTORY: See evaluation  PRECAUTIONS: N/A  SUBJECTIVE:  (01/15/22)  Pt. Reports no new complaints.  Pt. Has been busy with work-related tasks and upcoming Christmas weekend.      PAIN:  Are you having pain? Yes: NPRS scale: 4-5/10 Pain location: Neck Pain description: aching/ stiff/ tight Aggravating factors: prolonged sitting/ activity Relieving factors: stretches/ PT  12/5: Cervical AROM: L rotn.: 70 deg./ R rotn. 51 deg.  (R cervical tightness/ catching).  L UT feels tight.   Cervical flexion 60 deg./ extension 38 deg.  L lat. Flexion 38 deg./ R lat. Flexion 38 deg.  Seated B shoulder flexion/ abduction 4/5 MMT (increase R UT/ shoulder discomfort).  Grip: L 76.7#/ R 52.1#.  Reassessed R hand/finger manual dexterity.     TODAY'S TREATMENT:   01/15/22:  Manual Therapy:    Trigger Point Dry Needling (TDN)   Education performed with patient regarding potential benefit of TDN. Reviewed precautions and risks with patient. Reviewed special precautions/risks over lung fields which include pneumothorax. Reviewed signs and symptoms of pneumothorax and advised pt to go to ER immediately if these symptoms develop advise them of dry needling treatment. Pt provided verbal consent to treatment. TDN performed to L and R UT/ mid-cervical paraspinals with 5, 0.25 x 40 L-type single needle placements on each side with 2 local twitch response (LTR) in R UT region. Pistoning  technique utilized. Improved pain-free motion following intervention.  Good tx. Tolerance. Applied Biofreeze to upper back/ neck after tx.   Supine L/R UT, levator, cervical rotn. Stretches 5x each. Supine cervical grade II-III distraction - 30 s hold x 5.    L/R shoulder AAROM to end-range flexion/ abduction 5x (slight OP).    Supine and prone STM to L and R upper trap, levator scapulae, posterior deltoid and mid-scapular region/ paraspinals.   Seated L/R  thoracic rotn. 2x (no c/o pain) at end of tx.      PATIENT EDUCATION: Education details: HEP Person educated: Patient Education method: Explanation, Media planner, and Handouts Education comprehension: verbalized understanding and returned demonstration   HOME EXERCISE PROGRAM: [id number].  HEP: [id number]             PT LONG TERM GOAL 1    Title Pt will improve worse cervical pain by at least 3 NPS points to allow increased tolerance during work and home related ADLs.     Baseline NPS: 8/10 current, 7/10 best, 10/10 Worse. 10/20: Worse pain 7/10.  10/12: 5/10 neck pain at worst (improving)- stress related    Time 8     Period Weeks     Status Partially Met     Target Date 03/04/2022          PT LONG TERM GOAL 2    Title Pt will improve bilat UE MMT by atleast 1/2 grade to facilitate greater functional abilities with ADLs related to caregiving.     Baseline UE MMT R/L:   Shoulder Flexion: 4/4-  Shoulder abduction: 4/4-  Shoulder ER: 4/4  Shoulder IR: 4/4  Elbow ext: 5/5   Eblow flex: 5/5 10/20: UE MMT R/L:   Shoulder Flexion: 4/4  Shoulder abduction: 4/4 Shoulder ER: 4/4  Shoulder IR: 4/4  Elbow ext: 5/5   Elbow flex: 5/5.  10/12: B shoulder flexion 4/5 MMT, abduction 4/5 MMT biceps/triceps 5/5 MMT.     Time 8     Period Weeks     Status Partially Met     Target Date 03/04/2022           PT LONG TERM GOAL 3    Title Pt will improve functional R hand strength equal to L to promote return of hand useage during grooming and feeding.     Baseline Grip R/L: 76.1 / 33.9    Lateral prehension grip R/L: 14 / 4 10/20: Grip R/L: 62.0 / 41.4    Lateral prehension grip R/L: 13 / 6.  1/12:  R 69#/ L 49.8#     Time 8     Period Weeks     Status Partially Met     Target Date 03/04/2022            Plan -     Clinical Impression Statement Pt. Presents with increase cervical/ shoulder AROM in a pain tolerable range.  Pt. Reports muscle knots/ tightness in UT musculature.  No pain with seated  thoracic rotn. After tx. And PT reassess cervical AROM.  Tx. focus on manual stretches/ STM to cervical spine/ upper back and HEP to improve to focus on UE strength training.  Decrease noted in overall UT  trigger points and good tolerance to TDN/ STM.  Pain limited with cervical rotn. In seated posture with hypomobility noted.  Pt. understands current HEP and importance of being aware of proper posture/ body mechanics with work-related tasks and household activities. Pt. Will focus on current UE ex./  cervical stretches to increase pain-free mobility.  Pt. Instructed to contact PT if any questions or concerns.     Personal Factors and Comorbidities Comorbidity 2;Profession;Past/Current Experience    Comorbidities Hypertension, Obesity    Examination-Activity Limitations Squat;Locomotion Level    Examination-Participation Restrictions Occupation;Cleaning;Laundry    Stability/Clinical Decision Making Evolving/Moderate complexity    Clinical Decision Making Moderate    Rehab Potential Good    PT Frequency 1-2x/biweekly   PT Duration 8 weeks    PT Treatment/Interventions ADLs/Self Care Home Management;Aquatic Therapy;Biofeedback;Cryotherapy;Electrical Stimulation;Moist Heat;Ultrasound;Functional mobility training;Therapeutic activities;Therapeutic exercise;Balance training;Neuromuscular re-education;Patient/family education;Manual techniques;Scar mobilization;Passive range of motion;Dry needling;Energy conservation;Taping;Gait training;Stair training;DME Instruction;Spinal Manipulations;Joint Manipulations, Canalith repositioning procedure.    PT Next Visit Plan Issue new HEP   PT Home Exercise Plan [id number].   HEP: [id number]    Consulted and Agree with Plan of Care Patient         Pura Spice, PT, DPT # [phone] 01/15/2022, 7:54 PM

## 2022-01-22 ENCOUNTER — Encounter: Payer: Self-pay | Admitting: Physical Therapy

## 2022-01-22 ENCOUNTER — Ambulatory Visit: Payer: BC Managed Care – PPO | Admitting: Physical Therapy

## 2022-01-22 DIAGNOSIS — M6281 Muscle weakness (generalized): Secondary | ICD-10-CM

## 2022-01-22 DIAGNOSIS — M2560 Stiffness of unspecified joint, not elsewhere classified: Secondary | ICD-10-CM

## 2022-01-22 DIAGNOSIS — M79601 Pain in right arm: Secondary | ICD-10-CM

## 2022-01-22 NOTE — Therapy (Signed)
OUTPATIENT PHYSICAL THERAPY TREATMENT NOTE   Patient Name: Amanda Humphrey MRN: [id number] DOB:[birth year], 56 y.o., female Today's Date: 01/22/2022  PCP: Sherlene Shams, MD REFERRING PROVIDER: Sherlene Shams, MD    PT End of Session -     Visit Number 66   Number of Visits 73   Date for PT Re-Evaluation 03/04/22    Authorization - Visit Number 6   Authorization - Number of Visits 10 [phone] to [phone]   (43 min.)   Activity Tolerance Patient tolerated treatment well;Patient limited by pain    Behavior During Therapy [facility] for tasks assessed/performed          Past Medical History:  Diagnosis Date   Allergy    Chronic kidney disease    stones   Fibrocystic breast disease 2013   Meniere disease    Migraines    Past Surgical History:  Procedure Laterality Date   ABDOMINAL HYSTERECTOMY  2011   laprascopic supracervical, [place]   APPENDECTOMY  1999   BREAST BIOPSY Right Nov 2012    fibrocystic disease, [place]   CESAREAN SECTION     4 total   CHOLECYSTECTOMY  2011   TONSILLECTOMY AND ADENOIDECTOMY  1975   Patient Active Problem List   Diagnosis Date Noted   H/O of hemilaminectomy 02/15/2019   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   Fatigue 09/23/2017   History of pneumonia 04/30/2016   Grief reaction 04/30/2016   Vitamin D deficiency 02/22/2015   Hyperlipidemia 01/01/2015   Visit for preventive health examination 12/04/2013   Lower extremity edema 09/02/2013   Essential hypertension 09/02/2013   History of cardiomyopathy 09/02/2013   SVT (supraventricular tachycardia) 09/02/2013   Palpitations 09/02/2013   Contrast media allergy 03/10/2013   Fibrocystic breast disease    Morbid obesity (HCC) 02/05/2012   Meniere disease     REFERRING DIAG:  M79.18 (ICD-10-CM) - Myalgia, other site  Z98.890 (ICD-10-CM) - Other specified postprocedural states  M54.12 (ICD-10-CM) - Radiculopathy, cervical region    THERAPY DIAG:  Joint stiffness of  spine  Pain in right arm  Muscle weakness (generalized)  PERTINENT HISTORY: See evaluation  PRECAUTIONS: N/A  SUBJECTIVE:  (01/22/22)  Pt. Reports no new complaints.  No radicular symptoms but c/o lower cervical discomfort on L/R.     PAIN:  Are you having pain? Yes: NPRS scale: 4-5/10 Pain location: Neck Pain description: aching/ stiff/ tight Aggravating factors: prolonged sitting/ activity Relieving factors: stretches/ PT  12/5: Cervical AROM: L rotn.: 70 deg./ R rotn. 51 deg.  (R cervical tightness/ catching).  L UT feels tight.   Cervical flexion 60 deg./ extension 38 deg.  L lat. Flexion 38 deg./ R lat. Flexion 38 deg.  Seated B shoulder flexion/ abduction 4/5 MMT (increase R UT/ shoulder discomfort).  Grip: L 76.7#/ R 52.1#.  Reassessed R hand/finger manual dexterity.     TODAY'S TREATMENT:   01/22/22:  There.ex.:  4# supine chest press/ tricep extension 20x each. Supine B shoulder horizontal abduction/ adduction 20x.  (No weight).    Reassessment of cervical AROM in sitting before/ after manual tx.   Manual Therapy:    Trigger Point Dry Needling (TDN)   Education performed with patient regarding potential benefit of TDN. Reviewed precautions and risks with patient. Reviewed special precautions/risks over lung fields which include pneumothorax. Reviewed signs and symptoms of pneumothorax and advised pt to go to ER immediately if these symptoms develop advise them of dry needling treatment. Pt provided verbal consent to  treatment. TDN performed to L and R UT/ mid-cervical paraspinals with 5, 0.25 x 40 L-type single needle placements on each side with 2 local twitch response (LTR) in R UT region. Pistoning technique utilized. Improved pain-free motion following intervention.  Good tx. Tolerance. Applied Biofreeze to upper back/ neck after tx.   Supine L/R UT, levator, cervical rotn. Stretches 5x each. Supine cervical grade II-III distraction - 30 s hold x 5.    L/R  shoulder AAROM to end-range flexion/ abduction 5x (slight OP).    Supine and prone STM to L and R upper trap, levator scapulae, posterior deltoid and mid-scapular region/ paraspinals.   Seated L/R thoracic rotn. 2x (no c/o pain) at end of tx.      PATIENT EDUCATION: Education details: HEP Person educated: Patient Education method: Explanation, Facilities manager, and Handouts Education comprehension: verbalized understanding and returned demonstration   HOME EXERCISE PROGRAM: [id number].  HEP: [id number]             PT LONG TERM GOAL 1    Title Pt will improve worse cervical pain by at least 3 NPS points to allow increased tolerance during work and home related ADLs.     Baseline NPS: 8/10 current, 7/10 best, 10/10 Worse. 10/20: Worse pain 7/10.  10/12: 5/10 neck pain at worst (improving)- stress related    Time 8     Period Weeks     Status Partially Met     Target Date 03/04/2022          PT LONG TERM GOAL 2    Title Pt will improve bilat UE MMT by atleast 1/2 grade to facilitate greater functional abilities with ADLs related to caregiving.     Baseline UE MMT R/L:   Shoulder Flexion: 4/4-  Shoulder abduction: 4/4-  Shoulder ER: 4/4  Shoulder IR: 4/4  Elbow ext: 5/5   Eblow flex: 5/5 10/20: UE MMT R/L:   Shoulder Flexion: 4/4  Shoulder abduction: 4/4 Shoulder ER: 4/4  Shoulder IR: 4/4  Elbow ext: 5/5   Elbow flex: 5/5.  10/12: B shoulder flexion 4/5 MMT, abduction 4/5 MMT biceps/triceps 5/5 MMT.     Time 8     Period Weeks     Status Partially Met     Target Date 03/04/2022           PT LONG TERM GOAL 3    Title Pt will improve functional R hand strength equal to L to promote return of hand useage during grooming and feeding.     Baseline Grip R/L: 76.1 / 33.9    Lateral prehension grip R/L: 14 / 4 10/20: Grip R/L: 62.0 / 41.4    Lateral prehension grip R/L: 13 / 6.  1/12:  R 69#/ L 49.8#     Time 8     Period Weeks     Status Partially Met     Target Date 03/04/2022             Plan -     Clinical Impression Statement Pt. Presents with increase cervical/ shoulder AROM in a pain tolerable range.  Pt. Reports muscle knots/ tightness in UT musculature.  No pain with seated thoracic rotn. After tx. And PT reassess cervical AROM.  Tx. focus on manual stretches/ STM to cervical spine/ upper back and HEP to improve to focus on UE strength training.  Decrease noted in overall UT  trigger points and good tolerance to TDN/ STM.  Pain limited with cervical rotn. In  seated posture with hypomobility noted.  Pt. understands current HEP and importance of being aware of proper posture/ body mechanics with work-related tasks and household activities. Pt. Will focus on current UE ex./ cervical stretches to increase pain-free mobility.  Pt. Instructed to contact PT if any questions or concerns.     Personal Factors and Comorbidities Comorbidity 2;Profession;Past/Current Experience    Comorbidities Hypertension, Obesity    Examination-Activity Limitations Squat;Locomotion Level    Examination-Participation Restrictions Occupation;Cleaning;Laundry    Stability/Clinical Decision Making Evolving/Moderate complexity    Clinical Decision Making Moderate    Rehab Potential Good    PT Frequency 1-2x/biweekly   PT Duration 8 weeks    PT Treatment/Interventions ADLs/Self Care Home Management;Aquatic Therapy;Biofeedback;Cryotherapy;Electrical Stimulation;Moist Heat;Ultrasound;Functional mobility training;Therapeutic activities;Therapeutic exercise;Balance training;Neuromuscular re-education;Patient/family education;Manual techniques;Scar mobilization;Passive range of motion;Dry needling;Energy conservation;Taping;Gait training;Stair training;DME Instruction;Spinal Manipulations;Joint Manipulations, Canalith repositioning procedure.    PT Next Visit Plan Issue new HEP   PT Home Exercise Plan [id number].   HEP: [id number]    Consulted and Agree with Plan of Care Patient         Cammie Mcgee, PT, DPT #  [phone] 01/23/2022, 9:21 AM

## 2022-01-30 ENCOUNTER — Ambulatory Visit: Payer: BC Managed Care – PPO | Attending: Internal Medicine | Admitting: Physical Therapy

## 2022-01-30 DIAGNOSIS — M2560 Stiffness of unspecified joint, not elsewhere classified: Secondary | ICD-10-CM | POA: Insufficient documentation

## 2022-01-30 DIAGNOSIS — M6281 Muscle weakness (generalized): Secondary | ICD-10-CM | POA: Diagnosis not present

## 2022-01-30 DIAGNOSIS — M79601 Pain in right arm: Secondary | ICD-10-CM | POA: Insufficient documentation

## 2022-01-30 NOTE — Therapy (Signed)
OUTPATIENT PHYSICAL THERAPY TREATMENT NOTE   Patient Name: Amanda Humphrey MRN: [id number] DOB:1965-09-22, 57 y.o., female Today's Date: 01/30/2022  PCP: Crecencio Mc, MD REFERRING PROVIDER: Crecencio Mc, MD    PT End of Session -     Visit Number 59   Number of Visits 46   Date for PT Re-Evaluation 03/04/22    Authorization - Visit Number 7   Authorization - Number of Visits 10 0820 to 0904   (46 min.)   Activity Tolerance Patient tolerated treatment well;Patient limited by pain    Behavior During Therapy [facility] for tasks assessed/performed          Past Medical History:  Diagnosis Date   Allergy    Chronic kidney disease    stones   Fibrocystic breast disease 2013   Meniere disease    Migraines    Past Surgical History:  Procedure Laterality Date   ABDOMINAL HYSTERECTOMY  2011   laprascopic supracervical, [place]   APPENDECTOMY  1999   BREAST BIOPSY Right Nov 2012    fibrocystic disease, [place]   CESAREAN SECTION     4 total   CHOLECYSTECTOMY  2011   [place]   Patient Active Problem List   Diagnosis Date Noted   H/O of hemilaminectomy 02/15/2019   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[place]) 11/17/2018   Fatigue 09/23/2017   History of pneumonia 04/30/2016   Grief reaction 04/30/2016   Vitamin D deficiency 02/22/2015   Hyperlipidemia 01/01/2015   Visit for preventive health examination 12/04/2013   Lower extremity edema 09/02/2013   Essential hypertension 09/02/2013   History of cardiomyopathy 09/02/2013   SVT (supraventricular tachycardia) 09/02/2013   Palpitations 09/02/2013   Contrast media allergy 03/10/2013   Fibrocystic breast disease    Morbid obesity ([place]) 02/05/2012   Meniere disease     REFERRING DIAG:  M79.18 (ICD-10-CM) - Myalgia, other site  Z98.890 (ICD-10-CM) - Other specified postprocedural states  M54.12 (ICD-10-CM) - Radiculopathy, cervical region    THERAPY DIAG:  Joint stiffness of  spine  Pain in right arm  Muscle weakness (generalized)  PERTINENT HISTORY: See evaluation  PRECAUTIONS: N/A  SUBJECTIVE:  (01/30/22)  Pt. Reports increase in L UE radicular symptoms.  Pt. Has been trying to move L UE/ neck to decrease symptoms.   Pt. Reports 4/10 L UE symptoms today and 3/10 R UE.     PAIN:  Are you having pain? Yes: NPRS scale: 4-5/10 Pain location: Neck Pain description: aching/ stiff/ tight Aggravating factors: prolonged sitting/ activity Relieving factors: stretches/ PT  12/5: Cervical AROM: L rotn.: 70 deg./ R rotn. 51 deg.  (R cervical tightness/ catching).  L UT feels tight.   Cervical flexion 60 deg./ extension 38 deg.  L lat. Flexion 38 deg./ R lat. Flexion 38 deg.  Seated B shoulder flexion/ abduction 4/5 MMT (increase R UT/ shoulder discomfort).  Grip: L 76.7#/ R 52.1#.  Reassessed R hand/finger manual dexterity.     TODAY'S TREATMENT:   01/30/22:  There.ex.:  <5 min.  No charge   Reassessment of cervical AROM in sitting before/ after manual tx.   Manual Therapy:    Trigger Point Dry Needling (TDN)   Education performed with patient regarding potential benefit of TDN. Reviewed precautions and risks with patient. Reviewed special precautions/risks over lung fields which include pneumothorax. Reviewed signs and symptoms of pneumothorax and advised pt to go to ER immediately if these symptoms develop advise them of dry needling treatment. Pt  provided verbal consent to treatment. TDN performed to L and R UT/ mid-cervical paraspinals with 6, 0.3 x 50 L-type single needle placements on each side with 1 local twitch response (LTR) in R UT region. Pistoning technique utilized. Improved pain-free motion following intervention.  Good tx. Tolerance. Applied Biofreeze to upper back/ neck after tx.   Supine L/R UT, levator, cervical rotn. Stretches 5x each. Supine cervical grade II-III distraction - 30 s hold x 5.    L/R shoulder AAROM to end-range flexion/ abduction  5x (slight OP).    Supine L/R ULTT (limited with wrist AROM)- radial/ median/ ulnar nerves glides (gentle)  Supine and prone STM to L and R upper trap, levator scapulae, posterior deltoid and mid-scapular region/ paraspinals.   Seated L/R thoracic rotn. 2x (no c/o pain) at end of tx.      PATIENT EDUCATION: Education details: HEP Person educated: Patient Education method: Explanation, Media planner, and Handouts Education comprehension: verbalized understanding and returned demonstration   HOME EXERCISE PROGRAM: [id number].  HEP: [id number]             PT LONG TERM GOAL 1    Title Pt will improve worse cervical pain by at least 3 NPS points to allow increased tolerance during work and home related ADLs.     Baseline NPS: 8/10 current, 7/10 best, 10/10 Worse. 10/20: Worse pain 7/10.  10/12: 5/10 neck pain at worst (improving)- stress related    Time 8     Period Weeks     Status Partially Met     Target Date 03/04/2022          PT LONG TERM GOAL 2    Title Pt will improve bilat UE MMT by atleast 1/2 grade to facilitate greater functional abilities with ADLs related to caregiving.     Baseline UE MMT R/L:   Shoulder Flexion: 4/4-  Shoulder abduction: 4/4-  Shoulder ER: 4/4  Shoulder IR: 4/4  Elbow ext: 5/5   Eblow flex: 5/5 10/20: UE MMT R/L:   Shoulder Flexion: 4/4  Shoulder abduction: 4/4 Shoulder ER: 4/4  Shoulder IR: 4/4  Elbow ext: 5/5   Elbow flex: 5/5.  10/12: B shoulder flexion 4/5 MMT, abduction 4/5 MMT biceps/triceps 5/5 MMT.     Time 8     Period Weeks     Status Partially Met     Target Date 03/04/2022           PT LONG TERM GOAL 3    Title Pt will improve functional R hand strength equal to L to promote return of hand useage during grooming and feeding.     Baseline Grip R/L: 76.1 / 33.9    Lateral prehension grip R/L: 14 / 4 10/20: Grip R/L: 62.0 / 41.4    Lateral prehension grip R/L: 13 / 6.  1/12:  R 69#/ L 49.8#     Time 8     Period Weeks     Status Partially Met      Target Date 03/04/2022            Plan -     Clinical Impression Statement Pt. Presents with increase cervical tightness/ L UE radicular symptoms.   Pt. Reports muscle knots/ tightness in L>R UT musculature.  No pain with seated thoracic rotn. After tx. And PT reassess cervical AROM.  Tx. focus on manual stretches/ STM to cervical spine/ upper back and HEP to improve to focus on UE strength training.  Decrease noted in  overall UT  trigger points and good tolerance to TDN/ STM.  Pain limited with cervical rotn./ R mid-cervical palation In seated posture with hypomobility noted.  Pt. understands current HEP and importance of being aware of proper posture/ body mechanics with work-related tasks and household activities. Pt. Will focus on current UE ex./ cervical stretches to increase pain-free mobility.  Pt. Instructed to contact PT if any questions or concerns.     Personal Factors and Comorbidities Comorbidity 2;Profession;Past/Current Experience    Comorbidities Hypertension, Obesity    Examination-Activity Limitations Squat;Locomotion Level    Examination-Participation Restrictions Occupation;Cleaning;Laundry    Stability/Clinical Decision Making Evolving/Moderate complexity    Clinical Decision Making Moderate    Rehab Potential Good    PT Frequency 1-2x/biweekly   PT Duration 8 weeks    PT Treatment/Interventions ADLs/Self Care Home Management;Aquatic Therapy;Biofeedback;Cryotherapy;Electrical Stimulation;Moist Heat;Ultrasound;Functional mobility training;Therapeutic activities;Therapeutic exercise;Balance training;Neuromuscular re-education;Patient/family education;Manual techniques;Scar mobilization;Passive range of motion;Dry needling;Energy conservation;Taping;Gait training;Stair training;DME Instruction;Spinal Manipulations;Joint Manipulations, Canalith repositioning procedure.    PT Next Visit Plan Issue new HEP   PT Home Exercise Plan [id number].   HEP: [id number]    Consulted and Agree  with Plan of Care Patient         Pura Spice, PT, DPT # [phone] 01/30/2022, 8:24 AM

## 2022-02-06 ENCOUNTER — Ambulatory Visit: Payer: BC Managed Care – PPO | Admitting: Physical Therapy

## 2022-02-06 DIAGNOSIS — M79601 Pain in right arm: Secondary | ICD-10-CM | POA: Diagnosis not present

## 2022-02-06 DIAGNOSIS — M6281 Muscle weakness (generalized): Secondary | ICD-10-CM

## 2022-02-06 DIAGNOSIS — M2560 Stiffness of unspecified joint, not elsewhere classified: Secondary | ICD-10-CM | POA: Diagnosis not present

## 2022-02-08 NOTE — Therapy (Signed)
OUTPATIENT PHYSICAL THERAPY TREATMENT NOTE   Patient Name: Amanda Humphrey MRN: [id number] DOB:[birth year], 57 y.o., female Today's Date: 02/06/2022  PCP: Sherlene Shams, MD REFERRING PROVIDER: Sherlene Shams, MD    PT End of Session -     Visit Number 68   Number of Visits 73   Date for PT Re-Evaluation 03/04/22    Authorization - Visit Number 8   Authorization - Number of Visits 10 0730 to 0814   (44 min.)   Activity Tolerance Patient tolerated treatment well;Patient limited by pain    Behavior During Therapy [facility] for tasks assessed/performed          Past Medical History:  Diagnosis Date   Allergy    Chronic kidney disease    stones   Fibrocystic breast disease 2013   Meniere disease    Migraines    Past Surgical History:  Procedure Laterality Date   ABDOMINAL HYSTERECTOMY  2011   laprascopic supracervical, [place]   APPENDECTOMY  1999   BREAST BIOPSY Right Nov 2012    fibrocystic disease, [place]   CESAREAN SECTION     4 total   CHOLECYSTECTOMY  2011   TONSILLECTOMY AND ADENOIDECTOMY  1975   Patient Active Problem List   Diagnosis Date Noted   H/O of hemilaminectomy 02/15/2019   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   Fatigue 09/23/2017   History of pneumonia 04/30/2016   Grief reaction 04/30/2016   Vitamin D deficiency 02/22/2015   Hyperlipidemia 01/01/2015   Visit for preventive health examination 12/04/2013   Lower extremity edema 09/02/2013   Essential hypertension 09/02/2013   History of cardiomyopathy 09/02/2013   SVT (supraventricular tachycardia) 09/02/2013   Palpitations 09/02/2013   Contrast media allergy 03/10/2013   Fibrocystic breast disease    Morbid obesity (HCC) 02/05/2012   Meniere disease     REFERRING DIAG:  M79.18 (ICD-10-CM) - Myalgia, other site  Z98.890 (ICD-10-CM) - Other specified postprocedural states  M54.12 (ICD-10-CM) - Radiculopathy, cervical region    THERAPY DIAG:  Joint stiffness of  spine  Pain in right arm  Muscle weakness (generalized)  PERTINENT HISTORY: See evaluation  PRECAUTIONS: N/A  SUBJECTIVE:  (02/06/22)  Pt. Reports doing well this morning.  No new issues.  Pt. Continues to have reports of neck stiffness with rotn on R side.  Good cervical flexion/ extension.     PAIN:  Are you having pain? Yes: NPRS scale: 4-5/10 Pain location: Neck Pain description: aching/ stiff/ tight Aggravating factors: prolonged sitting/ activity Relieving factors: stretches/ PT  12/5: Cervical AROM: L rotn.: 70 deg./ R rotn. 51 deg.  (R cervical tightness/ catching).  L UT feels tight.   Cervical flexion 60 deg./ extension 38 deg.  L lat. Flexion 38 deg./ R lat. Flexion 38 deg.  Seated B shoulder flexion/ abduction 4/5 MMT (increase R UT/ shoulder discomfort).  Grip: L 76.7#/ R 52.1#.  Reassessed R hand/finger manual dexterity.     TODAY'S TREATMENT:   02/06/22:  There.ex.:  <5 min.  No charge   Reassessment of cervical AROM in sitting before/ after manual tx.  Seated thoracic rotn. With OP to L/R 2x each.    Manual Therapy:    Trigger Point Dry Needling (TDN)   Education performed with patient regarding potential benefit of TDN. Reviewed precautions and risks with patient. Reviewed special precautions/risks over lung fields which include pneumothorax. Reviewed signs and symptoms of pneumothorax and advised pt to go to ER immediately if these symptoms develop advise  them of dry needling treatment. Pt provided verbal consent to treatment. TDN performed to L and R UT/ mid-cervical paraspinals with 5, 0.25 x 40 L-type single needle placements on each side with 1 local twitch response (LTR) in R UT region. Pistoning technique utilized. Improved pain-free motion following intervention.  Good tx. Tolerance. Applied Biofreeze to upper back/ neck after tx.   Supine L/R UT, levator, cervical rotn. Stretches 5x each. Supine cervical grade II-III distraction - 30 s hold x 5.    L/R  shoulder AAROM to end-range flexion/ abduction 5x (slight OP).    Supine/ prone STM to B UT/upper thoracic/ cervical paraspinals.       PATIENT EDUCATION: Education details: HEP Person educated: Patient Education method: Explanation, Media planner, and Handouts Education comprehension: verbalized understanding and returned demonstration   HOME EXERCISE PROGRAM: [id number].  HEP: [id number]             PT LONG TERM GOAL 1    Title Pt will improve worse cervical pain by at least 3 NPS points to allow increased tolerance during work and home related ADLs.     Baseline NPS: 8/10 current, 7/10 best, 10/10 Worse. 10/20: Worse pain 7/10.  10/12: 5/10 neck pain at worst (improving)- stress related    Time 8     Period Weeks     Status Partially Met     Target Date 03/04/2022          PT LONG TERM GOAL 2    Title Pt will improve bilat UE MMT by atleast 1/2 grade to facilitate greater functional abilities with ADLs related to caregiving.     Baseline UE MMT R/L:   Shoulder Flexion: 4/4-  Shoulder abduction: 4/4-  Shoulder ER: 4/4  Shoulder IR: 4/4  Elbow ext: 5/5   Eblow flex: 5/5 10/20: UE MMT R/L:   Shoulder Flexion: 4/4  Shoulder abduction: 4/4 Shoulder ER: 4/4  Shoulder IR: 4/4  Elbow ext: 5/5   Elbow flex: 5/5.  10/12: B shoulder flexion 4/5 MMT, abduction 4/5 MMT biceps/triceps 5/5 MMT.     Time 8     Period Weeks     Status Partially Met     Target Date 03/04/2022           PT LONG TERM GOAL 3    Title Pt will improve functional R hand strength equal to L to promote return of hand useage during grooming and feeding.     Baseline Grip R/L: 76.1 / 33.9    Lateral prehension grip R/L: 14 / 4 10/20: Grip R/L: 62.0 / 41.4    Lateral prehension grip R/L: 13 / 6.  1/12:  R 69#/ L 49.8#     Time 8     Period Weeks     Status Partially Met     Target Date 03/04/2022            Plan -     Clinical Impression Statement Pt. Presents with increase cervical tightness/ L UE radicular symptoms.    Pt. Reports muscle knots/ tightness in L>R UT musculature.  No pain with seated thoracic rotn. After tx. And PT reassess cervical AROM.  Tx. focus on manual stretches/ STM to cervical spine/ upper back and HEP to improve to focus on UE strength training.  Decrease noted in overall UT  trigger points and good tolerance to TDN/ STM.  Pain limited with cervical rotn./ R mid-cervical palation In seated posture with hypomobility noted.  Pt. understands current  HEP and importance of being aware of proper posture/ body mechanics with work-related tasks and household activities. Pt. Will focus on current UE ex./ cervical stretches to increase pain-free mobility.  Pt. Instructed to contact PT if any questions or concerns.     Personal Factors and Comorbidities Comorbidity 2;Profession;Past/Current Experience    Comorbidities Hypertension, Obesity    Examination-Activity Limitations Squat;Locomotion Level    Examination-Participation Restrictions Occupation;Cleaning;Laundry    Stability/Clinical Decision Making Evolving/Moderate complexity    Clinical Decision Making Moderate    Rehab Potential Good    PT Frequency 1-2x/biweekly   PT Duration 8 weeks    PT Treatment/Interventions ADLs/Self Care Home Management;Aquatic Therapy;Biofeedback;Cryotherapy;Electrical Stimulation;Moist Heat;Ultrasound;Functional mobility training;Therapeutic activities;Therapeutic exercise;Balance training;Neuromuscular re-education;Patient/family education;Manual techniques;Scar mobilization;Passive range of motion;Dry needling;Energy conservation;Taping;Gait training;Stair training;DME Instruction;Spinal Manipulations;Joint Manipulations, Canalith repositioning procedure.    PT Next Visit Plan Issue new HEP   PT Home Exercise Plan [id number].   HEP: [id number]    Consulted and Agree with Plan of Care Patient         Pura Spice, PT, DPT # [phone] 02/08/2022, 1:09 PM

## 2022-02-13 ENCOUNTER — Ambulatory Visit: Payer: BC Managed Care – PPO | Admitting: Physical Therapy

## 2022-02-13 DIAGNOSIS — M2560 Stiffness of unspecified joint, not elsewhere classified: Secondary | ICD-10-CM

## 2022-02-13 DIAGNOSIS — M79601 Pain in right arm: Secondary | ICD-10-CM

## 2022-02-13 DIAGNOSIS — M6281 Muscle weakness (generalized): Secondary | ICD-10-CM

## 2022-02-17 ENCOUNTER — Encounter: Payer: Self-pay | Admitting: Internal Medicine

## 2022-02-17 ENCOUNTER — Ambulatory Visit
Admission: RE | Admit: 2022-02-17 | Discharge: 2022-02-17 | Disposition: A | Discharge status: Home / Self Care | Payer: BC Managed Care – PPO | Source: Ambulatory Visit | Attending: Emergency Medicine | Admitting: Emergency Medicine

## 2022-02-17 ENCOUNTER — Ambulatory Visit (INDEPENDENT_AMBULATORY_CARE_PROVIDER_SITE_OTHER): Payer: BC Managed Care – PPO

## 2022-02-17 VITALS — BP 154/70 | HR 76 | Temp 98.2°F | Resp 16 | Ht 69.0 in | Wt 240.0 lb

## 2022-02-17 DIAGNOSIS — M109 Gout, unspecified: Secondary | ICD-10-CM | POA: Diagnosis not present

## 2022-02-17 DIAGNOSIS — M7989 Other specified soft tissue disorders: Secondary | ICD-10-CM | POA: Diagnosis not present

## 2022-02-17 DIAGNOSIS — M79671 Pain in right foot: Secondary | ICD-10-CM | POA: Diagnosis not present

## 2022-02-17 MED ORDER — METHYLPREDNISOLONE 4 MG PO TBPK: ORAL_TABLET | ORAL | 0 refills | Status: DC

## 2022-02-17 NOTE — Discharge Instructions (Signed)
Take the Medrol dose pack as directed.  Increase your oral fluid intake to help flush the uric acid from your system.  Keep your foot elevated as much as possible to help with swelling and pain.  Follow-up with your PCP after your symptoms to have a uric acid level drawn and discuss prevention.

## 2022-02-17 NOTE — ED Triage Notes (Signed)
Pt c/o R foot pain x3 days, states joint in big toe swollen & painful. Denies any trauma or injury but fam hx of gout. Has limited ROM in R big toe.

## 2022-02-17 NOTE — ED Provider Notes (Signed)
[facility]    CSN: [id number] Arrival date & time: 02/17/22  1057      History   Chief Complaint Chief Complaint  Patient presents with   Foot Pain    Entered by patient    HPI Amanda Humphrey is a 57 y.o. female.   HPI  57 year old female here for evaluation of right great toe pain.  The patient reports that she has been experiencing pain in the MCP joint of her right great toe for the last 2 days.  She noticed that she had increased swelling, tenderness, redness, warmth, and a throbbing.  She denies any trauma or injury.  She has no numbness or tingling in her toe but she does state that her big toe is tight and difficult to move because of the tightness.  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   Fatigue 09/23/2017   History of pneumonia 04/30/2016   Grief reaction 04/30/2016   Vitamin D deficiency 02/22/2015   Hyperlipidemia 01/01/2015   Visit for preventive health examination 12/04/2013   Lower extremity edema 09/02/2013   Essential hypertension 09/02/2013   History of cardiomyopathy 09/02/2013   SVT (supraventricular tachycardia) 09/02/2013   Palpitations 09/02/2013   Contrast media allergy 03/10/2013   Fibrocystic breast disease    Morbid obesity (HCC) 02/05/2012   Meniere disease     Past Surgical History:  Procedure Laterality Date   ABDOMINAL HYSTERECTOMY  2011   laprascopic supracervical, [place]   APPENDECTOMY  1999   BREAST BIOPSY Right Nov 2012    fibrocystic disease, [place]   CESAREAN SECTION     4 total   CHOLECYSTECTOMY  2011   TONSILLECTOMY AND ADENOIDECTOMY  1975    OB History   No obstetric history on file.      Home Medications    Prior to Admission  medications   Medication Sig Start Date End Date Taking? Authorizing Provider  amphetamine-dextroamphetamine (ADDERALL) 20 MG tablet Take 20 mg by mouth every evening.  09/22/18  Yes [provider]  benzonatate (TESSALON) 100 MG capsule Take 1 capsule (100 mg total) by mouth 3 (three) times daily as needed. 12/02/21  Yes Margaretann Loveless, PA-C  carvedilol (COREG) 6.25 MG tablet Take 1 tablet (6.25 mg total) by mouth 2 (two) times daily. 11/26/21  Yes Gollan, Tollie Pizza, MD  cyclobenzaprine (FLEXERIL) 10 MG tablet Take 10 mg by mouth at bedtime. 05/01/21  Yes [provider]  cyclobenzaprine (FLEXERIL) 5 MG tablet Take 5 mg by mouth at bedtime. 04/28/19  Yes [provider]  doxycycline (VIBRA-TABS) 100 MG tablet Take 1 tablet (100 mg total) by mouth 2 (two) times daily. 12/02/21  Yes Margaretann Loveless, PA-C  ergocalciferol (DRISDOL) 1.25 MG (50000 UT) capsule Take 1 capsule (50,000 Units total) by mouth once a week. 03/21/21  Yes Sherlene Shams, MD  Evolocumab (REPATHA) 140 MG/ML SOSY Inject 1 Dose into the skin every 14 (fourteen) days. 11/26/21  Yes Gollan, Tollie Pizza, MD  ezetimibe (ZETIA) 10 MG tablet Take 1 tablet (10 mg total) by mouth daily. 08/20/20  Yes Antonieta Iba, MD  furosemide (LASIX) 20 MG tablet Take 1 tablet (20 mg  total) by mouth daily as needed. 11/26/21  Yes Gollan, Kathlene November, MD  hydrochlorothiazide (HYDRODIURIL) 25 MG tablet Take 1 tablet (25 mg total) by mouth daily. 11/26/21  Yes Gollan, Kathlene November, MD  methocarbamol (ROBAXIN) 500 MG tablet TAKE 1 TABLET TWICE A DAY 03/07/19  Yes Crecencio Mc, MD  methocarbamol (ROBAXIN) 750 MG tablet Take 750 mg by mouth in the morning and at bedtime. 05/01/21  Yes [provider]  methylPREDNISolone (MEDROL DOSEPAK) 4 MG TBPK tablet Take according to the package insert. 02/17/22  Yes Margarette Canada, NP  omeprazole (PRILOSEC) 40 MG capsule TAKE 1 CAPSULE DAILY 08/12/21  Yes Crecencio Mc, MD    Family  History Family History  Problem Relation Age of Onset   Hypertension Mother    Cancer Mother        ovarian   Arthritis Mother    Hyperlipidemia Father    Hypertension Father    Diabetes Father    Stroke Father    Alzheimer's disease Maternal Grandmother    Heart disease Maternal Grandmother    Breast cancer Maternal Grandmother    Alzheimer's disease Paternal Grandmother    Breast cancer Paternal Grandmother     Social History Social History   Tobacco Use   Smoking status: Never   Smokeless tobacco: Never  Vaping Use   Vaping Use: Never used  Substance Use Topics   Alcohol use: Yes    Comment: social   Drug use: No     Allergies   Bee venom, Codeine, Hydrocodone, Tramadol, and Oxycodone   Review of Systems Review of Systems  Constitutional:  Negative for fever.  Musculoskeletal:  Positive for arthralgias and joint swelling.  Skin:  Positive for color change.  Neurological:  Negative for weakness and numbness.  Hematological: Negative.   Psychiatric/Behavioral: Negative.       Physical Exam Triage Vital Signs ED Triage Vitals [02/17/22 1121]  Enc Vitals Group     BP      Pulse      Resp      Temp      Temp src      SpO2      Weight      Height      Head Circumference      Peak Flow      Pain Score 8     Pain Loc      Pain Edu?      Excl. in [place]?    No data found.  Updated Vital Signs BP (!) 154/70 (BP Location: Left Arm)   Pulse 76   Temp 98.2 F (36.8 C) (Oral)   Resp 16   Ht 5\' 9"  (1.753 m)   Wt 240 lb (108.9 kg)   SpO2 99%   BMI 35.44 kg/m   Visual Acuity Right Eye Distance:   Left Eye Distance:   Bilateral Distance:    Right Eye Near:   Left Eye Near:    Bilateral Near:     Physical Exam Vitals and nursing note reviewed.  Constitutional:      Appearance: Normal appearance. She is not ill-appearing.  HENT:     Head: Normocephalic and atraumatic.  Musculoskeletal:        General: Swelling and tenderness present. No  deformity or signs of injury.  Skin:    General: Skin is warm and dry.     Capillary Refill: Capillary refill takes less than 2 seconds.     Findings: Erythema present.  Neurological:     General: No focal deficit present.     Mental Status: She is alert and oriented to person, place, and time.  Psychiatric:        Mood and Affect: Mood normal.        Behavior: Behavior normal.        Thought Content: Thought content normal.        Judgment: Judgment normal.      UC Treatments / Results  Labs (all labs ordered are listed, but only abnormal results are displayed) Labs Reviewed - No data to display  EKG   Radiology DG Foot Complete Right  Result Date: 02/17/2022 CLINICAL DATA:  Right foot pain for 3 days. Great toe joint swollen and painful. History of gout. No known trauma. No new range of motion right great toe. EXAM: RIGHT FOOT COMPLETE - 3+ VIEW COMPARISON:  Right ankle radiographs 07/25/2020, right foot radiographs 11/01/2017 FINDINGS: Minimal lateral great toe metatarsophalangeal joint degenerative spurring. Mild soft tissue swelling medial to the great toe metatarsophalangeal joint appears increased from 11/01/2017. No peripheral great toe metatarsophalangeal joint erosions are seen to indicate radiographic evidence of gout. Mild-to-moderate plantar calcaneal heel spur is slightly increased from 11/01/2017. Minimal chronic enthesopathic change at the Achilles insertion on the calcaneus. There are moderate subchondral cystic changes again seen within the distal tibia just deep to the medial tibial plafond articular surface. No acute fracture or dislocation. IMPRESSION: Mild soft tissue swelling medial to the great toe metatarsophalangeal joint appears increased from 11/01/2017. Minimal great toe metatarsophalangeal joint osteoarthritis. Otherwise, no peripheral erosions are seen to indicate radiographic evidence of gout. Electronically Signed   By: Yvonne Kendall M.D.   On: 02/17/2022  11:44    Procedures Procedures (including critical care time)  Medications Ordered in UC Medications - No data to display  Initial Impression / Assessment and Plan / UC Course  I have reviewed the triage vital signs and the nursing notes.  Pertinent labs & imaging results that were available during my care of the patient were reviewed by me and considered in my medical decision making (see chart for details).   Patient is a pleasant, nontoxic-appearing 57 year old female here for evaluation of redness and swelling to the MCP joint of her right great toe as outlined in HPI above.  As you can see from the image above there is redness and swelling to the superior and lateral aspects of the MCP joint of the right great toe.  She has no tenderness or swelling distal and her cap refill is less than 2 seconds.  She has good sensation as well.  She does not have a personal history of gout but she has a strong family history in both her grandfather and mother for gout.  She said that prior to the outbreak she had been eating a lot of spinach.  She does take hydrochlorothiazide as well for her high blood pressure.  Radiograph was collected at triage and shows mild soft tissue swelling to the medial great toe along with metatarsophalangeal joint osteoarthritis.  This could be an arthritis flare though it is more clinically reminiscent of gout.  Given her strong family history of gout I will treat her for gout with a Medrol Dosepak.  I also given her dietary guidelines to follow to see if she can prevent further symptoms.  She does have an appointment coming up with her PCP the first part of February and I have advised her that she should discuss  her outbreak with her PCP and possibly have a uric acid level drawn to see if she is developing gout.  Work note provided.  Return precautions reviewed.  Final Clinical Impressions(s) / UC Diagnoses   Final diagnoses:  Acute gout involving toe of right foot,  unspecified cause     Discharge Instructions      Take the Medrol dose pack as directed.  Increase your oral fluid intake to help flush the uric acid from your system.  Keep your foot elevated as much as possible to help with swelling and pain.  Follow-up with your PCP after your symptoms to have a uric acid level drawn and discuss prevention.     ED Prescriptions     Medication Sig Dispense Auth. Provider   methylPREDNISolone (MEDROL DOSEPAK) 4 MG TBPK tablet Take according to the package insert. 1 each Margarette Canada, NP      PDMP not reviewed this encounter.   Margarette Canada, NP 02/17/22 1153

## 2022-02-19 MED ORDER — INDOMETHACIN ER 75 MG PO CPCR: 75.0000 mg | ORAL_CAPSULE | Freq: Every day | ORAL | 2 refills | Status: DC

## 2022-02-20 ENCOUNTER — Ambulatory Visit: Payer: BC Managed Care – PPO | Admitting: Physical Therapy

## 2022-02-20 DIAGNOSIS — M79601 Pain in right arm: Secondary | ICD-10-CM

## 2022-02-20 DIAGNOSIS — M6281 Muscle weakness (generalized): Secondary | ICD-10-CM | POA: Diagnosis not present

## 2022-02-20 DIAGNOSIS — M2560 Stiffness of unspecified joint, not elsewhere classified: Secondary | ICD-10-CM

## 2022-02-21 NOTE — Therapy (Signed)
OUTPATIENT PHYSICAL THERAPY TREATMENT NOTE  Patient Name: Amanda Humphrey MRN: [id number] DOB:1965-02-15, 57 y.o., female Today's Date: 02/13/2022  PCP: Sherlene Shams, MD REFERRING PROVIDER: Sherlene Shams, MD    PT End of Session - 02/21/22 1825     Visit Number 69    Number of Visits 73    Date for PT Re-Evaluation 03/04/22    PT Start Time 0811    PT Stop Time 0902    PT Time Calculation (min) 51 min    Activity Tolerance Patient tolerated treatment well    Behavior During Therapy [facility] for tasks assessed/performed             Past Medical History:  Diagnosis Date   Allergy    Chronic kidney disease    stones   Fibrocystic breast disease 2013   Meniere disease    Migraines    Past Surgical History:  Procedure Laterality Date   ABDOMINAL HYSTERECTOMY  2011   laprascopic supracervical, [place]   APPENDECTOMY  1999   BREAST BIOPSY Right Nov 2012    fibrocystic disease, [place]   CESAREAN SECTION     4 total   CHOLECYSTECTOMY  2011   TONSILLECTOMY AND ADENOIDECTOMY  1975   Patient Active Problem List   Diagnosis Date Noted   H/O of hemilaminectomy 02/15/2019   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   Fatigue 09/23/2017   History of pneumonia 04/30/2016   Grief reaction 04/30/2016   Vitamin D deficiency 02/22/2015   Hyperlipidemia 01/01/2015   Visit for preventive health examination 12/04/2013   Lower extremity edema 09/02/2013   Essential hypertension 09/02/2013   History of cardiomyopathy 09/02/2013   SVT (supraventricular tachycardia) 09/02/2013   Palpitations 09/02/2013   Contrast media allergy 03/10/2013   Fibrocystic breast disease    Morbid obesity (HCC) 02/05/2012   Meniere disease     REFERRING DIAG:  M79.18 (ICD-10-CM) - Myalgia, other site  Z98.890 (ICD-10-CM) - Other specified postprocedural states  M54.12 (ICD-10-CM) - Radiculopathy, cervical region    THERAPY DIAG:  Joint stiffness of spine  Pain in right  arm  Muscle weakness (generalized)  PERTINENT HISTORY: See evaluation  PRECAUTIONS: N/A  SUBJECTIVE:  (02/13/22)  Pt. Reports stress at work.  Pt. Had a busy day yesterday and got home late.  Pt. Reports tension/ discomfort in R cervical spine today.     PAIN:  Are you having pain? Yes: NPRS scale: 4-5/10 Pain location: Neck Pain description: aching/ stiff/ tight Aggravating factors: prolonged sitting/ activity Relieving factors: stretches/ PT  12/5: Cervical AROM: L rotn.: 70 deg./ R rotn. 51 deg.  (R cervical tightness/ catching).  L UT feels tight.   Cervical flexion 60 deg./ extension 38 deg.  L lat. Flexion 38 deg./ R lat. Flexion 38 deg.  Seated B shoulder flexion/ abduction 4/5 MMT (increase R UT/ shoulder discomfort).  Grip: L 76.7#/ R 52.1#.  Reassessed R hand/finger manual dexterity.     TODAY'S TREATMENT:   02/13/22:  There.ex.:  <5 min.  No charge   Reassessment of cervical AROM in sitting before/ after manual tx.  Seated thoracic rotn. After tx. With OP to L/R 2x each.    Manual Therapy:    Supine L/R UT, levator, cervical rotn. Stretches 5x each. Supine cervical grade II-III distraction - 30 s hold x 5.    L/R shoulder AAROM to end-range flexion/ abduction 5x (slight OP).    Trigger Point Dry Needling (TDN)   Education performed with patient  regarding potential benefit of TDN. Reviewed precautions and risks with patient. Reviewed special precautions/risks over lung fields which include pneumothorax. Reviewed signs and symptoms of pneumothorax and advised pt to go to ER immediately if these symptoms develop advise them of dry needling treatment. Pt provided verbal consent to treatment. TDN performed to L and R UT/ mid-cervical paraspinals with 4, 0.25 x 40 L-type single needle placements on each side with 2 local twitch response (LTR) in R UT region. Pistoning technique utilized. Improved pain-free motion following intervention.  Good tx. Tolerance. Applied Biofreeze to  upper back/ neck after tx.   Supine/ prone STM to B UT/upper thoracic/ cervical paraspinals.       PATIENT EDUCATION: Education details: HEP Person educated: Patient Education method: Explanation, Media planner, and Handouts Education comprehension: verbalized understanding and returned demonstration   HOME EXERCISE PROGRAM: [id number].  HEP: [id number]             PT LONG TERM GOAL 1    Title Pt will improve worse cervical pain by at least 3 NPS points to allow increased tolerance during work and home related ADLs.     Baseline NPS: 8/10 current, 7/10 best, 10/10 Worse. 10/20: Worse pain 7/10.  10/12: 5/10 neck pain at worst (improving)- stress related    Time 8     Period Weeks     Status Partially Met     Target Date 03/04/2022          PT LONG TERM GOAL 2    Title Pt will improve bilat UE MMT by atleast 1/2 grade to facilitate greater functional abilities with ADLs related to caregiving.     Baseline UE MMT R/L:   Shoulder Flexion: 4/4-  Shoulder abduction: 4/4-  Shoulder ER: 4/4  Shoulder IR: 4/4  Elbow ext: 5/5   Eblow flex: 5/5 10/20: UE MMT R/L:   Shoulder Flexion: 4/4  Shoulder abduction: 4/4 Shoulder ER: 4/4  Shoulder IR: 4/4  Elbow ext: 5/5   Elbow flex: 5/5.  10/12: B shoulder flexion 4/5 MMT, abduction 4/5 MMT biceps/triceps 5/5 MMT.     Time 8     Period Weeks     Status Partially Met     Target Date 03/04/2022           PT LONG TERM GOAL 3    Title Pt will improve functional R hand strength equal to L to promote return of hand useage during grooming and feeding.     Baseline Grip R/L: 76.1 / 33.9    Lateral prehension grip R/L: 14 / 4 10/20: Grip R/L: 62.0 / 41.4    Lateral prehension grip R/L: 13 / 6.  1/12:  R 69#/ L 49.8#     Time 8     Period Weeks     Status Partially Met     Target Date 03/04/2022            Plan -     Clinical Impression Statement Pt. Presents with increase cervical tightness/ L UE radicular symptoms.   Pt. Reports muscle knots/ tightness  in L>R UT musculature.  No pain with seated thoracic rotn. After tx. And PT reassess cervical AROM.  Tx. focus on manual stretches/ STM to cervical spine/ upper back and HEP to improve to focus on UE strength training.  Decrease noted in overall UT  trigger points and good tolerance to TDN/ STM.  Pain limited with cervical rotn./ R mid-cervical palation In seated posture with hypomobility noted.  Pt. understands current HEP and importance of being aware of proper posture/ body mechanics with work-related tasks and household activities. Pt. Will focus on current UE ex./ cervical stretches to increase pain-free mobility.  Pt. Instructed to contact PT if any questions or concerns.     Personal Factors and Comorbidities Comorbidity 2;Profession;Past/Current Experience    Comorbidities Hypertension, Obesity    Examination-Activity Limitations Squat;Locomotion Level    Examination-Participation Restrictions Occupation;Cleaning;Laundry    Stability/Clinical Decision Making Evolving/Moderate complexity    Clinical Decision Making Moderate    Rehab Potential Good    PT Frequency 1-2x/biweekly   PT Duration 8 weeks    PT Treatment/Interventions ADLs/Self Care Home Management;Aquatic Therapy;Biofeedback;Cryotherapy;Electrical Stimulation;Moist Heat;Ultrasound;Functional mobility training;Therapeutic activities;Therapeutic exercise;Balance training;Neuromuscular re-education;Patient/family education;Manual techniques;Scar mobilization;Passive range of motion;Dry needling;Energy conservation;Taping;Gait training;Stair training;DME Instruction;Spinal Manipulations;Joint Manipulations, Canalith repositioning procedure.    PT Next Visit Plan 10th visit progress note next tx.     PT Home Exercise Plan [id number].   HEP: [id number]    Consulted and Agree with Plan of Care Patient         Pura Spice, PT, DPT # [phone] 02/13/2022, 1:09 PM

## 2022-02-22 NOTE — Therapy (Signed)
OUTPATIENT PHYSICAL THERAPY TREATMENT NOTE Physical Therapy Progress Note  Dates of reporting period  12/18/21  to  02/20/22  Patient Name: Amanda Humphrey MRN: [id number] DOB:17-Jan-1966, 57 y.o., female Today's Date: 02/20/2022  PCP: Crecencio Mc, MD REFERRING PROVIDER: Crecencio Mc, MD    PT End of Session - 02/22/22 2000     Visit Number 7    Number of Visits 3    Date for PT Re-Evaluation 03/04/22    PT Start Time 0732    PT Stop Time 0815    PT Time Calculation (min) 43 min    Activity Tolerance Patient tolerated treatment well    Behavior During Therapy [facility] for tasks assessed/performed             Past Medical History:  Diagnosis Date   Allergy    Chronic kidney disease    stones   Fibrocystic breast disease 2013   Meniere disease    Migraines    Past Surgical History:  Procedure Laterality Date   ABDOMINAL HYSTERECTOMY  2011   laprascopic supracervical, [place]   APPENDECTOMY  1999   BREAST BIOPSY Right Nov 2012    fibrocystic disease, [place]   CESAREAN SECTION     4 total   CHOLECYSTECTOMY  2011   [place]   Patient Active Problem List   Diagnosis Date Noted   H/O of hemilaminectomy 02/15/2019   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[place]) 11/17/2018   Fatigue 09/23/2017   History of pneumonia 04/30/2016   Grief reaction 04/30/2016   Vitamin D deficiency 02/22/2015   Hyperlipidemia 01/01/2015   Visit for preventive health examination 12/04/2013   Lower extremity edema 09/02/2013   Essential hypertension 09/02/2013   History of cardiomyopathy 09/02/2013   SVT (supraventricular tachycardia) 09/02/2013   Palpitations 09/02/2013   Contrast media allergy 03/10/2013   Fibrocystic breast disease    Morbid obesity ([place]) 02/05/2012   Meniere disease     REFERRING DIAG:  M79.18 (ICD-10-CM) - Myalgia, other site  Z98.890 (ICD-10-CM) - Other specified postprocedural states  M54.12 (ICD-10-CM) - Radiculopathy,  cervical region    THERAPY DIAG:  Joint stiffness of spine  Pain in right arm  Muscle weakness (generalized)  PERTINENT HISTORY: See evaluation  PRECAUTIONS: N/A  SUBJECTIVE:  (02/20/22)   Pt. Reports recent visit to Urgent Care due to gout in toe.  Pt. Reports meds are helping symptoms/ pain.  Pt. Able to wear shoe today.     PAIN:  Are you having pain? Yes: NPRS scale: 4/10 Pain location: Neck Pain description: aching/ stiff/ tight Aggravating factors: prolonged sitting/ activity Relieving factors: stretches/ PT  12/5: Cervical AROM: L rotn.: 70 deg./ R rotn. 51 deg.  (R cervical tightness/ catching).  L UT feels tight.   Cervical flexion 60 deg./ extension 38 deg.  L lat. Flexion 38 deg./ R lat. Flexion 38 deg.  Seated B shoulder flexion/ abduction 4/5 MMT (increase R UT/ shoulder discomfort).  Grip: L 76.7#/ R 52.1#.  Reassessed R hand/finger manual dexterity.     TODAY'S TREATMENT:   02/13/22:  There.ex.:  <5 min.  No charge   Reassessment of cervical AROM in sitting before/ after manual tx.  Seated thoracic rotn. After tx. With OP to L/R 2x each.    Manual Therapy:    Supine L/R UT, levator, cervical rotn. Stretches 5x each. Supine cervical grade II-III distraction - 30 s hold x 5.    L/R shoulder AAROM to end-range flexion/ horizontal  abduction 5x (slight OP).    Trigger Point Dry Needling (TDN)   Education performed with patient regarding potential benefit of TDN. Reviewed precautions and risks with patient. Reviewed special precautions/risks over lung fields which include pneumothorax. Reviewed signs and symptoms of pneumothorax and advised pt to go to ER immediately if these symptoms develop advise them of dry needling treatment. Pt provided verbal consent to treatment. TDN performed to L and R UT/ mid-cervical paraspinals with 4, 0.25 x 40 L-type single needle placements on each side with 2 local twitch response (LTR) in R UT region. Pistoning technique utilized.  Improved pain-free motion following intervention.  Good tx. Tolerance. Applied Biofreeze to upper back/ neck after tx.   Supine/ prone STM to B UT/upper thoracic/ cervical paraspinals.       PATIENT EDUCATION: Education details: HEP Person educated: Patient Education method: Explanation, Media planner, and Handouts Education comprehension: verbalized understanding and returned demonstration   HOME EXERCISE PROGRAM: [id number].  HEP: [id number]             PT LONG TERM GOAL 1    Title Pt will improve worse cervical pain by at least 3 NPS points to allow increased tolerance during work and home related ADLs.     Baseline NPS: 8/10 current, 7/10 best, 10/10 Worse. 10/20: Worse pain 7/10.  10/12: 5/10 neck pain at worst (improving)- stress related    Time 8     Period Weeks     Status Partially Met     Target Date 03/04/2022          PT LONG TERM GOAL 2    Title Pt will improve bilat UE MMT by atleast 1/2 grade to facilitate greater functional abilities with ADLs related to caregiving.     Baseline UE MMT R/L:   Shoulder Flexion: 4/4-  Shoulder abduction: 4/4-  Shoulder ER: 4/4  Shoulder IR: 4/4  Elbow ext: 5/5   Eblow flex: 5/5 10/20: UE MMT R/L:   Shoulder Flexion: 4/4  Shoulder abduction: 4/4 Shoulder ER: 4/4  Shoulder IR: 4/4  Elbow ext: 5/5   Elbow flex: 5/5.  10/12: B shoulder flexion 4/5 MMT, abduction 4/5 MMT biceps/triceps 5/5 MMT.     Time 8     Period Weeks     Status Partially Met     Target Date 03/04/2022           PT LONG TERM GOAL 3    Title Pt will improve functional R hand strength equal to L to promote return of hand useage during grooming and feeding.     Baseline Grip R/L: 76.1 / 33.9    Lateral prehension grip R/L: 14 / 4 10/20: Grip R/L: 62.0 / 41.4    Lateral prehension grip R/L: 13 / 6.  1/12:  R 69#/ L 49.8#     Time 8     Period Weeks     Status Partially Met     Target Date 03/04/2022            Plan -     Clinical Impression Statement Pt. Reports muscle  tightness in L>R UT and cervical musculature.  No pain with seated thoracic rotn. After tx. And PT reassess cervical AROM.  Tx. focus on manual stretches/ STM to cervical spine/ upper back and HEP to improve to focus on UE strength training.  Decrease noted in overall UT  trigger points and good tolerance to TDN/ STM.  Pt. Has full cervical rotn. To L/R.  Pt. understands current HEP and importance of being aware of proper posture/ body mechanics with work-related tasks and household activities. Pt. Will focus on current UE ex./ cervical stretches to increase pain-free mobility.  Pt. Instructed to contact PT if any questions or concerns.     Personal Factors and Comorbidities Comorbidity 2;Profession;Past/Current Experience    Comorbidities Hypertension, Obesity    Examination-Activity Limitations Squat;Locomotion Level    Examination-Participation Restrictions Occupation;Cleaning;Laundry    Stability/Clinical Decision Making Evolving/Moderate complexity    Clinical Decision Making Moderate    Rehab Potential Good    PT Frequency 1-2x/biweekly   PT Duration 8 weeks    PT Treatment/Interventions ADLs/Self Care Home Management;Aquatic Therapy;Biofeedback;Cryotherapy;Electrical Stimulation;Moist Heat;Ultrasound;Functional mobility training;Therapeutic activities;Therapeutic exercise;Balance training;Neuromuscular re-education;Patient/family education;Manual techniques;Scar mobilization;Passive range of motion;Dry needling;Energy conservation;Taping;Gait training;Stair training;DME Instruction;Spinal Manipulations;Joint Manipulations, Canalith repositioning procedure.    PT Next Visit Plan Check goals/ discuss POC.     PT Home Exercise Plan [id number].   HEP: [id number]    Consulted and Agree with Plan of Care Patient         Cammie Mcgee, PT, DPT # [phone] 02/20/2022, 8:02 PM

## 2022-02-27 ENCOUNTER — Ambulatory Visit: Payer: BC Managed Care – PPO | Admitting: Physical Therapy

## 2022-03-06 ENCOUNTER — Encounter: Payer: Self-pay | Admitting: Physical Therapy

## 2022-03-06 ENCOUNTER — Ambulatory Visit: Payer: BC Managed Care – PPO | Attending: Internal Medicine | Admitting: Physical Therapy

## 2022-03-06 DIAGNOSIS — M2560 Stiffness of unspecified joint, not elsewhere classified: Secondary | ICD-10-CM | POA: Diagnosis not present

## 2022-03-06 DIAGNOSIS — M6281 Muscle weakness (generalized): Secondary | ICD-10-CM | POA: Insufficient documentation

## 2022-03-06 DIAGNOSIS — M79601 Pain in right arm: Secondary | ICD-10-CM | POA: Diagnosis not present

## 2022-03-08 NOTE — Therapy (Addendum)
OUTPATIENT PHYSICAL THERAPY TREATMENT NOTE  Patient Name: Amanda Humphrey MRN: [id number] DOB:November 16, 1965, 57 y.o., female Today's Date: 03/06/2022  PCP: Crecencio Mc, MD REFERRING PROVIDER: Crecencio Mc, MD  PT End of Session -        Visit Number 52     Number of Visits 66     Date for PT Re-Evaluation 05/01/22     PT Start Time 0732     PT Stop Time 0816     PT Time Calculation (min) 44 min     Activity Tolerance Patient tolerated treatment well     Behavior During Therapy [facility] for tasks assessed/performed       Past Medical History:  Diagnosis Date   Allergy    Chronic kidney disease    stones   Fibrocystic breast disease 2013   Meniere disease    Migraines    Past Surgical History:  Procedure Laterality Date   ABDOMINAL HYSTERECTOMY  2011   laprascopic supracervical, [place]   APPENDECTOMY  1999   BREAST BIOPSY Right Nov 2012    fibrocystic disease, [place]   CESAREAN SECTION     4 total   CHOLECYSTECTOMY  2011   [place]   Patient Active Problem List   Diagnosis Date Noted   H/O of hemilaminectomy 02/15/2019   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[place]) 11/17/2018   Fatigue 09/23/2017   History of pneumonia 04/30/2016   Grief reaction 04/30/2016   Vitamin D deficiency 02/22/2015   Hyperlipidemia 01/01/2015   Visit for preventive health examination 12/04/2013   Lower extremity edema 09/02/2013   Essential hypertension 09/02/2013   History of cardiomyopathy 09/02/2013   SVT (supraventricular tachycardia) 09/02/2013   Palpitations 09/02/2013   Contrast media allergy 03/10/2013   Fibrocystic breast disease    Morbid obesity ([place]) 02/05/2012   Meniere disease     REFERRING DIAG:  M79.18 (ICD-10-CM) - Myalgia, other site  Z98.890 (ICD-10-CM) - Other specified postprocedural states  M54.12 (ICD-10-CM) - Radiculopathy, cervical region    THERAPY DIAG:  Joint stiffness of spine  Pain in right arm  Muscle weakness  (generalized)  PERTINENT HISTORY: See evaluation  PRECAUTIONS: N/A  SUBJECTIVE:  (03/06/22)   Pt. Doing well today.  No new complaints.  Pt. Continues to have UT/ cervical paraspinal tightness.      PAIN:  Are you having pain? Yes: NPRS scale: 4/10 Pain location: Neck Pain description: aching/ stiff/ tight Aggravating factors: prolonged sitting/ activity Relieving factors: stretches/ PT  12/5: Cervical AROM: L rotn.: 70 deg./ R rotn. 51 deg.  (R cervical tightness/ catching).  L UT feels tight.   Cervical flexion 60 deg./ extension 38 deg.  L lat. Flexion 38 deg./ R lat. Flexion 38 deg.  Seated B shoulder flexion/ abduction 4/5 MMT (increase R UT/ shoulder discomfort).  Grip: L 76.7#/ R 52.1#.  Reassessed R hand/finger manual dexterity.     TODAY'S TREATMENT:   03/06/22:  There.ex.:  <5 min.  No charge   Reassessment of cervical AROM in sitting before/ after manual tx.  Seated thoracic rotn. After tx. With OP to L/R 2x each.    Manual Therapy:    Supine L/R UT, levator, cervical rotn. Stretches 5x each. Supine cervical grade II-III distraction - 30 s hold x 5.    L/R shoulder AAROM to end-range flexion/ horizontal abduction 5x (slight OP).    Trigger Point Dry Needling (TDN)   Education performed with patient regarding potential benefit of TDN. Reviewed  precautions and risks with patient. Reviewed special precautions/risks over lung fields which include pneumothorax. Reviewed signs and symptoms of pneumothorax and advised pt to go to ER immediately if these symptoms develop advise them of dry needling treatment. Pt provided verbal consent to treatment. TDN performed to L and R UT/ mid-cervical paraspinals with 4, 0.25 x 40 L-type single needle placements on each side with 2 local twitch response (LTR) in R UT region. Pistoning technique utilized. Improved pain-free motion following intervention.  Good tx. Tolerance. Applied Biofreeze to upper back/ neck after tx.   Supine/ prone STM to  B UT/upper thoracic/ cervical paraspinals.       PATIENT EDUCATION: Education details: HEP Person educated: Patient Education method: Explanation, Media planner, and Handouts Education comprehension: verbalized understanding and returned demonstration   HOME EXERCISE PROGRAM: [id number].  HEP: [id number]             PT LONG TERM GOAL 1    Title Pt will improve worse cervical pain by at least 3 NPS points to allow increased tolerance during work and home related ADLs.     Baseline NPS: 8/10 current, 7/10 best, 10/10 Worse. 10/20: Worse pain 7/10.  10/12: 5/10 neck pain at worst (improving)- stress related    Time 8     Period Weeks     Status Partially Met     Target Date 05/01/2022          PT LONG TERM GOAL 2    Title Pt will improve bilat UE MMT by atleast 1/2 grade to facilitate greater functional abilities with ADLs related to caregiving.     Baseline UE MMT R/L:   Shoulder Flexion: 4/4-  Shoulder abduction: 4/4-  Shoulder ER: 4/4  Shoulder IR: 4/4  Elbow ext: 5/5   Eblow flex: 5/5 10/20: UE MMT R/L:   Shoulder Flexion: 4/4  Shoulder abduction: 4/4 Shoulder ER: 4/4  Shoulder IR: 4/4  Elbow ext: 5/5   Elbow flex: 5/5.  10/12: B shoulder flexion 4/5 MMT, abduction 4/5 MMT biceps/triceps 5/5 MMT.     Time 8     Period Weeks     Status Partially Met     Target Date 05/01/2022           PT LONG TERM GOAL 3    Title Pt will improve functional R hand strength equal to L to promote return of hand useage during grooming and feeding.     Baseline Grip R/L: 76.1 / 33.9    Lateral prehension grip R/L: 14 / 4 10/20: Grip R/L: 62.0 / 41.4    Lateral prehension grip R/L: 13 / 6.  1/12:  R 69#/ L 49.8#     Time 8     Period Weeks     Status Partially Met     Target Date 05/01/2022            Plan -     Clinical Impression Statement Pt. Reports muscle tightness in L>R UT and cervical musculature.  No pain with seated thoracic rotn. After tx. And PT reassess cervical AROM.  Tx. focus on manual  stretches/ STM to cervical spine/ upper back and HEP to improve to focus on UE strength training.  Decrease noted in overall UT  trigger points and good tolerance to TDN/ STM.  Pt. Has full cervical rotn. To L/R.  Pt. understands current HEP and importance of being aware of proper posture/ body mechanics with work-related tasks and household activities. Pt. Will focus on  current UE ex./ cervical stretches to increase pain-free mobility.  Pt. Instructed to contact PT if any questions or concerns.     Personal Factors and Comorbidities Comorbidity 2;Profession;Past/Current Experience    Comorbidities Hypertension, Obesity    Examination-Activity Limitations Squat;Locomotion Level    Examination-Participation Restrictions Occupation;Cleaning;Laundry    Stability/Clinical Decision Making Evolving/Moderate complexity    Clinical Decision Making Moderate    Rehab Potential Good    PT Frequency 1-2x/biweekly   PT Duration 8 weeks    PT Treatment/Interventions ADLs/Self Care Home Management;Aquatic Therapy;Biofeedback;Cryotherapy;Electrical Stimulation;Moist Heat;Ultrasound;Functional mobility training;Therapeutic activities;Therapeutic exercise;Balance training;Neuromuscular re-education;Patient/family education;Manual techniques;Scar mobilization;Passive range of motion;Dry needling;Energy conservation;Taping;Gait training;Stair training;DME Instruction;Spinal Manipulations;Joint Manipulations, Canalith repositioning procedure.    PT Next Visit Plan Check goals/ discuss POC.     PT Home Exercise Plan [id number].   HEP: [id number]    Consulted and Agree with Plan of Care Patient         Pura Spice, PT, DPT # [phone] 03/08/2022, 1:45 PM

## 2022-03-13 ENCOUNTER — Ambulatory Visit: Payer: BC Managed Care – PPO | Admitting: Physical Therapy

## 2022-03-13 DIAGNOSIS — M6281 Muscle weakness (generalized): Secondary | ICD-10-CM

## 2022-03-13 DIAGNOSIS — M2560 Stiffness of unspecified joint, not elsewhere classified: Secondary | ICD-10-CM | POA: Diagnosis not present

## 2022-03-13 DIAGNOSIS — M79601 Pain in right arm: Secondary | ICD-10-CM | POA: Diagnosis not present

## 2022-03-17 NOTE — Therapy (Signed)
OUTPATIENT PHYSICAL THERAPY TREATMENT NOTE   Patient Name: Amanda Humphrey MRN: [id number] DOB:09-09-1965, 57 y.o., female Today's Date: 03/13/2022  PCP: Crecencio Mc, MD REFERRING PROVIDER: Crecencio Mc, MD    PT End of Session - 03/17/22 1257     Visit Number 72    Number of Visits 58    Date for PT Re-Evaluation 05/01/22    PT Start Time 0731    PT Stop Time 0817    PT Time Calculation (min) 46 min    Activity Tolerance Patient tolerated treatment well    Behavior During Therapy [facility] for tasks assessed/performed              Past Medical History:  Diagnosis Date   Allergy    Chronic kidney disease    stones   Fibrocystic breast disease 2013   Meniere disease    Migraines    Past Surgical History:  Procedure Laterality Date   ABDOMINAL HYSTERECTOMY  2011   laprascopic supracervical, [place]   APPENDECTOMY  1999   BREAST BIOPSY Right Nov 2012    fibrocystic disease, [place]   CESAREAN SECTION     4 total   CHOLECYSTECTOMY  2011   [place]   Patient Active Problem List   Diagnosis Date Noted   H/O of hemilaminectomy 02/15/2019   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[place]) 11/17/2018   Fatigue 09/23/2017   History of pneumonia 04/30/2016   Grief reaction 04/30/2016   Vitamin D deficiency 02/22/2015   Hyperlipidemia 01/01/2015   Visit for preventive health examination 12/04/2013   Lower extremity edema 09/02/2013   Essential hypertension 09/02/2013   History of cardiomyopathy 09/02/2013   SVT (supraventricular tachycardia) 09/02/2013   Palpitations 09/02/2013   Contrast media allergy 03/10/2013   Fibrocystic breast disease    Morbid obesity ([place]) 02/05/2012   Meniere disease     REFERRING DIAG:  M79.18 (ICD-10-CM) - Myalgia, other site  Z98.890 (ICD-10-CM) - Other specified postprocedural states  M54.12 (ICD-10-CM) - Radiculopathy, cervical region    THERAPY DIAG:  Joint stiffness of spine  Pain in right  arm  Muscle weakness (generalized)  PERTINENT HISTORY: See evaluation  PRECAUTIONS: N/A  SUBJECTIVE:  (03/13/22)   Pt. Doing well today.  No new complaints.  Pt. Continues to have UT/ cervical paraspinal tightness.      PAIN:  Are you having pain? Yes: NPRS scale: 4/10 Pain location: Neck Pain description: aching/ stiff/ tight Aggravating factors: prolonged sitting/ activity Relieving factors: stretches/ PT  12/5: Cervical AROM: L rotn.: 70 deg./ R rotn. 51 deg.  (R cervical tightness/ catching).  L UT feels tight.   Cervical flexion 60 deg./ extension 38 deg.  L lat. Flexion 38 deg./ R lat. Flexion 38 deg.  Seated B shoulder flexion/ abduction 4/5 MMT (increase R UT/ shoulder discomfort).  Grip: L 76.7#/ R 52.1#.  Reassessed R hand/finger manual dexterity.     TODAY'S TREATMENT:   03/13/22:  There.ex.:  <5 min.  No charge   Reassessment of cervical AROM in sitting before/ after manual tx.  Seated thoracic rotn. After tx. With OP to L/R 2x each.    Manual Therapy:    Supine L/R UT, levator, cervical rotn. Stretches 5x each. Supine cervical grade II-III distraction - 30 s hold x 5.    L/R shoulder AAROM to end-range flexion/ horizontal abduction 5x (slight OP).    Trigger Point Dry Needling (TDN)   Education performed with patient regarding potential benefit of  TDN. Reviewed precautions and risks with patient. Reviewed special precautions/risks over lung fields which include pneumothorax. Reviewed signs and symptoms of pneumothorax and advised pt to go to ER immediately if these symptoms develop advise them of dry needling treatment. Pt provided verbal consent to treatment. TDN performed to L and R UT/ mid-cervical paraspinals with 4, 0.25 x 40 L-type single needle placements on each side with 2 local twitch response (LTR) in R UT region. Pistoning technique utilized. Improved pain-free motion following intervention.  Good tx. Tolerance. Applied Biofreeze to upper back/ neck after tx.    Supine/ prone STM to B UT/upper thoracic/ cervical paraspinals.       PATIENT EDUCATION: Education details: HEP Person educated: Patient Education method: Explanation, Media planner, and Handouts Education comprehension: verbalized understanding and returned demonstration   HOME EXERCISE PROGRAM: [id number].  HEP: [id number]             PT LONG TERM GOAL 1    Title Pt will improve worse cervical pain by at least 3 NPS points to allow increased tolerance during work and home related ADLs.     Baseline NPS: 8/10 current, 7/10 best, 10/10 Worse. 10/20: Worse pain 7/10.  10/12: 5/10 neck pain at worst (improving)- stress related    Time 8     Period Weeks     Status Partially Met     Target Date 05/01/2022          PT LONG TERM GOAL 2    Title Pt will improve bilat UE MMT by atleast 1/2 grade to facilitate greater functional abilities with ADLs related to caregiving.     Baseline UE MMT R/L:   Shoulder Flexion: 4/4-  Shoulder abduction: 4/4-  Shoulder ER: 4/4  Shoulder IR: 4/4  Elbow ext: 5/5   Eblow flex: 5/5 10/20: UE MMT R/L:   Shoulder Flexion: 4/4  Shoulder abduction: 4/4 Shoulder ER: 4/4  Shoulder IR: 4/4  Elbow ext: 5/5   Elbow flex: 5/5.  10/12: B shoulder flexion 4/5 MMT, abduction 4/5 MMT biceps/triceps 5/5 MMT.     Time 8     Period Weeks     Status Partially Met     Target Date 05/01/2022           PT LONG TERM GOAL 3    Title Pt will improve functional R hand strength equal to L to promote return of hand useage during grooming and feeding.     Baseline Grip R/L: 76.1 / 33.9    Lateral prehension grip R/L: 14 / 4 10/20: Grip R/L: 62.0 / 41.4    Lateral prehension grip R/L: 13 / 6.  1/12:  R 69#/ L 49.8#     Time 8     Period Weeks     Status Partially Met     Target Date 05/01/2022            Plan -     Clinical Impression Statement Pt. Reports muscle tightness in L>R UT and cervical musculature.  No pain with seated thoracic rotn. After tx. And PT reassess cervical  AROM.  Tx. focus on manual stretches/ STM to cervical spine/ upper back and HEP to improve to focus on UE strength training.  Decrease noted in overall UT  trigger points and good tolerance to TDN/ STM.  Pt. Has full cervical rotn. To L/R.  Pt. understands current HEP and importance of being aware of proper posture/ body mechanics with work-related tasks and household activities. Pt. Will  focus on current UE ex./ cervical stretches to increase pain-free mobility.  Pt. Instructed to contact PT if any questions or concerns.     Personal Factors and Comorbidities Comorbidity 2;Profession;Past/Current Experience    Comorbidities Hypertension, Obesity    Examination-Activity Limitations Squat;Locomotion Level    Examination-Participation Restrictions Occupation;Cleaning;Laundry    Stability/Clinical Decision Making Evolving/Moderate complexity    Clinical Decision Making Moderate    Rehab Potential Good    PT Frequency 1-2x/biweekly   PT Duration 8 weeks    PT Treatment/Interventions ADLs/Self Care Home Management;Aquatic Therapy;Biofeedback;Cryotherapy;Electrical Stimulation;Moist Heat;Ultrasound;Functional mobility training;Therapeutic activities;Therapeutic exercise;Balance training;Neuromuscular re-education;Patient/family education;Manual techniques;Scar mobilization;Passive range of motion;Dry needling;Energy conservation;Taping;Gait training;Stair training;DME Instruction;Spinal Manipulations;Joint Manipulations, Canalith repositioning procedure.    PT Next Visit Plan Check goals/ discuss POC.     PT Home Exercise Plan [id number].   HEP: [id number]    Consulted and Agree with Plan of Care Patient         Pura Spice, PT, DPT # [phone] 03/14/2022, 1:42 PM

## 2022-03-20 ENCOUNTER — Encounter: Payer: BC Managed Care – PPO | Admitting: Physical Therapy

## 2022-03-27 ENCOUNTER — Encounter: Payer: Self-pay | Admitting: Internal Medicine

## 2022-03-27 ENCOUNTER — Ambulatory Visit (INDEPENDENT_AMBULATORY_CARE_PROVIDER_SITE_OTHER): Payer: BC Managed Care – PPO

## 2022-03-27 ENCOUNTER — Ambulatory Visit: Payer: BC Managed Care – PPO | Admitting: Physical Therapy

## 2022-03-27 ENCOUNTER — Encounter: Payer: Self-pay | Admitting: Physical Therapy

## 2022-03-27 ENCOUNTER — Ambulatory Visit: Payer: BC Managed Care – PPO | Admitting: Internal Medicine

## 2022-03-27 VITALS — BP 162/84 | HR 77 | Temp 98.4°F | Ht 69.0 in | Wt 259.2 lb

## 2022-03-27 DIAGNOSIS — E782 Mixed hyperlipidemia: Secondary | ICD-10-CM | POA: Diagnosis not present

## 2022-03-27 DIAGNOSIS — R7301 Impaired fasting glucose: Secondary | ICD-10-CM

## 2022-03-27 DIAGNOSIS — M4692 Unspecified inflammatory spondylopathy, cervical region: Secondary | ICD-10-CM

## 2022-03-27 DIAGNOSIS — R7989 Other specified abnormal findings of blood chemistry: Secondary | ICD-10-CM | POA: Diagnosis not present

## 2022-03-27 DIAGNOSIS — R059 Cough, unspecified: Secondary | ICD-10-CM | POA: Diagnosis not present

## 2022-03-27 DIAGNOSIS — I10 Essential (primary) hypertension: Secondary | ICD-10-CM | POA: Diagnosis not present

## 2022-03-27 DIAGNOSIS — R5383 Other fatigue: Secondary | ICD-10-CM | POA: Diagnosis not present

## 2022-03-27 DIAGNOSIS — M2560 Stiffness of unspecified joint, not elsewhere classified: Secondary | ICD-10-CM

## 2022-03-27 DIAGNOSIS — M79601 Pain in right arm: Secondary | ICD-10-CM

## 2022-03-27 DIAGNOSIS — J019 Acute sinusitis, unspecified: Secondary | ICD-10-CM

## 2022-03-27 DIAGNOSIS — M5412 Radiculopathy, cervical region: Secondary | ICD-10-CM

## 2022-03-27 DIAGNOSIS — I471 Supraventricular tachycardia, unspecified: Secondary | ICD-10-CM | POA: Diagnosis not present

## 2022-03-27 DIAGNOSIS — Z1231 Encounter for screening mammogram for malignant neoplasm of breast: Secondary | ICD-10-CM

## 2022-03-27 DIAGNOSIS — Z0000 Encounter for general adult medical examination without abnormal findings: Secondary | ICD-10-CM | POA: Diagnosis not present

## 2022-03-27 DIAGNOSIS — R053 Chronic cough: Secondary | ICD-10-CM

## 2022-03-27 DIAGNOSIS — M6281 Muscle weakness (generalized): Secondary | ICD-10-CM | POA: Diagnosis not present

## 2022-03-27 DIAGNOSIS — B9689 Other specified bacterial agents as the cause of diseases classified elsewhere: Secondary | ICD-10-CM

## 2022-03-27 DIAGNOSIS — U099 Post covid-19 condition, unspecified: Secondary | ICD-10-CM

## 2022-03-27 LAB — CBC WITH DIFFERENTIAL/PLATELET
Basophils Absolute: 0.1 10*3/uL (ref 0.0–0.1)
Basophils Relative: 1.1 % (ref 0.0–3.0)
Eosinophils Absolute: 0.1 10*3/uL (ref 0.0–0.7)
Eosinophils Relative: 1.8 % (ref 0.0–5.0)
HCT: 39 % (ref 36.0–46.0)
Hemoglobin: 13.1 g/dL (ref 12.0–15.0)
Lymphocytes Relative: 28.8 % (ref 12.0–46.0)
Lymphs Abs: 1.7 10*3/uL (ref 0.7–4.0)
MCHC: 33.5 g/dL (ref 30.0–36.0)
MCV: 89.2 fl (ref 78.0–100.0)
Monocytes Absolute: 0.2 10*3/uL (ref 0.1–1.0)
Monocytes Relative: 3.7 % (ref 3.0–12.0)
Neutro Abs: 3.8 10*3/uL (ref 1.4–7.7)
Neutrophils Relative %: 64.6 % (ref 43.0–77.0)
Platelets: 335 10*3/uL (ref 150.0–400.0)
RBC: 4.37 Mil/uL (ref 3.87–5.11)
RDW: 14.6 % (ref 11.5–15.5)
WBC: 5.9 10*3/uL (ref 4.0–10.5)

## 2022-03-27 LAB — LIPID PANEL
Cholesterol: 239 mg/dL — ABNORMAL HIGH (ref 0–200)
HDL: 52.4 mg/dL (ref >39.00)
NonHDL: 186.26
Total CHOL/HDL Ratio: 5
Triglycerides: 219 mg/dL — ABNORMAL HIGH (ref 0.0–149.0)
VLDL: 43.8 mg/dL — ABNORMAL HIGH (ref 0.0–40.0)

## 2022-03-27 LAB — COMPREHENSIVE METABOLIC PANEL
ALT: 16 U/L (ref 0–35)
AST: 13 U/L (ref 0–37)
Albumin: 4 g/dL (ref 3.5–5.2)
Alkaline Phosphatase: 84 U/L (ref 39–117)
BUN: 13 mg/dL (ref 6–23)
CO2: 29 mEq/L (ref 19–32)
Calcium: 9.8 mg/dL (ref 8.4–10.5)
Chloride: 103 mEq/L (ref 96–112)
Creatinine, Ser: 0.89 mg/dL (ref 0.40–1.20)
GFR: 71.89 mL/min (ref >60.00)
Glucose, Bld: 79 mg/dL (ref 70–99)
Potassium: 4.4 mEq/L (ref 3.5–5.1)
Sodium: 140 mEq/L (ref 135–145)
Total Bilirubin: 0.6 mg/dL (ref 0.2–1.2)
Total Protein: 6.3 g/dL (ref 6.0–8.3)

## 2022-03-27 LAB — MICROALBUMIN / CREATININE URINE RATIO
Creatinine,U: 149.4 mg/dL
Microalb Creat Ratio: 0.5 mg/g (ref 0.0–30.0)
Microalb, Ur: <0.7 mg/dL (ref 0.0–1.9)

## 2022-03-27 LAB — LDL CHOLESTEROL, DIRECT: Direct LDL: 160 mg/dL

## 2022-03-27 MED ORDER — BENZONATATE 100 MG PO CAPS: 100.0000 mg | ORAL_CAPSULE | Freq: Three times a day (TID) | ORAL | 5 refills | Status: DC | PRN

## 2022-03-27 MED ORDER — METHOCARBAMOL 500 MG PO TABS: 500.0000 mg | ORAL_TABLET | Freq: Two times a day (BID) | ORAL | 3 refills | Status: DC

## 2022-03-27 MED ORDER — OMEPRAZOLE 40 MG PO CPDR: 40.0000 mg | DELAYED_RELEASE_CAPSULE | Freq: Every day | ORAL | 3 refills | Status: DC

## 2022-03-27 MED ORDER — CYCLOBENZAPRINE HCL 10 MG PO TABS: 10.0000 mg | ORAL_TABLET | Freq: Every day | ORAL | 1 refills | Status: DC

## 2022-03-27 NOTE — Progress Notes (Signed)
Patient ID: Amanda Humphrey, female    DOB: Oct 07, 1965  Age: 57 y.o. MRN: [id number]  The patient is here for annual preventive examination and management of other chronic and acute problems.   The risk factors are reflected in the social history.   The roster of all physicians providing medical care to patient - is listed in the Snapshot section of the chart.   Activities of daily living:  The patient is 100% independent in all ADLs: dressing, toileting, feeding as well as independent mobility   Home safety : The patient has smoke detectors in the home. They wear seatbelts.  There are no unsecured firearms at home. There is no violence in the home.    There is no risks for hepatitis, STDs or HIV. There is no   history of blood transfusion. They have no travel history to infectious disease endemic areas of the world.   The patient has seen their dentist in the last six month. They have seen their eye doctor in the last year. The patinet  denies slight hearing difficulty with regard to whispered voices and some television programs.  They have deferred audiologic testing in the last year.  They do not  have excessive sun exposure. Discussed the need for sun protection: hats, long sleeves and use of sunscreen if there is significant sun exposure.    Diet: the importance of a healthy diet is discussed. They do have a healthy diet.   The benefits of regular aerobic exercise were discussed. The patient  exercises  3 to 5 days per week  for  60 minutes.    Depression screen: there are no signs or vegative symptoms of depression- irritability, change in appetite, anhedonia, sadness/tearfullness.   The following portions of the patient's history were reviewed and updated as appropriate: allergies, current medications, past family history, past medical history,  past surgical history, past social history  and problem list.   Visual acuity was not assessed per patient preference since the patient has  regular follow up with an  ophthalmologist. Hearing and body mass index were assessed and reviewed.    During the course of the visit the patient was educated and counseled about appropriate screening and preventive services including : fall prevention , diabetes screening, nutrition counseling, colorectal cancer screening, and recommended immunizations.    Chief Complaint:  Post COVID cough since September.  Gets vertigo frequently with any congestion,  had vestibular PT , treated for neuritis with steroids.  Multiple steroid rounds from urgent care . (4,  rounds,  last one around December )   Neck pain : h/o cervical spine surgery,  doing dry needling for mgmt of nerve pan and spasms,  using MRs and and  getting PT through [facility].     Review of Symptoms  Patient denies headache, fevers, malaise, unintentional weight loss, skin rash, eye pain, sinus congestion and sinus pain, sore throat, dysphagia,  hemoptysis , cough, dyspnea, wheezing, chest pain, palpitations, orthopnea, edema, abdominal pain, nausea, melena, diarrhea, constipation, flank pain, dysuria, hematuria, urinary  Frequency, nocturia, numbness, tingling, seizures,  Focal weakness, Loss of consciousness,  Tremor, insomnia, depression, anxiety, and suicidal ideation.    Physical Exam:  BP (!) 162/84   Pulse 77   Temp 98.4 F (36.9 C) (Oral)   Ht '5\' 9"'$  (1.753 m)   Wt 259 lb 3.2 oz (117.6 kg)   SpO2 99%   BMI 38.28 kg/m    Physical Exam Vitals reviewed.  Constitutional:  General: She is not in acute distress.    Appearance: Normal appearance. She is normal weight. She is not ill-appearing, toxic-appearing or diaphoretic.  HENT:     Head: Normocephalic.  Eyes:     General: No scleral icterus.       Right eye: No discharge.        Left eye: No discharge.     Conjunctiva/sclera: Conjunctivae normal.  Cardiovascular:     Rate and Rhythm: Normal rate and regular rhythm.     Heart sounds: Normal heart sounds.   Pulmonary:     Effort: Pulmonary effort is normal. No respiratory distress.     Breath sounds: Normal breath sounds.  Musculoskeletal:        General: Normal range of motion.  Skin:    General: Skin is warm and dry.  Neurological:     General: No focal deficit present.     Mental Status: She is alert and oriented to person, place, and time. Mental status is at baseline.  Psychiatric:        Mood and Affect: Mood normal.        Behavior: Behavior normal.        Thought Content: Thought content normal.        Judgment: Judgment normal.     Assessment and Plan: Essential hypertension -     Comprehensive metabolic panel -     Microalbumin / creatinine urine ratio  Visit for preventive health examination Assessment & Plan: age appropriate education and counseling updated, referrals for preventative services and immunizations addressed, dietary and smoking counseling addressed, most recent labs reviewed.  I have personally reviewed and have noted:   1) the patient's medical and social history 2) The pt's use of alcohol, tobacco, and illicit drugs 3) The patient's current medications and supplements 4) Functional ability including ADL's, fall risk, home safety risk, hearing and visual impairment 5) Diet and physical activities 6) Evidence for depression or mood disorder 7) The patient's height, weight, and BMI have been recorded in the chart  I have made referrals, and provided counseling and education based on review of the above    Mixed hyperlipidemia Assessment & Plan: Tolerating rosuvastatin currently once weekly due to recurrent myalgias with more frequent dosing . LFTS are normal.   Lab Results  Component Value Date   CHOL 239 (H) 03/27/2022   HDL 52.40 03/27/2022   LDLCALC 168 (H) 03/20/2021   LDLDIRECT 160.0 03/27/2022   TRIG 219.0 (H) 03/27/2022   CHOLHDL 5 03/27/2022   Lab Results  Component Value Date   ALT 16 03/27/2022   AST 13 03/27/2022   ALKPHOS 84  03/27/2022   BILITOT 0.6 03/27/2022     Orders: -     LDL cholesterol, direct -     Lipid panel  Impaired fasting glucose -     Comprehensive metabolic panel -     Hemoglobin A1c  Fatigue, unspecified type -     CBC with Differential/Platelet  Encounter for screening mammogram for malignant neoplasm of breast -     3D Screening Mammogram, Left and Right; Future  Acute bacterial sinusitis -     Benzonatate; Take 1 capsule (100 mg total) by mouth 3 (three) times daily as needed.  Dispense: 60 capsule; Refill: 5  Cervical spondylitis with radiculitis [facility]) Assessment & Plan: Managing symptoms of headaches,  Muscle spasms  left arm with dry needling every 2 weeks and PT  Every other week. Pain has significantly improved,  She is intent on avoiding second surgery   Orders: -     Omeprazole; Take 1 capsule (40 mg total) by mouth daily.  Dispense: 90 capsule; Refill: 3  Abnormal thyroid blood test -     Thyroid Panel With TSH  Cough in adult -     DG Chest 2 View; Future  Morbid obesity ([place]) Assessment & Plan:  With concurrent hypertension and hyperlipidemia.  I have addressed  BMI and recommended wt loss of 10% of body weight over the next 6 months using a low glycemic index diet and regular participation in aerobic exercise a minimum of 30 minutes, a  minimum of 5 days per week.    SVT (supraventricular tachycardia) Assessment & Plan: Managed with carvedilol and prn diltiazem.    Post-COVID chronic cough Assessment & Plan: Chest x ray ordered.  Continue cough suppression    Other orders -     Cyclobenzaprine HCl; Take 1 tablet (10 mg total) by mouth at bedtime.  Dispense: 90 tablet; Refill: 1 -     Methocarbamol; Take 1 tablet (500 mg total) by mouth 2 (two) times daily.  Dispense: 270 tablet; Refill: 3    No follow-ups on file.  Crecencio Mc, MD

## 2022-03-27 NOTE — Therapy (Signed)
OUTPATIENT PHYSICAL THERAPY TREATMENT NOTE   Patient Name: Amanda Humphrey MRN: [id number] DOB:08-Jun-1965, 57 y.o., female Today's Date: 03/27/2022  PCP: Crecencio Mc, MD REFERRING PROVIDER: Crecencio Mc, MD    PT End of Session - 03/27/22 1821     Visit Number [id number]    Number of Visits 45    Date for PT Re-Evaluation 05/01/22    PT Start Time 0723    PT Stop Time 0751    PT Time Calculation (min) 28 min    Activity Tolerance Patient tolerated treatment well    Behavior During Therapy [facility] for tasks assessed/performed             Past Medical History:  Diagnosis Date   Allergy    Arthritis    Chronic kidney disease    stones   Fibrocystic breast disease 2013   GERD (gastroesophageal reflux disease)    Meniere disease    Migraines    Past Surgical History:  Procedure Laterality Date   ABDOMINAL HYSTERECTOMY  2011   laprascopic supracervical, [place]   APPENDECTOMY  1999   BREAST BIOPSY Right 11/2010    fibrocystic disease, [place]   CESAREAN SECTION     4 total   CHOLECYSTECTOMY  2011   [place]  2021   [place]   TUBAL LIGATION     Patient Active Problem List   Diagnosis Date Noted   H/O of hemilaminectomy 02/15/2019   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[place]) 11/17/2018   Fatigue 09/23/2017   History of pneumonia 04/30/2016   Grief reaction 04/30/2016   Vitamin D deficiency 02/22/2015   Hyperlipidemia 01/01/2015   Visit for preventive health examination 12/04/2013   Lower extremity edema 09/02/2013   Essential hypertension 09/02/2013   History of cardiomyopathy 09/02/2013   SVT (supraventricular tachycardia) 09/02/2013   Palpitations 09/02/2013   Contrast media allergy 03/10/2013   Fibrocystic breast disease    Morbid obesity ([place]) 02/05/2012   Meniere disease     REFERRING DIAG:  M79.18 (ICD-10-CM) - Myalgia, other site  Z98.890 (ICD-10-CM) - Other specified postprocedural states  M54.12 (ICD-10-CM) -  Radiculopathy, cervical region    THERAPY DIAG:  Joint stiffness of spine  Pain in right arm  Muscle weakness (generalized)  PERTINENT HISTORY: See evaluation  PRECAUTIONS: N/A  SUBJECTIVE:  (03/27/22)     Pt. Continues to have UT/ cervical paraspinal tightness and discomfort.  Pt. Reports 4/10 R UT/cervical discomfort    PAIN:  Are you having pain? Yes: NPRS scale: 4/10 Pain location: Neck Pain description: aching/ stiff/ tight Aggravating factors: prolonged sitting/ activity Relieving factors: stretches/ PT  12/5: Cervical AROM: L rotn.: 70 deg./ R rotn. 51 deg.  (R cervical tightness/ catching).  L UT feels tight.   Cervical flexion 60 deg./ extension 38 deg.  L lat. Flexion 38 deg./ R lat. Flexion 38 deg.  Seated B shoulder flexion/ abduction 4/5 MMT (increase R UT/ shoulder discomfort).  Grip: L 76.7#/ R 52.1#.  Reassessed R hand/finger manual dexterity.     TODAY'S TREATMENT:   03/27/22:  There.ex.:  <5 min.  No charge   Reassessment of cervical and B shoulder AROM in seated posture before/ after manual tx.      Manual Therapy:    Supine L/R UT, levator, cervical rotn. Stretches 5x each. Supine cervical grade II-III distraction and suboccipital release - 30 s hold x 5.    L/R shoulder AAROM to end-range flexion/ horizontal abduction 5x (slight OP).  Trigger Point Dry Needling (TDN)   Education performed with patient regarding potential benefit of TDN. Reviewed precautions and risks with patient. Reviewed special precautions/risks over lung fields which include pneumothorax. Reviewed signs and symptoms of pneumothorax and advised pt to go to ER immediately if these symptoms develop advise them of dry needling treatment. Pt provided verbal consent to treatment. TDN performed to L and R UT/ mid-cervical paraspinals with 4, 0.25 x 40 L-type single needle placements on each side with 2 local twitch response (LTR) in R UT region. Pistoning technique utilized. Improved  pain-free motion following intervention.  Good tx. Tolerance. Applied Biofreeze to upper back/ neck after tx.   Supine/ prone STM to B UT/upper thoracic/ cervical paraspinals.       PATIENT EDUCATION: Education details: HEP Person educated: Patient Education method: Explanation, Media planner, and Handouts Education comprehension: verbalized understanding and returned demonstration   HOME EXERCISE PROGRAM: [id number].  HEP: [id number]             PT LONG TERM GOAL 1    Title Pt will improve worse cervical pain by at least 3 NPS points to allow increased tolerance during work and home related ADLs.     Baseline NPS: 8/10 current, 7/10 best, 10/10 Worse. 10/20: Worse pain 7/10.  10/12: 5/10 neck pain at worst (improving)- stress related    Time 8     Period Weeks     Status Partially Met     Target Date 05/01/2022          PT LONG TERM GOAL 2    Title Pt will improve bilat UE MMT by atleast 1/2 grade to facilitate greater functional abilities with ADLs related to caregiving.     Baseline UE MMT R/L:   Shoulder Flexion: 4/4-  Shoulder abduction: 4/4-  Shoulder ER: 4/4  Shoulder IR: 4/4  Elbow ext: 5/5   Eblow flex: 5/5 10/20: UE MMT R/L:   Shoulder Flexion: 4/4  Shoulder abduction: 4/4 Shoulder ER: 4/4  Shoulder IR: 4/4  Elbow ext: 5/5   Elbow flex: 5/5.  10/12: B shoulder flexion 4/5 MMT, abduction 4/5 MMT biceps/triceps 5/5 MMT.     Time 8     Period Weeks     Status Partially Met     Target Date 05/01/2022           PT LONG TERM GOAL 3    Title Pt will improve functional R hand strength equal to L to promote return of hand useage during grooming and feeding.     Baseline Grip R/L: 76.1 / 33.9    Lateral prehension grip R/L: 14 / 4 10/20: Grip R/L: 62.0 / 41.4    Lateral prehension grip R/L: 13 / 6.  1/12:  R 69#/ L 49.8#     Time 8     Period Weeks     Status Partially Met     Target Date 05/01/2022            Plan -     Clinical Impression Statement Pt. Reports muscle tightness  in L>R UT and cervical musculature.  No pain with seated thoracic rotn. After tx. And PT reassess cervical AROM.  Tx. focus on manual stretches/ STM to cervical spine/ upper back and HEP to improve to focus on UE strength training.  Decrease noted in overall UT  trigger points and good tolerance to TDN/ STM.  Pt. Has full cervical rotn. To L/R.  Pt. understands current HEP and importance of  being aware of proper posture/ body mechanics with work-related tasks and household activities. Pt. Will focus on current UE ex./ cervical stretches to increase pain-free mobility.  Pt. Instructed to contact PT if any questions or concerns.     Personal Factors and Comorbidities Comorbidity 2;Profession;Past/Current Experience    Comorbidities Hypertension, Obesity    Examination-Activity Limitations Squat;Locomotion Level    Examination-Participation Restrictions Occupation;Cleaning;Laundry    Stability/Clinical Decision Making Evolving/Moderate complexity    Clinical Decision Making Moderate    Rehab Potential Good    PT Frequency 1-2x/biweekly   PT Duration 8 weeks    PT Treatment/Interventions ADLs/Self Care Home Management;Aquatic Therapy;Biofeedback;Cryotherapy;Electrical Stimulation;Moist Heat;Ultrasound;Functional mobility training;Therapeutic activities;Therapeutic exercise;Balance training;Neuromuscular re-education;Patient/family education;Manual techniques;Scar mobilization;Passive range of motion;Dry needling;Energy conservation;Taping;Gait training;Stair training;DME Instruction;Spinal Manipulations;Joint Manipulations, Canalith repositioning procedure.    PT Next Visit Plan Check goals/ discuss POC.     PT Home Exercise Plan [id number].   HEP: [id number]    Consulted and Agree with Plan of Care Patient         Pura Spice, PT, DPT # [phone] 03/27/2022, 6:36 PM

## 2022-03-28 DIAGNOSIS — U099 Post covid-19 condition, unspecified: Secondary | ICD-10-CM | POA: Insufficient documentation

## 2022-03-28 LAB — THYROID PANEL WITH TSH
Free Thyroxine Index: 2.2 (ref 1.4–3.8)
T3 Uptake: 30 % (ref 22–35)
T4, Total: 7.2 ug/dL (ref 5.1–11.9)
TSH: 1.22 mIU/L (ref 0.40–4.50)

## 2022-03-28 LAB — HEMOGLOBIN A1C: Hgb A1c MFr Bld: 5.5 % (ref 4.6–6.5)

## 2022-03-28 NOTE — Assessment & Plan Note (Signed)
Tolerating rosuvastatin currently once weekly due to recurrent myalgias with more frequent dosing . LFTS are normal.   Lab Results  Component Value Date   CHOL 239 (H) 03/27/2022   HDL 52.40 03/27/2022   LDLCALC 168 (H) 03/20/2021   LDLDIRECT 160.0 03/27/2022   TRIG 219.0 (H) 03/27/2022   CHOLHDL 5 03/27/2022   Lab Results  Component Value Date   ALT 16 03/27/2022   AST 13 03/27/2022   ALKPHOS 84 03/27/2022   BILITOT 0.6 03/27/2022

## 2022-03-28 NOTE — Assessment & Plan Note (Signed)
Managed with carvedilol and prn diltiazem.

## 2022-03-28 NOTE — Assessment & Plan Note (Signed)
Chest x ray ordered.  Continue cough suppression

## 2022-03-28 NOTE — Assessment & Plan Note (Signed)
Managing symptoms of headaches,  Muscle spasms  left arm with dry needling every 2 weeks and PT  Every other week. Pain has significantly improved,  She is intent on avoiding second surgery

## 2022-03-28 NOTE — Assessment & Plan Note (Signed)
With concurrent hypertension and hyperlipidemia.  I have addressed  BMI and recommended wt loss of 10% of body weight over the next 6 months using a low glycemic index diet and regular participation in aerobic exercise a minimum of 30 minutes, a  minimum of 5 days per week.

## 2022-03-28 NOTE — Assessment & Plan Note (Signed)

## 2022-03-31 DIAGNOSIS — F900 Attention-deficit hyperactivity disorder, predominantly inattentive type: Secondary | ICD-10-CM | POA: Diagnosis not present

## 2022-04-17 ENCOUNTER — Ambulatory Visit: Payer: BC Managed Care – PPO

## 2022-04-18 ENCOUNTER — Ambulatory Visit: Payer: BC Managed Care – PPO | Attending: Internal Medicine | Admitting: Physical Therapy

## 2022-04-18 DIAGNOSIS — M6281 Muscle weakness (generalized): Secondary | ICD-10-CM

## 2022-04-18 DIAGNOSIS — M79601 Pain in right arm: Secondary | ICD-10-CM | POA: Diagnosis not present

## 2022-04-18 DIAGNOSIS — M2560 Stiffness of unspecified joint, not elsewhere classified: Secondary | ICD-10-CM | POA: Diagnosis not present

## 2022-04-19 ENCOUNTER — Encounter: Payer: Self-pay | Admitting: Physical Therapy

## 2022-04-19 NOTE — Therapy (Signed)
OUTPATIENT PHYSICAL THERAPY TREATMENT NOTE   Patient Name: Amanda Humphrey MRN: [id number] DOB:09-23-1965, 57 y.o., female Today's Date: 04/17/22  PCP: Crecencio Mc, MD REFERRING PROVIDER: Crecencio Mc, MD    PT End of Session - 04/19/22 2032     Visit Number 60    Number of Visits 59    Date for PT Re-Evaluation 05/01/22    PT Start Time 0816    PT Stop Time 0904    PT Time Calculation (min) 48 min    Activity Tolerance Patient tolerated treatment well    Behavior During Therapy [facility] for tasks assessed/performed             Past Medical History:  Diagnosis Date   Allergy    Arthritis    Chronic kidney disease    stones   Fibrocystic breast disease 2013   GERD (gastroesophageal reflux disease)    Meniere disease    Migraines    Past Surgical History:  Procedure Laterality Date   ABDOMINAL HYSTERECTOMY  2011   laprascopic supracervical, [place]   APPENDECTOMY  1999   BREAST BIOPSY Right 11/2010    fibrocystic disease, [place]   CESAREAN SECTION     4 total   CHOLECYSTECTOMY  2011   SPINE SURGERY  2021   [place]   TUBAL LIGATION     Patient Active Problem List   Diagnosis Date Noted   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   H/O of hemilaminectomy 02/15/2019   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[place]) 11/17/2018   Fatigue 09/23/2017   History of pneumonia 04/30/2016   Grief reaction 04/30/2016   Vitamin D deficiency 02/22/2015   Hyperlipidemia 01/01/2015   Visit for preventive health examination 12/04/2013   Lower extremity edema 09/02/2013   Essential hypertension 09/02/2013   History of cardiomyopathy 09/02/2013   SVT (supraventricular tachycardia) 09/02/2013   Contrast media allergy 03/10/2013   Fibrocystic breast disease    Morbid obesity ([place]) 02/05/2012   Meniere disease     REFERRING DIAG:  M79.18 (ICD-10-CM) - Myalgia, other site  Z98.890 (ICD-10-CM) - Other specified postprocedural states  M54.12  (ICD-10-CM) - Radiculopathy, cervical region    THERAPY DIAG:  Joint stiffness of spine  Pain in right arm  Muscle weakness (generalized)  PERTINENT HISTORY: See evaluation  PRECAUTIONS: N/A  SUBJECTIVE:  (04/19/22)     Pt. Has not been seen by PT over past several weeks due to work/ out of town.  Pt. Reports an increase in neck/ UT muscle tightness over past couple weeks.    PAIN:  Are you having pain? Yes: NPRS scale: 4/10 Pain location: Neck Pain description: aching/ stiff/ tight Aggravating factors: prolonged sitting/ activity Relieving factors: stretches/ PT  12/5: Cervical AROM: L rotn.: 70 deg./ R rotn. 51 deg.  (R cervical tightness/ catching).  L UT feels tight.   Cervical flexion 60 deg./ extension 38 deg.  L lat. Flexion 38 deg./ R lat. Flexion 38 deg.  Seated B shoulder flexion/ abduction 4/5 MMT (increase R UT/ shoulder discomfort).  Grip: L 76.7#/ R 52.1#.  Reassessed R hand/finger manual dexterity.     TODAY'S TREATMENT:   04/19/22:  There.ex.:  <5 min.  No charge   Reassessment of cervical and B shoulder AROM in seated posture before/ after manual tx.  Seated thoracic rotation with light OP (no pain).      Manual Therapy:    Supine L/R UT, levator, cervical rotn. Stretches 5x each. Supine cervical grade  II-III distraction and suboccipital release - 30 s hold x 5.    L/R shoulder AAROM to end-range flexion/ horizontal abduction/ ER/ IR/ abduction 5x (slight OP).    Trigger Point Dry Needling (TDN)   Education performed with patient regarding potential benefit of TDN. Reviewed precautions and risks with patient. Reviewed special precautions/risks over lung fields which include pneumothorax. Reviewed signs and symptoms of pneumothorax and advised pt to go to ER immediately if these symptoms develop advise them of dry needling treatment. Pt provided verbal consent to treatment. TDN performed to L and R UT/ mid-cervical paraspinals with 5, 0.25 x 40 L-type single  needle placements on each side with 1 local twitch response (LTR) in R UT region. Pistoning technique utilized. Improved pain-free motion following intervention.  Good tx. Tolerance. Applied Biofreeze to upper back/ neck after tx.   Supine/ prone STM to B UT/upper thoracic/ cervical paraspinals.   Use of Hypervolt in prone position to B UT    PATIENT EDUCATION: Education details: HEP Person educated: Patient Education method: Explanation, Demonstration, and Handouts Education comprehension: verbalized understanding and returned demonstration   HOME EXERCISE PROGRAM: [id number].  HEP: [id number]             PT LONG TERM GOAL 1    Title Pt will improve worse cervical pain by at least 3 NPS points to allow increased tolerance during work and home related ADLs.     Baseline NPS: 8/10 current, 7/10 best, 10/10 Worse. 10/20: Worse pain 7/10.  10/12: 5/10 neck pain at worst (improving)- stress related    Time 8     Period Weeks     Status Partially Met     Target Date 05/01/2022          PT LONG TERM GOAL 2    Title Pt will improve bilat UE MMT by atleast 1/2 grade to facilitate greater functional abilities with ADLs related to caregiving.     Baseline UE MMT R/L:   Shoulder Flexion: 4/4-  Shoulder abduction: 4/4-  Shoulder ER: 4/4  Shoulder IR: 4/4  Elbow ext: 5/5   Eblow flex: 5/5 10/20: UE MMT R/L:   Shoulder Flexion: 4/4  Shoulder abduction: 4/4 Shoulder ER: 4/4  Shoulder IR: 4/4  Elbow ext: 5/5   Elbow flex: 5/5.  10/12: B shoulder flexion 4/5 MMT, abduction 4/5 MMT biceps/triceps 5/5 MMT.     Time 8     Period Weeks     Status Partially Met     Target Date 05/01/2022           PT LONG TERM GOAL 3    Title Pt will improve functional R hand strength equal to L to promote return of hand useage during grooming and feeding.     Baseline Grip R/L: 76.1 / 33.9    Lateral prehension grip R/L: 14 / 4 10/20: Grip R/L: 62.0 / 41.4    Lateral prehension grip R/L: 13 / 6.  1/12:  R 69#/ L 49.8#      Time 8     Period Weeks     Status Partially Met     Target Date 05/01/2022            Plan -     Clinical Impression Statement Pt. Reports muscle tightness in B UT (R tenderness noted) and cervical musculature.  Good cervical/ B shoulder ROM with no increase pain with OP. Tx. focus on manual stretches/ STM to cervical spine/ upper back and HEP  to improve to focus on UE strength training.  Decrease noted in overall UT  trigger points and good tolerance to TDN/ STM.  Pt. Has full cervical rotn. To L/R.  Pt. understands current HEP and importance of being aware of proper posture/ body mechanics with work-related tasks and household activities. Pt. Will focus on current UE ex./ cervical stretches to increase pain-free mobility.  Pt. Instructed to contact PT if any questions or concerns.     Personal Factors and Comorbidities Comorbidity 2;Profession;Past/Current Experience    Comorbidities Hypertension, Obesity    Examination-Activity Limitations Squat;Locomotion Level    Examination-Participation Restrictions Occupation;Cleaning;Laundry    Stability/Clinical Decision Making Evolving/Moderate complexity    Clinical Decision Making Moderate    Rehab Potential Good    PT Frequency 1-2x/biweekly   PT Duration 8 weeks    PT Treatment/Interventions ADLs/Self Care Home Management;Aquatic Therapy;Biofeedback;Cryotherapy;Electrical Stimulation;Moist Heat;Ultrasound;Functional mobility training;Therapeutic activities;Therapeutic exercise;Balance training;Neuromuscular re-education;Patient/family education;Manual techniques;Scar mobilization;Passive range of motion;Dry needling;Energy conservation;Taping;Gait training;Stair training;DME Instruction;Spinal Manipulations;Joint Manipulations, Canalith repositioning procedure.    PT Next Visit Plan Check goals/ discuss POC.     PT Home Exercise Plan [email or web address].   HEP: [id number]    Consulted and Agree with Plan of Care Patient         Pura Spice, PT, DPT  # [phone] 04/19/2022, 8:40 PM

## 2022-04-21 ENCOUNTER — Ambulatory Visit
Admission: RE | Admit: 2022-04-21 | Discharge: 2022-04-21 | Disposition: A | Discharge status: Home / Self Care | Payer: BC Managed Care – PPO | Source: Ambulatory Visit | Attending: Internal Medicine | Admitting: Internal Medicine

## 2022-04-21 DIAGNOSIS — Z1231 Encounter for screening mammogram for malignant neoplasm of breast: Secondary | ICD-10-CM | POA: Insufficient documentation

## 2022-05-01 ENCOUNTER — Ambulatory Visit: Payer: BC Managed Care – PPO | Attending: Internal Medicine | Admitting: Physical Therapy

## 2022-05-01 ENCOUNTER — Encounter: Payer: Self-pay | Admitting: Physical Therapy

## 2022-05-01 DIAGNOSIS — M2560 Stiffness of unspecified joint, not elsewhere classified: Secondary | ICD-10-CM | POA: Diagnosis not present

## 2022-05-01 DIAGNOSIS — M79601 Pain in right arm: Secondary | ICD-10-CM | POA: Diagnosis not present

## 2022-05-01 DIAGNOSIS — M6281 Muscle weakness (generalized): Secondary | ICD-10-CM | POA: Insufficient documentation

## 2022-05-01 NOTE — Therapy (Signed)
OUTPATIENT PHYSICAL THERAPY TREATMENT NOTE   Patient Name: Amanda Humphrey MRN: [id number] DOB:[birth year], 57 y.o., female Today's Date: 05/01/2022  PCP: Crecencio Mc, MD REFERRING PROVIDER: Crecencio Mc, MD    PT End of Session - 05/01/22 0950     Visit Number 50    Number of Visits 29    Date for PT Re-Evaluation 05/01/22    PT Start Time 0951    PT Stop Time [id number]    PT Time Calculation (min) 41 min    Activity Tolerance Patient tolerated treatment well    Behavior During Therapy [facility] for tasks assessed/performed             Past Medical History:  Diagnosis Date   Allergy    Arthritis    Chronic kidney disease    stones   Fibrocystic breast disease 2013   GERD (gastroesophageal reflux disease)    Meniere disease    Migraines    Past Surgical History:  Procedure Laterality Date   ABDOMINAL HYSTERECTOMY  2011   laprascopic supracervical, [place]   APPENDECTOMY  1999   BREAST BIOPSY Right 11/2010    fibrocystic disease, [place]   CESAREAN SECTION     4 total   CHOLECYSTECTOMY  2011   SPINE SURGERY  2021   [place]   TUBAL LIGATION     Patient Active Problem List   Diagnosis Date Noted   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   H/O of hemilaminectomy 02/15/2019   Cervical spondylitis with radiculitis 11/17/2018   Fatigue 09/23/2017   History of pneumonia 04/30/2016   Grief reaction 04/30/2016   Vitamin D deficiency 02/22/2015   Hyperlipidemia 01/01/2015   Visit for preventive health examination 12/04/2013   Lower extremity edema 09/02/2013   Essential hypertension 09/02/2013   History of cardiomyopathy 09/02/2013   SVT (supraventricular tachycardia) 09/02/2013   Contrast media allergy 03/10/2013   Fibrocystic breast disease    Morbid obesity 02/05/2012   Meniere disease     REFERRING DIAG:  M79.18 (ICD-10-CM) - Myalgia, other site  Z98.890 (ICD-10-CM) - Other specified postprocedural states  M54.12 (ICD-10-CM) -  Radiculopathy, cervical region    THERAPY DIAG:  Joint stiffness of spine  Pain in right arm  Muscle weakness (generalized)  PERTINENT HISTORY: See evaluation  PRECAUTIONS: N/A  SUBJECTIVE:  05/01/2022 Pt. States she has been busy with work.  Pt. Scheduled to go out of town next week to [place] for work conference.    PAIN:  Are you having pain? Yes: NPRS scale: 4/10 neck/ R arm ("numbness/tingling in pinky/ring finger").   Pain location: Neck Pain description: aching/ stiff/ tight Aggravating factors: prolonged sitting/ activity Relieving factors: stretches/ PT  12/5: Cervical AROM: L rotn.: 70 deg./ R rotn. 51 deg.  (R cervical tightness/ catching).  L UT feels tight.   Cervical flexion 60 deg./ extension 38 deg.  L lat. Flexion 38 deg./ R lat. Flexion 38 deg.  Seated B shoulder flexion/ abduction 4/5 MMT (increase R UT/ shoulder discomfort).  Grip: L 76.7#/ R 52.1#.  Reassessed R hand/finger manual dexterity.     TODAY'S TREATMENT:    05/01/2022  There.ex.:  <5 min.  No charge   Supine cervical rotn./ lateral flexion AROM and manual isometrics 2x each.  Seated thoracic rotation with light OP (no pain).      Manual Therapy:    Supine L/R UT, levator, cervical rotn. Stretches 4x each. Supine cervical grade II-III distraction and suboccipital release -  30 s hold x 3.    L/R shoulder AAROM to end-range flexion/ horizontal abduction/ ER/ IR/ abduction 5x (slight OP).  No change in R UE radicular symptoms.    Trigger Point Dry Needling (TDN)   Education performed with patient regarding potential benefit of TDN. Reviewed precautions and risks with patient. Reviewed special precautions/risks over lung fields which include pneumothorax. Reviewed signs and symptoms of pneumothorax and advised pt to go to ER immediately if these symptoms develop advise them of dry needling treatment. Pt provided verbal consent to treatment. TDN performed to L and R UT/ mid-cervical paraspinals with 4,  0.25 x 40 L-type single needle placements on each side with 1 local twitch response (LTR) in R UT region. Pistoning technique utilized. Improved pain-free motion following intervention.  Good tx. Tolerance. Applied Biofreeze to upper back/ neck after tx.   Supine/ prone STM to B UT/upper thoracic/ cervical paraspinals.   No Hypervolt today.      PATIENT EDUCATION: Education details: HEP Person educated: Patient Education method: Explanation, Media planner, and Handouts Education comprehension: verbalized understanding and returned demonstration   HOME EXERCISE PROGRAM: [id number].  HEP: [id number]             PT LONG TERM GOAL 1    Title Pt will improve worse cervical pain by at least 3 NPS points to allow increased tolerance during work and home related ADLs.     Baseline NPS: 8/10 current, 7/10 best, 10/10 Worse. 10/20: Worse pain 7/10.  10/12: 5/10 neck pain at worst (improving)- stress related    Time 8     Period Weeks     Status Partially Met     Target Date 05/01/2022          PT LONG TERM GOAL 2    Title Pt will improve bilat UE MMT by atleast 1/2 grade to facilitate greater functional abilities with ADLs related to caregiving.     Baseline UE MMT R/L:   Shoulder Flexion: 4/4-  Shoulder abduction: 4/4-  Shoulder ER: 4/4  Shoulder IR: 4/4  Elbow ext: 5/5   Eblow flex: 5/5 10/20: UE MMT R/L:   Shoulder Flexion: 4/4  Shoulder abduction: 4/4 Shoulder ER: 4/4  Shoulder IR: 4/4  Elbow ext: 5/5   Elbow flex: 5/5.  10/12: B shoulder flexion 4/5 MMT, abduction 4/5 MMT biceps/triceps 5/5 MMT.     Time 8     Period Weeks     Status Partially Met     Target Date 05/01/2022           PT LONG TERM GOAL 3    Title Pt will improve functional R hand strength equal to L to promote return of hand useage during grooming and feeding.     Baseline Grip R/L: 76.1 / 33.9    Lateral prehension grip R/L: 14 / 4 10/20: Grip R/L: 62.0 / 41.4    Lateral prehension grip R/L: 13 / 6.  1/12:  R 69#/ L 49.8#      Time 8     Period Weeks     Status Partially Met     Target Date 05/01/2022            Plan -     Clinical Impression Statement Pt. Reports muscle tightness in B UT and cervical musculature.  Pt. C/o R UE symptoms into R hand/fingers.  Good cervical/ B shoulder ROM with no increase pain with OP. Tx. focus on manual stretches/ STM to cervical spine/  upper back and HEP to improve to focus on UE strength training.  Decrease noted in overall UT  trigger points and good tolerance to TDN/ STM.  Pt. Has full cervical rotn. To L/R and muscle activation during isometrics.  Pt. understands current HEP and importance of being aware of proper posture/ body mechanics with work-related tasks and household activities. Pt. Will focus on current UE ex./ cervical stretches to increase pain-free mobility.  Pt. Instructed to contact PT if any questions or concerns.     Personal Factors and Comorbidities Comorbidity 2;Profession;Past/Current Experience    Comorbidities Hypertension, Obesity    Examination-Activity Limitations Squat;Locomotion Level    Examination-Participation Restrictions Occupation;Cleaning;Laundry    Stability/Clinical Decision Making Evolving/Moderate complexity    Clinical Decision Making Moderate    Rehab Potential Good    PT Frequency 1-2x/biweekly   PT Duration 8 weeks    PT Treatment/Interventions ADLs/Self Care Home Management;Aquatic Therapy;Biofeedback;Cryotherapy;Electrical Stimulation;Moist Heat;Ultrasound;Functional mobility training;Therapeutic activities;Therapeutic exercise;Balance training;Neuromuscular re-education;Patient/family education;Manual techniques;Scar mobilization;Passive range of motion;Dry needling;Energy conservation;Taping;Gait training;Stair training;DME Instruction;Spinal Manipulations;Joint Manipulations, Canalith repositioning procedure.    PT Next Visit Plan Recert next tx. session    PT Home Exercise Plan [email or web address].   HEP: [id number]    Consulted and Agree with  Plan of Care Patient         Pura Spice, PT, DPT # [phone] 05/01/2022, 8:40 PM

## 2022-05-12 ENCOUNTER — Ambulatory Visit: Payer: BC Managed Care – PPO | Admitting: Physical Therapy

## 2022-05-12 DIAGNOSIS — M2560 Stiffness of unspecified joint, not elsewhere classified: Secondary | ICD-10-CM

## 2022-05-12 DIAGNOSIS — M79601 Pain in right arm: Secondary | ICD-10-CM | POA: Diagnosis not present

## 2022-05-12 DIAGNOSIS — M6281 Muscle weakness (generalized): Secondary | ICD-10-CM

## 2022-05-12 NOTE — Therapy (Signed)
OUTPATIENT PHYSICAL THERAPY TREATMENT/RECERTIFICATION   Patient Name: Amanda Humphrey MRN: [id number] DOB:10-27-1965, 57 y.o., female Today's Date: 05/13/2022  PCP: Sherlene Shams, MD REFERRING PROVIDER: Sherlene Shams, MD    PT End of Session - 05/12/22 0928     Visit Number 76    Number of Visits 84    Date for PT Re-Evaluation 07/07/22    PT Start Time 0929    PT Stop Time 1017    PT Time Calculation (min) 48 min    Activity Tolerance Patient tolerated treatment well    Behavior During Therapy [facility] for tasks assessed/performed             Past Medical History:  Diagnosis Date   Allergy    Arthritis    Chronic kidney disease    stones   Fibrocystic breast disease 2013   GERD (gastroesophageal reflux disease)    Meniere disease    Migraines    Past Surgical History:  Procedure Laterality Date   ABDOMINAL HYSTERECTOMY  2011   laprascopic supracervical, [place]   APPENDECTOMY  1999   BREAST BIOPSY Right 11/2010    fibrocystic disease, [place]   CESAREAN SECTION     4 total   CHOLECYSTECTOMY  2011   SPINE SURGERY  2021   TONSILLECTOMY AND ADENOIDECTOMY  1975   TUBAL LIGATION     Patient Active Problem List   Diagnosis Date Noted   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   H/O of hemilaminectomy 02/15/2019   Cervical spondylitis with radiculitis 11/17/2018   Fatigue 09/23/2017   History of pneumonia 04/30/2016   Grief reaction 04/30/2016   Vitamin D deficiency 02/22/2015   Hyperlipidemia 01/01/2015   Visit for preventive health examination 12/04/2013   Lower extremity edema 09/02/2013   Essential hypertension 09/02/2013   History of cardiomyopathy 09/02/2013   SVT (supraventricular tachycardia) 09/02/2013   Contrast media allergy 03/10/2013   Fibrocystic breast disease    Morbid obesity 02/05/2012   Meniere disease     REFERRING DIAG:  M79.18 (ICD-10-CM) - Myalgia, other site  Z98.890 (ICD-10-CM) - Other specified postprocedural states  M54.12  (ICD-10-CM) - Radiculopathy, cervical region    THERAPY DIAG:  Joint stiffness of spine  Pain in right arm  Muscle weakness (generalized)  PERTINENT HISTORY: See evaluation  PRECAUTIONS: N/A  SUBJECTIVE:  05/13/2022 Pt. Had a nice work trip to [place] and was really active with walking around city.  Pt. Reports ankle is swollen and is wearing a compression sleeve today.  PAIN:  Are you having pain? Yes: NPRS scale: 4/10 neck/ R arm ("numbness/tingling in pinky/ring finger").   Pain location: Neck Pain description: aching/ stiff/ tight Aggravating factors: prolonged sitting/ activity Relieving factors: stretches/ PT  12/5: Cervical AROM: L rotn.: 70 deg./ R rotn. 51 deg.  (R cervical tightness/ catching).  L UT feels tight.   Cervical flexion 60 deg./ extension 38 deg.  L lat. Flexion 38 deg./ R lat. Flexion 38 deg.  Seated B shoulder flexion/ abduction 4/5 MMT (increase R UT/ shoulder discomfort).  Grip: L 76.7#/ R 52.1#.  Reassessed R hand/finger manual dexterity.     TODAY'S TREATMENT:    05/13/2022  There.ex.:  <5 min.  No charge   L/R shoulder MMT: flexion  Supine cervical rotn./ lateral flexion AROM and manual isometrics 2x each.  Seated thoracic rotation with light OP (no pain).      Manual Therapy:    Supine L/R UT, levator, cervical rotn. Stretches 4x each. Supine  cervical grade II-III distraction and suboccipital release - 30 s hold x 3.    L/R shoulder AAROM to end-range flexion/ horizontal abduction/ ER/ IR/ abduction 5x (slight OP).  No change in R UE radicular symptoms.    Trigger Point Dry Needling (TDN)   Education performed with patient regarding potential benefit of TDN. Reviewed precautions and risks with patient. Reviewed special precautions/risks over lung fields which include pneumothorax. Reviewed signs and symptoms of pneumothorax and advised pt to go to ER immediately if these symptoms develop advise them of dry needling treatment. Pt provided verbal  consent to treatment. TDN performed to L and R UT/ mid-cervical paraspinals with 4, 0.25 x 40 L-type single needle placements on each side with 1 local twitch response (LTR) in R UT region. Pistoning technique utilized. Improved pain-free motion following intervention.  Good tx. Tolerance. Applied Biofreeze to upper back/ neck after tx.   Supine/ prone STM to B UT/upper thoracic/ cervical paraspinals.   No Hypervolt today.      PATIENT EDUCATION: Education details: HEP Person educated: Patient Education method: Explanation, Facilities manager, and Handouts Education comprehension: verbalized understanding and returned demonstration   HOME EXERCISE PROGRAM: [id number].  HEP: [id number]             PT LONG TERM GOAL 1    Title Pt will improve worse cervical pain by at least 3 NPS points to allow increased tolerance during work and home related ADLs.     Baseline NPS: 8/10 current, 7/10 best, 10/10 Worse. 10/20: Worse pain 7/10.  10/12: 5/10 neck pain at worst (improving)- stress related.  4/15: 4/10    Time 8     Period Weeks     Status Partially Met     Target Date 07/07/2022          PT LONG TERM GOAL 2    Title Pt will improve bilat UE MMT by atleast 1/2 grade to facilitate greater functional abilities with ADLs related to caregiving.     Baseline UE MMT R/L:   Shoulder Flexion: 4/4-  Shoulder abduction: 4/4-  Shoulder ER: 4/4  Shoulder IR: 4/4  Elbow ext: 5/5   Eblow flex: 5/5 10/20: UE MMT R/L:   Shoulder Flexion: 4/4  Shoulder abduction: 4/4 Shoulder ER: 4/4  Shoulder IR: 4/4  Elbow ext: 5/5   Elbow flex: 5/5.  10/12: B shoulder flexion 4/5 MMT, abduction 4/5 MMT biceps/triceps 5/5 MMT.     Time 8     Period Weeks     Status Partially Met     Target Date 07/07/2022           PT LONG TERM GOAL 3    Title Pt will improve functional R hand strength equal to L to promote return of hand useage during grooming and feeding.     Baseline Grip R/L: 76.1 / 33.9    Lateral prehension grip R/L: 14 /  4 10/20: Grip R/L: 62.0 / 41.4    Lateral prehension grip R/L: 13 / 6.  1/12:  R 69#/ L 49.8#     Time 8     Period Weeks     Status Partially Met     Target Date 07/07/2022            Plan -     Clinical Impression Statement Pt. Reports muscle tightness in B UT and cervical musculature.  Pt. C/o R UE symptoms into R hand/fingers.  Good cervical/ B shoulder ROM with no increase pain  with OP. Tx. focus on manual stretches/ STM to cervical spine/ upper back and HEP to improve to focus on UE strength training.  Decrease noted in overall UT  trigger points and good tolerance to TDN/ STM.  Pt. Has full cervical rotn. To L/R and muscle activation during isometrics.  Pt. understands current HEP and importance of being aware of proper posture/ body mechanics with work-related tasks and household activities. Pt. Will focus on current UE ex./ cervical stretches to increase pain-free mobility.  Pt. Instructed to contact PT if any questions or concerns.     Personal Factors and Comorbidities Comorbidity 2;Profession;Past/Current Experience    Comorbidities Hypertension, Obesity    Examination-Activity Limitations Squat;Locomotion Level    Examination-Participation Restrictions Occupation;Cleaning;Laundry    Stability/Clinical Decision Making Evolving/Moderate complexity    Clinical Decision Making Moderate    Rehab Potential Good    PT Frequency 1-2x/biweekly   PT Duration 8 weeks    PT Treatment/Interventions ADLs/Self Care Home Management;Aquatic Therapy;Biofeedback;Cryotherapy;Electrical Stimulation;Moist Heat;Ultrasound;Functional mobility training;Therapeutic activities;Therapeutic exercise;Balance training;Neuromuscular re-education;Patient/family education;Manual techniques;Scar mobilization;Passive range of motion;Dry needling;Energy conservation;Taping;Gait training;Stair training;DME Instruction;Spinal Manipulations;Joint Manipulations, Canalith repositioning procedure.    PT Next Visit Plan  Added prone scap./ trap. Ex.    PT Home Exercise Plan [id number].   HEP: [id number]    Consulted and Agree with Plan of Care Patient         Cammie Mcgee, PT, DPT # [phone] 05/13/2022, 8:40 PM

## 2022-05-22 ENCOUNTER — Ambulatory Visit: Payer: BC Managed Care – PPO | Admitting: Physical Therapy

## 2022-05-22 DIAGNOSIS — M79601 Pain in right arm: Secondary | ICD-10-CM | POA: Diagnosis not present

## 2022-05-22 DIAGNOSIS — M2560 Stiffness of unspecified joint, not elsewhere classified: Secondary | ICD-10-CM | POA: Diagnosis not present

## 2022-05-22 DIAGNOSIS — M6281 Muscle weakness (generalized): Secondary | ICD-10-CM

## 2022-05-27 ENCOUNTER — Ambulatory Visit: Payer: BC Managed Care – PPO | Admitting: Physical Therapy

## 2022-05-27 DIAGNOSIS — M2560 Stiffness of unspecified joint, not elsewhere classified: Secondary | ICD-10-CM | POA: Diagnosis not present

## 2022-05-27 DIAGNOSIS — M79601 Pain in right arm: Secondary | ICD-10-CM | POA: Diagnosis not present

## 2022-05-27 DIAGNOSIS — M6281 Muscle weakness (generalized): Secondary | ICD-10-CM

## 2022-05-27 NOTE — Therapy (Signed)
OUTPATIENT PHYSICAL THERAPY TREATMENT   Patient Name: Amanda Humphrey MRN: [id number] DOB:05/16/1965, 57 y.o., female Today's Date: 05/27/2022  PCP: Sherlene Shams, MD REFERRING PROVIDER: Sherlene Shams, MD    PT End of Session - 05/27/22 1156     Visit Number 77    Number of Visits 84    Date for PT Re-Evaluation 07/07/22    PT Start Time 0818    PT Stop Time 0901    PT Time Calculation (min) 43 min    Activity Tolerance Patient tolerated treatment well    Behavior During Therapy [facility] for tasks assessed/performed             Past Medical History:  Diagnosis Date   Allergy    Arthritis    Chronic kidney disease    stones   Fibrocystic breast disease 2013   GERD (gastroesophageal reflux disease)    Meniere disease    Migraines    Past Surgical History:  Procedure Laterality Date   ABDOMINAL HYSTERECTOMY  2011   laprascopic supracervical, [place]   APPENDECTOMY  1999   BREAST BIOPSY Right 11/2010    fibrocystic disease, [place]   CESAREAN SECTION     4 total   CHOLECYSTECTOMY  2011   SPINE SURGERY  2021   TONSILLECTOMY AND ADENOIDECTOMY  1975   TUBAL LIGATION     Patient Active Problem List   Diagnosis Date Noted   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   H/O of hemilaminectomy 02/15/2019   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   Fatigue 09/23/2017   History of pneumonia 04/30/2016   Grief reaction 04/30/2016   Vitamin D deficiency 02/22/2015   Hyperlipidemia 01/01/2015   Visit for preventive health examination 12/04/2013   Lower extremity edema 09/02/2013   Essential hypertension 09/02/2013   History of cardiomyopathy 09/02/2013   SVT (supraventricular tachycardia) 09/02/2013   Contrast media allergy 03/10/2013   Fibrocystic breast disease    Morbid obesity (HCC) 02/05/2012   Meniere disease     REFERRING DIAG:  M79.18 (ICD-10-CM) - Myalgia, other site  Z98.890 (ICD-10-CM) - Other specified postprocedural states  M54.12  (ICD-10-CM) - Radiculopathy, cervical region    THERAPY DIAG:  Joint stiffness of spine  Pain in right arm  Muscle weakness (generalized)  PERTINENT HISTORY: See evaluation  PRECAUTIONS: N/A  SUBJECTIVE:  05/27/2022  Pt. C/o increase R upper neck pain and had difficulty sleeping last night.  Pt. Has work trip to [place] this afternoon.      PAIN:  Are you having pain? Yes: NPRS scale: 4/10 neck/ R arm ("numbness/tingling in pinky/ring finger").   Pain location: Neck Pain description: aching/ stiff/ tight Aggravating factors: prolonged sitting/ activity Relieving factors: stretches/ PT  12/5: Cervical AROM: L rotn.: 70 deg./ R rotn. 51 deg.  (R cervical tightness/ catching).  L UT feels tight.   Cervical flexion 60 deg./ extension 38 deg.  L lat. Flexion 38 deg./ R lat. Flexion 38 deg.  Seated B shoulder flexion/ abduction 4/5 MMT (increase R UT/ shoulder discomfort).  Grip: L 76.7#/ R 52.1#.  Reassessed R hand/finger manual dexterity.     TODAY'S TREATMENT:    05/27/2022  There.ex.:  <5 min.  No charge   Seated cervical/ R shoulder AROM (all planes) in pain tolerable range.      Manual Therapy:    Supine L/R UT, levator, cervical rotn. Stretches 5x each. Supine cervical grade II-III distraction and suboccipital release - 30 s hold x 3.  STM  to R cervical paraspinals/ upper trap. Musculature in supine.    Trigger Point Dry Needling (TDN)   Education performed with patient regarding potential benefit of TDN. Reviewed precautions and risks with patient. Reviewed special precautions/risks over lung fields which include pneumothorax. Reviewed signs and symptoms of pneumothorax and advised pt to go to ER immediately if these symptoms develop advise them of dry needling treatment. Pt provided verbal consent to treatment. TDN performed to R UT/ mid-cervical paraspinals with 4, 0.25 x 40 L-type single needle placements on each side with 1 local twitch response (LTR) in R UT region.  Pistoning technique utilized. Improved pain-free motion following intervention.  Good tx. Tolerance. Applied Biofreeze to upper back/ neck after tx.   Prone/seated STM to B UT/upper thoracic/ cervical paraspinals.   Use of hypervolt to B UT musculature (good tx. Tolerance)      PATIENT EDUCATION: Education details: HEP Person educated: Patient Education method: Explanation, Demonstration, and Handouts Education comprehension: verbalized understanding and returned demonstration   HOME EXERCISE PROGRAM: [id number].  HEP: [id number]             PT LONG TERM GOAL 1    Title Pt will improve worse cervical pain by at least 3 NPS points to allow increased tolerance during work and home related ADLs.     Baseline NPS: 8/10 current, 7/10 best, 10/10 Worse. 10/20: Worse pain 7/10.  10/12: 5/10 neck pain at worst (improving)- stress related.  4/15: 4/10    Time 8     Period Weeks     Status Partially Met     Target Date 07/07/2022          PT LONG TERM GOAL 2    Title Pt will improve bilat UE MMT by atleast 1/2 grade to facilitate greater functional abilities with ADLs related to caregiving.     Baseline UE MMT R/L:   Shoulder Flexion: 4/4-  Shoulder abduction: 4/4-  Shoulder ER: 4/4  Shoulder IR: 4/4  Elbow ext: 5/5   Eblow flex: 5/5 10/20: UE MMT R/L:   Shoulder Flexion: 4/4  Shoulder abduction: 4/4 Shoulder ER: 4/4  Shoulder IR: 4/4  Elbow ext: 5/5   Elbow flex: 5/5.  10/12: B shoulder flexion 4/5 MMT, abduction 4/5 MMT biceps/triceps 5/5 MMT.     Time 8     Period Weeks     Status Partially Met     Target Date 07/07/2022           PT LONG TERM GOAL 3    Title Pt will improve functional R hand strength equal to L to promote return of hand useage during grooming and feeding.     Baseline Grip R/L: 76.1 / 33.9    Lateral prehension grip R/L: 14 / 4 10/20: Grip R/L: 62.0 / 41.4    Lateral prehension grip R/L: 13 / 6.  1/12:  R 69#/ L 49.8#     Time 8     Period Weeks     Status Partially Met      Target Date 07/07/2022            Plan -     Clinical Impression Statement Pt. Presents with good cervical/ B shoulder ROM with c/o R upper/mid-cervical discomfort/ tenderness.  Tx. focus on manual stretches/ STM to cervical spine/ upper back and HEP to improve to focus on UE strength training.  Decrease noted in overall UT  trigger points and good tolerance to TDN/ STM.  Pt. Has  full cervical rotn. To L/R.  Pt. understands current HEP and importance of being aware of proper posture/ body mechanics with work-related tasks and household activities. Pt. Will focus on current UE ex./ cervical stretches to increase pain-free mobility.  Pt. Instructed to contact PT if any questions or concerns.     Personal Factors and Comorbidities Comorbidity 2;Profession;Past/Current Experience    Comorbidities Hypertension, Obesity    Examination-Activity Limitations Squat;Locomotion Level    Examination-Participation Restrictions Occupation;Cleaning;Laundry    Stability/Clinical Decision Making Evolving/Moderate complexity    Clinical Decision Making Moderate    Rehab Potential Good    PT Frequency 1-2x/biweekly   PT Duration 8 weeks    PT Treatment/Interventions ADLs/Self Care Home Management;Aquatic Therapy;Biofeedback;Cryotherapy;Electrical Stimulation;Moist Heat;Ultrasound;Functional mobility training;Therapeutic activities;Therapeutic exercise;Balance training;Neuromuscular re-education;Patient/family education;Manual techniques;Scar mobilization;Passive range of motion;Dry needling;Energy conservation;Taping;Gait training;Stair training;DME Instruction;Spinal Manipulations;Joint Manipulations, Canalith repositioning procedure.    PT Next Visit Plan Add prone scap./ trap. Ex.    PT Home Exercise Plan [id number].   HEP: [id number]    Consulted and Agree with Plan of Care Patient         Cammie Mcgee, PT, DPT # [phone] 05/27/2022, 8:40 PM

## 2022-05-27 NOTE — Therapy (Signed)
OUTPATIENT PHYSICAL THERAPY TREATMENT   Patient Name: Amanda Humphrey MRN: [id number] DOB:06/02/1965, 57 y.o., female Today's Date: 05/22/22  PCP: Sherlene Shams, MD REFERRING PROVIDER: Sherlene Shams, MD    PT End of Session - 05/27/22 1156     Visit Number 77    Number of Visits 84    Date for PT Re-Evaluation 07/07/22    PT Start Time 0818    PT Stop Time 0901    PT Time Calculation (min) 43 min    Activity Tolerance Patient tolerated treatment well    Behavior During Therapy [facility] for tasks assessed/performed             Past Medical History:  Diagnosis Date   Allergy    Arthritis    Chronic kidney disease    stones   Fibrocystic breast disease 2013   GERD (gastroesophageal reflux disease)    Meniere disease    Migraines    Past Surgical History:  Procedure Laterality Date   ABDOMINAL HYSTERECTOMY  2011   laprascopic supracervical, [place]   APPENDECTOMY  1999   BREAST BIOPSY Right 11/2010    fibrocystic disease, [place]   CESAREAN SECTION     4 total   CHOLECYSTECTOMY  2011   SPINE SURGERY  2021   TONSILLECTOMY AND ADENOIDECTOMY  1975   TUBAL LIGATION     Patient Active Problem List   Diagnosis Date Noted   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   H/O of hemilaminectomy 02/15/2019   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   Fatigue 09/23/2017   History of pneumonia 04/30/2016   Grief reaction 04/30/2016   Vitamin D deficiency 02/22/2015   Hyperlipidemia 01/01/2015   Visit for preventive health examination 12/04/2013   Lower extremity edema 09/02/2013   Essential hypertension 09/02/2013   History of cardiomyopathy 09/02/2013   SVT (supraventricular tachycardia) 09/02/2013   Contrast media allergy 03/10/2013   Fibrocystic breast disease    Morbid obesity (HCC) 02/05/2012   Meniere disease     REFERRING DIAG:  M79.18 (ICD-10-CM) - Myalgia, other site  Z98.890 (ICD-10-CM) - Other specified postprocedural states  M54.12  (ICD-10-CM) - Radiculopathy, cervical region    THERAPY DIAG:  Joint stiffness of spine  Pain in right arm  Muscle weakness (generalized)  PERTINENT HISTORY: See evaluation  PRECAUTIONS: N/A  SUBJECTIVE:  05/22/22 Pt. C/o R upper neck pain and L shoulder blade "catching" with movement.  Pt. Has work trip to [place] next week.    PAIN:  Are you having pain? Yes: NPRS scale: 4/10 neck/ R arm ("numbness/tingling in pinky/ring finger").   Pain location: Neck Pain description: aching/ stiff/ tight Aggravating factors: prolonged sitting/ activity Relieving factors: stretches/ PT  12/5: Cervical AROM: L rotn.: 70 deg./ R rotn. 51 deg.  (R cervical tightness/ catching).  L UT feels tight.   Cervical flexion 60 deg./ extension 38 deg.  L lat. Flexion 38 deg./ R lat. Flexion 38 deg.  Seated B shoulder flexion/ abduction 4/5 MMT (increase R UT/ shoulder discomfort).  Grip: L 76.7#/ R 52.1#.  Reassessed R hand/finger manual dexterity.     TODAY'S TREATMENT:    05/22/22  There.ex.:  <5 min.  No charge   Seated cervical/ B shoulder AROM (all planes) in pain tolerable range.      Manual Therapy:    Supine L/R UT, levator, cervical rotn. Stretches 5x each. Supine cervical grade II-III distraction and suboccipital release - 30 s hold x 3.  STM to R cervical  paraspinals/ upper trap. Musculature in supine.    Trigger Point Dry Needling (TDN)   Education performed with patient regarding potential benefit of TDN. Reviewed precautions and risks with patient. Reviewed special precautions/risks over lung fields which include pneumothorax. Reviewed signs and symptoms of pneumothorax and advised pt to go to ER immediately if these symptoms develop advise them of dry needling treatment. Pt provided verbal consent to treatment. TDN performed to L and R UT/ mid-cervical paraspinals with 3, 0.25 x 40 L-type single needle placements on each side with 1 local twitch response (LTR) in R UT region.  Pistoning technique utilized. Improved pain-free motion following intervention.  Good tx. Tolerance. Applied Biofreeze to upper back/ neck after tx.   Seated STM to B UT/upper thoracic/ cervical paraspinals.   No Hypervolt today.      PATIENT EDUCATION: Education details: HEP Person educated: Patient Education method: Explanation, Facilities manager, and Handouts Education comprehension: verbalized understanding and returned demonstration   HOME EXERCISE PROGRAM: [id number].  HEP: [id number]             PT LONG TERM GOAL 1    Title Pt will improve worse cervical pain by at least 3 NPS points to allow increased tolerance during work and home related ADLs.     Baseline NPS: 8/10 current, 7/10 best, 10/10 Worse. 10/20: Worse pain 7/10.  10/12: 5/10 neck pain at worst (improving)- stress related.  4/15: 4/10    Time 8     Period Weeks     Status Partially Met     Target Date 07/07/2022          PT LONG TERM GOAL 2    Title Pt will improve bilat UE MMT by atleast 1/2 grade to facilitate greater functional abilities with ADLs related to caregiving.     Baseline UE MMT R/L:   Shoulder Flexion: 4/4-  Shoulder abduction: 4/4-  Shoulder ER: 4/4  Shoulder IR: 4/4  Elbow ext: 5/5   Eblow flex: 5/5 10/20: UE MMT R/L:   Shoulder Flexion: 4/4  Shoulder abduction: 4/4 Shoulder ER: 4/4  Shoulder IR: 4/4  Elbow ext: 5/5   Elbow flex: 5/5.  10/12: B shoulder flexion 4/5 MMT, abduction 4/5 MMT biceps/triceps 5/5 MMT.     Time 8     Period Weeks     Status Partially Met     Target Date 07/07/2022           PT LONG TERM GOAL 3    Title Pt will improve functional R hand strength equal to L to promote return of hand useage during grooming and feeding.     Baseline Grip R/L: 76.1 / 33.9    Lateral prehension grip R/L: 14 / 4 10/20: Grip R/L: 62.0 / 41.4    Lateral prehension grip R/L: 13 / 6.  1/12:  R 69#/ L 49.8#     Time 8     Period Weeks     Status Partially Met     Target Date 07/07/2022             Plan -     Clinical Impression Statement Pt. Reports muscle tightness in B UT and cervical musculature.  Pt. C/o R UE symptoms into R hand/fingers.  Good cervical/ B shoulder ROM with no increase pain with OP. Tx. focus on manual stretches/ STM to cervical spine/ upper back and HEP to improve to focus on UE strength training.  Decrease noted in overall UT  trigger points and  good tolerance to TDN/ STM.  Pt. Has full cervical rotn. To L/R and muscle activation during isometrics.  Pt. understands current HEP and importance of being aware of proper posture/ body mechanics with work-related tasks and household activities. Pt. Will focus on current UE ex./ cervical stretches to increase pain-free mobility.  Pt. Instructed to contact PT if any questions or concerns.     Personal Factors and Comorbidities Comorbidity 2;Profession;Past/Current Experience    Comorbidities Hypertension, Obesity    Examination-Activity Limitations Squat;Locomotion Level    Examination-Participation Restrictions Occupation;Cleaning;Laundry    Stability/Clinical Decision Making Evolving/Moderate complexity    Clinical Decision Making Moderate    Rehab Potential Good    PT Frequency 1-2x/biweekly   PT Duration 8 weeks    PT Treatment/Interventions ADLs/Self Care Home Management;Aquatic Therapy;Biofeedback;Cryotherapy;Electrical Stimulation;Moist Heat;Ultrasound;Functional mobility training;Therapeutic activities;Therapeutic exercise;Balance training;Neuromuscular re-education;Patient/family education;Manual techniques;Scar mobilization;Passive range of motion;Dry needling;Energy conservation;Taping;Gait training;Stair training;DME Instruction;Spinal Manipulations;Joint Manipulations, Canalith repositioning procedure.    PT Next Visit Plan Add prone scap./ trap. Ex.    PT Home Exercise Plan [id number].   HEP: [id number]    Consulted and Agree with Plan of Care Patient         Cammie Mcgee, PT, DPT # [phone] 05/27/2022, 8:40 PM

## 2022-06-05 ENCOUNTER — Ambulatory Visit: Payer: BC Managed Care – PPO | Attending: Internal Medicine | Admitting: Physical Therapy

## 2022-06-05 DIAGNOSIS — M79601 Pain in right arm: Secondary | ICD-10-CM | POA: Diagnosis not present

## 2022-06-05 DIAGNOSIS — M6281 Muscle weakness (generalized): Secondary | ICD-10-CM | POA: Insufficient documentation

## 2022-06-05 DIAGNOSIS — M2560 Stiffness of unspecified joint, not elsewhere classified: Secondary | ICD-10-CM

## 2022-06-12 ENCOUNTER — Ambulatory Visit: Payer: BC Managed Care – PPO | Admitting: Physical Therapy

## 2022-06-12 DIAGNOSIS — M6281 Muscle weakness (generalized): Secondary | ICD-10-CM | POA: Diagnosis not present

## 2022-06-12 DIAGNOSIS — M79601 Pain in right arm: Secondary | ICD-10-CM

## 2022-06-12 DIAGNOSIS — M2560 Stiffness of unspecified joint, not elsewhere classified: Secondary | ICD-10-CM | POA: Diagnosis not present

## 2022-06-14 NOTE — Therapy (Signed)
OUTPATIENT PHYSICAL THERAPY TREATMENT Physical Therapy Progress Note  Dates of reporting period  03/06/22   to   06/12/22    Patient Name: Amanda Humphrey MRN: [id number] DOB:April 22, 1965, 57 y.o., female Today's Date: 06/12/2022  PCP: Sherlene Shams, MD REFERRING PROVIDER: Sherlene Shams, MD    PT End of Session - 06/14/22 2043     Visit Number 80    Number of Visits 84    Date for PT Re-Evaluation 07/07/22    PT Start Time 0815    PT Stop Time 0902    PT Time Calculation (min) 47 min    Activity Tolerance Patient tolerated treatment well    Behavior During Therapy [facility] for tasks assessed/performed             Past Medical History:  Diagnosis Date   Allergy    Arthritis    Chronic kidney disease    stones   Fibrocystic breast disease 2013   GERD (gastroesophageal reflux disease)    Meniere disease    Migraines    Past Surgical History:  Procedure Laterality Date   ABDOMINAL HYSTERECTOMY  2011   laprascopic supracervical, [place]   APPENDECTOMY  1999   BREAST BIOPSY Right 11/2010    fibrocystic disease, [place]   CESAREAN SECTION     4 total   CHOLECYSTECTOMY  2011   SPINE SURGERY  2021   TONSILLECTOMY AND ADENOIDECTOMY  1975   TUBAL LIGATION     Patient Active Problem List   Diagnosis Date Noted   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   H/O of hemilaminectomy 02/15/2019   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   Fatigue 09/23/2017   History of pneumonia 04/30/2016   Grief reaction 04/30/2016   Vitamin D deficiency 02/22/2015   Hyperlipidemia 01/01/2015   Visit for preventive health examination 12/04/2013   Lower extremity edema 09/02/2013   Essential hypertension 09/02/2013   History of cardiomyopathy 09/02/2013   SVT (supraventricular tachycardia) 09/02/2013   Contrast media allergy 03/10/2013   Fibrocystic breast disease    Morbid obesity (HCC) 02/05/2012   Meniere disease     REFERRING DIAG:  M79.18 (ICD-10-CM) - Myalgia,  other site  Z98.890 (ICD-10-CM) - Other specified postprocedural states  M54.12 (ICD-10-CM) - Radiculopathy, cervical region    THERAPY DIAG:  Joint stiffness of spine  Pain in right arm  Muscle weakness (generalized)  PERTINENT HISTORY: See evaluation  PRECAUTIONS: N/A  SUBJECTIVE:  06/12/2022  Pt. Continues to reports significant tightness in upper back/ neck prior to tx.  Pt. Remains busy at work with high stress.  Pt. Understands her work situation is contributing to her neck/ pain symptoms.    PAIN:  Are you having pain? Yes: NPRS scale: 4/10 neck/ R arm ("numbness/tingling in pinky/ring finger").   Pain location: Neck Pain description: aching/ stiff/ tight Aggravating factors: prolonged sitting/ activity Relieving factors: stretches/ PT  12/5: Cervical AROM: L rotn.: 70 deg./ R rotn. 51 deg.  (R cervical tightness/ catching).  L UT feels tight.   Cervical flexion 60 deg./ extension 38 deg.  L lat. Flexion 38 deg./ R lat. Flexion 38 deg.  Seated B shoulder flexion/ abduction 4/5 MMT (increase R UT/ shoulder discomfort).  Grip: L 76.7#/ R 52.1#.  Reassessed R hand/finger manual dexterity.     TODAY'S TREATMENT:    06/12/2022  There.ex.:  <5 min.  No charge   Seated cervical/ R shoulder AROM (all planes) in pain tolerable range.      MH  to neck in supine with cervical AROM/ prior to manual tx.   Manual Therapy:    Supine L/R UT, levator, cervical rotn. Stretches 5x each. Supine cervical grade II-III distraction and suboccipital release - 30 s hold x 3.  STM to R cervical paraspinals/ upper trap. Musculature in supine.    Trigger Point Dry Needling (TDN)   Education performed with patient regarding potential benefit of TDN. Reviewed precautions and risks with patient. Reviewed special precautions/risks over lung fields which include pneumothorax. Reviewed signs and symptoms of pneumothorax and advised pt to go to ER immediately if these symptoms develop advise them of dry  needling treatment. Pt provided verbal consent to treatment. TDN performed to R UT/ mid-cervical paraspinals with 5, 0.25 x 40 L-type single needle placements on each side with 2 local twitch response (LTR) in R UT region. Pistoning technique utilized. Improved pain-free motion following intervention.  Good tx. Tolerance. Applied Biofreeze to upper back/ neck after tx.   Prone/seated STM to B UT/upper thoracic/ cervical paraspinals.   Trigger point release technique to upper neck tightness/ subocciput.    PATIENT EDUCATION: Education details: HEP Person educated: Patient Education method: Explanation, Facilities manager, and Handouts Education comprehension: verbalized understanding and returned demonstration   HOME EXERCISE PROGRAM: [id number].  HEP: [id number]             PT LONG TERM GOAL 1    Title Pt will improve worse cervical pain by at least 3 NPS points to allow increased tolerance during work and home related ADLs.     Baseline NPS: 8/10 current, 7/10 best, 10/10 Worse. 10/20: Worse pain 7/10.  10/12: 5/10 neck pain at worst (improving)- stress related.  4/15: 4/10    Time 8     Period Weeks     Status Partially Met     Target Date 07/07/2022          PT LONG TERM GOAL 2    Title Pt will improve bilat UE MMT by atleast 1/2 grade to facilitate greater functional abilities with ADLs related to caregiving.     Baseline UE MMT R/L:   Shoulder Flexion: 4/4-  Shoulder abduction: 4/4-  Shoulder ER: 4/4  Shoulder IR: 4/4  Elbow ext: 5/5   Eblow flex: 5/5 10/20: UE MMT R/L:   Shoulder Flexion: 4/4  Shoulder abduction: 4/4 Shoulder ER: 4/4  Shoulder IR: 4/4  Elbow ext: 5/5   Elbow flex: 5/5.  10/12: B shoulder flexion 4/5 MMT, abduction 4/5 MMT biceps/triceps 5/5 MMT.     Time 8     Period Weeks     Status Partially Met     Target Date 07/07/2022           PT LONG TERM GOAL 3    Title Pt will improve functional R hand strength equal to L to promote return of hand useage during grooming and  feeding.     Baseline Grip R/L: 76.1 / 33.9    Lateral prehension grip R/L: 14 / 4 10/20: Grip R/L: 62.0 / 41.4    Lateral prehension grip R/L: 13 / 6.  1/12:  R 69#/ L 49.8#     Time 8     Period Weeks     Status Partially Met     Target Date 07/07/2022            Plan -     Clinical Impression Statement Pt. Presents with good cervical/ B shoulder ROM with c/o R upper/mid-cervical discomfort/ tenderness.  Tx. focus on manual stretches/ STM to cervical spine/ upper back and HEP to improve to focus on UE strength training.  Decrease noted in overall UT  trigger points and good tolerance to TDN/ STM.  Pt. Has full cervical rotn. To L/R.  Pt. understands current HEP and importance of being aware of proper posture/ body mechanics with work-related tasks and household activities. Pt. Will focus on current UE ex./ cervical stretches to increase pain-free mobility.  Pt. Instructed to contact PT if any questions or concerns.     Personal Factors and Comorbidities Comorbidity 2;Profession;Past/Current Experience    Comorbidities Hypertension, Obesity    Examination-Activity Limitations Squat;Locomotion Level    Examination-Participation Restrictions Occupation;Cleaning;Laundry    Stability/Clinical Decision Making Evolving/Moderate complexity    Clinical Decision Making Moderate    Rehab Potential Good    PT Frequency 1-2x/biweekly   PT Duration 8 weeks    PT Treatment/Interventions ADLs/Self Care Home Management;Aquatic Therapy;Biofeedback;Cryotherapy;Electrical Stimulation;Moist Heat;Ultrasound;Functional mobility training;Therapeutic activities;Therapeutic exercise;Balance training;Neuromuscular re-education;Patient/family education;Manual techniques;Scar mobilization;Passive range of motion;Dry needling;Energy conservation;Taping;Gait training;Stair training;DME Instruction;Spinal Manipulations;Joint Manipulations, Canalith repositioning procedure.    PT Next Visit Plan Trigger point release  technique/ Hypervolt   PT Home Exercise Plan [id number].   HEP: [id number]    Consulted and Agree with Plan of Care Patient         Cammie Mcgee, PT, DPT # [phone] 06/14/2022, 8:40 PM

## 2022-06-14 NOTE — Therapy (Signed)
OUTPATIENT PHYSICAL THERAPY TREATMENT   Patient Name: Amanda Humphrey MRN: [id number] DOB:17-Aug-1965, 57 y.o., female Today's Date: 06/05/2022  PCP: Sherlene Shams, MD REFERRING PROVIDER: Sherlene Shams, MD    PT End of Session - 06/14/22 2035     Visit Number 79    Number of Visits 84    Date for PT Re-Evaluation 07/07/22    PT Start Time 0816    PT Stop Time 0900    PT Time Calculation (min) 44 min    Activity Tolerance Patient tolerated treatment well    Behavior During Therapy [facility] for tasks assessed/performed             Past Medical History:  Diagnosis Date   Allergy    Arthritis    Chronic kidney disease    stones   Fibrocystic breast disease 2013   GERD (gastroesophageal reflux disease)    Meniere disease    Migraines    Past Surgical History:  Procedure Laterality Date   ABDOMINAL HYSTERECTOMY  2011   laprascopic supracervical, [place]   APPENDECTOMY  1999   BREAST BIOPSY Right 11/2010    fibrocystic disease, [place]   CESAREAN SECTION     4 total   CHOLECYSTECTOMY  2011   SPINE SURGERY  2021   TONSILLECTOMY AND ADENOIDECTOMY  1975   TUBAL LIGATION     Patient Active Problem List   Diagnosis Date Noted   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   H/O of hemilaminectomy 02/15/2019   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   Fatigue 09/23/2017   History of pneumonia 04/30/2016   Grief reaction 04/30/2016   Vitamin D deficiency 02/22/2015   Hyperlipidemia 01/01/2015   Visit for preventive health examination 12/04/2013   Lower extremity edema 09/02/2013   Essential hypertension 09/02/2013   History of cardiomyopathy 09/02/2013   SVT (supraventricular tachycardia) 09/02/2013   Contrast media allergy 03/10/2013   Fibrocystic breast disease    Morbid obesity (HCC) 02/05/2012   Meniere disease     REFERRING DIAG:  M79.18 (ICD-10-CM) - Myalgia, other site  Z98.890 (ICD-10-CM) - Other specified postprocedural states  M54.12  (ICD-10-CM) - Radiculopathy, cervical region    THERAPY DIAG:  Joint stiffness of spine  Pain in right arm  Muscle weakness (generalized)  PERTINENT HISTORY: See evaluation  PRECAUTIONS: N/A  SUBJECTIVE:  06/05/2022  Pt. C/o increase neck/ UT muscle tightness and pain today.  Pt. Has been busy with work trip/ presentation.  High stress reported with work tasks.   PAIN:  Are you having pain? Yes: NPRS scale: 4/10 neck/ R arm ("numbness/tingling in pinky/ring finger").   Pain location: Neck Pain description: aching/ stiff/ tight Aggravating factors: prolonged sitting/ activity Relieving factors: stretches/ PT  12/5: Cervical AROM: L rotn.: 70 deg./ R rotn. 51 deg.  (R cervical tightness/ catching).  L UT feels tight.   Cervical flexion 60 deg./ extension 38 deg.  L lat. Flexion 38 deg./ R lat. Flexion 38 deg.  Seated B shoulder flexion/ abduction 4/5 MMT (increase R UT/ shoulder discomfort).  Grip: L 76.7#/ R 52.1#.  Reassessed R hand/finger manual dexterity.     TODAY'S TREATMENT:    06/05/2022  There.ex.:  <5 min.  No charge   Seated cervical/ R shoulder AROM (all planes) in pain tolerable range.      Manual Therapy:    Supine L/R UT, levator, cervical rotn. Stretches 5x each. Supine cervical grade II-III distraction and suboccipital release - 30 s hold x 3.  STM to R cervical paraspinals/ upper trap. Musculature in supine.    Trigger Point Dry Needling (TDN)   Education performed with patient regarding potential benefit of TDN. Reviewed precautions and risks with patient. Reviewed special precautions/risks over lung fields which include pneumothorax. Reviewed signs and symptoms of pneumothorax and advised pt to go to ER immediately if these symptoms develop advise them of dry needling treatment. Pt provided verbal consent to treatment. TDN performed to R UT/ mid-cervical paraspinals with 6, 0.25 x 40 L-type single needle placements on each side with 2 local twitch response (LTR)  in R UT region. Pistoning technique utilized. Improved pain-free motion following intervention.  Good tx. Tolerance. Applied Biofreeze to upper back/ neck after tx.   Prone/seated STM to B UT/upper thoracic/ cervical paraspinals.   Use of hypervolt to B UT musculature (good tx. Tolerance)      PATIENT EDUCATION: Education details: HEP Person educated: Patient Education method: Explanation, Demonstration, and Handouts Education comprehension: verbalized understanding and returned demonstration   HOME EXERCISE PROGRAM: [id number].  HEP: [id number]             PT LONG TERM GOAL 1    Title Pt will improve worse cervical pain by at least 3 NPS points to allow increased tolerance during work and home related ADLs.     Baseline NPS: 8/10 current, 7/10 best, 10/10 Worse. 10/20: Worse pain 7/10.  10/12: 5/10 neck pain at worst (improving)- stress related.  4/15: 4/10    Time 8     Period Weeks     Status Partially Met     Target Date 07/07/2022          PT LONG TERM GOAL 2    Title Pt will improve bilat UE MMT by atleast 1/2 grade to facilitate greater functional abilities with ADLs related to caregiving.     Baseline UE MMT R/L:   Shoulder Flexion: 4/4-  Shoulder abduction: 4/4-  Shoulder ER: 4/4  Shoulder IR: 4/4  Elbow ext: 5/5   Eblow flex: 5/5 10/20: UE MMT R/L:   Shoulder Flexion: 4/4  Shoulder abduction: 4/4 Shoulder ER: 4/4  Shoulder IR: 4/4  Elbow ext: 5/5   Elbow flex: 5/5.  10/12: B shoulder flexion 4/5 MMT, abduction 4/5 MMT biceps/triceps 5/5 MMT.     Time 8     Period Weeks     Status Partially Met     Target Date 07/07/2022           PT LONG TERM GOAL 3    Title Pt will improve functional R hand strength equal to L to promote return of hand useage during grooming and feeding.     Baseline Grip R/L: 76.1 / 33.9    Lateral prehension grip R/L: 14 / 4 10/20: Grip R/L: 62.0 / 41.4    Lateral prehension grip R/L: 13 / 6.  1/12:  R 69#/ L 49.8#     Time 8     Period Weeks     Status  Partially Met     Target Date 07/07/2022            Plan -     Clinical Impression Statement Pt. Presents with good cervical/ B shoulder ROM with c/o R upper/mid-cervical discomfort/ tenderness.  Tx. focus on manual stretches/ STM to cervical spine/ upper back and HEP to improve to focus on UE strength training.  Decrease noted in overall UT  trigger points and good tolerance to TDN/ STM.  Pt.  Has full cervical rotn. To L/R.  Pt. understands current HEP and importance of being aware of proper posture/ body mechanics with work-related tasks and household activities. Pt. Will focus on current UE ex./ cervical stretches to increase pain-free mobility.  Pt. Instructed to contact PT if any questions or concerns.     Personal Factors and Comorbidities Comorbidity 2;Profession;Past/Current Experience    Comorbidities Hypertension, Obesity    Examination-Activity Limitations Squat;Locomotion Level    Examination-Participation Restrictions Occupation;Cleaning;Laundry    Stability/Clinical Decision Making Evolving/Moderate complexity    Clinical Decision Making Moderate    Rehab Potential Good    PT Frequency 1-2x/biweekly   PT Duration 8 weeks    PT Treatment/Interventions ADLs/Self Care Home Management;Aquatic Therapy;Biofeedback;Cryotherapy;Electrical Stimulation;Moist Heat;Ultrasound;Functional mobility training;Therapeutic activities;Therapeutic exercise;Balance training;Neuromuscular re-education;Patient/family education;Manual techniques;Scar mobilization;Passive range of motion;Dry needling;Energy conservation;Taping;Gait training;Stair training;DME Instruction;Spinal Manipulations;Joint Manipulations, Canalith repositioning procedure.    PT Next Visit Plan Trigger point release technique/ Hypervolt   PT Home Exercise Plan [id number].   HEP: [id number]    Consulted and Agree with Plan of Care Patient         Cammie Mcgee, PT, DPT # [phone] 06/14/2022, 8:40 PM

## 2022-06-19 ENCOUNTER — Ambulatory Visit: Payer: BC Managed Care – PPO

## 2022-06-19 DIAGNOSIS — M79601 Pain in right arm: Secondary | ICD-10-CM

## 2022-06-19 DIAGNOSIS — M6281 Muscle weakness (generalized): Secondary | ICD-10-CM

## 2022-06-19 DIAGNOSIS — M2560 Stiffness of unspecified joint, not elsewhere classified: Secondary | ICD-10-CM

## 2022-06-19 NOTE — Therapy (Signed)
OUTPATIENT PHYSICAL THERAPY TREATMENT   Patient Name: Amanda Humphrey MRN: [id number] DOB:[birth year], 57 y.o., female Today's Date: 06/12/2022  PCP: Sherlene Shams, MD REFERRING PROVIDER: Sherlene Shams, MD    PT End of Session - 06/19/22 0820     Visit Number 81    Number of Visits 84    Date for PT Re-Evaluation 07/07/22    PT Start Time 0820    PT Stop Time 0900    PT Time Calculation (min) 40 min    Activity Tolerance Patient tolerated treatment well    Behavior During Therapy [facility] for tasks assessed/performed             Past Medical History:  Diagnosis Date   Allergy    Arthritis    Chronic kidney disease    stones   Fibrocystic breast disease 2013   GERD (gastroesophageal reflux disease)    Meniere disease    Migraines    Past Surgical History:  Procedure Laterality Date   ABDOMINAL HYSTERECTOMY  2011   laprascopic supracervical, [place]   APPENDECTOMY  1999   BREAST BIOPSY Right 11/2010    fibrocystic disease, [place]   CESAREAN SECTION     4 total   CHOLECYSTECTOMY  2011   SPINE SURGERY  2021   TONSILLECTOMY AND ADENOIDECTOMY  1975   TUBAL LIGATION     Patient Active Problem List   Diagnosis Date Noted   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   H/O of hemilaminectomy 02/15/2019   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   Fatigue 09/23/2017   History of pneumonia 04/30/2016   Grief reaction 04/30/2016   Vitamin D deficiency 02/22/2015   Hyperlipidemia 01/01/2015   Visit for preventive health examination 12/04/2013   Lower extremity edema 09/02/2013   Essential hypertension 09/02/2013   History of cardiomyopathy 09/02/2013   SVT (supraventricular tachycardia) 09/02/2013   Contrast media allergy 03/10/2013   Fibrocystic breast disease    Morbid obesity (HCC) 02/05/2012   Meniere disease     REFERRING DIAG:  M79.18 (ICD-10-CM) - Myalgia, other site  Z98.890 (ICD-10-CM) - Other specified postprocedural states  M54.12  (ICD-10-CM) - Radiculopathy, cervical region    THERAPY DIAG:  Joint stiffness of spine  Pain in right arm  Muscle weakness (generalized)  PERTINENT HISTORY: See evaluation  PRECAUTIONS: N/A  SUBJECTIVE:  06/19/22  Pt reports 7/10 pain in her R side of neck. Reports high stress and a lot of seated time at a computer for work related tasks.   PAIN:  Are you having pain? Yes: NPRS scale: 7/10 neck/ R arm ("numbness/tingling in pinky/ring finger").   Pain location: Neck Pain description: aching/ stiff/ tight Aggravating factors: prolonged sitting/ activity Relieving factors: stretches/ PT  12/5: Cervical AROM: L rotn.: 70 deg./ R rotn. 51 deg.  (R cervical tightness/ catching).  L UT feels tight.   Cervical flexion 60 deg./ extension 38 deg.  L lat. Flexion 38 deg./ R lat. Flexion 38 deg.  Seated B shoulder flexion/ abduction 4/5 MMT (increase R UT/ shoulder discomfort).  Grip: L 76.7#/ R 52.1#.  Reassessed R hand/finger manual dexterity.     TODAY'S TREATMENT:   06/19/2022  There.ex:   MH to neck in supine:   Cervical retractions: 2x12  Cervical R/L rotations: 2x12/side   AAROM thoracic extension with B shoulder flexion AAROM with dowel: 2x12   B shoulder adduction at midline to abduction: 2x20  Standing T row and low row with blue TB: x20/direction.  Manual Therapy:  20 minutes in supine   Supine STM on R cervical paraspinals, levator scap, SCM at insertion, and occipital release with focus on R > L 15 minutes.   R levator scap stretches: 3x30 sec   R UT stretches with GHJ OP: 3x30 sec       PATIENT EDUCATION: Education details: HEP Person educated: Patient Education method: Explanation, Demonstration, and Handouts Education comprehension: verbalized understanding and returned demonstration   HOME EXERCISE PROGRAM: [id number].  HEP: [id number]             PT LONG TERM GOAL 1    Title Pt will improve worse cervical pain by at least 3 NPS points to allow  increased tolerance during work and home related ADLs.     Baseline NPS: 8/10 current, 7/10 best, 10/10 Worse. 10/20: Worse pain 7/10.  10/12: 5/10 neck pain at worst (improving)- stress related.  4/15: 4/10    Time 8     Period Weeks     Status Partially Met     Target Date 07/07/2022          PT LONG TERM GOAL 2    Title Pt will improve bilat UE MMT by atleast 1/2 grade to facilitate greater functional abilities with ADLs related to caregiving.     Baseline UE MMT R/L:   Shoulder Flexion: 4/4-  Shoulder abduction: 4/4-  Shoulder ER: 4/4  Shoulder IR: 4/4  Elbow ext: 5/5   Eblow flex: 5/5 10/20: UE MMT R/L:   Shoulder Flexion: 4/4  Shoulder abduction: 4/4 Shoulder ER: 4/4  Shoulder IR: 4/4  Elbow ext: 5/5   Elbow flex: 5/5.  10/12: B shoulder flexion 4/5 MMT, abduction 4/5 MMT biceps/triceps 5/5 MMT.     Time 8     Period Weeks     Status Partially Met     Target Date 07/07/2022           PT LONG TERM GOAL 3    Title Pt will improve functional R hand strength equal to L to promote return of hand useage during grooming and feeding.     Baseline Grip R/L: 76.1 / 33.9    Lateral prehension grip R/L: 14 / 4 10/20: Grip R/L: 62.0 / 41.4    Lateral prehension grip R/L: 13 / 6.  1/12:  R 69#/ L 49.8#     Time 8     Period Weeks     Status Partially Met     Target Date 07/07/2022            Plan -     Clinical Impression Statement Continuing PT POC with focus on manual, MH modality, and therex to improve cervical pain. Pt reports reduced pain and tightness post session with good understanding with exercises. Encouraged continuing HEP and POC with pt understanding. Pt will continue to benefit from skilled PT to reduce cervical pain with ADL's and work related tasks.    Pt. Presents with good cervical/ B shoulder ROM with c/o R upper/mid-cervical discomfort/ tenderness.  Tx. focus on manual stretches/ STM to cervical spine/ upper back and HEP to improve to focus on UE strength training.  Decrease  noted in overall UT  trigger points and good tolerance to TDN/ STM.  Pt. Has full cervical rotn. To L/R.  Pt. understands current HEP and importance of being aware of proper posture/ body mechanics with work-related tasks and household activities. Pt. Will focus on current UE ex./ cervical stretches to increase  pain-free mobility.  Pt. Instructed to contact PT if any questions or concerns.     Personal Factors and Comorbidities Comorbidity 2;Profession;Past/Current Experience    Comorbidities Hypertension, Obesity    Examination-Activity Limitations Squat;Locomotion Level    Examination-Participation Restrictions Occupation;Cleaning;Laundry    Stability/Clinical Decision Making Evolving/Moderate complexity    Clinical Decision Making Moderate    Rehab Potential Good    PT Frequency 1-2x/biweekly   PT Duration 8 weeks    PT Treatment/Interventions ADLs/Self Care Home Management;Aquatic Therapy;Biofeedback;Cryotherapy;Electrical Stimulation;Moist Heat;Ultrasound;Functional mobility training;Therapeutic activities;Therapeutic exercise;Balance training;Neuromuscular re-education;Patient/family education;Manual techniques;Scar mobilization;Passive range of motion;Dry needling;Energy conservation;Taping;Gait training;Stair training;DME Instruction;Spinal Manipulations;Joint Manipulations, Canalith repositioning procedure.    PT Next Visit Plan Trigger point release technique/ Hypervolt   PT Home Exercise Plan [id number].   HEP: [id number]    Consulted and Agree with Plan of Care Patient         Delphia Grates. Fairly IV, PT, DPT Physical Therapist- [place]  [facility]  06/19/2022, 9:01 a.m.

## 2022-06-26 ENCOUNTER — Ambulatory Visit: Payer: BC Managed Care – PPO | Admitting: Physical Therapy

## 2022-07-02 ENCOUNTER — Ambulatory Visit: Payer: BC Managed Care – PPO | Attending: Internal Medicine | Admitting: Physical Therapy

## 2022-07-02 DIAGNOSIS — M2560 Stiffness of unspecified joint, not elsewhere classified: Secondary | ICD-10-CM | POA: Diagnosis not present

## 2022-07-02 DIAGNOSIS — M6281 Muscle weakness (generalized): Secondary | ICD-10-CM | POA: Diagnosis not present

## 2022-07-02 DIAGNOSIS — M79601 Pain in right arm: Secondary | ICD-10-CM

## 2022-07-06 ENCOUNTER — Encounter: Payer: Self-pay | Admitting: Physical Therapy

## 2022-07-06 NOTE — Therapy (Signed)
OUTPATIENT PHYSICAL THERAPY TREATMENT   Patient Name: Amanda Humphrey MRN: [id number] DOB:1965/08/08, 57 y.o., female Today's Date: 07/02/2022  PCP: Sherlene Shams, MD REFERRING PROVIDER: Sherlene Shams, MD    PT End of Session - 07/06/22 1101     Visit Number 82    Number of Visits 84    Date for PT Re-Evaluation 07/07/22    PT Start Time 1345    PT Stop Time 1432    PT Time Calculation (min) 47 min    Activity Tolerance Patient tolerated treatment well    Behavior During Therapy [facility] for tasks assessed/performed             Past Medical History:  Diagnosis Date   Allergy    Arthritis    Chronic kidney disease    stones   Fibrocystic breast disease 2013   GERD (gastroesophageal reflux disease)    Meniere disease    Migraines    Past Surgical History:  Procedure Laterality Date   ABDOMINAL HYSTERECTOMY  2011   laprascopic supracervical, [place]   APPENDECTOMY  1999   BREAST BIOPSY Right 11/2010    fibrocystic disease, [place]   CESAREAN SECTION     4 total   CHOLECYSTECTOMY  2011   SPINE SURGERY  2021   TONSILLECTOMY AND ADENOIDECTOMY  1975   TUBAL LIGATION     Patient Active Problem List   Diagnosis Date Noted   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   H/O of hemilaminectomy 02/15/2019   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   Fatigue 09/23/2017   History of pneumonia 04/30/2016   Grief reaction 04/30/2016   Vitamin D deficiency 02/22/2015   Hyperlipidemia 01/01/2015   Visit for preventive health examination 12/04/2013   Lower extremity edema 09/02/2013   Essential hypertension 09/02/2013   History of cardiomyopathy 09/02/2013   SVT (supraventricular tachycardia) 09/02/2013   Contrast media allergy 03/10/2013   Fibrocystic breast disease    Morbid obesity (HCC) 02/05/2012   Meniere disease     REFERRING DIAG:  M79.18 (ICD-10-CM) - Myalgia, other site  Z98.890 (ICD-10-CM) - Other specified postprocedural states  M54.12  (ICD-10-CM) - Radiculopathy, cervical region    THERAPY DIAG:  Joint stiffness of spine  Pain in right arm  Muscle weakness (generalized)  PERTINENT HISTORY: See evaluation  PRECAUTIONS: N/A  SUBJECTIVE:  07/02/22  Pt reports moderate pain in her R side of neck. Pt. Enjoyed some time at the beach with husband last week.  No new complaints.    PAIN:  Are you having pain? Yes: NPRS scale: 5/10 neck/ R arm ("numbness/tingling in pinky/ring finger").   Pain location: Neck Pain description: aching/ stiff/ tight Aggravating factors: prolonged sitting/ activity Relieving factors: stretches/ PT  12/5: Cervical AROM: L rotn.: 70 deg./ R rotn. 51 deg.  (R cervical tightness/ catching).  L UT feels tight.   Cervical flexion 60 deg./ extension 38 deg.  L lat. Flexion 38 deg./ R lat. Flexion 38 deg.  Seated B shoulder flexion/ abduction 4/5 MMT (increase R UT/ shoulder discomfort).  Grip: L 76.7#/ R 52.1#.  Reassessed R hand/finger manual dexterity.     TODAY'S TREATMENT:   07/02/2022     Seated reassessment of cervical AROM/  standing B shoulder AROM (all planes)- WNL.    Manual Therapy:    Supine STM on R cervical paraspinals, levator scap, SCM at insertion, and occipital release with focus on R > L 11 minutes.   Supine R levator scap stretches: 3x30 sec  Supine R UT stretches with GHJ OP: 3x30 sec    TDN to R UT in prone position: 6 needles (40 mm x 0.25) to R UT trigger points and upper cervical paraspinals on R side.  Several muscle fasciculations and excellent tolerance with TDN.    STM to R UT with Biofreeze after tx.  Reassessment of cervical AROM.       PATIENT EDUCATION: Education details: HEP Person educated: Patient Education method: Explanation, Facilities manager, and Handouts Education comprehension: verbalized understanding and returned demonstration   HOME EXERCISE PROGRAM: [id number].  HEP: [id number]             PT LONG TERM GOAL 1    Title Pt will improve worse  cervical pain by at least 3 NPS points to allow increased tolerance during work and home related ADLs.     Baseline NPS: 8/10 current, 7/10 best, 10/10 Worse. 10/20: Worse pain 7/10.  10/12: 5/10 neck pain at worst (improving)- stress related.  4/15: 4/10    Time 8     Period Weeks     Status Partially Met     Target Date 07/07/2022          PT LONG TERM GOAL 2    Title Pt will improve bilat UE MMT by atleast 1/2 grade to facilitate greater functional abilities with ADLs related to caregiving.     Baseline UE MMT R/L:   Shoulder Flexion: 4/4-  Shoulder abduction: 4/4-  Shoulder ER: 4/4  Shoulder IR: 4/4  Elbow ext: 5/5   Eblow flex: 5/5 10/20: UE MMT R/L:   Shoulder Flexion: 4/4  Shoulder abduction: 4/4 Shoulder ER: 4/4  Shoulder IR: 4/4  Elbow ext: 5/5   Elbow flex: 5/5.  10/12: B shoulder flexion 4/5 MMT, abduction 4/5 MMT biceps/triceps 5/5 MMT.     Time 8     Period Weeks     Status Partially Met     Target Date 07/07/2022           PT LONG TERM GOAL 3    Title Pt will improve functional R hand strength equal to L to promote return of hand useage during grooming and feeding.     Baseline Grip R/L: 76.1 / 33.9    Lateral prehension grip R/L: 14 / 4 10/20: Grip R/L: 62.0 / 41.4    Lateral prehension grip R/L: 13 / 6.  1/12:  R 69#/ L 49.8#     Time 8     Period Weeks     Status Partially Met     Target Date 07/07/2022            Plan -     Clinical Impression Statement Continuing PT POC with focus on manual, MH modality, and therex to improve cervical pain. Pt reports reduced pain and tightness post session with good understanding with exercises. Encouraged continuing HEP and POC with pt understanding. Pt will continue to benefit from skilled PT to reduce cervical pain with ADL's and work related tasks.       Personal Factors and Comorbidities Comorbidity 2;Profession;Past/Current Experience    Comorbidities Hypertension, Obesity    Examination-Activity Limitations Squat;Locomotion  Level    Examination-Participation Restrictions Occupation;Cleaning;Laundry    Stability/Clinical Decision Making Evolving/Moderate complexity    Clinical Decision Making Moderate    Rehab Potential Good    PT Frequency 1-2x/biweekly   PT Duration 8 weeks    PT Treatment/Interventions ADLs/Self Care Home Management;Aquatic Therapy;Biofeedback;Cryotherapy;Electrical Stimulation;Moist Heat;Ultrasound;Functional mobility training;Therapeutic activities;Therapeutic exercise;Balance  training;Neuromuscular re-education;Patient/family education;Manual techniques;Scar mobilization;Passive range of motion;Dry needling;Energy conservation;Taping;Gait training;Stair training;DME Instruction;Spinal Manipulations;Joint Manipulations, Canalith repositioning procedure.    PT Next Visit Plan Trigger point release technique/ Hypervolt.  CHECK GOALS   PT Home Exercise Plan [id number].   HEP: [id number]    Consulted and Agree with Plan of Care Patient         Cammie Mcgee, PT, DPT # [phone] Physical Therapist- [facility]  07/06/2022, 9:01 a.m.

## 2022-07-10 ENCOUNTER — Ambulatory Visit: Payer: BC Managed Care – PPO | Admitting: Physical Therapy

## 2022-07-10 ENCOUNTER — Encounter: Payer: Self-pay | Admitting: Physical Therapy

## 2022-07-10 DIAGNOSIS — M2560 Stiffness of unspecified joint, not elsewhere classified: Secondary | ICD-10-CM | POA: Diagnosis not present

## 2022-07-10 DIAGNOSIS — M79601 Pain in right arm: Secondary | ICD-10-CM

## 2022-07-10 DIAGNOSIS — M6281 Muscle weakness (generalized): Secondary | ICD-10-CM | POA: Diagnosis not present

## 2022-07-10 NOTE — Therapy (Signed)
OUTPATIENT PHYSICAL THERAPY TREATMENT/ RECERTIFICATION   Patient Name: Amanda Humphrey MRN: [id number] DOB:[birth year], 58 y.o., female Today's Date: 07/10/2022  PCP: Sherlene Shams, MD REFERRING PROVIDER: Sherlene Shams, MD    PT End of Session - 07/10/22 1258     Visit Number 83    Number of Visits 91    Date for PT Re-Evaluation 09/04/22    PT Start Time 1258    PT Stop Time 1347    PT Time Calculation (min) 49 min    Activity Tolerance Patient tolerated treatment well    Behavior During Therapy [facility] for tasks assessed/performed             Past Medical History:  Diagnosis Date   Allergy    Arthritis    Chronic kidney disease    stones   Fibrocystic breast disease 2013   GERD (gastroesophageal reflux disease)    Meniere disease    Migraines    Past Surgical History:  Procedure Laterality Date   ABDOMINAL HYSTERECTOMY  2011   laprascopic supracervical, [place]   APPENDECTOMY  1999   BREAST BIOPSY Right 11/2010    fibrocystic disease, [place]   CESAREAN SECTION     4 total   CHOLECYSTECTOMY  2011   SPINE SURGERY  2021   TONSILLECTOMY AND ADENOIDECTOMY  1975   TUBAL LIGATION     Patient Active Problem List   Diagnosis Date Noted   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   H/O of hemilaminectomy 02/15/2019   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   Fatigue 09/23/2017   History of pneumonia 04/30/2016   Grief reaction 04/30/2016   Vitamin D deficiency 02/22/2015   Hyperlipidemia 01/01/2015   Visit for preventive health examination 12/04/2013   Lower extremity edema 09/02/2013   Essential hypertension 09/02/2013   History of cardiomyopathy 09/02/2013   SVT (supraventricular tachycardia) 09/02/2013   Contrast media allergy 03/10/2013   Fibrocystic breast disease    Morbid obesity (HCC) 02/05/2012   Meniere disease     REFERRING DIAG:  M79.18 (ICD-10-CM) - Myalgia, other site  Z98.890 (ICD-10-CM) - Other specified postprocedural states   M54.12 (ICD-10-CM) - Radiculopathy, cervical region    THERAPY DIAG:  Joint stiffness of spine  Pain in right arm  Muscle weakness (generalized)  PERTINENT HISTORY: See evaluation  PRECAUTIONS: N/A  SUBJECTIVE:  07/02/22  Pt reports moderate pain in her R side of neck. Pt. Has had a difficult/ stressful week due to the sudden death of her father.    PAIN:  Are you having pain? Yes: NPRS scale: 5/10 neck/ R arm ("numbness/tingling in pinky/ring finger").   Pain location: Neck Pain description: aching/ stiff/ tight Aggravating factors: prolonged sitting/ activity Relieving factors: stretches/ PT  12/5: Cervical AROM: L rotn.: 70 deg./ R rotn. 51 deg.  (R cervical tightness/ catching).  L UT feels tight.   Cervical flexion 60 deg./ extension 38 deg.  L lat. Flexion 38 deg./ R lat. Flexion 38 deg.  Seated B shoulder flexion/ abduction 4/5 MMT (increase R UT/ shoulder discomfort).  Grip: L 76.7#/ R 52.1#.  Reassessed R hand/finger manual dexterity.     TODAY'S TREATMENT:   07/10/2022     Seated reassessment of cervical AROM/  standing B shoulder AROM (all planes)- WNL.   Cervical AROM: L rotn.: 66 deg. (More catching in L upper neck)/ R rotn. 59 deg.  (R UT discomfort).  L UT feels tight.   Cervical flexion 58 deg./ extension 36 deg.  L  lat. Flexion 34 deg. (Tighter feeling)/ R lat. Flexion 40 deg.  Seated B shoulder flexion/ abduction 4/5 MMT (increase R UT/ shoulder discomfort).  Grip: L 76.7# (56.3#)/ R 52.1# (46.2#)- more numbness on a regular basis.  Reassessed R hand/finger manual dexterity.    Manual Therapy:    Supine STM on R cervical paraspinals, levator scap, SCM at insertion, and occipital release with focus on R > L 10 minutes.   Supine R levator scap stretches: 3x30 sec   Supine R UT stretches with GHJ OP: 3x30 sec    TDN to R UT in prone position: 5 needles (40 mm x 0.25) to R UT trigger points and upper cervical paraspinals on R side.  Several muscle fasciculations  and excellent tolerance with TDN.    STM to R UT with Biofreeze after tx.  Reassessment of cervical AROM.       PATIENT EDUCATION: Education details: HEP Person educated: Patient Education method: Explanation, Facilities manager, and Handouts Education comprehension: verbalized understanding and returned demonstration   HOME EXERCISE PROGRAM: [id number].  HEP: [id number]             PT LONG TERM GOAL 1    Title Pt will improve worse cervical pain by at least 3 NPS points to allow increased tolerance during work and home related ADLs.     Baseline NPS: 8/10 current, 7/10 best, 10/10 Worse. 10/20: Worse pain 7/10.  10/12: 5/10 neck pain at worst (improving)- stress related.  4/15: 4/10. 6/13: 5/10    Time 8     Period Weeks     Status Partially Met     Target Date 09/04/2022          PT LONG TERM GOAL 2    Title Pt will improve bilat UE MMT by atleast 1/2 grade to facilitate greater functional abilities with ADLs related to caregiving.     Baseline UE MMT R/L:   Shoulder Flexion: 4/4-  Shoulder abduction: 4/4-  Shoulder ER: 4/4  Shoulder IR: 4/4  Elbow ext: 5/5   Eblow flex: 5/5 10/20: UE MMT R/L:   Shoulder Flexion: 4/4  Shoulder abduction: 4/4 Shoulder ER: 4/4  Shoulder IR: 4/4  Elbow ext: 5/5   Elbow flex: 5/5.  10/12: B shoulder flexion 4/5 MMT, abduction 4/5 MMT biceps/triceps 5/5 MMT.  6/13: B shoulder flexion 4/5 MMT, abduction 4/5 MMT biceps/triceps 5/5 MMT.  IR/ER 4+/5     Time 8     Period Weeks     Status Partially Met     Target Date 09/04/2022           PT LONG TERM GOAL 3    Title Pt will improve functional R hand strength equal to L to promote return of hand useage during grooming and feeding.     Baseline Grip R/L: 76.1 / 33.9    Lateral prehension grip R/L: 14 / 4 10/20: Grip R/L: 62.0 / 41.4    Lateral prehension grip R/L: 13 / 6.  1/12:  R 69#/ L 49.8#.  6/13: see above    Time 8     Period Weeks     Status Partially Met     Target Date 09/04/2022            Plan -      Clinical Impression Statement Continuing PT POC with focus on manual, MH modality, and therex to improve cervical pain. Pt reports reduced pain and tightness post session with good understanding with exercises.  See updated cervical AROM/ strength/ goals. Encouraged continuing HEP and POC with pt understanding. Pt will continue to benefit from skilled PT to reduce cervical pain with ADL's and work related tasks.       Personal Factors and Comorbidities Comorbidity 2;Profession;Past/Current Experience    Comorbidities Hypertension, Obesity    Examination-Activity Limitations Squat;Locomotion Level    Examination-Participation Restrictions Occupation;Cleaning;Laundry    Stability/Clinical Decision Making Evolving/Moderate complexity    Clinical Decision Making Moderate    Rehab Potential Good    PT Frequency 1-2x/biweekly   PT Duration 8 weeks    PT Treatment/Interventions ADLs/Self Care Home Management;Aquatic Therapy;Biofeedback;Cryotherapy;Electrical Stimulation;Moist Heat;Ultrasound;Functional mobility training;Therapeutic activities;Therapeutic exercise;Balance training;Neuromuscular re-education;Patient/family education;Manual techniques;Scar mobilization;Passive range of motion;Dry needling;Energy conservation;Taping;Gait training;Stair training;DME Instruction;Spinal Manipulations;Joint Manipulations, Canalith repositioning procedure.    PT Next Visit Plan Trigger point release technique/ Hypervolt.  Attempt supine TDN to UT musculature.     PT Home Exercise Plan [id number].   HEP: [id number]    Consulted and Agree with Plan of Care Patient         Cammie Mcgee, PT, DPT # [phone] Physical Therapist- [facility]  07/10/2022, 8:29 PM

## 2022-07-16 ENCOUNTER — Ambulatory Visit: Payer: BC Managed Care – PPO

## 2022-07-16 DIAGNOSIS — M6281 Muscle weakness (generalized): Secondary | ICD-10-CM | POA: Diagnosis not present

## 2022-07-16 DIAGNOSIS — M79601 Pain in right arm: Secondary | ICD-10-CM

## 2022-07-16 DIAGNOSIS — M2560 Stiffness of unspecified joint, not elsewhere classified: Secondary | ICD-10-CM

## 2022-07-16 NOTE — Therapy (Signed)
OUTPATIENT PHYSICAL THERAPY TREATMENT/ RECERTIFICATION   Patient Name: Amanda Humphrey MRN: [id number] DOB:July 18, 1965, 57 y.o., female Today's Date: 07/16/2022  PCP: Sherlene Shams, MD REFERRING PROVIDER: Sherlene Shams, MD    PT End of Session - 07/16/22 1313     Visit Number 84    Number of Visits 91    Date for PT Re-Evaluation 09/04/22    PT Start Time 1315    PT Stop Time 1400    PT Time Calculation (min) 45 min    Activity Tolerance Patient tolerated treatment well    Behavior During Therapy [facility] for tasks assessed/performed            Past Medical History:  Diagnosis Date   Allergy    Arthritis    Chronic kidney disease    stones   Fibrocystic breast disease 2013   GERD (gastroesophageal reflux disease)    Meniere disease    Migraines    Past Surgical History:  Procedure Laterality Date   ABDOMINAL HYSTERECTOMY  2011   laprascopic supracervical, [place]   APPENDECTOMY  1999   BREAST BIOPSY Right 11/2010    fibrocystic disease, [place]   CESAREAN SECTION     4 total   CHOLECYSTECTOMY  2011   SPINE SURGERY  2021   TONSILLECTOMY AND ADENOIDECTOMY  1975   TUBAL LIGATION     Patient Active Problem List   Diagnosis Date Noted   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   H/O of hemilaminectomy 02/15/2019   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   Fatigue 09/23/2017   History of pneumonia 04/30/2016   Grief reaction 04/30/2016   Vitamin D deficiency 02/22/2015   Hyperlipidemia 01/01/2015   Visit for preventive health examination 12/04/2013   Lower extremity edema 09/02/2013   Essential hypertension 09/02/2013   History of cardiomyopathy 09/02/2013   SVT (supraventricular tachycardia) 09/02/2013   Contrast media allergy 03/10/2013   Fibrocystic breast disease    Morbid obesity (HCC) 02/05/2012   Meniere disease     REFERRING DIAG:  M79.18 (ICD-10-CM) - Myalgia, other site  Z98.890 (ICD-10-CM) - Other specified postprocedural states   M54.12 (ICD-10-CM) - Radiculopathy, cervical region    THERAPY DIAG:  Joint stiffness of spine  Pain in right arm  PERTINENT HISTORY: See evaluation  PRECAUTIONS: N/A  SUBJECTIVE:  6//19  Pt reports moderate pain in her R side of neck.   PAIN:  Are you having pain? Yes: NPRS scale: 5/10 neck/ R arm ("numbness/tingling in pinky/ring finger").   Pain location: Neck Pain description: aching/ stiff/ tight Aggravating factors: prolonged sitting/ activity Relieving factors: stretches/ PT  12/5: Cervical AROM: L rotn.: 70 deg./ R rotn. 51 deg.  (R cervical tightness/ catching).  L UT feels tight.   Cervical flexion 60 deg./ extension 38 deg.  L lat. Flexion 38 deg./ R lat. Flexion 38 deg.  Seated B shoulder flexion/ abduction 4/5 MMT (increase R UT/ shoulder discomfort).  Grip: L 76.7#/ R 52.1#.  Reassessed R hand/finger manual dexterity.     TODAY'S TREATMENT:   07/16/2022   SUBJECTIVE: Patient's father passed last week which has increased stress/upper trap tension over the last week. She has had a deep tissue massage since the last therapy session which was helpful and the therapist focused on her neck and UE's. She scheduled another massage therapy appointment in the future. No specific questions currently.    Manual Therapy:   Supine STM on R cervical paraspinals, levator scap, SCM at insertion, and occipital release  with focus on R > L 10 minutes.  Supine R levator scap stretches: 3x30 sec  Supine R UT stretches with GHJ OP: 3x30 sec   STM to R UT with Biofreeze after tx.  Reassessment of cervical AROM.     Trigger Point Dry Needling (TDN), unbilled Education previously performed with patient regarding potential benefit of TDN. Previoulsy reviewed precautions and risks with patient. Pt provided verbal consent to treatment. In prone using clean technique TDN performed to R upper trap with 3, 0.25 x 40 single needle placements with local twitch response (LTR). Also performed TDN to  R subocciptals and R upper cervical (C3) paraspinals with 2, 0.25 x 40 single needle placements. Pistoning technique utilized. Pt reports soreness following intervention;   PATIENT EDUCATION: Education details: Plan of care Person educated: Patient Education method: Explanation, Demonstration, and Handouts Education comprehension: verbalized understanding and returned demonstration   HOME EXERCISE PROGRAM: [id number].  HEP: [id number]             PT LONG TERM GOAL 1    Title Pt will improve worse cervical pain by at least 3 NPS points to allow increased tolerance during work and home related ADLs.     Baseline NPS: 8/10 current, 7/10 best, 10/10 Worse. 10/20: Worse pain 7/10.  10/12: 5/10 neck pain at worst (improving)- stress related.  4/15: 4/10. 6/13: 5/10    Time 8     Period Weeks     Status Partially Met     Target Date 09/04/2022          PT LONG TERM GOAL 2    Title Pt will improve bilat UE MMT by atleast 1/2 grade to facilitate greater functional abilities with ADLs related to caregiving.     Baseline UE MMT R/L:   Shoulder Flexion: 4/4-  Shoulder abduction: 4/4-  Shoulder ER: 4/4  Shoulder IR: 4/4  Elbow ext: 5/5   Eblow flex: 5/5 10/20: UE MMT R/L:   Shoulder Flexion: 4/4  Shoulder abduction: 4/4 Shoulder ER: 4/4  Shoulder IR: 4/4  Elbow ext: 5/5   Elbow flex: 5/5.  10/12: B shoulder flexion 4/5 MMT, abduction 4/5 MMT biceps/triceps 5/5 MMT.  6/13: B shoulder flexion 4/5 MMT, abduction 4/5 MMT biceps/triceps 5/5 MMT.  IR/ER 4+/5     Time 8     Period Weeks     Status Partially Met     Target Date 09/04/2022           PT LONG TERM GOAL 3    Title Pt will improve functional R hand strength equal to L to promote return of hand useage during grooming and feeding.     Baseline Grip R/L: 76.1 / 33.9    Lateral prehension grip R/L: 14 / 4 10/20: Grip R/L: 62.0 / 41.4    Lateral prehension grip R/L: 13 / 6.  1/12:  R 69#/ L 49.8#.  6/13: see above    Time 8     Period Weeks     Status  Partially Met     Target Date 09/04/2022            Plan -     Clinical Impression Statement Continued primary therapist POC with focus on manual therapy for range of motion, tissue extensibility, and pain control. Also repeated trigger point dry needling. Pt reports reduced pain and tightness post session with good understanding of HEP. Encouraged continuing HEP and follow-up as scheduled. Pt will continue to benefit from skilled PT  to reduce cervical pain with ADL's and work related tasks.       Personal Factors and Comorbidities Comorbidity 2;Profession;Past/Current Experience    Comorbidities Hypertension, Obesity    Examination-Activity Limitations Squat;Locomotion Level    Examination-Participation Restrictions Occupation;Cleaning;Laundry    Stability/Clinical Decision Making Evolving/Moderate complexity    Clinical Decision Making Moderate    Rehab Potential Good    PT Frequency 1-2x/biweekly   PT Duration 8 weeks    PT Treatment/Interventions ADLs/Self Care Home Management;Aquatic Therapy;Biofeedback;Cryotherapy;Electrical Stimulation;Moist Heat;Ultrasound;Functional mobility training;Therapeutic activities;Therapeutic exercise;Balance training;Neuromuscular re-education;Patient/family education;Manual techniques;Scar mobilization;Passive range of motion;Dry needling;Energy conservation;Taping;Gait training;Stair training;DME Instruction;Spinal Manipulations;Joint Manipulations, Canalith repositioning procedure.    PT Next Visit Plan Trigger point release technique/ Hypervolt.  Attempt supine TDN to UT musculature.     PT Home Exercise Plan [email or web address].   HEP: [id number]    Consulted and Agree with Plan of Care Patient         Lynnea Maizes PT, DPT, GCS  Physical Therapist- [facility]  [facility]  07/16/2022, 8:29 PM

## 2022-07-17 ENCOUNTER — Ambulatory Visit: Payer: BC Managed Care – PPO | Admitting: Physical Therapy

## 2022-07-24 ENCOUNTER — Ambulatory Visit: Payer: BC Managed Care – PPO | Admitting: Physical Therapy

## 2022-07-30 ENCOUNTER — Ambulatory Visit: Payer: BC Managed Care – PPO | Attending: Internal Medicine

## 2022-07-30 DIAGNOSIS — M79601 Pain in right arm: Secondary | ICD-10-CM | POA: Insufficient documentation

## 2022-07-30 DIAGNOSIS — M2560 Stiffness of unspecified joint, not elsewhere classified: Secondary | ICD-10-CM | POA: Insufficient documentation

## 2022-07-30 DIAGNOSIS — M6281 Muscle weakness (generalized): Secondary | ICD-10-CM | POA: Insufficient documentation

## 2022-07-30 NOTE — Therapy (Signed)
OUTPATIENT PHYSICAL THERAPY TREATMENT   Patient Name: Amanda Humphrey MRN: [id number] DOB:1965/06/29, 57 y.o., female Today's Date: 07/30/2022  PCP: Sherlene Shams, MD REFERRING PROVIDER: Sherlene Shams, MD    PT End of Session - 07/30/22 0935     Visit Number 85    Number of Visits 91    Date for PT Re-Evaluation 09/04/22    PT Start Time 0935    PT Stop Time 1015    PT Time Calculation (min) 40 min    Activity Tolerance Patient tolerated treatment well    Behavior During Therapy [facility] for tasks assessed/performed            Past Medical History:  Diagnosis Date   Allergy    Arthritis    Chronic kidney disease    stones   Fibrocystic breast disease 2013   GERD (gastroesophageal reflux disease)    Meniere disease    Migraines    Past Surgical History:  Procedure Laterality Date   ABDOMINAL HYSTERECTOMY  2011   laprascopic supracervical, [place]   APPENDECTOMY  1999   BREAST BIOPSY Right 11/2010    fibrocystic disease, [place]   CESAREAN SECTION     4 total   CHOLECYSTECTOMY  2011   SPINE SURGERY  2021   TONSILLECTOMY AND ADENOIDECTOMY  1975   TUBAL LIGATION     Patient Active Problem List   Diagnosis Date Noted   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   H/O of hemilaminectomy 02/15/2019   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   Fatigue 09/23/2017   History of pneumonia 04/30/2016   Grief reaction 04/30/2016   Vitamin D deficiency 02/22/2015   Hyperlipidemia 01/01/2015   Visit for preventive health examination 12/04/2013   Lower extremity edema 09/02/2013   Essential hypertension 09/02/2013   History of cardiomyopathy 09/02/2013   SVT (supraventricular tachycardia) 09/02/2013   Contrast media allergy 03/10/2013   Fibrocystic breast disease    Morbid obesity (HCC) 02/05/2012   Meniere disease     REFERRING DIAG:  M79.18 (ICD-10-CM) - Myalgia, other site  Z98.890 (ICD-10-CM) - Other specified postprocedural states  M54.12  (ICD-10-CM) - Radiculopathy, cervical region    THERAPY DIAG:  Joint stiffness of spine  Pain in right arm  PERTINENT HISTORY: See evaluation  PRECAUTIONS: N/A  SUBJECTIVE:  6//19  Pt reports moderate pain in her R side of neck.   PAIN:  Are you having pain? Yes: NPRS scale: 5/10 neck/ R arm ("numbness/tingling in pinky/ring finger").   Pain location: Neck Pain description: aching/ stiff/ tight Aggravating factors: prolonged sitting/ activity Relieving factors: stretches/ PT  12/5: Cervical AROM: L rotn.: 70 deg./ R rotn. 51 deg.  (R cervical tightness/ catching).  L UT feels tight.   Cervical flexion 60 deg./ extension 38 deg.  L lat. Flexion 38 deg./ R lat. Flexion 38 deg.  Seated B shoulder flexion/ abduction 4/5 MMT (increase R UT/ shoulder discomfort).  Grip: L 76.7#/ R 52.1#.  Reassessed R hand/finger manual dexterity.     TODAY'S TREATMENT:   07/30/2022    SUBJECTIVE: Patient reports she is doing well today.  No excessive stress.  She is having some more discomfort along her bilateral suboccipitals upon arrival today.  No specific questions currently.     Manual Therapy:   Supine STM on R cervical paraspinals, levator scap, upper trap, and suboccipitals; Suboccipital release with focus on R > L; Supine R levator scap stretches: 2 x 30 sec  Supine R UT stretches with  GHJ OP 3 x 30 sec   STM to R UT with Biofreeze after tx.   Gentle cervical manual traction with 15-second hold/15 secon relax x multiple bouts   Trigger Point Dry Needling (TDN), unbilled Education previously performed with patient regarding potential benefit of TDN. Previously reviewed precautions and risks with patient. Pt provided verbal consent to treatment. In prone using clean technique TDN performed to bilateral suboccipitals with 4, 0.25 x 40 single needle placements (2 on each side).  Also performed TDN to R upper trap with 2, 0.25 x 40 single needle placements with local twitch response (LTR).  Pistoning technique utilized. Pt reports soreness following intervention;    PATIENT EDUCATION: Education details: Plan of care Person educated: Patient Education method: Explanation, Demonstration, and Handouts Education comprehension: verbalized understanding and returned demonstration   HOME EXERCISE PROGRAM: [id number].  HEP: [id number]             PT LONG TERM GOAL 1    Title Pt will improve worse cervical pain by at least 3 NPS points to allow increased tolerance during work and home related ADLs.     Baseline NPS: 8/10 current, 7/10 best, 10/10 Worse. 10/20: Worse pain 7/10.  10/12: 5/10 neck pain at worst (improving)- stress related.  4/15: 4/10. 6/13: 5/10    Time 8     Period Weeks     Status Partially Met     Target Date 09/04/2022          PT LONG TERM GOAL 2    Title Pt will improve bilat UE MMT by atleast 1/2 grade to facilitate greater functional abilities with ADLs related to caregiving.     Baseline UE MMT R/L:   Shoulder Flexion: 4/4-  Shoulder abduction: 4/4-  Shoulder ER: 4/4  Shoulder IR: 4/4  Elbow ext: 5/5   Eblow flex: 5/5 10/20: UE MMT R/L:   Shoulder Flexion: 4/4  Shoulder abduction: 4/4 Shoulder ER: 4/4  Shoulder IR: 4/4  Elbow ext: 5/5   Elbow flex: 5/5.  10/12: B shoulder flexion 4/5 MMT, abduction 4/5 MMT biceps/triceps 5/5 MMT.  6/13: B shoulder flexion 4/5 MMT, abduction 4/5 MMT biceps/triceps 5/5 MMT.  IR/ER 4+/5     Time 8     Period Weeks     Status Partially Met     Target Date 09/04/2022           PT LONG TERM GOAL 3    Title Pt will improve functional R hand strength equal to L to promote return of hand useage during grooming and feeding.     Baseline Grip R/L: 76.1 / 33.9    Lateral prehension grip R/L: 14 / 4 10/20: Grip R/L: 62.0 / 41.4    Lateral prehension grip R/L: 13 / 6.  1/12:  R 69#/ L 49.8#.  6/13: see above    Time 8     Period Weeks     Status Partially Met     Target Date 09/04/2022            Plan -     Clinical Impression  Statement Continued primary therapist POC with focus on manual therapy for range of motion, tissue extensibility, and pain control. Also repeated trigger point dry needling with greater focus today on suboccipitals due to pain complaints upon arrival.  Pt reports soreness post session with good understanding of HEP. Encouraged continuing HEP and follow-up as scheduled. Pt will continue to benefit from skilled PT to reduce cervical  pain with ADL's and work related tasks.       Personal Factors and Comorbidities Comorbidity 2;Profession;Past/Current Experience    Comorbidities Hypertension, Obesity    Examination-Activity Limitations Squat;Locomotion Level    Examination-Participation Restrictions Occupation;Cleaning;Laundry    Stability/Clinical Decision Making Evolving/Moderate complexity    Clinical Decision Making Moderate    Rehab Potential Good    PT Frequency 1-2x/biweekly   PT Duration 8 weeks    PT Treatment/Interventions ADLs/Self Care Home Management;Aquatic Therapy;Biofeedback;Cryotherapy;Electrical Stimulation;Moist Heat;Ultrasound;Functional mobility training;Therapeutic activities;Therapeutic exercise;Balance training;Neuromuscular re-education;Patient/family education;Manual techniques;Scar mobilization;Passive range of motion;Dry needling;Energy conservation;Taping;Gait training;Stair training;DME Instruction;Spinal Manipulations;Joint Manipulations, Canalith repositioning procedure.    PT Next Visit Plan Trigger point release technique/ Hypervolt.  Attempt supine TDN to UT musculature.     PT Home Exercise Plan [id number].   HEP: [id number]    Consulted and Agree with Plan of Care Patient         Lynnea Maizes PT, DPT, GCS  Physical Therapist- [facility]  [facility]  07/30/2022, 8:29 PM

## 2022-08-07 ENCOUNTER — Ambulatory Visit: Payer: BC Managed Care – PPO

## 2022-08-07 DIAGNOSIS — M6281 Muscle weakness (generalized): Secondary | ICD-10-CM | POA: Diagnosis not present

## 2022-08-07 DIAGNOSIS — M2560 Stiffness of unspecified joint, not elsewhere classified: Secondary | ICD-10-CM | POA: Diagnosis not present

## 2022-08-07 DIAGNOSIS — M79601 Pain in right arm: Secondary | ICD-10-CM

## 2022-08-07 NOTE — Therapy (Signed)
OUTPATIENT PHYSICAL THERAPY TREATMENT  Patient Name: Amanda Humphrey MRN: [id number] DOB:[birth year], 57 y.o., female Today's Date: 08/07/2022  PCP: Sherlene Shams, MD REFERRING PROVIDER: Sherlene Shams, MD   PT End of Session - 08/07/22 0855     Visit Number 86    Number of Visits 91    Date for PT Re-Evaluation 09/04/22    Authorization - Visit Number --    Authorization - Number of Visits --    PT Start Time 0850    PT Stop Time 0935    PT Time Calculation (min) 45 min    Activity Tolerance Patient tolerated treatment well    Behavior During Therapy [facility] for tasks assessed/performed            Past Medical History:  Diagnosis Date   Allergy    Arthritis    Chronic kidney disease    stones   Fibrocystic breast disease 2013   GERD (gastroesophageal reflux disease)    Meniere disease    Migraines    Past Surgical History:  Procedure Laterality Date   ABDOMINAL HYSTERECTOMY  2011   laprascopic supracervical, [place]   APPENDECTOMY  1999   BREAST BIOPSY Right 11/2010    fibrocystic disease, [place]   CESAREAN SECTION     4 total   CHOLECYSTECTOMY  2011   SPINE SURGERY  2021   TONSILLECTOMY AND ADENOIDECTOMY  1975   TUBAL LIGATION     Patient Active Problem List   Diagnosis Date Noted   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   H/O of hemilaminectomy 02/15/2019   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   Fatigue 09/23/2017   History of pneumonia 04/30/2016   Grief reaction 04/30/2016   Vitamin D deficiency 02/22/2015   Hyperlipidemia 01/01/2015   Visit for preventive health examination 12/04/2013   Lower extremity edema 09/02/2013   Essential hypertension 09/02/2013   History of cardiomyopathy 09/02/2013   SVT (supraventricular tachycardia) 09/02/2013   Contrast media allergy 03/10/2013   Fibrocystic breast disease    Morbid obesity (HCC) 02/05/2012   Meniere disease     REFERRING DIAG:  M79.18 (ICD-10-CM) - Myalgia, other site   Z98.890 (ICD-10-CM) - Other specified postprocedural states  M54.12 (ICD-10-CM) - Radiculopathy, cervical region    THERAPY DIAG:  Joint stiffness of spine  Pain in right arm  PERTINENT HISTORY: See evaluation  PRECAUTIONS: N/A  SUBJECTIVE:  6//19  Pt reports moderate pain in her R side of neck.   PAIN:  Are you having pain? Yes: NPRS scale: 5/10 neck/ R arm ("numbness/tingling in pinky/ring finger").   Pain location: Neck Pain description: aching/ stiff/ tight Aggravating factors: prolonged sitting/ activity Relieving factors: stretches/ PT  12/5: Cervical AROM: L rotn.: 70 deg./ R rotn. 51 deg.  (R cervical tightness/ catching).  L UT feels tight.   Cervical flexion 60 deg./ extension 38 deg.  L lat. Flexion 38 deg./ R lat. Flexion 38 deg.  Seated B shoulder flexion/ abduction 4/5 MMT (increase R UT/ shoulder discomfort).  Grip: L 76.7#/ R 52.1#.  Reassessed R hand/finger manual dexterity.     TODAY'S TREATMENT:   08/07/2022    SUBJECTIVE: Patient reports she is doing well today. She had a lot of soreness from the TDN after last session but felt it was helpful.  She is having some more discomfort today along her mid cervical spine.  No specific questions currently.     Manual Therapy:   Supine STM on bilateral cervical paraspinals, levator scap,  upper trap, and suboccipitals; Suboccipital release; Supine levator scap stretches: 2 x 30 sec bilaterally; Supine UT stretches with GHJ OP 2 x 30 sec bilaterally;   Gentle cervical manual traction with 15-second hold/15 secon relax x multiple bouts   Trigger Point Dry Needling (TDN), unbilled Education previously performed with patient regarding potential benefit of TDN. Previously reviewed precautions and risks with patient. Pt provided verbal consent to treatment. In prone using clean technique TDN performed to bilateral upper traps with 4, 0.25 x 40 single needle placements (2 on each side).  Also performed TDN to bilateral  cervical multifidi with 2, 0.25 x 40 single needle placements (one on each side at C3) with deep ache reported. Pistoning technique utilized with periods of static hold. No excessive soreness reported today following intervention;    PATIENT EDUCATION: Education details: Plan of care Person educated: Patient Education method: Explanation, Demonstration, and Handouts Education comprehension: verbalized understanding and returned demonstration   HOME EXERCISE PROGRAM: [id number].  HEP: [id number]             PT LONG TERM GOAL 1    Title Pt will improve worse cervical pain by at least 3 NPS points to allow increased tolerance during work and home related ADLs.     Baseline NPS: 8/10 current, 7/10 best, 10/10 Worse. 10/20: Worse pain 7/10.  10/12: 5/10 neck pain at worst (improving)- stress related.  4/15: 4/10. 6/13: 5/10    Time 8     Period Weeks     Status Partially Met     Target Date 09/04/2022          PT LONG TERM GOAL 2    Title Pt will improve bilat UE MMT by atleast 1/2 grade to facilitate greater functional abilities with ADLs related to caregiving.     Baseline UE MMT R/L:   Shoulder Flexion: 4/4-  Shoulder abduction: 4/4-  Shoulder ER: 4/4  Shoulder IR: 4/4  Elbow ext: 5/5   Eblow flex: 5/5 10/20: UE MMT R/L:   Shoulder Flexion: 4/4  Shoulder abduction: 4/4 Shoulder ER: 4/4  Shoulder IR: 4/4  Elbow ext: 5/5   Elbow flex: 5/5.  10/12: B shoulder flexion 4/5 MMT, abduction 4/5 MMT biceps/triceps 5/5 MMT.  6/13: B shoulder flexion 4/5 MMT, abduction 4/5 MMT biceps/triceps 5/5 MMT.  IR/ER 4+/5     Time 8     Period Weeks     Status Partially Met     Target Date 09/04/2022           PT LONG TERM GOAL 3    Title Pt will improve functional R hand strength equal to L to promote return of hand useage during grooming and feeding.     Baseline Grip R/L: 76.1 / 33.9    Lateral prehension grip R/L: 14 / 4 10/20: Grip R/L: 62.0 / 41.4    Lateral prehension grip R/L: 13 / 6.  1/12:  R 69#/ L  49.8#.  6/13: see above    Time 8     Period Weeks     Status Partially Met     Target Date 09/04/2022            Plan -     Clinical Impression Statement Continued primary therapist POC with focus on manual therapy for range of motion, tissue extensibility, and pain control. Also repeated trigger point dry needling with greater focus today on upper traps and cervical multifidi. No excessive soreness reported today at the end  of session. Encouraged continuing HEP and follow-up as scheduled. Pt will continue to benefit from skilled PT to reduce cervical pain with ADL's and work related tasks.       Personal Factors and Comorbidities Comorbidity 2;Profession;Past/Current Experience    Comorbidities Hypertension, Obesity    Examination-Activity Limitations Squat;Locomotion Level    Examination-Participation Restrictions Occupation;Cleaning;Laundry    Stability/Clinical Decision Making Evolving/Moderate complexity    Clinical Decision Making Moderate    Rehab Potential Good    PT Frequency 1-2x/biweekly   PT Duration 8 weeks    PT Treatment/Interventions ADLs/Self Care Home Management;Aquatic Therapy;Biofeedback;Cryotherapy;Electrical Stimulation;Moist Heat;Ultrasound;Functional mobility training;Therapeutic activities;Therapeutic exercise;Balance training;Neuromuscular re-education;Patient/family education;Manual techniques;Scar mobilization;Passive range of motion;Dry needling;Energy conservation;Taping;Gait training;Stair training;DME Instruction;Spinal Manipulations;Joint Manipulations, Canalith repositioning procedure.    PT Next Visit Plan Trigger point release technique/ Hypervolt.  Attempt supine TDN to UT musculature.     PT Home Exercise Plan [id number].   HEP: [id number]    Consulted and Agree with Plan of Care Patient         Lynnea Maizes PT, DPT, GCS  Physical Therapist- [facility]  [facility]  08/07/2022, 8:29 PM

## 2022-08-09 DIAGNOSIS — J019 Acute sinusitis, unspecified: Secondary | ICD-10-CM | POA: Diagnosis not present

## 2022-08-13 ENCOUNTER — Encounter: Payer: Self-pay | Admitting: Physical Therapy

## 2022-08-13 ENCOUNTER — Ambulatory Visit: Payer: BC Managed Care – PPO | Admitting: Physical Therapy

## 2022-08-13 DIAGNOSIS — M79601 Pain in right arm: Secondary | ICD-10-CM

## 2022-08-13 DIAGNOSIS — M6281 Muscle weakness (generalized): Secondary | ICD-10-CM

## 2022-08-13 DIAGNOSIS — M2560 Stiffness of unspecified joint, not elsewhere classified: Secondary | ICD-10-CM | POA: Diagnosis not present

## 2022-08-13 NOTE — Therapy (Signed)
OUTPATIENT PHYSICAL THERAPY TREATMENT  Patient Name: Amanda Humphrey MRN: [id number] DOB:1965/10/24, 57 y.o., female Today's Date: 08/13/2022  PCP: Sherlene Shams, MD REFERRING PROVIDER: Sherlene Shams, MD   PT End of Session - 08/13/22 1008     Visit Number 87    Number of Visits 91    Date for PT Re-Evaluation 09/04/22    PT Start Time 0733    PT Stop Time 0812    PT Time Calculation (min) 39 min            Past Medical History:  Diagnosis Date   Allergy    Arthritis    Chronic kidney disease    stones   Fibrocystic breast disease 2013   GERD (gastroesophageal reflux disease)    Meniere disease    Migraines    Past Surgical History:  Procedure Laterality Date   ABDOMINAL HYSTERECTOMY  2011   laprascopic supracervical, [place]   APPENDECTOMY  1999   BREAST BIOPSY Right 11/2010    fibrocystic disease, [place]   CESAREAN SECTION     4 total   CHOLECYSTECTOMY  2011   SPINE SURGERY  2021   TONSILLECTOMY AND ADENOIDECTOMY  1975   TUBAL LIGATION     Patient Active Problem List   Diagnosis Date Noted   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   H/O of hemilaminectomy 02/15/2019   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   Fatigue 09/23/2017   History of pneumonia 04/30/2016   Grief reaction 04/30/2016   Vitamin D deficiency 02/22/2015   Hyperlipidemia 01/01/2015   Visit for preventive health examination 12/04/2013   Lower extremity edema 09/02/2013   Essential hypertension 09/02/2013   History of cardiomyopathy 09/02/2013   SVT (supraventricular tachycardia) 09/02/2013   Contrast media allergy 03/10/2013   Fibrocystic breast disease    Morbid obesity (HCC) 02/05/2012   Meniere disease     REFERRING DIAG:  M79.18 (ICD-10-CM) - Myalgia, other site  Z98.890 (ICD-10-CM) - Other specified postprocedural states  M54.12 (ICD-10-CM) - Radiculopathy, cervical region    THERAPY DIAG:  Joint stiffness of spine  Pain in right arm  Muscle  weakness (generalized)  PERTINENT HISTORY: See evaluation  PRECAUTIONS: N/A  SUBJECTIVE:    Pt states she was really sore in neck for a few days after TDN last tx. Session.  Pt. Had nausea after last tx.  Pt. Has been to massage therapist after last PT tx. Session but did not have therapist work on neck due to soreness.  Pt. reports moderate pain in her R side of neck.   PAIN:  Are you having pain? Yes: NPRS scale: 5/10 neck/ R arm ("numbness/tingling in pinky/ring finger").   Pain location: Neck Pain description: aching/ stiff/ tight Aggravating factors: prolonged sitting/ activity Relieving factors: stretches/ PT  12/5: Cervical AROM: L rotn.: 70 deg./ R rotn. 51 deg.  (R cervical tightness/ catching).  L UT feels tight.   Cervical flexion 60 deg./ extension 38 deg.  L lat. Flexion 38 deg./ R lat. Flexion 38 deg.  Seated B shoulder flexion/ abduction 4/5 MMT (increase R UT/ shoulder discomfort).  Grip: L 76.7#/ R 52.1#.  Reassessed R hand/finger manual dexterity.    Manual Therapy:   Supine STM on bilateral cervical paraspinals, levator scap, upper trap, and suboccipitals; Suboccipital release; Supine levator scap stretches: 2 x 30 sec bilaterally; Supine UT stretches with GHJ OP 2 x 30 sec bilaterally;   Gentle cervical manual traction with 15-second hold/15 secon relax x multiple bouts  Prone STM to cervical/suboccipital (MH to upper back/neck).  (+) R upper cervical muscle knot/ tenderness   NOT TODAY Trigger Point Dry Needling (TDN), unbilled Education previously performed with patient regarding potential benefit of TDN. Previously reviewed precautions and risks with patient. Pt provided verbal consent to treatment. In prone using clean technique TDN performed to bilateral upper traps with 4, 0.25 x 40 single needle placements (2 on each side).  Also performed TDN to bilateral cervical multifidi with 2, 0.25 x 40 single needle placements (one on each side at C3) with deep ache  reported. Pistoning technique utilized with periods of static hold. No excessive soreness reported today following intervention;    PATIENT EDUCATION: Education details: Plan of care Person educated: Patient Education method: Explanation, Demonstration, and Handouts Education comprehension: verbalized understanding and returned demonstration   HOME EXERCISE PROGRAM: [id number].  HEP: [id number]             PT LONG TERM GOAL 1    Title Pt will improve worse cervical pain by at least 3 NPS points to allow increased tolerance during work and home related ADLs.     Baseline NPS: 8/10 current, 7/10 best, 10/10 Worse. 10/20: Worse pain 7/10.  10/12: 5/10 neck pain at worst (improving)- stress related.  4/15: 4/10. 6/13: 5/10    Time 8     Period Weeks     Status Partially Met     Target Date 09/04/2022          PT LONG TERM GOAL 2    Title Pt will improve bilat UE MMT by atleast 1/2 grade to facilitate greater functional abilities with ADLs related to caregiving.     Baseline UE MMT R/L:   Shoulder Flexion: 4/4-  Shoulder abduction: 4/4-  Shoulder ER: 4/4  Shoulder IR: 4/4  Elbow ext: 5/5   Eblow flex: 5/5 10/20: UE MMT R/L:   Shoulder Flexion: 4/4  Shoulder abduction: 4/4 Shoulder ER: 4/4  Shoulder IR: 4/4  Elbow ext: 5/5   Elbow flex: 5/5.  10/12: B shoulder flexion 4/5 MMT, abduction 4/5 MMT biceps/triceps 5/5 MMT.  6/13: B shoulder flexion 4/5 MMT, abduction 4/5 MMT biceps/triceps 5/5 MMT.  IR/ER 4+/5     Time 8     Period Weeks     Status Partially Met     Target Date 09/04/2022           PT LONG TERM GOAL 3    Title Pt will improve functional R hand strength equal to L to promote return of hand useage during grooming and feeding.     Baseline Grip R/L: 76.1 / 33.9    Lateral prehension grip R/L: 14 / 4 10/20: Grip R/L: 62.0 / 41.4    Lateral prehension grip R/L: 13 / 6.  1/12:  R 69#/ L 49.8#.  6/13: see above    Time 8     Period Weeks     Status Partially Met     Target Date 09/04/2022             Plan -     Clinical Impression Statement Continued treatment focus on manual therapy for range of motion, tissue extensibility, and pain control. No TDN today due to marked soreness after last 2 sessions.  PT will reassess need for TDN next tx. Session.  Good cervical AROM (all planes). Encouraged continuing HEP and follow-up as scheduled. Pt will continue to benefit from skilled PT to reduce cervical pain with ADL's and  work related tasks.       Personal Factors and Comorbidities Comorbidity 2;Profession;Past/Current Experience    Comorbidities Hypertension, Obesity    Examination-Activity Limitations Squat;Locomotion Level    Examination-Participation Restrictions Occupation;Cleaning;Laundry    Stability/Clinical Decision Making Evolving/Moderate complexity    Clinical Decision Making Moderate    Rehab Potential Good    PT Frequency 1-2x/biweekly   PT Duration 8 weeks    PT Treatment/Interventions ADLs/Self Care Home Management;Aquatic Therapy;Biofeedback;Cryotherapy;Electrical Stimulation;Moist Heat;Ultrasound;Functional mobility training;Therapeutic activities;Therapeutic exercise;Balance training;Neuromuscular re-education;Patient/family education;Manual techniques;Scar mobilization;Passive range of motion;Dry needling;Energy conservation;Taping;Gait training;Stair training;DME Instruction;Spinal Manipulations;Joint Manipulations, Canalith repositioning procedure.    PT Next Visit Plan Trigger point release technique/ Hypervolt.  Attempt supine TDN to UT musculature.     PT Home Exercise Plan [id number].   HEP: [id number]    Consulted and Agree with Plan of Care Patient         Cammie Mcgee, PT, DPT # [phone] Physical Therapist- [facility]  08/13/2022, 10:19 AM

## 2022-08-20 ENCOUNTER — Ambulatory Visit: Payer: BC Managed Care – PPO

## 2022-08-20 ENCOUNTER — Encounter: Payer: Self-pay | Admitting: Physical Therapy

## 2022-08-20 DIAGNOSIS — M79601 Pain in right arm: Secondary | ICD-10-CM | POA: Diagnosis not present

## 2022-08-20 DIAGNOSIS — M2560 Stiffness of unspecified joint, not elsewhere classified: Secondary | ICD-10-CM

## 2022-08-20 DIAGNOSIS — M6281 Muscle weakness (generalized): Secondary | ICD-10-CM | POA: Diagnosis not present

## 2022-08-20 NOTE — Therapy (Signed)
OUTPATIENT PHYSICAL THERAPY TREATMENT  Patient Name: Amanda Humphrey MRN: [id number] DOB:Jun 26, 1965, 57 y.o., female Today's Date: 08/20/2022  PCP: Sherlene Shams, MD REFERRING PROVIDER: Sherlene Shams, MD   PT End of Session - 08/20/22 0736     Visit Number 88    Number of Visits 91    Date for PT Re-Evaluation 09/04/22    PT Start Time 0734    PT Stop Time 0814    PT Time Calculation (min) 40 min    Activity Tolerance Patient tolerated treatment well    Behavior During Therapy [facility] for tasks assessed/performed             Past Medical History:  Diagnosis Date   Allergy    Arthritis    Chronic kidney disease    stones   Fibrocystic breast disease 2013   GERD (gastroesophageal reflux disease)    Meniere disease    Migraines    Past Surgical History:  Procedure Laterality Date   ABDOMINAL HYSTERECTOMY  2011   laprascopic supracervical, [place]   APPENDECTOMY  1999   BREAST BIOPSY Right 11/2010    fibrocystic disease, [place]   CESAREAN SECTION     4 total   CHOLECYSTECTOMY  2011   SPINE SURGERY  2021   TONSILLECTOMY AND ADENOIDECTOMY  1975   TUBAL LIGATION     Patient Active Problem List   Diagnosis Date Noted   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   H/O of hemilaminectomy 02/15/2019   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   Fatigue 09/23/2017   History of pneumonia 04/30/2016   Grief reaction 04/30/2016   Vitamin D deficiency 02/22/2015   Hyperlipidemia 01/01/2015   Visit for preventive health examination 12/04/2013   Lower extremity edema 09/02/2013   Essential hypertension 09/02/2013   History of cardiomyopathy 09/02/2013   SVT (supraventricular tachycardia) 09/02/2013   Contrast media allergy 03/10/2013   Fibrocystic breast disease    Morbid obesity (HCC) 02/05/2012   Meniere disease     REFERRING DIAG:  M79.18 (ICD-10-CM) - Myalgia, other site  Z98.890 (ICD-10-CM) - Other specified postprocedural states  M54.12 (ICD-10-CM)  - Radiculopathy, cervical region    THERAPY DIAG:  Joint stiffness of spine  Pain in right arm  Muscle weakness (generalized)  PERTINENT HISTORY: See evaluation  PRECAUTIONS: N/A  SUBJECTIVE:   Patient reports pain in L side of neck and upper trap region. Slight pain in R cervical region (~C2-3)   PAIN:  Are you having pain? Yes: NPRS scale: 5/10 neck/ R arm ("numbness/tingling in pinky/ring finger").   Pain location: Neck Pain description: aching/ stiff/ tight Aggravating factors: prolonged sitting/ activity Relieving factors: stretches/ PT  12/5: Cervical AROM: L rotn.: 70 deg./ R rotn. 51 deg.  (R cervical tightness/ catching).  L UT feels tight.   Cervical flexion 60 deg./ extension 38 deg.  L lat. Flexion 38 deg./ R lat. Flexion 38 deg.  Seated B shoulder flexion/ abduction 4/5 MMT (increase R UT/ shoulder discomfort).  Grip: L 76.7#/ R 52.1#.  Reassessed R hand/finger manual dexterity.    Today's Treatment:  Manual Therapy:   Supine STM on bilateral cervical paraspinals, levator scap, upper trap, and suboccipitals; (+) trigger point R/L UT and suboccipitals Suboccipital release; Supine levator scap stretches: 2 x 30 sec bilaterally; Supine UT stretches with GHJ OP 2 x 30 sec bilaterally;      NOT TODAY Trigger Point Dry Needling (TDN), unbilled Education previously performed with patient regarding potential benefit of TDN. Previously  reviewed precautions and risks with patient. Pt provided verbal consent to treatment. In prone using clean technique TDN performed to bilateral upper traps with 4, 0.25 x 40 single needle placements (2 on each side).  Also performed TDN to bilateral cervical multifidi with 2, 0.25 x 40 single needle placements (one on each side at C3) with deep ache reported. Pistoning technique utilized with periods of static hold. No excessive soreness reported today following intervention;    PATIENT EDUCATION: Education details: Plan of care Person  educated: Patient Education method: Explanation, Demonstration, and Handouts Education comprehension: verbalized understanding and returned demonstration   HOME EXERCISE PROGRAM: [id number].  HEP: [id number]             PT LONG TERM GOAL 1    Title Pt will improve worse cervical pain by at least 3 NPS points to allow increased tolerance during work and home related ADLs.     Baseline NPS: 8/10 current, 7/10 best, 10/10 Worse. 10/20: Worse pain 7/10.  10/12: 5/10 neck pain at worst (improving)- stress related.  4/15: 4/10. 6/13: 5/10    Time 8     Period Weeks     Status Partially Met     Target Date 09/04/2022          PT LONG TERM GOAL 2    Title Pt will improve bilat UE MMT by atleast 1/2 grade to facilitate greater functional abilities with ADLs related to caregiving.     Baseline UE MMT R/L:   Shoulder Flexion: 4/4-  Shoulder abduction: 4/4-  Shoulder ER: 4/4  Shoulder IR: 4/4  Elbow ext: 5/5   Eblow flex: 5/5 10/20: UE MMT R/L:   Shoulder Flexion: 4/4  Shoulder abduction: 4/4 Shoulder ER: 4/4  Shoulder IR: 4/4  Elbow ext: 5/5   Elbow flex: 5/5.  10/12: B shoulder flexion 4/5 MMT, abduction 4/5 MMT biceps/triceps 5/5 MMT.  6/13: B shoulder flexion 4/5 MMT, abduction 4/5 MMT biceps/triceps 5/5 MMT.  IR/ER 4+/5     Time 8     Period Weeks     Status Partially Met     Target Date 09/04/2022           PT LONG TERM GOAL 3    Title Pt will improve functional R hand strength equal to L to promote return of hand useage during grooming and feeding.     Baseline Grip R/L: 76.1 / 33.9    Lateral prehension grip R/L: 14 / 4 10/20: Grip R/L: 62.0 / 41.4    Lateral prehension grip R/L: 13 / 6.  1/12:  R 69#/ L 49.8#.  6/13: see above    Time 8     Period Weeks     Status Partially Met     Target Date 09/04/2022            Plan -     Clinical Impression Statement  Continued treatment focus on manual therapy for range of motion, tissue extensibility, and pain control. Good cervical AROM (all  planes). Encouraged continuing HEP and follow-up as scheduled. Would benefit from TDN next session. Pt will continue to benefit from skilled PT to reduce cervical pain with ADL's and work related tasks.       Personal Factors and Comorbidities Comorbidity 2;Profession;Past/Current Experience    Comorbidities Hypertension, Obesity    Examination-Activity Limitations Squat;Locomotion Level    Examination-Participation Restrictions Occupation;Cleaning;Laundry    Stability/Clinical Decision Making Evolving/Moderate complexity    Clinical Decision Making Moderate  Rehab Potential Good    PT Frequency 1-2x/biweekly   PT Duration 8 weeks    PT Treatment/Interventions ADLs/Self Care Home Management;Aquatic Therapy;Biofeedback;Cryotherapy;Electrical Stimulation;Moist Heat;Ultrasound;Functional mobility training;Therapeutic activities;Therapeutic exercise;Balance training;Neuromuscular re-education;Patient/family education;Manual techniques;Scar mobilization;Passive range of motion;Dry needling;Energy conservation;Taping;Gait training;Stair training;DME Instruction;Spinal Manipulations;Joint Manipulations, Canalith repositioning procedure.    PT Next Visit Plan Trigger point release technique/ Hypervolt.  Attempt supine TDN to UT musculature.     PT Home Exercise Plan [id number].   HEP: [id number]    Consulted and Agree with Plan of Care Patient         Maylon Peppers, PT, DPT  Physical Therapist- [place]  [facility]  08/20/2022, 10:19 AM

## 2022-09-02 ENCOUNTER — Ambulatory Visit: Payer: BC Managed Care – PPO | Attending: Internal Medicine | Admitting: Physical Therapy

## 2022-09-02 DIAGNOSIS — M79601 Pain in right arm: Secondary | ICD-10-CM | POA: Insufficient documentation

## 2022-09-02 DIAGNOSIS — M6281 Muscle weakness (generalized): Secondary | ICD-10-CM | POA: Insufficient documentation

## 2022-09-02 DIAGNOSIS — M2560 Stiffness of unspecified joint, not elsewhere classified: Secondary | ICD-10-CM | POA: Insufficient documentation

## 2022-09-06 NOTE — Therapy (Signed)
OUTPATIENT PHYSICAL THERAPY TREATMENT  Patient Name: Amanda Humphrey MRN: [id number] DOB:19-Jan-1966, 57 y.o., female Today's Date: 09/02/22  PCP: Sherlene Shams, MD REFERRING PROVIDER: Sherlene Shams, MD   PT End of Session - 09/06/22 1707     Visit Number 89    Number of Visits 91    Date for PT Re-Evaluation 09/04/22    PT Start Time 0818    PT Stop Time 0901    PT Time Calculation (min) 43 min    Activity Tolerance Patient tolerated treatment well    Behavior During Therapy [facility] for tasks assessed/performed             Past Medical History:  Diagnosis Date   Allergy    Arthritis    Chronic kidney disease    stones   Fibrocystic breast disease 2013   GERD (gastroesophageal reflux disease)    Meniere disease    Migraines    Past Surgical History:  Procedure Laterality Date   ABDOMINAL HYSTERECTOMY  2011   laprascopic supracervical, [place]   APPENDECTOMY  1999   BREAST BIOPSY Right 11/2010    fibrocystic disease, [place]   CESAREAN SECTION     4 total   CHOLECYSTECTOMY  2011   SPINE SURGERY  2021   TONSILLECTOMY AND ADENOIDECTOMY  1975   TUBAL LIGATION     Patient Active Problem List   Diagnosis Date Noted   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   H/O of hemilaminectomy 02/15/2019   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   Fatigue 09/23/2017   History of pneumonia 04/30/2016   Grief reaction 04/30/2016   Vitamin D deficiency 02/22/2015   Hyperlipidemia 01/01/2015   Visit for preventive health examination 12/04/2013   Lower extremity edema 09/02/2013   Essential hypertension 09/02/2013   History of cardiomyopathy 09/02/2013   SVT (supraventricular tachycardia) 09/02/2013   Contrast media allergy 03/10/2013   Fibrocystic breast disease    Morbid obesity (HCC) 02/05/2012   Meniere disease     REFERRING DIAG:  M79.18 (ICD-10-CM) - Myalgia, other site  Z98.890 (ICD-10-CM) - Other specified postprocedural states  M54.12 (ICD-10-CM) -  Radiculopathy, cervical region    THERAPY DIAG:  Joint stiffness of spine  Pain in right arm  Muscle weakness (generalized)  PERTINENT HISTORY: See evaluation  PRECAUTIONS: N/A  SUBJECTIVE:   Patient reports pain in L side of neck and upper trap region. Slight pain in R cervical region (~C2-3)   PAIN:  Are you having pain? Yes: NPRS scale: 5/10 neck/ R arm ("numbness/tingling in pinky/ring finger").   Pain location: Neck Pain description: aching/ stiff/ tight Aggravating factors: prolonged sitting/ activity Relieving factors: stretches/ PT  12/5: Cervical AROM: L rotn.: 70 deg./ R rotn. 51 deg.  (R cervical tightness/ catching).  L UT feels tight.   Cervical flexion 60 deg./ extension 38 deg.  L lat. Flexion 38 deg./ R lat. Flexion 38 deg.  Seated B shoulder flexion/ abduction 4/5 MMT (increase R UT/ shoulder discomfort).  Grip: L 76.7#/ R 52.1#.  Reassessed R hand/finger manual dexterity.    Today's Treatment: 09/02/22  Manual Therapy:   Supine STM on bilateral cervical paraspinals, levator scap, upper trap, and suboccipitals; (+) trigger point R/L UT and suboccipitals Suboccipital release; Supine levator scap stretches: 2 x 30 sec bilaterally; Supine UT stretches with GHJ OP 2 x 30 sec bilaterally;    Trigger Point Dry Needling (TDN), unbilled Education previously performed with patient regarding potential benefit of TDN. Previously reviewed precautions and  risks with patient. Pt provided verbal consent to treatment. In prone using clean technique TDN performed to bilateral upper traps with 4, 0.25 x 40 single needle placements (2 on each side).  Also performed TDN to bilateral cervical multifidi with 2, 0.25 x 40 single needle placements (one on each side at C3) with deep ache reported. Pistoning technique utilized with periods of static hold. No excessive soreness reported today following intervention;   Application of Biofreeze at end of tx.     PATIENT  EDUCATION: Education details: Plan of care Person educated: Patient Education method: Explanation, Demonstration, and Handouts Education comprehension: verbalized understanding and returned demonstration   HOME EXERCISE PROGRAM: [id number].  HEP: [id number]             PT LONG TERM GOAL 1    Title Pt will improve worse cervical pain by at least 3 NPS points to allow increased tolerance during work and home related ADLs.     Baseline NPS: 8/10 current, 7/10 best, 10/10 Worse. 10/20: Worse pain 7/10.  10/12: 5/10 neck pain at worst (improving)- stress related.  4/15: 4/10. 6/13: 5/10    Time 8     Period Weeks     Status Partially Met     Target Date 09/04/2022          PT LONG TERM GOAL 2    Title Pt will improve bilat UE MMT by atleast 1/2 grade to facilitate greater functional abilities with ADLs related to caregiving.     Baseline UE MMT R/L:   Shoulder Flexion: 4/4-  Shoulder abduction: 4/4-  Shoulder ER: 4/4  Shoulder IR: 4/4  Elbow ext: 5/5   Eblow flex: 5/5 10/20: UE MMT R/L:   Shoulder Flexion: 4/4  Shoulder abduction: 4/4 Shoulder ER: 4/4  Shoulder IR: 4/4  Elbow ext: 5/5   Elbow flex: 5/5.  10/12: B shoulder flexion 4/5 MMT, abduction 4/5 MMT biceps/triceps 5/5 MMT.  6/13: B shoulder flexion 4/5 MMT, abduction 4/5 MMT biceps/triceps 5/5 MMT.  IR/ER 4+/5     Time 8     Period Weeks     Status Partially Met     Target Date 09/04/2022           PT LONG TERM GOAL 3    Title Pt will improve functional R hand strength equal to L to promote return of hand useage during grooming and feeding.     Baseline Grip R/L: 76.1 / 33.9    Lateral prehension grip R/L: 14 / 4 10/20: Grip R/L: 62.0 / 41.4    Lateral prehension grip R/L: 13 / 6.  1/12:  R 69#/ L 49.8#.  6/13: see above    Time 8     Period Weeks     Status Partially Met     Target Date 09/04/2022            Plan -     Clinical Impression Statement  Continued treatment focus on manual therapy for range of motion, tissue  extensibility, and pain control. Good cervical AROM (all planes). Encouraged continuing HEP and follow-up as scheduled.  Pt will continue to benefit from skilled PT to reduce cervical pain with ADL's and work related tasks.       Personal Factors and Comorbidities Comorbidity 2;Profession;Past/Current Experience    Comorbidities Hypertension, Obesity    Examination-Activity Limitations Squat;Locomotion Level    Examination-Participation Restrictions Occupation;Cleaning;Laundry    Stability/Clinical Decision Making Evolving/Moderate complexity    Clinical Decision Making Moderate  Rehab Potential Good    PT Frequency 1-2x/biweekly   PT Duration 8 weeks    PT Treatment/Interventions ADLs/Self Care Home Management;Aquatic Therapy;Biofeedback;Cryotherapy;Electrical Stimulation;Moist Heat;Ultrasound;Functional mobility training;Therapeutic activities;Therapeutic exercise;Balance training;Neuromuscular re-education;Patient/family education;Manual techniques;Scar mobilization;Passive range of motion;Dry needling;Energy conservation;Taping;Gait training;Stair training;DME Instruction;Spinal Manipulations;Joint Manipulations, Canalith repositioning procedure.    PT Next Visit Plan Trigger point release technique/ Hypervolt.  Attempt supine TDN to UT musculature.     PT Home Exercise Plan [id number].   HEP: [id number]    Consulted and Agree with Plan of Care Patient         Cammie Mcgee, PT, DPT # [phone] Physical Therapist- [facility]  [facility]  09/06/2022, 10:19 AM

## 2022-09-09 ENCOUNTER — Ambulatory Visit: Payer: BC Managed Care – PPO | Admitting: Physical Therapy

## 2022-09-09 DIAGNOSIS — M6281 Muscle weakness (generalized): Secondary | ICD-10-CM

## 2022-09-09 DIAGNOSIS — M79601 Pain in right arm: Secondary | ICD-10-CM

## 2022-09-09 DIAGNOSIS — M2560 Stiffness of unspecified joint, not elsewhere classified: Secondary | ICD-10-CM | POA: Diagnosis not present

## 2022-09-17 NOTE — Therapy (Signed)
OUTPATIENT PHYSICAL THERAPY TREATMENT/RECERTIFICATION Physical Therapy Progress Note  Dates of reporting period  06/19/22   to  11/04/22   Patient Name: Amanda Humphrey MRN: [id number] DOB:1965-09-30, 57 y.o., female Today's Date: 09/09/22  PCP: Sherlene Shams, MD REFERRING PROVIDER: Sherlene Shams, MD   PT End of Session - 09/17/22 1026     Visit Number 90    Number of Visits 98    Date for PT Re-Evaluation 11/04/22    PT Start Time 0732    PT Stop Time 0817    PT Time Calculation (min) 45 min    Activity Tolerance Patient tolerated treatment well    Behavior During Therapy [facility] for tasks assessed/performed             Past Medical History:  Diagnosis Date   Allergy    Arthritis    Chronic kidney disease    stones   Fibrocystic breast disease 2013   GERD (gastroesophageal reflux disease)    Meniere disease    Migraines    Past Surgical History:  Procedure Laterality Date   ABDOMINAL HYSTERECTOMY  2011   laprascopic supracervical, [place]   APPENDECTOMY  1999   BREAST BIOPSY Right 11/2010    fibrocystic disease, [place]   CESAREAN SECTION     4 total   CHOLECYSTECTOMY  2011   SPINE SURGERY  2021   TONSILLECTOMY AND ADENOIDECTOMY  1975   TUBAL LIGATION     Patient Active Problem List   Diagnosis Date Noted   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   H/O of hemilaminectomy 02/15/2019   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   Fatigue 09/23/2017   History of pneumonia 04/30/2016   Grief reaction 04/30/2016   Vitamin D deficiency 02/22/2015   Hyperlipidemia 01/01/2015   Visit for preventive health examination 12/04/2013   Lower extremity edema 09/02/2013   Essential hypertension 09/02/2013   History of cardiomyopathy 09/02/2013   SVT (supraventricular tachycardia) 09/02/2013   Contrast media allergy 03/10/2013   Fibrocystic breast disease    Morbid obesity (HCC) 02/05/2012   Meniere disease     REFERRING DIAG:  M79.18 (ICD-10-CM) -  Myalgia, other site  Z98.890 (ICD-10-CM) - Other specified postprocedural states  M54.12 (ICD-10-CM) - Radiculopathy, cervical region    THERAPY DIAG:  Joint stiffness of spine  Pain in right arm  Muscle weakness (generalized)  PERTINENT HISTORY: See evaluation  PRECAUTIONS: N/A  SUBJECTIVE:   Patient reports pain in L side of neck and upper trap region. Slight pain in R cervical region (~C2-3)   PAIN:  Are you having pain? Yes: NPRS scale: 5/10 neck/ R arm ("numbness/tingling in pinky/ring finger").   Pain location: Neck Pain description: aching/ stiff/ tight Aggravating factors: prolonged sitting/ activity Relieving factors: stretches/ PT  12/5: Cervical AROM: L rotn.: 70 deg./ R rotn. 51 deg.  (R cervical tightness/ catching).  L UT feels tight.   Cervical flexion 60 deg./ extension 38 deg.  L lat. Flexion 38 deg./ R lat. Flexion 38 deg.  Seated B shoulder flexion/ abduction 4/5 MMT (increase R UT/ shoulder discomfort).  Grip: L 76.7#/ R 52.1#.  Reassessed R hand/finger manual dexterity.    Today's Treatment: 09/09/22  Manual Therapy:   Supine STM on bilateral cervical paraspinals, levator scap, upper trap, and suboccipitals; (+) trigger point R/L UT and suboccipitals Suboccipital release; Supine levator scap stretches: 2 x 30 sec bilaterally; Supine UT stretches with GHJ OP 2 x 30 sec bilaterally;    Trigger Point Dry  Needling (TDN), unbilled Education previously performed with patient regarding potential benefit of TDN. Previously reviewed precautions and risks with patient. Pt provided verbal consent to treatment. In prone using clean technique TDN performed to bilateral upper traps with 4, 0.25 x 40 single needle placements (2 on each side).  Also performed TDN to bilateral cervical multifidi with 2, 0.25 x 40 single needle placements (one on each side at C3) with deep ache reported. Pistoning technique utilized with periods of static hold. No excessive soreness reported  today following intervention;   Application of Biofreeze at end of tx.     PATIENT EDUCATION: Education details: Plan of care Person educated: Patient Education method: Explanation, Demonstration, and Handouts Education comprehension: verbalized understanding and returned demonstration   HOME EXERCISE PROGRAM: [id number].  HEP: [id number]             PT LONG TERM GOAL 1    Title Pt will improve worse cervical pain by at least 3 NPS points to allow increased tolerance during work and home related ADLs.     Baseline NPS: 8/10 current, 7/10 best, 10/10 Worse. 10/20: Worse pain 7/10.  10/12: 5/10 neck pain at worst (improving)- stress related.  4/15: 4/10. 6/13: 5/10    Time 8     Period Weeks     Status Partially Met     Target Date 11/04/2022          PT LONG TERM GOAL 2    Title Pt will improve bilat UE MMT by atleast 1/2 grade to facilitate greater functional abilities with ADLs related to caregiving.     Baseline UE MMT R/L:   Shoulder Flexion: 4/4-  Shoulder abduction: 4/4-  Shoulder ER: 4/4  Shoulder IR: 4/4  Elbow ext: 5/5   Eblow flex: 5/5 10/20: UE MMT R/L:   Shoulder Flexion: 4/4  Shoulder abduction: 4/4 Shoulder ER: 4/4  Shoulder IR: 4/4  Elbow ext: 5/5   Elbow flex: 5/5.  10/12: B shoulder flexion 4/5 MMT, abduction 4/5 MMT biceps/triceps 5/5 MMT.  6/13: B shoulder flexion 4/5 MMT, abduction 4/5 MMT biceps/triceps 5/5 MMT.  IR/ER 4+/5     Time 8     Period Weeks     Status Partially Met     Target Date 11/04/2022           PT LONG TERM GOAL 3    Title Pt will improve functional R hand strength equal to L to promote return of hand useage during grooming and feeding.     Baseline Grip R/L: 76.1 / 33.9    Lateral prehension grip R/L: 14 / 4 10/20: Grip R/L: 62.0 / 41.4    Lateral prehension grip R/L: 13 / 6.  1/12:  R 69#/ L 49.8#.  6/13: see above    Time 8     Period Weeks     Status Partially Met     Target Date 11/04/2022            Plan -     Clinical Impression  Statement  Continued treatment focus on manual therapy for range of motion, tissue extensibility, and pain control. Good cervical AROM (all planes).  Pt. Has benefited from consistent trigger point dry needling to manage pain/ exacerbation of pain symptoms with daily/ work-related tasks. Encouraged continuing HEP and follow-up as scheduled.  Pt will continue to benefit from skilled PT to reduce cervical pain with ADL's and work related tasks.       Personal Factors and  Comorbidities Comorbidity 2;Profession;Past/Current Experience    Comorbidities Hypertension, Obesity    Examination-Activity Limitations Squat;Locomotion Level    Examination-Participation Restrictions Occupation;Cleaning;Laundry    Stability/Clinical Decision Making Evolving/Moderate complexity    Clinical Decision Making Moderate    Rehab Potential Good    PT Frequency 1-2x/biweekly   PT Duration 8 weeks    PT Treatment/Interventions ADLs/Self Care Home Management;Aquatic Therapy;Biofeedback;Cryotherapy;Electrical Stimulation;Moist Heat;Ultrasound;Functional mobility training;Therapeutic activities;Therapeutic exercise;Balance training;Neuromuscular re-education;Patient/family education;Manual techniques;Scar mobilization;Passive range of motion;Dry needling;Energy conservation;Taping;Gait training;Stair training;DME Instruction;Spinal Manipulations;Joint Manipulations, Canalith repositioning procedure.    PT Next Visit Plan Trigger point release technique/ Hypervolt.  Attempt supine TDN to UT musculature.     PT Home Exercise Plan [email or web address].   HEP: [id number]    Consulted and Agree with Plan of Care Patient         Cammie Mcgee, PT, DPT # [phone] Physical Therapist- [facility]  [facility]  09/17/2022, 11:51 AM

## 2022-09-26 IMAGING — CR DG ANKLE COMPLETE 3+V*R*
3 series · 3 of 3 positions shown · non-contrast
Comparison: None.

CLINICAL DATA: Status post trauma.

EXAM:
RIGHT ANKLE - COMPLETE 3+ VIEW

[ankle obl]
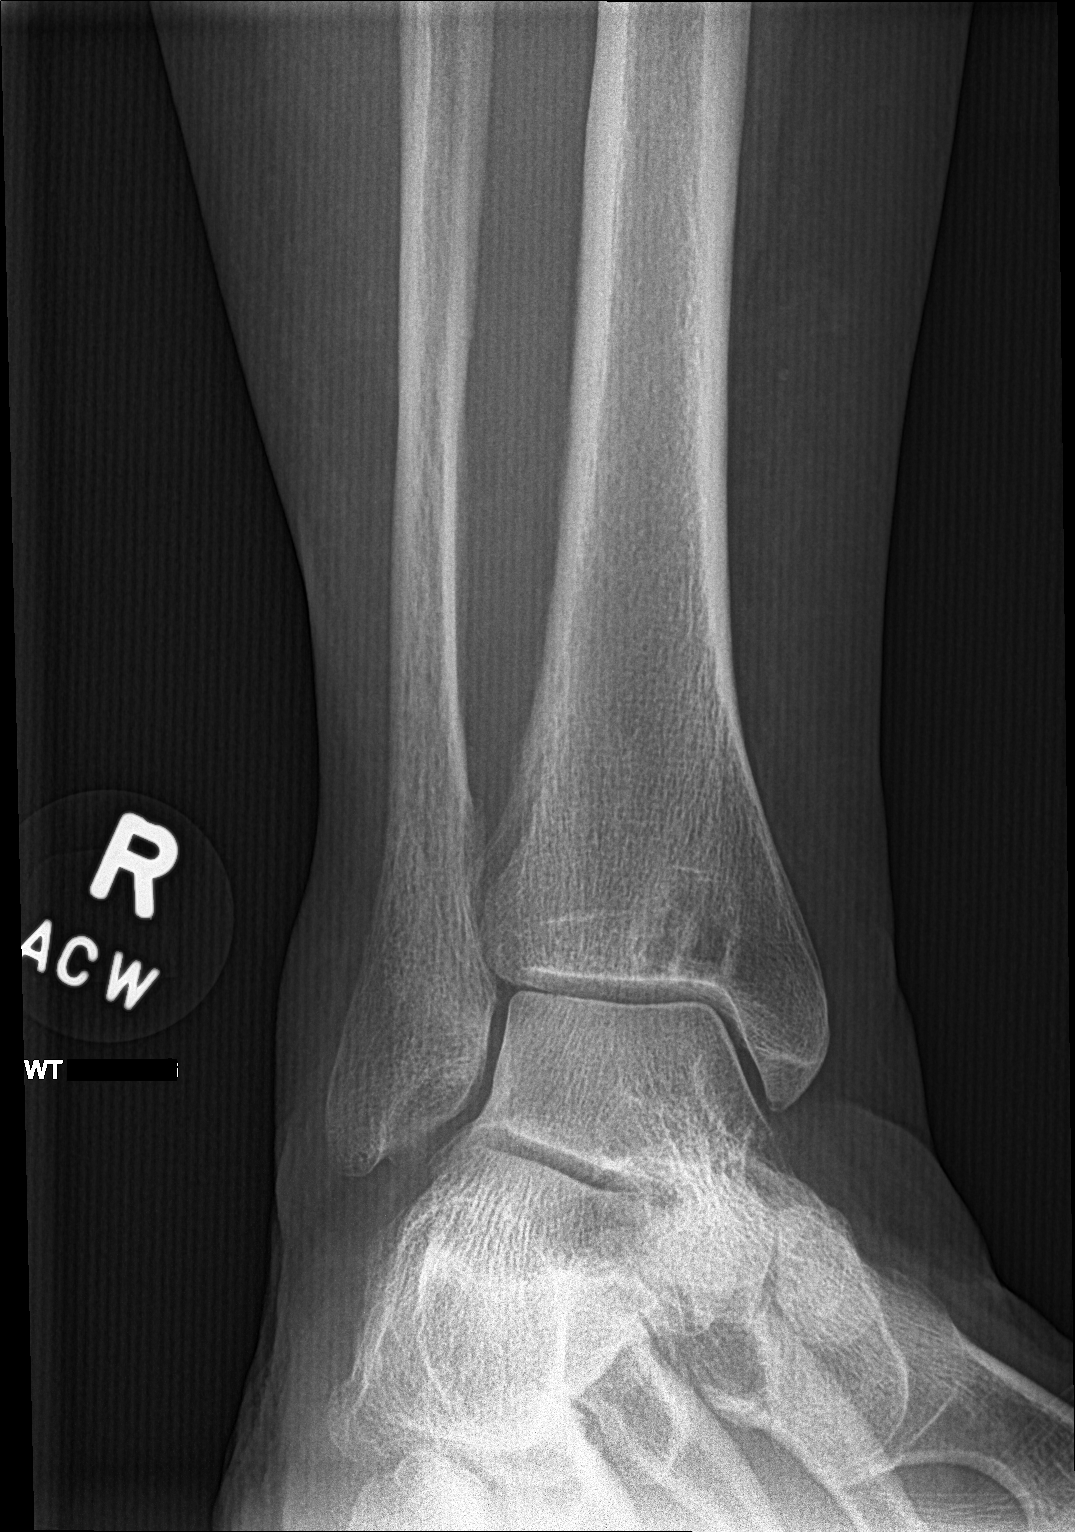

[ankle lat]
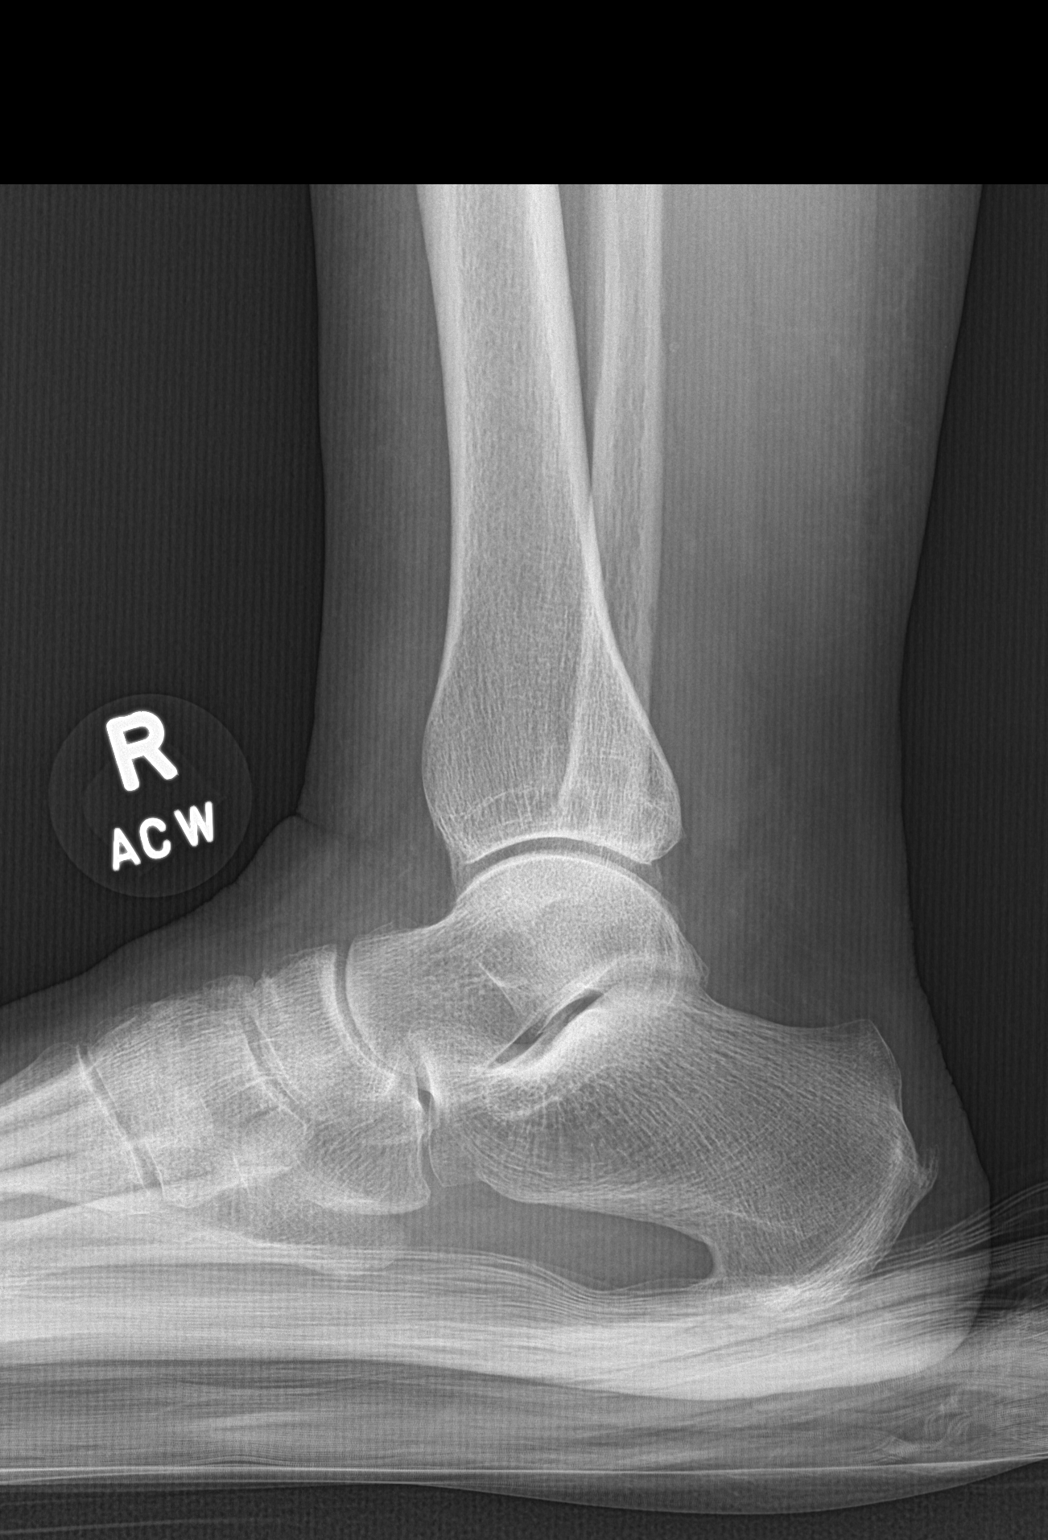

[ankle ap]
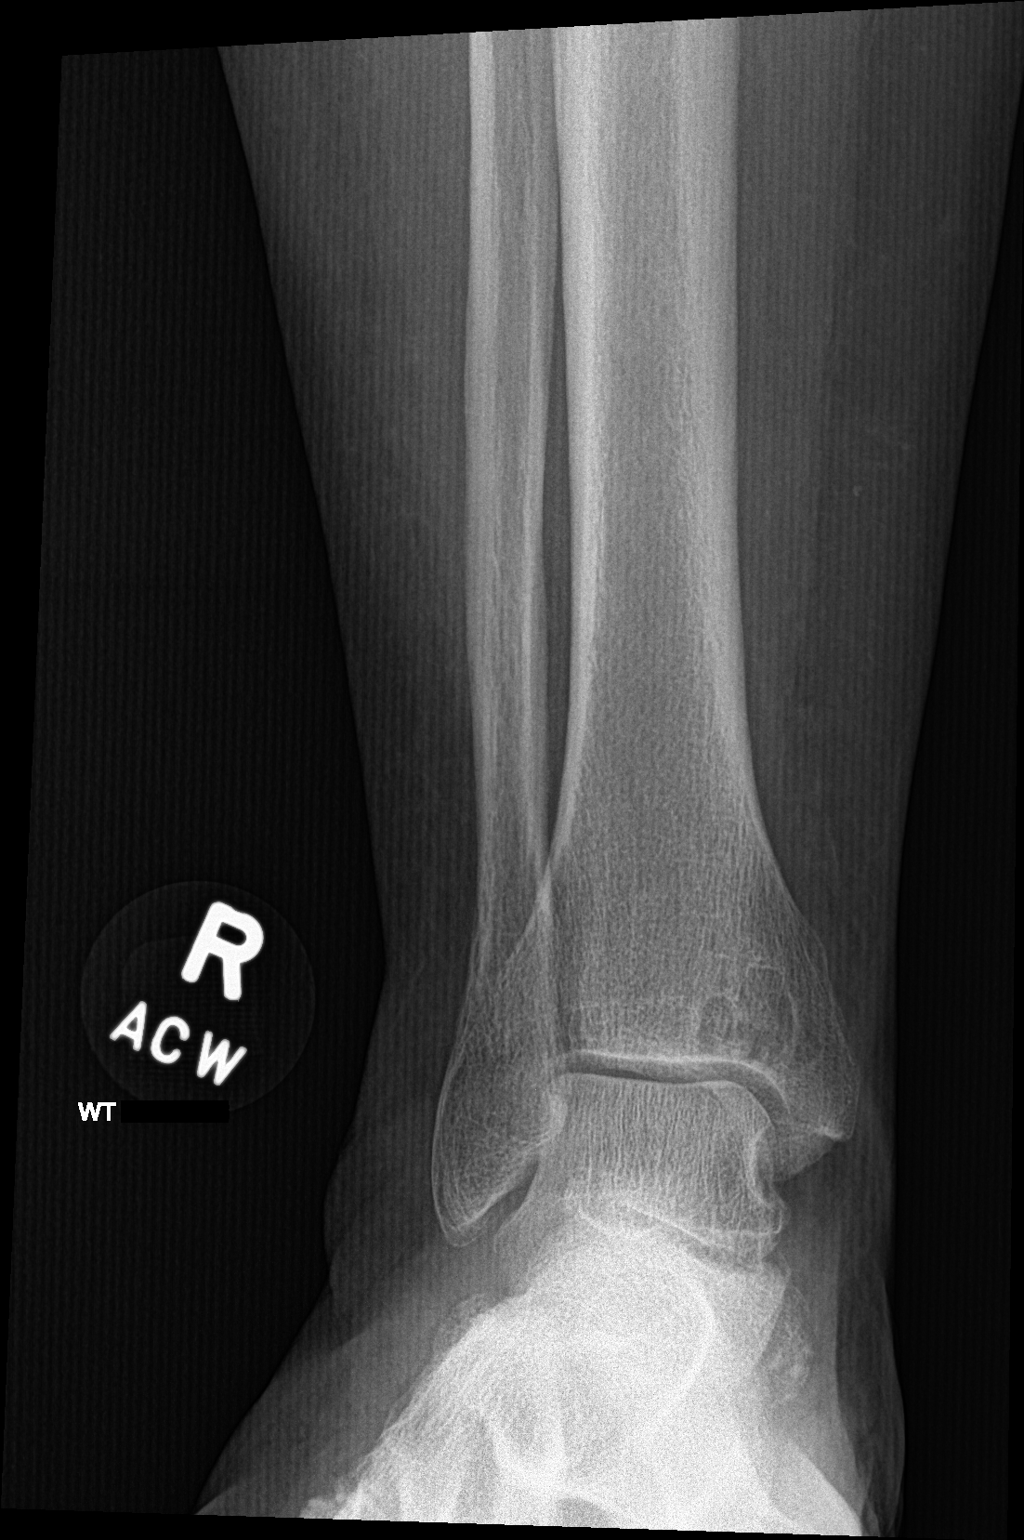

[3 of 3 positions shown; findings below may reference images not displayed]

FINDINGS: There is no evidence of an acute fracture, dislocation, or joint
effusion. There is no evidence of arthropathy. A 1.4 cm x 1.2 cm
benign-appearing predominantly lytic area is seen within the distal
right tibia. Mild diffuse soft tissue swelling is seen.
IMPRESSION: 1. Diffuse soft tissue swelling without an acute osseous
abnormality.
2. Lytic area within the distal right tibia which may be benign in
etiology. Further characterization with nonemergent MRI is
recommended.

## 2022-09-30 ENCOUNTER — Ambulatory Visit: Payer: BC Managed Care – PPO | Attending: Internal Medicine | Admitting: Physical Therapy

## 2022-09-30 DIAGNOSIS — M6281 Muscle weakness (generalized): Secondary | ICD-10-CM | POA: Diagnosis not present

## 2022-09-30 DIAGNOSIS — M2560 Stiffness of unspecified joint, not elsewhere classified: Secondary | ICD-10-CM | POA: Insufficient documentation

## 2022-09-30 DIAGNOSIS — M79601 Pain in right arm: Secondary | ICD-10-CM | POA: Insufficient documentation

## 2022-09-30 DIAGNOSIS — L720 Epidermal cyst: Secondary | ICD-10-CM | POA: Diagnosis not present

## 2022-10-04 NOTE — Therapy (Signed)
OUTPATIENT PHYSICAL THERAPY TREATMENT/RECERTIFICATION   Patient Name: Amanda Humphrey MRN: [id number] DOB:August 16, 1965, 57 y.o., female Today's Date: 09/30/22  PCP: Sherlene Shams, MD REFERRING PROVIDER: Sherlene Shams, MD   PT End of Session - 10/04/22 1629     Visit Number 91    Number of Visits 98    Date for PT Re-Evaluation 11/04/22    PT Start Time 0809    PT Stop Time 0856    PT Time Calculation (min) 47 min    Activity Tolerance Patient tolerated treatment well    Behavior During Therapy [facility] for tasks assessed/performed             Past Medical History:  Diagnosis Date   Allergy    Arthritis    Chronic kidney disease    stones   Fibrocystic breast disease 2013   GERD (gastroesophageal reflux disease)    Meniere disease    Migraines    Past Surgical History:  Procedure Laterality Date   ABDOMINAL HYSTERECTOMY  2011   laprascopic supracervical, [place]   APPENDECTOMY  1999   BREAST BIOPSY Right 11/2010    fibrocystic disease, [place]   CESAREAN SECTION     4 total   CHOLECYSTECTOMY  2011   SPINE SURGERY  2021   TONSILLECTOMY AND ADENOIDECTOMY  1975   TUBAL LIGATION     Patient Active Problem List   Diagnosis Date Noted   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   H/O of hemilaminectomy 02/15/2019   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   Fatigue 09/23/2017   History of pneumonia 04/30/2016   Grief reaction 04/30/2016   Vitamin D deficiency 02/22/2015   Hyperlipidemia 01/01/2015   Visit for preventive health examination 12/04/2013   Lower extremity edema 09/02/2013   Essential hypertension 09/02/2013   History of cardiomyopathy 09/02/2013   SVT (supraventricular tachycardia) 09/02/2013   Contrast media allergy 03/10/2013   Fibrocystic breast disease    Morbid obesity (HCC) 02/05/2012   Meniere disease     REFERRING DIAG:  M79.18 (ICD-10-CM) - Myalgia, other site  Z98.890 (ICD-10-CM) - Other specified postprocedural states   M54.12 (ICD-10-CM) - Radiculopathy, cervical region    THERAPY DIAG:  Joint stiffness of spine  Pain in right arm  Muscle weakness (generalized)  PERTINENT HISTORY: See evaluation  PRECAUTIONS: N/A  SUBJECTIVE:   Pt. States she is scheduled for eyelid surgery next week.  No new complaints and remains pain limited with cervical ROM.  No c/o UE radicular symptoms at this time.     PAIN:  Are you having pain? Yes: NPRS scale: 5/10 neck/ R arm ("numbness/tingling in pinky/ring finger").   Pain location: Neck Pain description: aching/ stiff/ tight Aggravating factors: prolonged sitting/ activity Relieving factors: stretches/ PT  12/5: Cervical AROM: L rotn.: 70 deg./ R rotn. 51 deg.  (R cervical tightness/ catching).  L UT feels tight.   Cervical flexion 60 deg./ extension 38 deg.  L lat. Flexion 38 deg./ R lat. Flexion 38 deg.  Seated B shoulder flexion/ abduction 4/5 MMT (increase R UT/ shoulder discomfort).  Grip: L 76.7#/ R 52.1#.  Reassessed R hand/finger manual dexterity.    Today's Treatment: 10/10/22  Manual Therapy:   Supine STM on bilateral cervical paraspinals, levator scap, upper trap, and suboccipitals; (+) trigger point R/L UT and suboccipitals (use of Hypervolt) Suboccipital release; Supine levator scap stretches: 2 x 30 sec bilaterally; Supine UT stretches with GHJ OP 2 x 30 sec bilaterally;    Trigger Point Dry  Needling (TDN), unbilled Education previously performed with patient regarding potential benefit of TDN. Previously reviewed precautions and risks with patient. Pt provided verbal consent to treatment. In prone using clean technique TDN performed to bilateral upper traps with 4, 0.25 x 40 single needle placements (2 on each side).  Also performed TDN to bilateral cervical multifidi with 5, 0.25 x 40 single needle placements (one on each side at C3) with deep ache reported. Pistoning technique utilized with periods of static hold. No excessive soreness reported  today following intervention;   Application of Biofreeze at end of tx.     PATIENT EDUCATION: Education details: Plan of care Person educated: Patient Education method: Explanation, Demonstration, and Handouts Education comprehension: verbalized understanding and returned demonstration   HOME EXERCISE PROGRAM: [id number].  HEP: [id number]             PT LONG TERM GOAL 1    Title Pt will improve worse cervical pain by at least 3 NPS points to allow increased tolerance during work and home related ADLs.     Baseline NPS: 8/10 current, 7/10 best, 10/10 Worse. 10/20: Worse pain 7/10.  10/12: 5/10 neck pain at worst (improving)- stress related.  4/15: 4/10. 6/13: 5/10    Time 8     Period Weeks     Status Partially Met     Target Date 11/04/2022          PT LONG TERM GOAL 2    Title Pt will improve bilat UE MMT by atleast 1/2 grade to facilitate greater functional abilities with ADLs related to caregiving.     Baseline UE MMT R/L:   Shoulder Flexion: 4/4-  Shoulder abduction: 4/4-  Shoulder ER: 4/4  Shoulder IR: 4/4  Elbow ext: 5/5   Eblow flex: 5/5 10/20: UE MMT R/L:   Shoulder Flexion: 4/4  Shoulder abduction: 4/4 Shoulder ER: 4/4  Shoulder IR: 4/4  Elbow ext: 5/5   Elbow flex: 5/5.  10/12: B shoulder flexion 4/5 MMT, abduction 4/5 MMT biceps/triceps 5/5 MMT.  6/13: B shoulder flexion 4/5 MMT, abduction 4/5 MMT biceps/triceps 5/5 MMT.  IR/ER 4+/5     Time 8     Period Weeks     Status Partially Met     Target Date 11/04/2022           PT LONG TERM GOAL 3    Title Pt will improve functional R hand strength equal to L to promote return of hand useage during grooming and feeding.     Baseline Grip R/L: 76.1 / 33.9    Lateral prehension grip R/L: 14 / 4 10/20: Grip R/L: 62.0 / 41.4    Lateral prehension grip R/L: 13 / 6.  1/12:  R 69#/ L 49.8#.  6/13: see above    Time 8     Period Weeks     Status Partially Met     Target Date 11/04/2022            Plan -     Clinical Impression  Statement  Continued treatment focus on manual therapy for range of motion, tissue extensibility, and pain control. Good cervical AROM (all planes).  Pt. Has benefited from consistent trigger point dry needling to manage pain/ exacerbation of pain symptoms with daily/ work-related tasks. Encouraged continuing HEP and follow-up as scheduled.  Pt will continue to benefit from skilled PT to reduce cervical pain with ADL's and work related tasks.       Personal Factors and  Comorbidities Comorbidity 2;Profession;Past/Current Experience    Comorbidities Hypertension, Obesity    Examination-Activity Limitations Squat;Locomotion Level    Examination-Participation Restrictions Occupation;Cleaning;Laundry    Stability/Clinical Decision Making Evolving/Moderate complexity    Clinical Decision Making Moderate    Rehab Potential Good    PT Frequency 1-2x/biweekly   PT Duration 8 weeks    PT Treatment/Interventions ADLs/Self Care Home Management;Aquatic Therapy;Biofeedback;Cryotherapy;Electrical Stimulation;Moist Heat;Ultrasound;Functional mobility training;Therapeutic activities;Therapeutic exercise;Balance training;Neuromuscular re-education;Patient/family education;Manual techniques;Scar mobilization;Passive range of motion;Dry needling;Energy conservation;Taping;Gait training;Stair training;DME Instruction;Spinal Manipulations;Joint Manipulations, Canalith repositioning procedure.    PT Next Visit Plan Trigger point release technique/ Hypervolt.  Attempt supine TDN to UT musculature.     PT Home Exercise Plan [id number].   HEP: [id number]    Consulted and Agree with Plan of Care Patient         Cammie Mcgee, PT, DPT # [phone] Physical Therapist- [facility]  10/04/2022, 11:51 AM

## 2022-10-07 ENCOUNTER — Ambulatory Visit: Payer: BC Managed Care – PPO | Admitting: Physical Therapy

## 2022-10-07 DIAGNOSIS — H0264 Xanthelasma of left upper eyelid: Secondary | ICD-10-CM | POA: Diagnosis not present

## 2022-10-13 ENCOUNTER — Other Ambulatory Visit: Payer: Self-pay | Admitting: Internal Medicine

## 2022-10-13 DIAGNOSIS — M4692 Unspecified inflammatory spondylopathy, cervical region: Secondary | ICD-10-CM

## 2022-11-10 ENCOUNTER — Ambulatory Visit
Admission: RE | Admit: 2022-11-10 | Discharge: 2022-11-10 | Disposition: A | Discharge status: Home / Self Care | Payer: BC Managed Care – PPO | Source: Ambulatory Visit | Attending: Family Medicine | Admitting: Family Medicine

## 2022-11-10 VITALS — BP 138/60 | HR 75 | Temp 99.1°F | Resp 16 | Ht 67.5 in | Wt 246.0 lb

## 2022-11-10 DIAGNOSIS — J069 Acute upper respiratory infection, unspecified: Secondary | ICD-10-CM | POA: Diagnosis not present

## 2022-11-10 DIAGNOSIS — H66001 Acute suppurative otitis media without spontaneous rupture of ear drum, right ear: Secondary | ICD-10-CM | POA: Diagnosis not present

## 2022-11-10 MED ORDER — CEFDINIR 300 MG PO CAPS: 300.0000 mg | ORAL_CAPSULE | Freq: Two times a day (BID) | ORAL | 0 refills | Status: DC

## 2022-11-10 NOTE — Discharge Instructions (Signed)
Stop by the pharmacy to pick up your prescriptions.  Follow up with your primary care provider as needed.  

## 2022-11-10 NOTE — ED Provider Notes (Signed)
[facility]    CSN: [id number] Arrival date & time: 11/10/22  1302      History   Chief Complaint Chief Complaint  Patient presents with   URI    Appt   Otalgia    HPI MADYLIN FAIRBANK is a 57 y.o. female.   HPI  History obtained from the patient. Christeen presents for headache, nasal congestion, bilateral ear pain, left sinus pressure that started on Wednesday. Had chills and slightly elevated temperature. Has sore throat from drainage.  She feels "yucky all over" and has been sleeping more than normal.   Tried Sudafed, Tylenol  Atrovent, Claritin and tessalon perles.  No vomiting, diarrhea, abdominal pain, changes to appetite or sleep disturbance.  She took a home COVID test that was negative.  She works at [employer] but not with patients.       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   H/O of hemilaminectomy 02/15/2019   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   Fatigue 09/23/2017   History of pneumonia 04/30/2016   Grief reaction 04/30/2016   Vitamin D deficiency 02/22/2015   Hyperlipidemia 01/01/2015   Visit for preventive health examination 12/04/2013   Lower extremity edema 09/02/2013   Essential hypertension 09/02/2013   History of cardiomyopathy 09/02/2013   SVT (supraventricular tachycardia) (HCC) 09/02/2013   Contrast media allergy 03/10/2013   Fibrocystic breast disease    Morbid obesity (HCC) 02/05/2012   Meniere disease     Past Surgical History:  Procedure Laterality Date   ABDOMINAL HYSTERECTOMY  2011   laprascopic supracervical, [place]   APPENDECTOMY  1999   BREAST BIOPSY Right 11/2010    fibrocystic disease, [place]   CESAREAN SECTION     4 total    CHOLECYSTECTOMY  2011   SPINE SURGERY  2021   TONSILLECTOMY AND ADENOIDECTOMY  1975   TUBAL LIGATION      OB History   No obstetric history on file.      Home Medications    Prior to Admission medications   Medication Sig Start Date End Date Taking? Authorizing Provider  amphetamine-dextroamphetamine (ADDERALL) 20 MG tablet Take 20 mg by mouth every evening.  09/22/18  Yes [provider]  benzonatate (TESSALON) 100 MG capsule Take 1 capsule (100 mg total) by mouth 3 (three) times daily as needed. 03/27/22  Yes Sherlene Shams, MD  carvedilol (COREG) 6.25 MG tablet Take 1 tablet (6.25 mg total) by mouth 2 (two) times daily. 11/26/21  Yes Gollan, Tollie Pizza, MD  cefdinir (OMNICEF) 300 MG capsule Take 1 capsule (300 mg total) by mouth 2 (two) times daily. 11/10/22  Yes Annette Bertelson, DO  cyclobenzaprine (FLEXERIL) 10 MG tablet Take 1 tablet (10 mg total) by mouth at bedtime. 03/27/22  Yes Sherlene Shams, MD  Evolocumab (REPATHA) 140 MG/ML SOSY Inject 1 Dose into the skin every 14 (fourteen) days. 11/26/21  Yes Gollan, Tollie Pizza, MD  ezetimibe (ZETIA) 10 MG tablet Take 1 tablet (10 mg total) by mouth daily. 08/20/20  Yes Gollan, Tollie Pizza, MD  furosemide (LASIX) 20 MG tablet Take 1 tablet (20 mg total) by mouth daily as needed. 11/26/21  Yes Antonieta Iba, MD  hydrochlorothiazide (HYDRODIURIL) 25 MG tablet Take 1 tablet (25 mg total) by mouth daily. 11/26/21  Yes Gollan, Tollie Pizza, MD  methocarbamol (ROBAXIN) 500 MG tablet Take 1 tablet (500 mg total) by mouth 2 (two) times daily. 03/27/22  Yes Sherlene Shams, MD  omeprazole (PRILOSEC) 40 MG capsule TAKE 1 CAPSULE DAILY 10/14/22  Yes Sherlene Shams, MD    Family History Family History  Problem Relation Age of Onset   Hypertension Mother    Cancer Mother        ovarian   Arthritis Mother    ADD / ADHD Mother    Hyperlipidemia Father    Hypertension Father    Diabetes Father    Stroke Father    Alzheimer's disease  Maternal Grandmother    Heart disease Maternal Grandmother    Breast cancer Maternal Grandmother    Alzheimer's disease Paternal Grandmother    Breast cancer Paternal Grandmother     Social History Social History   Tobacco Use   Smoking status: Never   Smokeless tobacco: Never  Vaping Use   Vaping status: Never Used  Substance Use Topics   Alcohol use: Yes    Alcohol/week: 1.0 standard drink of alcohol    Types: 1 Glasses of wine per week    Comment: social   Drug use: No     Allergies   Bee venom, Codeine, Hydrocodone, Tramadol, and Oxycodone   Review of Systems Review of Systems: negative unless otherwise stated in HPI.      Physical Exam Triage Vital Signs ED Triage Vitals  Encounter Vitals Group     BP 11/10/22 1325 138/60     Systolic BP Percentile --      Diastolic BP Percentile --      Pulse Rate 11/10/22 1325 75     Resp 11/10/22 1325 16     Temp 11/10/22 1325 99.1 F (37.3 C)     Temp Source 11/10/22 1325 Oral     SpO2 11/10/22 1325 100 %     Weight 11/10/22 1325 246 lb (111.6 kg)     Height 11/10/22 1325 5' 7.5" (1.715 m)     Head Circumference --      Peak Flow --      Pain Score 11/10/22 1327 5     Pain Loc --      Pain Education --      Exclude from Growth Chart --    No data found.  Updated Vital Signs BP 138/60 (BP Location: Left Arm)   Pulse 75   Temp 99.1 F (37.3 C) (Oral)   Resp 16   Ht 5' 7.5" (1.715 m)   Wt 111.6 kg   SpO2 100%   BMI 37.96 kg/m   Visual Acuity Right Eye Distance:   Left Eye Distance:   Bilateral Distance:    Right Eye Near:   Left Eye Near:    Bilateral Near:     Physical Exam GEN:     alert, ill but non-toxic appearing female in no distress    HENT:  mucus membranes moist, oropharyngeal without lesions, mild erythema, no tonsillar hypertrophy or exudates,  moderate erythematous edematous turbinates, purulent yellow nasal discharge, right TM effusion with erythema and bulging, left TM erythema,  maxillary sinus drainage EYES:   pupils equal and reactive, no scleral injection or discharge NECK:  normal ROM, +lymphadenopathy RESP:  no increased work of breathing, clear to auscultation bilaterally CVS:  regular rate and rhythm Skin:   warm and dry, no rash on visible skin    UC Treatments / Results  Labs (all labs ordered are listed, but only abnormal results are displayed) Labs Reviewed - No data to display  EKG   Radiology No results found.  Procedures Procedures (including critical care time)  Medications Ordered in UC Medications - No data to display  Initial Impression / Assessment and Plan / UC Course  I have reviewed the triage vital signs and the nursing notes.  Pertinent labs & imaging results that were available during my care of the patient were reviewed by me and considered in my medical decision making (see chart for details).       Pt is a 57 y.o. female who presents for 5 days of respiratory symptoms. Tanishia is afebrile here though had recent antipyretics, 99.1 F. Satting well on room air. Overall pt is ill but non-toxic appearing, well hydrated, without respiratory distress. Pulmonary exam is unremarkable.  Home COVID testing  was negative. She has evidence of right otitis media and likely beginning maxillary sinusitis.  Treat acute otitis media with cefdinir 300 mg BID for 7 days. Discussed symptomatic treatment.  Typical duration of symptoms discussed.   Return and ED precautions given and voiced understanding. Discussed MDM, treatment plan and plan for follow-up with patient who agrees with plan.     Final Clinical Impressions(s) / UC Diagnoses   Final diagnoses:  Non-recurrent acute suppurative otitis media of right ear without spontaneous rupture of tympanic membrane  Upper respiratory tract infection, unspecified type     Discharge Instructions      Stop by the pharmacy to pick up your prescriptions.  Follow up with your primary care  provider as needed.      ED Prescriptions     Medication Sig Dispense Auth. Provider   cefdinir (OMNICEF) 300 MG capsule Take 1 capsule (300 mg total) by mouth 2 (two) times daily. 14 capsule Charmine Bockrath, DO      PDMP not reviewed this encounter.   Katha Cabal, DO 11/10/22 1413

## 2022-11-10 NOTE — ED Triage Notes (Signed)
Pt c/o HA, sinus pressure & bilateral ear pain R>L x5 days. Has tried tylenol,sudafed & benzonatate w/o relief.

## 2022-11-15 IMAGING — CT CT CARDIAC CORONARY ARTERY CALCIUM SCORE
3 series · 14 of 20 positions shown, 16 images · non-contrast
Comparison: 09/22/2017 chest radiograph.  Chest CT 11/21/2003.
COMPARISON: 09/22/2017 chest radiograph.  Chest CT 11/21/2003.

Addendum:
EXAM:
OVER-READ INTERPRETATION  CT CHEST

The following report is an over-read performed by radiologist Dr.
Komal Kaiser [REDACTED] on 09/13/2020. This over-read
does not include interpretation of cardiac or coronary anatomy or
pathology. The calcium score interpretation by the cardiologist is
attached.
CLINICAL DATA: Risk stratification
Coronary Calcium Score
TECHNIQUE: The patient was scanned on a Siemens Somatom go.Top Scanner. Axial
non-contrast 3 mm slices were carried out through the heart. The
data set was analyzed on a dedicated work station and scored using
the Agatson method.

[Series 2: sa36 calcium scoring 3.00 · axial · 0.34mm/px · z∈[-1082,-1001]mm · 4 of 46 slices shown]
[im 10/46  vessel]
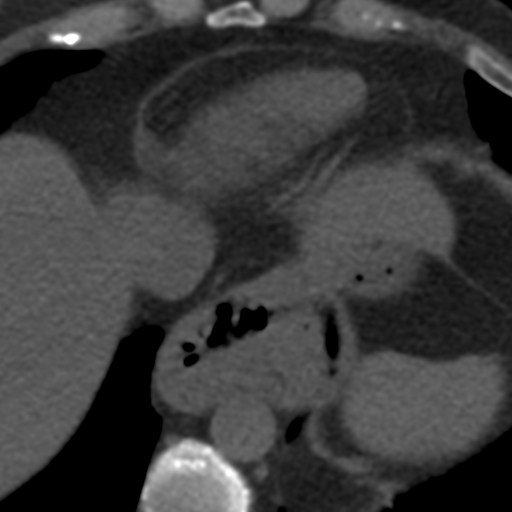
[im 19/46  vessel]
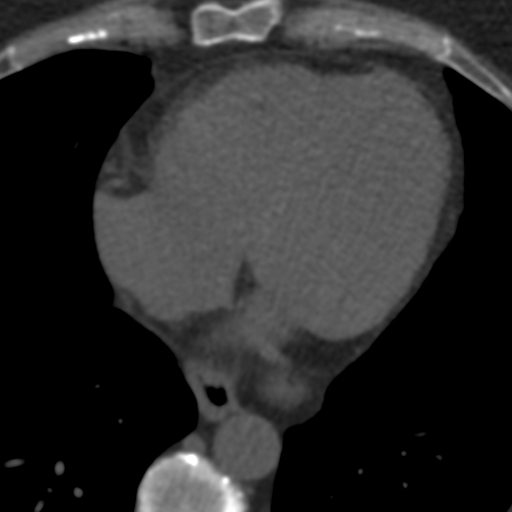
[im 28/46  vessel]
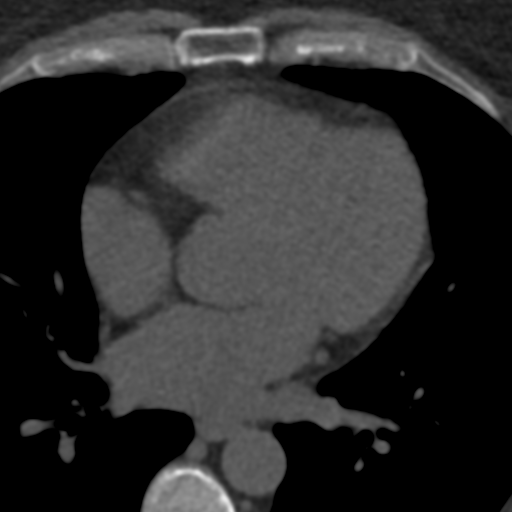
[im 37/46  vessel]
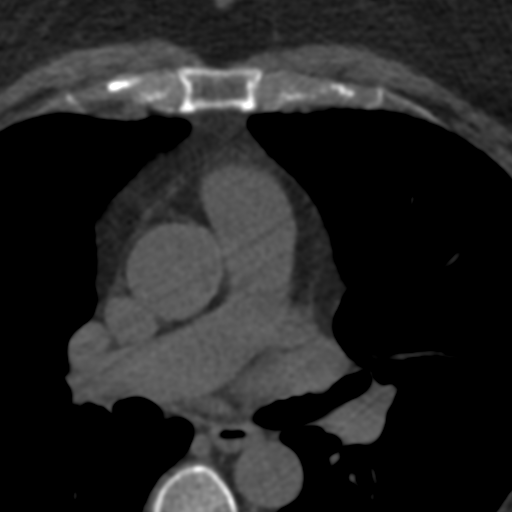

[Series 5: full fov st calcium scoring 3.00 · axial · 0.70mm/px · z∈[-1088,-998]mm · 5 of 46 slices shown, 7 images]
[im 8/46  vessel]
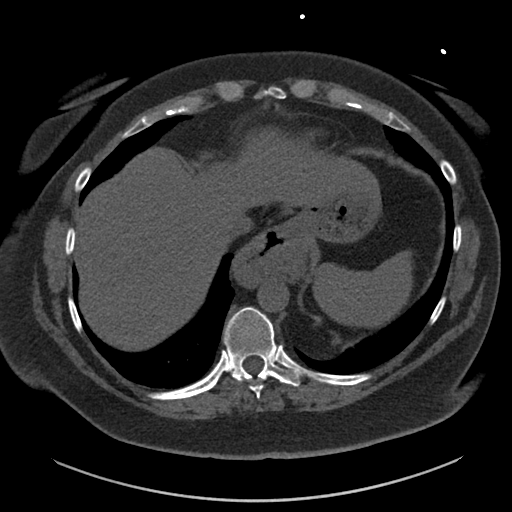
[im 8/46  lung]
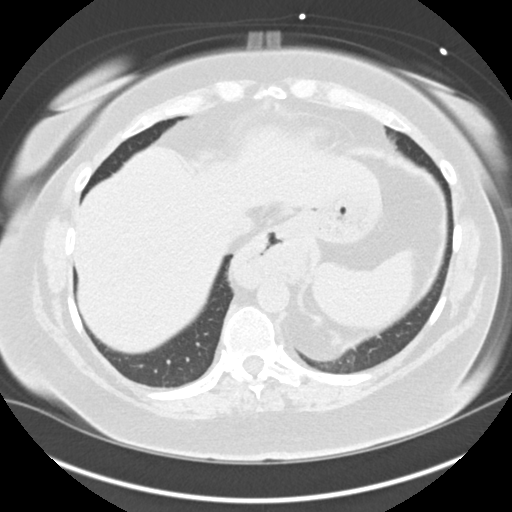
[im 16/46  vessel]
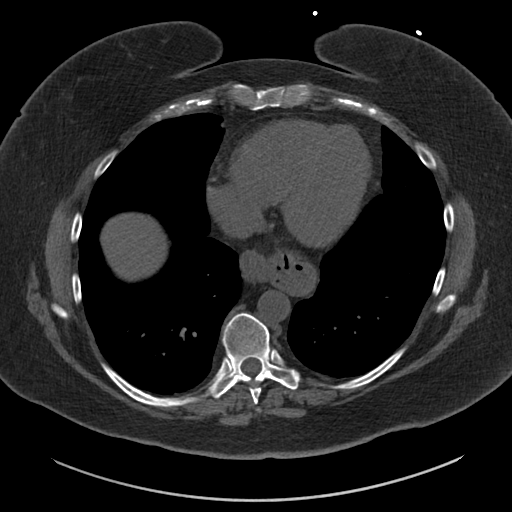
[im 23/46  vessel]
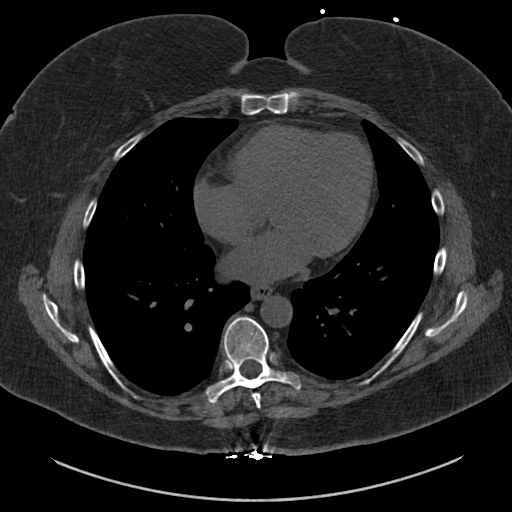
[im 31/46  vessel]
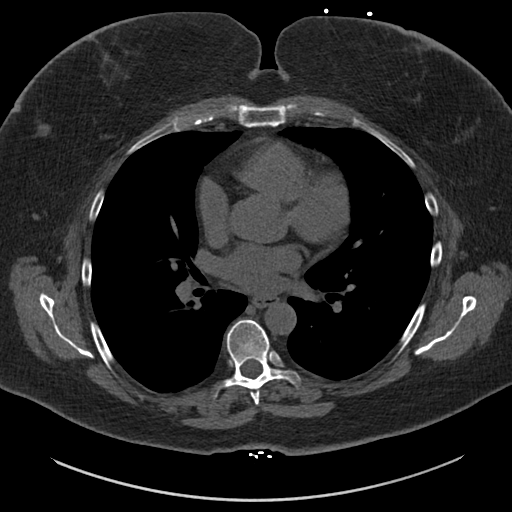
[im 38/46  vessel]
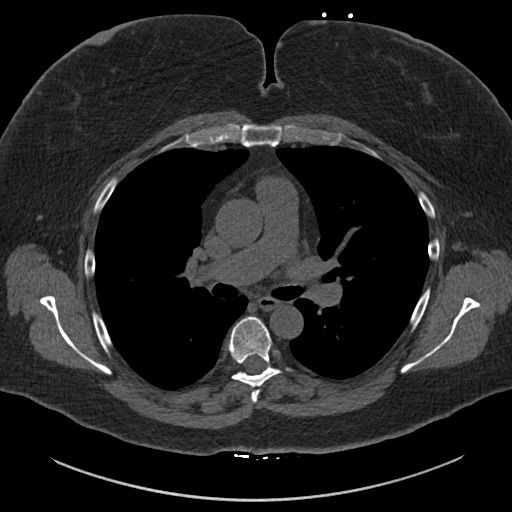
[im 38/46  lung]
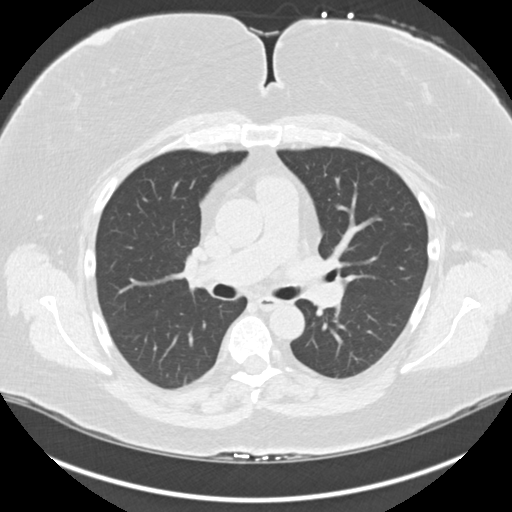

[Series 10: full fov lungs calcium scoring 3.00 ax · axial · 0.70mm/px · z∈[-1088,-998]mm · 5 of 46 slices shown]
[im 8/46  vessel]
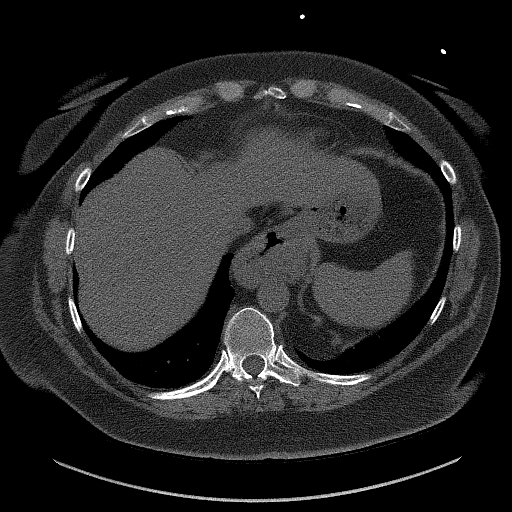
[im 16/46  vessel]
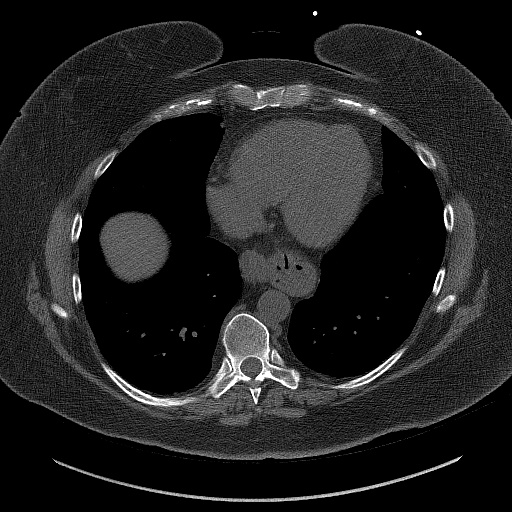
[im 23/46  vessel]
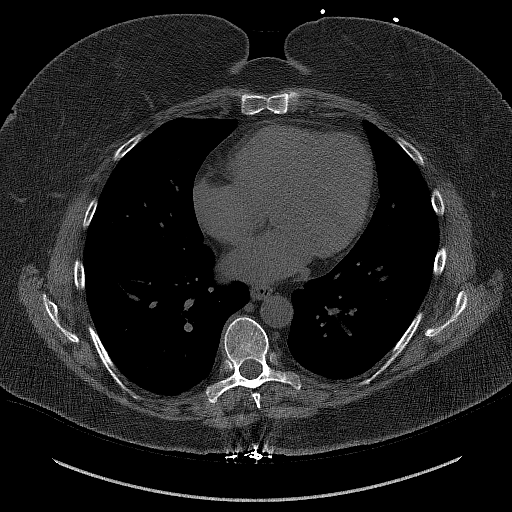
[im 31/46  vessel]
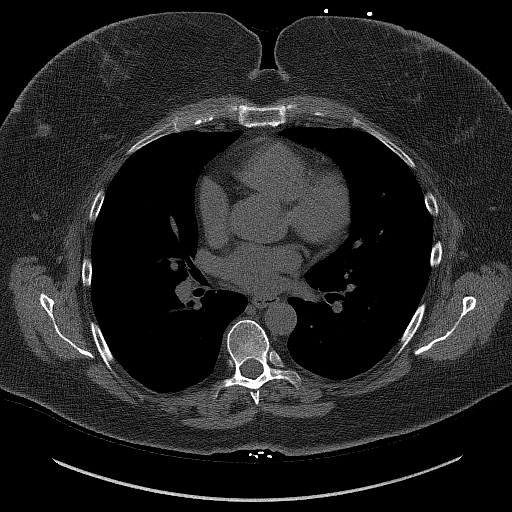
[im 38/46  vessel]
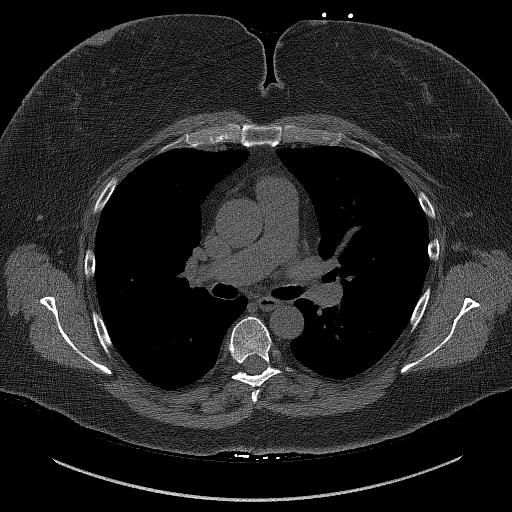

[14 of 20 positions shown; findings below may reference images not displayed]

FINDINGS: Vascular: Aortic atherosclerosis.

Mediastinum/Nodes: No imaged thoracic adenopathy. A moderate hiatal
hernia.

Lungs/Pleura: No pleural fluid.  Clear imaged lungs.

Upper Abdomen: Normal imaged portions of the liver, spleen.

Musculoskeletal: No acute osseous abnormality.
IMPRESSION: 1. No acute findings in the imaged extracardiac chest.
2. Moderate hiatal hernia.
3. Aortic Atherosclerosis (LIR5I-XS8.8).
FINDINGS: Non-cardiac: See separate report from [REDACTED].

Ascending Aorta: Normal size

Pericardium: Normal

Coronary arteries: Normal origin of left and right coronary
arteries. Distribution of arterial calcifications if present, as
noted below;

LM 0

LAD

LCx 0

RCA 0

Total

IMPRESSION AND RECOMMENDATION:
1. Coronary calcium score of 21.1. This was 84th percentile for age
and sex matched control.

2. CAC 1-99 in LAD. SHERLY PERVEZ1/N1.

3. Continue heart healthy lifestyle and risk factor modification.

Cristina Elizabeth Ayachi

*** End of Addendum ***
EXAM:
OVER-READ INTERPRETATION  CT CHEST

The following report is an over-read performed by radiologist Dr.
Komal Kaiser [REDACTED] on 09/13/2020. This over-read
does not include interpretation of cardiac or coronary anatomy or
pathology. The calcium score interpretation by the cardiologist is
attached.
FINDINGS: Vascular: Aortic atherosclerosis.

Mediastinum/Nodes: No imaged thoracic adenopathy. A moderate hiatal
hernia.

Lungs/Pleura: No pleural fluid.  Clear imaged lungs.

Upper Abdomen: Normal imaged portions of the liver, spleen.

Musculoskeletal: No acute osseous abnormality.
IMPRESSION: 1. No acute findings in the imaged extracardiac chest.
2. Moderate hiatal hernia.
3. Aortic Atherosclerosis (LIR5I-XS8.8).

## 2022-11-27 ENCOUNTER — Other Ambulatory Visit: Payer: Self-pay | Admitting: Cardiovascular Disease

## 2022-12-01 NOTE — Progress Notes (Unsigned)
Cardiology Office Note  Date:  12/02/2022   ID:  Amanda Humphrey, DOB 06-24-1965, MRN [id number]  PCP:  Sherlene Shams, MD   Chief Complaint  Patient presents with   12 month follow up     "Doing well." Medications reviewed by the patient verbally.     HPI:  Amanda Humphrey is a very pleasant 57 year-old woman with remote history of cardiomyopathy approximately 20 years ago after having her third child with mild depression of her ejection fraction which returned back to normal 6 months later,  periodic SVT, palpitations,  lower extremity swelling  Coronary calcification score 21 Hyperlipidemia who presents for routine follow-up of her arrhythmia, hypertension, fluid retention following neck surgery January 2021, hyperlipidemia  Last seen by myself in clinic October 2023  Repatha not covered by [employer] On zetia, rare muscle spasms, sometimes has to take several days off History of statin intolerance, myalgias with crest  Interested in retrying with local pharmacy  Busy at work, denies significant tachypalpitations Rare symptoms when active at work while going up and down stairs, has to take a minute to recover  No significant lower extremity edema Stress at home, husband with medical issues  EKG personally reviewed by myself on todays visit EKG Interpretation Date/Time:  Tuesday December 02 2022 13:50:49 EST Ventricular Rate:  70 PR Interval:  118 QRS Duration:  152 QT Interval:  414 QTC Calculation: 447 R Axis:   69  Text Interpretation: Normal sinus rhythm Right bundle branch block T wave abnormality, consider inferior ischemia When compared with ECG of 13-Dec-2010 11:35, No significant change was found Confirmed by Julien Nordmann [phone]) on 12/02/2022 1:55:51 PM   COVID-positive September 2023 Treated with Paxlovid, prednisone  [facility] records from neurosurgery reviewed status post right C7-T1 discectomy and foraminotomy/hemilaminectomy February 08, 2019 Performed at  [facility] procedure orthostatic hypotension, treated with fluid boluses Taking Valium, gabapentin for muscle spasms Recent office note from neurosurgery indicates she is on Lasix Working with physical therapy, initially using a walker for stability Residual numbness right hand, Improvement in grip and dexterity C8 nerve distribution And ability to tolerate narcotics  post decompression nerve root edema accentuated by postop fluid boluses for postop postural hypotension and apparent allergic reaction to Dilaudid and some rebound swelling upon completion of Decadron taper.  Prior history of cardiomyopathy many years ago that seemed to significantly improve with aggressive diuresis. Approximately 18 years ago, Lasix was provided with 16 pound weight loss and improvement of her symptoms. On her fourth child she had fluid retention but no recurrent cardiomyopathy   prior echocardiogram 12/16/2010 showing normal ejection fraction rate and 55%, mild MR    PMH:   has a past medical history of Allergy, Arthritis, Chronic kidney disease, Fibrocystic breast disease (2013), GERD (gastroesophageal reflux disease), Meniere disease, and Migraines.  PSH:    Past Surgical History:  Procedure Laterality Date   ABDOMINAL HYSTERECTOMY  2011   laprascopic supracervical, [place]   APPENDECTOMY  1999   BREAST BIOPSY Right 11/2010    fibrocystic disease, [place]   CESAREAN SECTION     4 total   CHOLECYSTECTOMY  2011   SPINE SURGERY  2021   TONSILLECTOMY AND ADENOIDECTOMY  1975   TUBAL LIGATION      Current Outpatient Medications  Medication Sig Dispense Refill   amphetamine-dextroamphetamine (ADDERALL) 20 MG tablet Take 20 mg by mouth every evening.      benzonatate (TESSALON) 100 MG capsule Take 1 capsule (100 mg total)  by mouth 3 (three) times daily as needed. 60 capsule 5   carvedilol (COREG) 6.25 MG tablet TAKE 1 TABLET TWICE A DAY (PLEASE CALL OUR OFFICE AND SCHEDULE YEARLY APPOINTMENT FOR FURTHER  REFILLS) 180 tablet 0   cyclobenzaprine (FLEXERIL) 10 MG tablet Take 1 tablet (10 mg total) by mouth at bedtime. 90 tablet 1   ezetimibe (ZETIA) 10 MG tablet Take 1 tablet (10 mg total) by mouth daily. 90 tablet 3   furosemide (LASIX) 20 MG tablet TAKE 1 TABLET DAILY AS NEEDED 90 tablet 0   hydrochlorothiazide (HYDRODIURIL) 25 MG tablet TAKE 1 TABLET DAILY 90 tablet 0   methocarbamol (ROBAXIN) 500 MG tablet Take 1 tablet (500 mg total) by mouth 2 (two) times daily. 270 tablet 3   omeprazole (PRILOSEC) 40 MG capsule TAKE 1 CAPSULE DAILY 90 capsule 3   cefdinir (OMNICEF) 300 MG capsule Take 1 capsule (300 mg total) by mouth 2 (two) times daily. (Patient not taking: Reported on 12/02/2022) 14 capsule 0   Evolocumab (REPATHA) 140 MG/ML SOSY Inject 1 Dose into the skin every 14 (fourteen) days. (Patient not taking: Reported on 12/02/2022) 2 mL 6   No current facility-administered medications for this visit.    Allergies:   Bee venom, Codeine, Hydrocodone, Tramadol, and Oxycodone   Social History:  The patient  reports that she has never smoked. She has never used smokeless tobacco. She reports current alcohol use of about 1.0 standard drink of alcohol per week. She reports that she does not use drugs.   Family History:   family history includes ADD / ADHD in her mother; Alzheimer's disease in her maternal grandmother and paternal grandmother; Arthritis in her mother; Breast cancer in her maternal grandmother and paternal grandmother; Cancer in her mother; Diabetes in her father; Heart disease in her maternal grandmother; Hyperlipidemia in her father; Hypertension in her father and mother; Stroke in her father.    Review of Systems: Review of Systems  Constitutional: Negative.   HENT: Negative.    Respiratory: Negative.    Cardiovascular: Negative.   Gastrointestinal: Negative.   Musculoskeletal: Negative.   Neurological: Negative.   Psychiatric/Behavioral: Negative.    All other systems  reviewed and are negative.   PHYSICAL EXAM: VS:  BP 130/76 (BP Location: Left Arm, Patient Position: Sitting, Cuff Size: Large)   Pulse 70   Ht 5\' 8"  (1.727 m)   Wt 260 lb (117.9 kg)   SpO2 97%   BMI 39.53 kg/m  , BMI Body mass index is 39.53 kg/m. Constitutional:  oriented to person, place, and time. No distress.  HENT:  Head: Grossly normal Eyes:  no discharge. No scleral icterus.  Neck: No JVD, no carotid bruits  Cardiovascular: Regular rate and rhythm, no murmurs appreciated Pulmonary/Chest: Clear to auscultation bilaterally, no wheezes or rails Abdominal: Soft.  no distension.  no tenderness.  Musculoskeletal: Normal range of motion Neurological:  normal muscle tone. Coordination normal. No atrophy Skin: Skin warm and dry Psychiatric: normal affect, pleasant  Recent Labs: 03/27/2022: ALT 16; BUN 13; Creatinine, Ser 0.89; Hemoglobin 13.1; Platelets 335.0; Potassium 4.4; Sodium 140; TSH 1.22    Lipid Panel Lab Results  Component Value Date   CHOL 239 (H) 03/27/2022   HDL 52.40 03/27/2022   LDLCALC 168 (H) 03/20/2021   TRIG 219.0 (H) 03/27/2022    Wt Readings from Last 3 Encounters:  12/02/22 260 lb (117.9 kg)  11/10/22 246 lb (111.6 kg)  03/27/22 259 lb 3.2 oz (117.6 kg)  ASSESSMENT AND PLAN:  SVT (supraventricular tachycardia) (HCC) Symptoms relatively well-controlled on carvedilol 6.25 twice daily Could take extra carvedilol for breakthrough tachycardia  Essential hypertension Blood pressure is well controlled on today's visit. No changes made to the medications.  Coronary artery disease/aortic atherosclerosis History of mixed hyperlipidemia Not on Crestor secondary to statin myalgia  Given coronary artery disease, will prescribe Repatha 140 mg subcu every 2 weeks.  Continue Zetia  Lower extremity edema Symptoms are stable on HCTZ  Rarely uses Lasix   Obesity With cardiovascular disease including coronary disease and aortic  atherosclerosis Prescription sent in for [facility]  Hyperlipidemia LDL 160 Prescription sent in for Repatha and Wegovy  Orders Placed This Encounter  Procedures   EKG 12-Lead     Signed, Dossie Arbour, M.D., Ph.D. 12/02/2022  [facility] [place], [place] [phone]

## 2022-12-02 ENCOUNTER — Other Ambulatory Visit: Payer: Self-pay

## 2022-12-02 ENCOUNTER — Ambulatory Visit: Payer: BC Managed Care – PPO | Attending: Cardiovascular Disease | Admitting: Cardiovascular Disease

## 2022-12-02 ENCOUNTER — Encounter: Payer: Self-pay | Admitting: Cardiovascular Disease

## 2022-12-02 VITALS — BP 130/76 | HR 70 | Ht 68.0 in | Wt 260.0 lb

## 2022-12-02 DIAGNOSIS — I10 Essential (primary) hypertension: Secondary | ICD-10-CM

## 2022-12-02 DIAGNOSIS — E782 Mixed hyperlipidemia: Secondary | ICD-10-CM

## 2022-12-02 DIAGNOSIS — T466X5D Adverse effect of antihyperlipidemic and antiarteriosclerotic drugs, subsequent encounter: Secondary | ICD-10-CM

## 2022-12-02 DIAGNOSIS — R002 Palpitations: Secondary | ICD-10-CM

## 2022-12-02 DIAGNOSIS — I471 Supraventricular tachycardia, unspecified: Secondary | ICD-10-CM | POA: Diagnosis not present

## 2022-12-02 DIAGNOSIS — Z8679 Personal history of other diseases of the circulatory system: Secondary | ICD-10-CM

## 2022-12-02 DIAGNOSIS — I700 Atherosclerosis of aorta: Secondary | ICD-10-CM

## 2022-12-02 DIAGNOSIS — I2510 Atherosclerotic heart disease of native coronary artery without angina pectoris: Secondary | ICD-10-CM

## 2022-12-02 DIAGNOSIS — M791 Myalgia, unspecified site: Secondary | ICD-10-CM

## 2022-12-02 MED ORDER — EZETIMIBE 10 MG PO TABS: 10.0000 mg | ORAL_TABLET | Freq: Every day | ORAL | 3 refills | Status: DC

## 2022-12-02 MED ORDER — HYDROCHLOROTHIAZIDE 25 MG PO TABS: 25.0000 mg | ORAL_TABLET | Freq: Every day | ORAL | 3 refills | Status: DC

## 2022-12-02 MED ORDER — REPATHA SURECLICK 140 MG/ML ~~LOC~~ SOAJ
140.0000 mg | SUBCUTANEOUS | 3 refills | Status: DC
  Filled 2022-12-02: qty 2, 28d supply, fill #0

## 2022-12-02 MED ORDER — SEMAGLUTIDE-WEIGHT MANAGEMENT 0.25 MG/0.5ML ~~LOC~~ SOAJ
0.2500 mg | SUBCUTANEOUS | 0 refills | Status: DC
Start: 2022-12-02 — End: 2022-12-08
  Filled 2022-12-02: qty 2, 28d supply, fill #0

## 2022-12-02 MED ORDER — SEMAGLUTIDE-WEIGHT MANAGEMENT 0.5 MG/0.5ML ~~LOC~~ SOAJ
0.5000 mg | SUBCUTANEOUS | 0 refills | Status: AC
  Filled 2022-12-02: qty 2, 28d supply, fill #0

## 2022-12-02 MED ORDER — FUROSEMIDE 20 MG PO TABS: 20.0000 mg | ORAL_TABLET | Freq: Every day | ORAL | 3 refills | Status: DC | PRN

## 2022-12-02 MED ORDER — CARVEDILOL 6.25 MG PO TABS: 6.2500 mg | ORAL_TABLET | Freq: Two times a day (BID) | ORAL | 3 refills | Status: DC

## 2022-12-02 NOTE — Patient Instructions (Addendum)
Medication Instructions:  [facility]  Repatha 140 q 2 wks , coupon  Start taking Wegovy  Month 1: 0.25 mg once a week.  Month 2: 0.5 mg once a week. (Call or send [place] a MyChart message when you are on week 2, so we can send your next dose in for you).  Month 3: 1 mg once a week.  Month 4: 1.7 mg once a week.  Month 5 and beyond: 2.4 mg once a week (maintenance dose)     If you need a refill on your cardiac medications before your next appointment, please call your pharmacy.   Lab work: No new labs needed  Testing/Procedures: No new testing needed  Follow-Up: At [facility], you and your health needs are our priority.  As part of our continuing mission to provide you with exceptional heart care, we have created designated Provider Care Teams.  These Care Teams include your primary Cardiologist (physician) and Advanced Practice Providers (APPs -  Physician Assistants and Nurse Practitioners) who all work together to provide you with the care you need, when you need it.  You will need a follow up appointment in 12 months  Providers on your designated Care Team:   Nicolasa Ducking, NP Eula Listen, PA-C Cadence Fransico Michael, [place]  COVID-19 Vaccine Information can be found at: [email or web address] For questions related to vaccine distribution or appointments, please email [email or web address] or call [phone].

## 2022-12-03 ENCOUNTER — Other Ambulatory Visit: Payer: Self-pay

## 2022-12-06 ENCOUNTER — Encounter: Payer: Self-pay | Admitting: Cardiovascular Disease

## 2022-12-08 ENCOUNTER — Other Ambulatory Visit: Payer: Self-pay

## 2022-12-08 MED ORDER — SEMAGLUTIDE-WEIGHT MANAGEMENT 0.25 MG/0.5ML ~~LOC~~ SOAJ: 0.2500 mg | SUBCUTANEOUS | 0 refills | Status: DC

## 2022-12-15 ENCOUNTER — Ambulatory Visit: Payer: BC Managed Care – PPO | Attending: Internal Medicine | Admitting: Physical Therapy

## 2022-12-15 DIAGNOSIS — M6281 Muscle weakness (generalized): Secondary | ICD-10-CM

## 2022-12-15 DIAGNOSIS — M79601 Pain in right arm: Secondary | ICD-10-CM

## 2022-12-15 DIAGNOSIS — M2560 Stiffness of unspecified joint, not elsewhere classified: Secondary | ICD-10-CM

## 2022-12-16 ENCOUNTER — Other Ambulatory Visit: Payer: Self-pay

## 2022-12-21 ENCOUNTER — Telehealth: Payer: BC Managed Care – PPO | Admitting: Nurse Practitioner

## 2022-12-21 DIAGNOSIS — M1000 Idiopathic gout, unspecified site: Secondary | ICD-10-CM

## 2022-12-21 MED ORDER — PREDNISONE 20 MG PO TABS: 40.0000 mg | ORAL_TABLET | Freq: Every day | ORAL | 0 refills | Status: DC

## 2022-12-21 NOTE — Progress Notes (Signed)
I have spent 5 minutes in review of e-visit questionnaire, review and updating patient chart, medical decision making and response to patient.  ° °Jerrell Mangel W Secilia Apps, NP ° °  °

## 2022-12-21 NOTE — Progress Notes (Signed)
E-Visit for Gout Symptoms  We are sorry that you are not feeling well. We are here to help!  Based on what you shared with me it looks like you have a flare of your gout.  Gout is a form of arthritis. It can cause pain and swelling in the joints. At first, it tends to affect only 1 joint - most frequently the big toe. It happens in people who have too much uric acid in the blood. Uric acid is a chemical that is produced when the body breaks down certain foods. Uric acid can form sharp needle-like crystals that build up in the joints and cause pain. Uric acid crystals can also form inside the tubes that carry urine from the kidneys to the bladder. These crystals can turn into "kidney stones" that can cause pain and problems with the flow of urine. People with gout get sudden "flares" or attacks of severe pain, most often the big toe, ankle, or knee. Often the joint also turns red and swells. Usually, only 1 joint is affected, but some people have pain in more than 1 joint. Gout flares tend to happen more often during the night.  The pain from gout can be extreme. The pain and swelling are worst at the beginning of a gout flare. The symptoms then get better within a few days to weeks. It is not clear how the body "turns off" a gout flare.  Do not start any NEW preventative medicine until the gout has cleared completely. However, If you are already on Probenecid or Allopurinol for CHRONIC gout, you may continue taking this during an active flare up  I have prescribed Prednisone 40 mg daily for 7   HOME CARE Losing weight can help relieve gout. It's not clear that following a specific diet plan will help with gout symptoms but eating a balanced diet can help improve your overall health. It can also help you lose weight, if you are overweight. In general, a healthy diet includes plenty of fruits, vegetables, whole grains, and low-fat dairy products (labelled low fat, skim, 2%). Avoid sugar sweetened  drinks (including sodas, tea, juice and juice blends, coffee drinks and sports drinks) Limit alcohol to 1-2 drinks of beer, spirits or wine daily these can make gout flares worse. Some people with gout also have other health problems, such as heart disease, high blood pressure, kidney disease, or obesity. If you have any of these issues, it's important to work with your doctor to manage them. This can help improve your overall health and might also help with your gout.  GET HELP RIGHT AWAY IF: Your symptoms persist after you have completed your treatment plan You develop severe diarrhea You develop abnormal sensations  You develop vomiting,   You develop weakness  You develop abdominal pain  FOLLOW UP WITH YOUR PRIMARY PROVIDER IF: If your symptoms do not improve within 10 days  MAKE SURE YOU  Understand these instructions. Will watch your condition. Will get help right away if you are not doing well or get worse.  Thank you for choosing an e-visit.  Your e-visit answers were reviewed by a board certified advanced clinical practitioner to complete your personal care plan. Depending upon the condition, your plan could have included both over the counter or prescription medications.  Please review your pharmacy choice. Make sure the pharmacy is open so you can pick up prescription now. If there is a problem, you may contact your provider through [employer] and have  the prescription routed to another pharmacy.  Your safety is important to [place]. If you have drug allergies check your prescription carefully.   For the next 24 hours you can use MyChart to ask questions about today's visit, request a non-urgent call back, or ask for a work or school excuse. You will get an email in the next two days asking about your experience. I hope that your e-visit has been valuable and will speed your recovery.

## 2022-12-22 NOTE — Therapy (Signed)
OUTPATIENT PHYSICAL THERAPY TREATMENT/RECERTIFICATION  Patient Name: Amanda Humphrey MRN: [id number] DOB:April 27, 1965, 57 y.o., female Today's Date: 12/15/2022  PCP: Sherlene Shams, MD REFERRING PROVIDER: Sherlene Shams, MD   PT End of Session - 12/22/22 1709     Visit Number 92    Number of Visits 108    Date for PT Re-Evaluation 02/09/23    PT Start Time 0814    PT Stop Time 0902    PT Time Calculation (min) 48 min    Activity Tolerance Patient tolerated treatment well    Behavior During Therapy [facility] for tasks assessed/performed             Past Medical History:  Diagnosis Date   Allergy    Arthritis    Chronic kidney disease    stones   Fibrocystic breast disease 2013   GERD (gastroesophageal reflux disease)    Meniere disease    Migraines    Past Surgical History:  Procedure Laterality Date   ABDOMINAL HYSTERECTOMY  2011   laprascopic supracervical, [place]   APPENDECTOMY  1999   BREAST BIOPSY Right 11/2010    fibrocystic disease, [place]   CESAREAN SECTION     4 total   CHOLECYSTECTOMY  2011   SPINE SURGERY  2021   TONSILLECTOMY AND ADENOIDECTOMY  1975   TUBAL LIGATION     Patient Active Problem List   Diagnosis Date Noted   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   H/O of hemilaminectomy 02/15/2019   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   Fatigue 09/23/2017   History of pneumonia 04/30/2016   Grief reaction 04/30/2016   Vitamin D deficiency 02/22/2015   Hyperlipidemia 01/01/2015   Visit for preventive health examination 12/04/2013   Lower extremity edema 09/02/2013   Essential hypertension 09/02/2013   History of cardiomyopathy 09/02/2013   SVT (supraventricular tachycardia) (HCC) 09/02/2013   Contrast media allergy 03/10/2013   Fibrocystic breast disease    Morbid obesity (HCC) 02/05/2012   Meniere disease     REFERRING DIAG:  M79.18 (ICD-10-CM) - Myalgia, other site  Z98.890 (ICD-10-CM) - Other specified postprocedural  states  M54.12 (ICD-10-CM) - Radiculopathy, cervical region    THERAPY DIAG:  Joint stiffness of spine  Pain in right arm  Muscle weakness (generalized)  PERTINENT HISTORY: See evaluation  PRECAUTIONS: N/A  SUBJECTIVE:   Pt. States she is scheduled for eyelid surgery next week.  No new complaints and remains pain limited with cervical ROM.  No c/o UE radicular symptoms at this time.     PAIN:  Are you having pain? Yes: NPRS scale: 5/10 neck/ R arm ("numbness/tingling in pinky/ring finger").   Pain location: Neck Pain description: aching/ stiff/ tight Aggravating factors: prolonged sitting/ activity Relieving factors: stretches/ PT  12/5: Cervical AROM: L rotn.: 70 deg./ R rotn. 51 deg.  (R cervical tightness/ catching).  L UT feels tight.   Cervical flexion 60 deg./ extension 38 deg.  L lat. Flexion 38 deg./ R lat. Flexion 38 deg.  Seated B shoulder flexion/ abduction 4/5 MMT (increase R UT/ shoulder discomfort).  Grip: L 76.7#/ R 52.1#.  Reassessed R hand/finger manual dexterity.     Today's Treatment: 12/15/2022  Manual Therapy:   Supine STM on bilateral cervical paraspinals, levator scap, upper trap, and suboccipitals; (+) trigger point R/L UT and suboccipitals (use of Hypervolt) Suboccipital release; Supine levator scap stretches: 2 x 30 sec bilaterally; Supine UT stretches with GHJ OP 2 x 30 sec bilaterally;    Trigger Point  Dry Needling (TDN), unbilled Education previously performed with patient regarding potential benefit of TDN. Previously reviewed precautions and risks with patient. Pt provided verbal consent to treatment. In prone using clean technique TDN performed to bilateral upper traps with 4, 0.25 x 40 single needle placements (2 on each side).  Also performed TDN to bilateral cervical multifidi with 5, 0.25 x 40 single needle placements (one on each side at C3) with deep ache reported. Pistoning technique utilized with periods of static hold. No excessive  soreness reported today following intervention;   Application of Biofreeze at end of tx.     PATIENT EDUCATION: Education details: Plan of care Person educated: Patient Education method: Explanation, Demonstration, and Handouts Education comprehension: verbalized understanding and returned demonstration   HOME EXERCISE PROGRAM: [id number].  HEP: [id number]             PT LONG TERM GOAL 1    Title Pt will improve worse cervical pain by at least 3 NPS points to allow increased tolerance during work and home related ADLs.     Baseline NPS: 8/10 current, 7/10 best, 10/10 Worse. 10/20: Worse pain 7/10.  10/12: 5/10 neck pain at worst (improving)- stress related.  4/15: 4/10. 6/13: 5/10    Time 8     Period Weeks     Status Partially Met     Target Date 11/04/2022          PT LONG TERM GOAL 2    Title Pt will improve bilat UE MMT by atleast 1/2 grade to facilitate greater functional abilities with ADLs related to caregiving.     Baseline UE MMT R/L:   Shoulder Flexion: 4/4-  Shoulder abduction: 4/4-  Shoulder ER: 4/4  Shoulder IR: 4/4  Elbow ext: 5/5   Eblow flex: 5/5 10/20: UE MMT R/L:   Shoulder Flexion: 4/4  Shoulder abduction: 4/4 Shoulder ER: 4/4  Shoulder IR: 4/4  Elbow ext: 5/5   Elbow flex: 5/5.  10/12: B shoulder flexion 4/5 MMT, abduction 4/5 MMT biceps/triceps 5/5 MMT.  6/13: B shoulder flexion 4/5 MMT, abduction 4/5 MMT biceps/triceps 5/5 MMT.  IR/ER 4+/5     Time 8     Period Weeks     Status Partially Met     Target Date 11/04/2022           PT LONG TERM GOAL 3    Title Pt will improve functional R hand strength equal to L to promote return of hand useage during grooming and feeding.     Baseline Grip R/L: 76.1 / 33.9    Lateral prehension grip R/L: 14 / 4 10/20: Grip R/L: 62.0 / 41.4    Lateral prehension grip R/L: 13 / 6.  1/12:  R 69#/ L 49.8#.  6/13: see above    Time 8     Period Weeks     Status Partially Met     Target Date 11/04/2022            Plan -      Clinical Impression Statement  Continued treatment focus on manual therapy for range of motion, tissue extensibility, and pain control. Good cervical AROM (all planes).  Pt. Has benefited from consistent trigger point dry needling to manage pain/ exacerbation of pain symptoms with daily/ work-related tasks. Encouraged continuing HEP and follow-up as scheduled.  Pt will continue to benefit from skilled PT to reduce cervical pain with ADL's and work related tasks.       Personal Factors  and Comorbidities Comorbidity 2;Profession;Past/Current Experience    Comorbidities Hypertension, Obesity    Examination-Activity Limitations Squat;Locomotion Level    Examination-Participation Restrictions Occupation;Cleaning;Laundry    Stability/Clinical Decision Making Evolving/Moderate complexity    Clinical Decision Making Moderate    Rehab Potential Good    PT Frequency 1-2x/biweekly   PT Duration 8 weeks    PT Treatment/Interventions ADLs/Self Care Home Management;Aquatic Therapy;Biofeedback;Cryotherapy;Electrical Stimulation;Moist Heat;Ultrasound;Functional mobility training;Therapeutic activities;Therapeutic exercise;Balance training;Neuromuscular re-education;Patient/family education;Manual techniques;Scar mobilization;Passive range of motion;Dry needling;Energy conservation;Taping;Gait training;Stair training;DME Instruction;Spinal Manipulations;Joint Manipulations, Canalith repositioning procedure.    PT Next Visit Plan Trigger point release technique/ Hypervolt.  Attempt supine TDN to UT musculature.     PT Home Exercise Plan [id number].   HEP: [id number]    Consulted and Agree with Plan of Care Patient         Cammie Mcgee, PT, DPT # [phone] Physical Therapist- [facility]  12/22/2022, 11:51 AM

## 2022-12-23 ENCOUNTER — Ambulatory Visit: Payer: BC Managed Care – PPO | Admitting: Physical Therapy

## 2022-12-23 DIAGNOSIS — M2560 Stiffness of unspecified joint, not elsewhere classified: Secondary | ICD-10-CM

## 2022-12-23 DIAGNOSIS — M6281 Muscle weakness (generalized): Secondary | ICD-10-CM

## 2022-12-23 DIAGNOSIS — M79601 Pain in right arm: Secondary | ICD-10-CM

## 2022-12-24 NOTE — Therapy (Addendum)
OUTPATIENT PHYSICAL THERAPY TREATMENT  Patient Name: Amanda Humphrey MRN: [id number] DOB:[birth year], 57 y.o., female Today's Date: 12/23/2022  PCP: Sherlene Shams, MD REFERRING PROVIDER: Sherlene Shams, MD   PT End of Session - 12/24/22 1540     Visit Number 93    Number of Visits 108    Date for PT Re-Evaluation 02/09/23    PT Start Time 1516    PT Stop Time 1603    PT Time Calculation (min) 47 min    Activity Tolerance Patient tolerated treatment well    Behavior During Therapy [facility] for tasks assessed/performed             Past Medical History:  Diagnosis Date   Allergy    Arthritis    Chronic kidney disease    stones   Fibrocystic breast disease 2013   GERD (gastroesophageal reflux disease)    Meniere disease    Migraines    Past Surgical History:  Procedure Laterality Date   ABDOMINAL HYSTERECTOMY  2011   laprascopic supracervical, [place]   APPENDECTOMY  1999   BREAST BIOPSY Right 11/2010    fibrocystic disease, [place]   CESAREAN SECTION     4 total   CHOLECYSTECTOMY  2011   SPINE SURGERY  2021   TONSILLECTOMY AND ADENOIDECTOMY  1975   TUBAL LIGATION     Patient Active Problem List   Diagnosis Date Noted   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   H/O of hemilaminectomy 02/15/2019   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   Fatigue 09/23/2017   History of pneumonia 04/30/2016   Grief reaction 04/30/2016   Vitamin D deficiency 02/22/2015   Hyperlipidemia 01/01/2015   Visit for preventive health examination 12/04/2013   Lower extremity edema 09/02/2013   Essential hypertension 09/02/2013   History of cardiomyopathy 09/02/2013   SVT (supraventricular tachycardia) (HCC) 09/02/2013   Contrast media allergy 03/10/2013   Fibrocystic breast disease    Morbid obesity (HCC) 02/05/2012   Meniere disease     REFERRING DIAG:  M79.18 (ICD-10-CM) - Myalgia, other site  Z98.890 (ICD-10-CM) - Other specified postprocedural states  M54.12  (ICD-10-CM) - Radiculopathy, cervical region    THERAPY DIAG:  Joint stiffness of spine  Pain in right arm  Muscle weakness (generalized)  PERTINENT HISTORY: See evaluation  PRECAUTIONS: N/A  SUBJECTIVE:   Pt. Has not been seen by PT over past couple months due to scheduling issues/ out of town/ eyelid surgery.     PAIN:  Are you having pain? Yes: NPRS scale: 5/10 neck/ R arm ("numbness/tingling in pinky/ring finger").   Pain location: Neck Pain description: aching/ stiff/ tight Aggravating factors: prolonged sitting/ activity Relieving factors: stretches/ PT  12/5: Cervical AROM: L rotn.: 70 deg./ R rotn. 51 deg.  (R cervical tightness/ catching).  L UT feels tight.   Cervical flexion 60 deg./ extension 38 deg.  L lat. Flexion 38 deg./ R lat. Flexion 38 deg.  Seated B shoulder flexion/ abduction 4/5 MMT (increase R UT/ shoulder discomfort).  Grip: L 76.7#/ R 52.1#.  Reassessed R hand/finger manual dexterity.     Today's Treatment: 12/23/2022  Manual Therapy:   Supine STM on bilateral cervical paraspinals, levator scap, upper trap, and suboccipitals; (+) trigger point R/L UT and suboccipitals (use of Hypervolt) Suboccipital release; Supine levator scap stretches: 2 x 30 sec bilaterally; Supine UT stretches with GHJ OP 2 x 30 sec bilaterally;    Trigger Point Dry Needling (TDN), unbilled Education previously performed with patient regarding  potential benefit of TDN. Previously reviewed precautions and risks with patient. Pt provided verbal consent to treatment. In prone using clean technique TDN performed to bilateral upper traps with 4, 0.25 x 40 single needle placements (2 on each side).  Also performed TDN to bilateral cervical multifidi with 5, 0.25 x 40 single needle placements (one on each side at C3) with deep ache reported. Pistoning technique utilized with periods of static hold. No excessive soreness reported today following intervention;       PATIENT  EDUCATION: Education details: Plan of care Person educated: Patient Education method: Explanation, Demonstration, and Handouts Education comprehension: verbalized understanding and returned demonstration   HOME EXERCISE PROGRAM: [id number].  HEP: [id number]             PT LONG TERM GOAL 1    Title Pt will improve worse cervical pain by at least 3 NPS points to allow increased tolerance during work and home related ADLs.     Baseline NPS: 8/10 current, 7/10 best, 10/10 Worse. 10/20: Worse pain 7/10.  10/12: 5/10 neck pain at worst (improving)- stress related.  4/15: 4/10. 6/13: 5/10    Time 8     Period Weeks     Status Partially Met     Target Date 02/09/2023          PT LONG TERM GOAL 2    Title Pt will improve bilat UE MMT by atleast 1/2 grade to facilitate greater functional abilities with ADLs related to caregiving.     Baseline UE MMT R/L:   Shoulder Flexion: 4/4-  Shoulder abduction: 4/4-  Shoulder ER: 4/4  Shoulder IR: 4/4  Elbow ext: 5/5   Eblow flex: 5/5 10/20: UE MMT R/L:   Shoulder Flexion: 4/4  Shoulder abduction: 4/4 Shoulder ER: 4/4  Shoulder IR: 4/4  Elbow ext: 5/5   Elbow flex: 5/5.  10/12: B shoulder flexion 4/5 MMT, abduction 4/5 MMT biceps/triceps 5/5 MMT.  6/13: B shoulder flexion 4/5 MMT, abduction 4/5 MMT biceps/triceps 5/5 MMT.  IR/ER 4+/5     Time 8     Period Weeks     Status Partially Met     Target Date 02/09/2023           PT LONG TERM GOAL 3    Title Pt will improve functional R hand strength equal to L to promote return of hand useage during grooming and feeding.     Baseline Grip R/L: 76.1 / 33.9    Lateral prehension grip R/L: 14 / 4 10/20: Grip R/L: 62.0 / 41.4    Lateral prehension grip R/L: 13 / 6.  1/12:  R 69#/ L 49.8#.  6/13: see above    Time 8     Period Weeks     Status Partially Met     Target Date 02/09/2023           Plan -     Clinical Impression Statement  Continued treatment focus on manual therapy for range of motion, tissue  extensibility, and pain control. Good cervical AROM (all planes).  Pt. Has benefited from consistent trigger point dry needling to manage pain/ exacerbation of pain symptoms with daily/ work-related tasks. Encouraged continuing HEP and follow-up as scheduled.  Pt will continue to benefit from skilled PT to reduce cervical pain with ADL's and work related tasks.       Personal Factors and Comorbidities Comorbidity 2;Profession;Past/Current Experience    Comorbidities Hypertension, Obesity    Examination-Activity Limitations Squat;Locomotion Level  Examination-Participation Restrictions Occupation;Cleaning;Laundry    Stability/Clinical Decision Making Evolving/Moderate complexity    Clinical Decision Making Moderate    Rehab Potential Good    PT Frequency 1-2x/biweekly   PT Duration 8 weeks    PT Treatment/Interventions ADLs/Self Care Home Management;Aquatic Therapy;Biofeedback;Cryotherapy;Electrical Stimulation;Moist Heat;Ultrasound;Functional mobility training;Therapeutic activities;Therapeutic exercise;Balance training;Neuromuscular re-education;Patient/family education;Manual techniques;Scar mobilization;Passive range of motion;Dry needling;Energy conservation;Taping;Gait training;Stair training;DME Instruction;Spinal Manipulations;Joint Manipulations, Canalith repositioning procedure.    PT Next Visit Plan Trigger point release technique/ Hypervolt.  Attempt supine TDN to UT musculature.     PT Home Exercise Plan [id number].   HEP: [id number]    Consulted and Agree with Plan of Care Patient         Cammie Mcgee, PT, DPT # [phone] Physical Therapist- [facility]  12/24/2022, 11:51 AM

## 2022-12-28 ENCOUNTER — Ambulatory Visit: Payer: BC Managed Care – PPO

## 2022-12-28 ENCOUNTER — Ambulatory Visit
Admission: EM | Admit: 2022-12-28 | Discharge: 2022-12-28 | Disposition: A | Discharge status: Home / Self Care | Payer: BC Managed Care – PPO | Attending: Emergency Medicine | Admitting: Emergency Medicine

## 2022-12-28 DIAGNOSIS — K449 Diaphragmatic hernia without obstruction or gangrene: Secondary | ICD-10-CM | POA: Diagnosis not present

## 2022-12-28 DIAGNOSIS — H66002 Acute suppurative otitis media without spontaneous rupture of ear drum, left ear: Secondary | ICD-10-CM | POA: Diagnosis not present

## 2022-12-28 DIAGNOSIS — J069 Acute upper respiratory infection, unspecified: Secondary | ICD-10-CM | POA: Diagnosis not present

## 2022-12-28 DIAGNOSIS — I700 Atherosclerosis of aorta: Secondary | ICD-10-CM | POA: Diagnosis not present

## 2022-12-28 DIAGNOSIS — R059 Cough, unspecified: Secondary | ICD-10-CM | POA: Diagnosis not present

## 2022-12-28 MED ORDER — CEFDINIR 300 MG PO CAPS: 300.0000 mg | ORAL_CAPSULE | Freq: Two times a day (BID) | ORAL | 0 refills | Status: AC

## 2022-12-28 MED ORDER — BENZONATATE 100 MG PO CAPS
200.0000 mg | ORAL_CAPSULE | Freq: Three times a day (TID) | ORAL | 0 refills | Status: DC
Start: 2022-12-28 — End: 2023-01-31

## 2022-12-28 MED ORDER — AMOXICILLIN-POT CLAVULANATE 875-125 MG PO TABS: 1.0000 | ORAL_TABLET | Freq: Two times a day (BID) | ORAL | 0 refills | Status: DC

## 2022-12-28 MED ORDER — PROMETHAZINE-DM 6.25-15 MG/5ML PO SYRP: 5.0000 mL | ORAL_SOLUTION | Freq: Four times a day (QID) | ORAL | 0 refills | Status: DC | PRN

## 2022-12-28 MED ORDER — IPRATROPIUM BROMIDE 0.06 % NA SOLN: 2.0000 | Freq: Four times a day (QID) | NASAL | 12 refills | Status: AC

## 2022-12-28 NOTE — ED Triage Notes (Signed)
Pt c/o cough,chest congestion x1 day & L ear pain x4 days. Just finished prednisone yesterday d/t gout flare up.

## 2022-12-28 NOTE — Discharge Instructions (Addendum)
Take the cefdinir twice daily for 10 days with food for treatment of your ear infection.  Take an over-the-counter probiotic 1 hour after each dose of antibiotic to prevent diarrhea.  Use over-the-counter Tylenol and ibuprofen as needed for pain or fever.  Place a hot water bottle, or heating pad, underneath your pillowcase at night to help dilate up your ear and aid in pain relief as well as resolution of the infection.  Use the Atrovent nasal spray, 2 squirts in each nostril every 6 hours, as needed for runny nose and postnasal drip.  Use the Tessalon Perles every 8 hours during the day.  Take them with a small sip of water.  They may give you some numbness to the base of your tongue or a metallic taste in your mouth, this is normal.  Use the Promethazine DM cough syrup at bedtime for cough and congestion.  It will make you drowsy so do not take it during the day.  Return for reevaluation or see your primary care provider for any new or worsening symptoms.

## 2022-12-28 NOTE — ED Provider Notes (Addendum)
[facility]    CSN: [id number] Arrival date & time: 12/28/22  7829      History   Chief Complaint Chief Complaint  Patient presents with   Cough   Chest Congestion    HPI Amanda Humphrey is a 57 y.o. female.   HPI  57 year old female with a past medical history significant for cardiomyopathy, SVT, hyperlipidemia, gout, obesity, Mnire's disease, and fibrocystic breast disease presents for evaluation of cough and chest congestion for the last day and left ear pain for 4 days.  She also Dors is runny nose and nasal congestion, scratchy throat, postnasal drip, shortness breath, and wheezing.  No fevers.  She reports that she just finished taking a course of prednisone for a gout flare yesterday.  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   H/O of hemilaminectomy 02/15/2019   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   Fatigue 09/23/2017   History of pneumonia 04/30/2016   Grief reaction 04/30/2016   Vitamin D deficiency 02/22/2015   Hyperlipidemia 01/01/2015   Visit for preventive health examination 12/04/2013   Lower extremity edema 09/02/2013   Essential hypertension 09/02/2013   History of cardiomyopathy 09/02/2013   SVT (supraventricular tachycardia) (HCC) 09/02/2013   Contrast media allergy 03/10/2013   Fibrocystic breast disease    Morbid obesity (HCC) 02/05/2012   Meniere disease     Past Surgical History:  Procedure Laterality Date   ABDOMINAL HYSTERECTOMY  2011   laprascopic supracervical, [place]   APPENDECTOMY  1999   BREAST BIOPSY Right 11/2010    fibrocystic disease, [place]   CESAREAN SECTION     4 total   CHOLECYSTECTOMY  2011   SPINE SURGERY  2021    TONSILLECTOMY AND ADENOIDECTOMY  1975   TUBAL LIGATION      OB History   No obstetric history on file.      Home Medications    Prior to Admission medications   Medication Sig Start Date End Date Taking? Authorizing Provider  amphetamine-dextroamphetamine (ADDERALL) 20 MG tablet Take 20 mg by mouth every evening.  09/22/18  Yes [provider]  benzonatate (TESSALON) 100 MG capsule Take 2 capsules (200 mg total) by mouth every 8 (eight) hours. 12/28/22  Yes Becky Augusta, NP  carvedilol (COREG) 6.25 MG tablet Take 1 tablet (6.25 mg total) by mouth 2 (two) times daily with a meal. TAKE 1 TABLET TWICE A DAY 12/02/22  Yes Gollan, Tollie Pizza, MD  cefdinir (OMNICEF) 300 MG capsule Take 1 capsule (300 mg total) by mouth 2 (two) times daily for 10 days. 12/28/22 01/07/23 Yes Becky Augusta, NP  cyclobenzaprine (FLEXERIL) 10 MG tablet Take 1 tablet (10 mg total) by mouth at bedtime. 03/27/22  Yes Sherlene Shams, MD  Evolocumab (REPATHA SURECLICK) 140 MG/ML SOAJ Inject 140 mg into the skin every 14 (fourteen) days. 12/02/22  Yes Antonieta Iba, MD  ezetimibe (ZETIA) 10 MG tablet Take 1 tablet (10 mg total) by mouth daily. 12/02/22  Yes Gollan, Tollie Pizza, MD  furosemide (LASIX) 20 MG tablet Take 1 tablet (20 mg total) by mouth daily as needed. 12/02/22  Yes Antonieta Iba, MD  hydrochlorothiazide (HYDRODIURIL)  25 MG tablet Take 1 tablet (25 mg total) by mouth daily. 12/02/22  Yes Antonieta Iba, MD  ipratropium (ATROVENT) 0.06 % nasal spray Place 2 sprays into both nostrils 4 (four) times daily. 12/28/22  Yes Becky Augusta, NP  methocarbamol (ROBAXIN) 500 MG tablet Take 1 tablet (500 mg total) by mouth 2 (two) times daily. 03/27/22  Yes Sherlene Shams, MD  omeprazole (PRILOSEC) 40 MG capsule TAKE 1 CAPSULE DAILY 10/14/22  Yes Sherlene Shams, MD  promethazine-dextromethorphan (PROMETHAZINE-DM) 6.25-15 MG/5ML syrup Take 5 mLs by mouth 4 (four) times daily as needed. 12/28/22  Yes Becky Augusta, NP   Semaglutide-Weight Management 0.25 MG/0.5ML SOAJ Inject 0.25 mg into the skin once a week for 28 days. 12/08/22 01/05/23 Yes Gollan, Tollie Pizza, MD  Semaglutide-Weight Management 0.5 MG/0.5ML SOAJ Inject 0.5 mg into the skin once a week for 28 days. 12/31/22 01/28/23 Yes Gollan, Tollie Pizza, MD    Family History Family History  Problem Relation Age of Onset   Hypertension Mother    Cancer Mother        ovarian   Arthritis Mother    ADD / ADHD Mother    Hyperlipidemia Father    Hypertension Father    Diabetes Father    Stroke Father    Alzheimer's disease Maternal Grandmother    Heart disease Maternal Grandmother    Breast cancer Maternal Grandmother    Alzheimer's disease Paternal Grandmother    Breast cancer Paternal Grandmother     Social History Social History   Tobacco Use   Smoking status: Never   Smokeless tobacco: Never  Vaping Use   Vaping status: Never Used  Substance Use Topics   Alcohol use: Yes    Alcohol/week: 1.0 standard drink of alcohol    Types: 1 Glasses of wine per week    Comment: social   Drug use: No     Allergies   Bee venom, Codeine, Hydrocodone, Tramadol, and Oxycodone   Review of Systems Review of Systems  Constitutional:  Negative for fever.  HENT:  Positive for congestion, ear pain, postnasal drip, rhinorrhea and sore throat.   Respiratory:  Positive for cough, shortness of breath and wheezing.      Physical Exam Triage Vital Signs ED Triage Vitals  Encounter Vitals Group     BP --      Systolic BP Percentile --      Diastolic BP Percentile --      Pulse --      Resp 12/28/22 0823 16     Temp --      Temp Source 12/28/22 0823 Oral     SpO2 --      Weight 12/28/22 0822 246 lb (111.6 kg)     Height 12/28/22 0822 5' 7.5" (1.715 m)     Head Circumference --      Peak Flow --      Pain Score 12/28/22 0825 6     Pain Loc --      Pain Education --      Exclude from Growth Chart --    No data found.  Updated Vital Signs BP (!)  152/81 (BP Location: Left Arm)   Pulse 70   Temp 98.3 F (36.8 C) (Oral)   Resp 16   Ht 5' 7.5" (1.715 m)   Wt 246 lb (111.6 kg)   SpO2 98%   BMI 37.96 kg/m   Visual Acuity Right Eye Distance:   Left Eye Distance:  Bilateral Distance:    Right Eye Near:   Left Eye Near:    Bilateral Near:     Physical Exam Vitals and nursing note reviewed.  Constitutional:      Appearance: Normal appearance. She is not ill-appearing.  HENT:     Head: Normocephalic and atraumatic.     Right Ear: Ear canal and external ear normal. There is no impacted cerumen.     Left Ear: Ear canal and external ear normal. There is no impacted cerumen.     Ears:     Comments: Both tympanic membranes are erythematous but the left is a deeper red than the right.  There is also a serous effusion present behind the left tympanic membrane.  Both EACs are clear.    Nose: Congestion and rhinorrhea present.     Comments: Nasal mucosa is erythematous and edematous with clear discharge in both nares.    Mouth/Throat:     Mouth: Mucous membranes are moist.     Pharynx: Oropharynx is clear. Posterior oropharyngeal erythema present. No oropharyngeal exudate.     Comments: Tonsillar pillars are benign.  Posterior oropharynx demonstrates erythema and clear postnasal drip. Cardiovascular:     Rate and Rhythm: Normal rate and regular rhythm.     Pulses: Normal pulses.     Heart sounds: Normal heart sounds. No murmur heard.    No friction rub. No gallop.  Pulmonary:     Effort: Pulmonary effort is normal.     Breath sounds: Normal breath sounds. No wheezing, rhonchi or rales.  Musculoskeletal:     Cervical back: Normal range of motion and neck supple. No tenderness.  Lymphadenopathy:     Cervical: No cervical adenopathy.  Skin:    General: Skin is warm and dry.     Capillary Refill: Capillary refill takes less than 2 seconds.     Findings: No rash.  Neurological:     General: No focal deficit present.     Mental  Status: She is alert and oriented to person, place, and time.      UC Treatments / Results  Labs (all labs ordered are listed, but only abnormal results are displayed) Labs Reviewed - No data to display  EKG   Radiology DG Chest 2 View  Result Date: 12/28/2022 CLINICAL DATA:  Cough EXAM: CHEST - 2 VIEW COMPARISON:  03/27/2022 FINDINGS: The heart size and mediastinal contours are within normal limits. Aortic atherosclerosis. Moderate hiatal hernia. Both lungs are clear. Thoracic dextrocurvature. IMPRESSION: 1. No active cardiopulmonary disease. 2. Moderate hiatal hernia. Electronically Signed   By: Duanne Guess D.O.   On: 12/28/2022 09:34    Procedures Procedures (including critical care time)  Medications Ordered in UC Medications - No data to display  Initial Impression / Assessment and Plan / UC Course  I have reviewed the triage vital signs and the nursing notes.  Pertinent labs & imaging results that were available during my care of the patient were reviewed by me and considered in my medical decision making (see chart for details).   Patient is a pleasant 57 year old female presenting for evaluation of 1 day worth of cough and 4 days worth of associated URI symptoms as outlined HPI above.  Her physical exam does reveal erythema to both tympanic membranes with left TM being a deeper red and a serous effusion being present.  She does have nasal mucosal edema and clear rhinorrhea as well as clear postnasal drip.  Cardiopulmonary exam reveals clear lung sounds  in all fields.  She does demonstrate a harsh cough in the exam room.  I will obtain a chest x-ray to rule out any acute cardiopulmonary pathology, especially given that she was recently on a course of 40 mg of prednisone a day for a gout flare which has immunosuppressive properties.  Chest x-ray independently reviewed and evaluated by me.  Impression: There is blunting of both costophrenic angles though no definitive  effusion or infiltrate noted.  Cardiomediastinal silhouette is at the upper limits of normal.  Radiology overread is pending. Radiology impression states no active cardiopulmonary disease.  I will discharge patient home with a diagnosis of URI with cough and congestion as well as left otitis media.  I will start her on Augmentin 875 twice daily for 10 days for treatment of her otitis media and URI.  Also Atrovent nasal spray over the nasal congestion, Tessalon Perles, and Promethazine DM cough syrup for cough and congestion.  Tylenol and ibuprofen as needed for pain.  Return precautions reviewed.  Work note provided.  At discharge patient reports that Augmentin gives her GI problems and she would prefer a different antibiotic so I will discharge her home on cefdinir 300 mg twice daily for 10 days.   Final Clinical Impressions(s) / UC Diagnoses   Final diagnoses:  URI with cough and congestion  Non-recurrent acute suppurative otitis media of left ear without spontaneous rupture of tympanic membrane     Discharge Instructions      Take the cefdinir twice daily for 10 days with food for treatment of your ear infection.  Take an over-the-counter probiotic 1 hour after each dose of antibiotic to prevent diarrhea.  Use over-the-counter Tylenol and ibuprofen as needed for pain or fever.  Place a hot water bottle, or heating pad, underneath your pillowcase at night to help dilate up your ear and aid in pain relief as well as resolution of the infection.  Use the Atrovent nasal spray, 2 squirts in each nostril every 6 hours, as needed for runny nose and postnasal drip.  Use the Tessalon Perles every 8 hours during the day.  Take them with a small sip of water.  They may give you some numbness to the base of your tongue or a metallic taste in your mouth, this is normal.  Use the Promethazine DM cough syrup at bedtime for cough and congestion.  It will make you drowsy so do not take it during the  day.  Return for reevaluation or see your primary care provider for any new or worsening symptoms.       ED Prescriptions     Medication Sig Dispense Auth. Provider   amoxicillin-clavulanate (AUGMENTIN) 875-125 MG tablet  (Status: Discontinued) Take 1 tablet by mouth every 12 (twelve) hours for 10 days. 20 tablet Becky Augusta, NP   benzonatate (TESSALON) 100 MG capsule Take 2 capsules (200 mg total) by mouth every 8 (eight) hours. 21 capsule Becky Augusta, NP   ipratropium (ATROVENT) 0.06 % nasal spray Place 2 sprays into both nostrils 4 (four) times daily. 15 mL Becky Augusta, NP   promethazine-dextromethorphan (PROMETHAZINE-DM) 6.25-15 MG/5ML syrup Take 5 mLs by mouth 4 (four) times daily as needed. 118 mL Becky Augusta, NP   cefdinir (OMNICEF) 300 MG capsule Take 1 capsule (300 mg total) by mouth 2 (two) times daily for 10 days. 20 capsule Becky Augusta, NP      PDMP not reviewed this encounter.   Becky Augusta, NP 12/28/22 [phone]  Becky Augusta, NP 12/28/22 [phone]

## 2022-12-30 ENCOUNTER — Telehealth: Payer: BC Managed Care – PPO | Admitting: Nurse Practitioner

## 2022-12-30 ENCOUNTER — Ambulatory Visit: Payer: BC Managed Care – PPO | Admitting: Physical Therapy

## 2022-12-30 DIAGNOSIS — J40 Bronchitis, not specified as acute or chronic: Secondary | ICD-10-CM | POA: Diagnosis not present

## 2022-12-30 MED ORDER — DOXYCYCLINE HYCLATE 100 MG PO TABS: 100.0000 mg | ORAL_TABLET | Freq: Two times a day (BID) | ORAL | 0 refills | Status: AC

## 2022-12-30 MED ORDER — ALBUTEROL SULFATE HFA 108 (90 BASE) MCG/ACT IN AERS: 2.0000 | INHALATION_SPRAY | Freq: Four times a day (QID) | RESPIRATORY_TRACT | 0 refills | Status: DC | PRN

## 2022-12-30 MED ORDER — PREDNISONE 20 MG PO TABS: 20.0000 mg | ORAL_TABLET | Freq: Two times a day (BID) | ORAL | 0 refills | Status: AC

## 2022-12-30 NOTE — Progress Notes (Signed)
Virtual Visit Consent   Amanda Humphrey, you are scheduled for a virtual visit with a [facility] provider today. Just as with appointments in the office, your consent must be obtained to participate. Your consent will be active for this visit and any virtual visit you may have with one of our providers in the next 365 days. If you have a MyChart account, a copy of this consent can be sent to you electronically.  As this is a virtual visit, video technology does not allow for your provider to perform a traditional examination. This may limit your provider's ability to fully assess your condition. If your provider identifies any concerns that need to be evaluated in person or the need to arrange testing (such as labs, EKG, etc.), we will make arrangements to do so. Although advances in technology are sophisticated, we cannot ensure that it will always work on either your end or our end. If the connection with a video visit is poor, the visit may have to be switched to a telephone visit. With either a video or telephone visit, we are not always able to ensure that we have a secure connection.  By engaging in this virtual visit, you consent to the provision of healthcare and authorize for your insurance to be billed (if applicable) for the services provided during this visit. Depending on your insurance coverage, you may receive a charge related to this service.  I need to obtain your verbal consent now. Are you willing to proceed with your visit today? Amanda Humphrey has provided verbal consent on 12/30/2022 for a virtual visit (video or telephone). Viviano Simas, FNP  Date: 12/30/2022 1:33 PM  Virtual Visit via Video Note   I, Viviano Simas, connected with  Amanda Humphrey  ([id number], February 22, 1965) on 12/30/22 at  1:45 PM EST by a video-enabled telemedicine application and verified that I am speaking with the correct person using two identifiers.  Location: Patient: Virtual Visit Location Patient:  Home Provider: Virtual Visit Location Provider: Home Office   I discussed the limitations of evaluation and management by telemedicine and the availability of in person appointments. The patient expressed understanding and agreed to proceed.    History of Present Illness: Amanda Humphrey is a 57 y.o. who identifies as a female who was assigned female at birth, and is being seen today for chest congestion.   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   H/O of hemilaminectomy 02/15/2019   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   Fatigue 09/23/2017   History of pneumonia 04/30/2016   Grief reaction 04/30/2016   Vitamin D deficiency 02/22/2015   Hyperlipidemia 01/01/2015   Visit for preventive health examination 12/04/2013   Lower extremity edema 09/02/2013   Essential hypertension 09/02/2013   History of cardiomyopathy 09/02/2013   SVT (supraventricular tachycardia) (HCC) 09/02/2013   Contrast media allergy 03/10/2013   Fibrocystic breast disease    Morbid obesity (HCC) 02/05/2012   Meniere disease  Allergies:  Allergies  Allergen Reactions   Bee Venom Anaphylaxis   Codeine     Rash, Throat swelling,    Hydrocodone Anaphylaxis   Tramadol     Itching, facial swelling   Oxycodone Rash   Medications:  Current Outpatient Medications:    amphetamine-dextroamphetamine (ADDERALL) 20 MG tablet, Take 20 mg by mouth every evening. , Disp: , Rfl:    benzonatate (TESSALON) 100 MG capsule, Take 2 capsules (200 mg total) by mouth every  8 (eight) hours., Disp: 21 capsule, Rfl: 0   carvedilol (COREG) 6.25 MG tablet, Take 1 tablet (6.25 mg total) by mouth 2 (two) times daily with a meal. TAKE 1 TABLET TWICE A DAY, Disp: 180 tablet, Rfl: 3   cefdinir (OMNICEF) 300 MG capsule, Take 1 capsule (300 mg total) by mouth 2 (two) times daily for 10 days., Disp: 20 capsule, Rfl: 0   cyclobenzaprine (FLEXERIL) 10 MG tablet, Take 1 tablet (10 mg total) by mouth at bedtime., Disp: 90 tablet, Rfl: 1   Evolocumab (REPATHA SURECLICK) 140 MG/ML SOAJ, Inject 140 mg into the skin every 14 (fourteen) days., Disp: 6 mL, Rfl: 3   ezetimibe (ZETIA) 10 MG tablet, Take 1 tablet (10 mg total) by mouth daily., Disp: 90 tablet, Rfl: 3   furosemide (LASIX) 20 MG tablet, Take 1 tablet (20 mg total) by mouth daily as needed., Disp: 90 tablet, Rfl: 3   hydrochlorothiazide (HYDRODIURIL) 25 MG tablet, Take 1 tablet (25 mg total) by mouth daily., Disp: 90 tablet, Rfl: 3   ipratropium (ATROVENT) 0.06 % nasal spray, Place 2 sprays into both nostrils 4 (four) times daily., Disp: 15 mL, Rfl: 12   methocarbamol (ROBAXIN) 500 MG tablet, Take 1 tablet (500 mg total) by mouth 2 (two) times daily., Disp: 270 tablet, Rfl: 3   omeprazole (PRILOSEC) 40 MG capsule, TAKE 1 CAPSULE DAILY, Disp: 90 capsule, Rfl: 3   promethazine-dextromethorphan (PROMETHAZINE-DM) 6.25-15 MG/5ML syrup, Take 5 mLs by mouth 4 (four) times daily as needed., Disp: 118 mL, Rfl: 0   Semaglutide-Weight Management 0.25 MG/0.5ML SOAJ, Inject 0.25 mg into the skin once a week for 28 days., Disp: 2 mL, Rfl: 0   [START ON 12/31/2022] Semaglutide-Weight Management 0.5 MG/0.5ML SOAJ, Inject 0.5 mg into the skin once a week for 28 days., Disp: 2 mL, Rfl: 0  Observations/Objective: Patient is well-developed, well-nourished in no acute distress.  Resting comfortably  at home.  Head is normocephalic, atraumatic.  No labored breathing.  Speech is clear and coherent with logical content.  Patient is alert and oriented  at baseline.    Assessment and Plan:  1. Bronchitis  - predniSONE (DELTASONE) 20 MG tablet; Take 1 tablet (20 mg total) by mouth 2 (two) times daily with a meal for 5 days.  Dispense: 10 tablet; Refill: 0  - albuterol (VENTOLIN HFA) 108 (90 Base) MCG/ACT inhaler; Inhale 2 puffs into the lungs every 6 (six) hours as needed for wheezing or shortness of breath.  Dispense: 17 g; Refill: 0  - doxycycline (VIBRA-TABS) 100 MG tablet; Take 1 tablet (100 mg total) by mouth 2 (two) times daily for 7 days.  Dispense: 14 tablet; Refill: 0    Strict follow up precautions will need in person if no improvement with this regimen or with worsening symptoms   Follow Up Instructions: I discussed the assessment and treatment plan with the patient. The patient was provided an opportunity to ask questions and all were answered. The patient agreed with the plan and  demonstrated an understanding of the instructions.  A copy of instructions were sent to the patient via MyChart unless otherwise noted below.    The patient was advised to call back or seek an in-person evaluation if the symptoms worsen or if the condition fails to improve as anticipated.    Viviano Simas, FNP

## 2022-12-31 ENCOUNTER — Other Ambulatory Visit: Payer: Self-pay | Admitting: Emergency Medicine

## 2022-12-31 MED ORDER — SEMAGLUTIDE-WEIGHT MANAGEMENT 0.25 MG/0.5ML ~~LOC~~ SOAJ: 0.2500 mg | SUBCUTANEOUS | Status: AC

## 2023-01-06 ENCOUNTER — Ambulatory Visit: Payer: BC Managed Care – PPO | Admitting: Physical Therapy

## 2023-01-09 ENCOUNTER — Ambulatory Visit: Payer: BC Managed Care – PPO | Attending: Internal Medicine | Admitting: Physical Therapy

## 2023-01-09 ENCOUNTER — Encounter: Payer: Self-pay | Admitting: Physical Therapy

## 2023-01-09 DIAGNOSIS — M6281 Muscle weakness (generalized): Secondary | ICD-10-CM | POA: Diagnosis not present

## 2023-01-09 DIAGNOSIS — M2560 Stiffness of unspecified joint, not elsewhere classified: Secondary | ICD-10-CM | POA: Diagnosis not present

## 2023-01-09 DIAGNOSIS — M79601 Pain in right arm: Secondary | ICD-10-CM | POA: Insufficient documentation

## 2023-01-09 NOTE — Therapy (Signed)
OUTPATIENT PHYSICAL THERAPY TREATMENT  Patient Name: Amanda Humphrey MRN: [id number] DOB:1965/02/28, 57 y.o., female Today's Date: 01/09/2023   PCP: Sherlene Shams, MD REFERRING PROVIDER: Sherlene Shams, MD   PT End of Session - 01/09/23 0715     Visit Number 94    Number of Visits 108    Date for PT Re-Evaluation 02/09/23    PT Start Time 0731    PT Stop Time 0822    PT Time Calculation (min) 51 min    Activity Tolerance Patient tolerated treatment well    Behavior During Therapy [facility] for tasks assessed/performed             Past Medical History:  Diagnosis Date   Allergy    Arthritis    Chronic kidney disease    stones   Fibrocystic breast disease 2013   GERD (gastroesophageal reflux disease)    Meniere disease    Migraines    Past Surgical History:  Procedure Laterality Date   ABDOMINAL HYSTERECTOMY  2011   laprascopic supracervical, [place]   APPENDECTOMY  1999   BREAST BIOPSY Right 11/2010    fibrocystic disease, [place]   CESAREAN SECTION     4 total   CHOLECYSTECTOMY  2011   SPINE SURGERY  2021   TONSILLECTOMY AND ADENOIDECTOMY  1975   TUBAL LIGATION     Patient Active Problem List   Diagnosis Date Noted   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   H/O of hemilaminectomy 02/15/2019   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   Fatigue 09/23/2017   History of pneumonia 04/30/2016   Grief reaction 04/30/2016   Vitamin D deficiency 02/22/2015   Hyperlipidemia 01/01/2015   Visit for preventive health examination 12/04/2013   Lower extremity edema 09/02/2013   Essential hypertension 09/02/2013   History of cardiomyopathy 09/02/2013   SVT (supraventricular tachycardia) (HCC) 09/02/2013   Contrast media allergy 03/10/2013   Fibrocystic breast disease    Morbid obesity (HCC) 02/05/2012   Meniere disease    REFERRING DIAG:  M79.18 (ICD-10-CM) - Myalgia, other site  Z98.890 (ICD-10-CM) - Other specified postprocedural states  M54.12  (ICD-10-CM) - Radiculopathy, cervical region   THERAPY DIAG:  Joint stiffness of spine  Pain in right arm  Muscle weakness (generalized)  PERTINENT HISTORY: See evaluation  PRECAUTIONS: N/A  12/5: Cervical AROM: L rotn.: 70 deg./ R rotn. 51 deg.  (R cervical tightness/ catching).  L UT feels tight.   Cervical flexion 60 deg./ extension 38 deg.  L lat. Flexion 38 deg./ R lat. Flexion 38 deg.  Seated B shoulder flexion/ abduction 4/5 MMT (increase R UT/ shoulder discomfort).  Grip: L 76.7#/ R 52.1#.  Reassessed R hand/finger manual dexterity.    SUBJECTIVE:  01/09/2023  Pt. Was unable to go on out of town trip last week due to pneumonia/ illness.  Pt. Is doing better but not 100% and stopped taking Prednisone on Monday.     PAIN:  Are you having pain? Yes: NPRS scale: 5/10 neck/ R arm ("numbness/tingling in pinky/ring finger").   Pain location: Neck Pain description: aching/ stiff/ tight Aggravating factors: prolonged sitting/ activity Relieving factors: stretches/ PT  Today's Treatment: 01/09/2023   Manual Therapy:    Seated cervical AROM reassessment.  Seated B shoulder flexion/ scaption/ abduction AROM to tolerable end-range with OP (good stretch)- no pain.   Supine STM/ stretches on bilateral cervical paraspinals, levator scap, upper trap, and suboccipitals;  Light cervical traction in supine 2x with holds.  No upper  cervical trigger points noted.    Prone STM with use of Hypervolt to B UT/neck musculature.    Trigger Point Dry Needling (TDN), unbilled Education previously performed with patient regarding potential benefit of TDN. Previously reviewed precautions and risks with patient. Pt provided verbal consent to treatment. In prone using clean technique TDN performed to bilateral upper traps with 4, 0.25 x 40 single needle placements (2 on each side).   Pistoning technique utilized with periods of static hold. No excessive soreness reported today following intervention;    Application of Biofreeze at end of tx.     PATIENT EDUCATION: Education details: Plan of care Person educated: Patient Education method: Explanation, Demonstration, and Handouts Education comprehension: verbalized understanding and returned demonstration   HOME EXERCISE PROGRAM: [id number].  HEP: [id number]             PT LONG TERM GOAL 1    Title Pt will improve worse cervical pain by at least 3 NPS points to allow increased tolerance during work and home related ADLs.     Baseline NPS: 8/10 current, 7/10 best, 10/10 Worse. 10/20: Worse pain 7/10.  10/12: 5/10 neck pain at worst (improving)- stress related.  4/15: 4/10. 6/13: 5/10    Time 8     Period Weeks     Status Partially Met     Target Date 02/09/2023          PT LONG TERM GOAL 2    Title Pt will improve bilat UE MMT by atleast 1/2 grade to facilitate greater functional abilities with ADLs related to caregiving.     Baseline UE MMT R/L:   Shoulder Flexion: 4/4-  Shoulder abduction: 4/4-  Shoulder ER: 4/4  Shoulder IR: 4/4  Elbow ext: 5/5   Eblow flex: 5/5 10/20: UE MMT R/L:   Shoulder Flexion: 4/4  Shoulder abduction: 4/4 Shoulder ER: 4/4  Shoulder IR: 4/4  Elbow ext: 5/5   Elbow flex: 5/5.  10/12: B shoulder flexion 4/5 MMT, abduction 4/5 MMT biceps/triceps 5/5 MMT.  6/13: B shoulder flexion 4/5 MMT, abduction 4/5 MMT biceps/triceps 5/5 MMT.  IR/ER 4+/5     Time 8     Period Weeks     Status Partially Met     Target Date 02/09/2023          PT LONG TERM GOAL 3    Title Pt will improve functional R hand strength equal to L to promote return of hand useage during grooming and feeding.     Baseline Grip R/L: 76.1 / 33.9    Lateral prehension grip R/L: 14 / 4 10/20: Grip R/L: 62.0 / 41.4    Lateral prehension grip R/L: 13 / 6.  1/12:  R 69#/ L 49.8#.  6/13: see above    Time 8     Period Weeks     Status Partially Met     Target Date 02/09/2023           Plan -     Clinical Impression Statement Continued treatment  focus on manual therapy for range of motion, tissue extensibility, and pain control. Good cervical AROM (all planes).  Pt. Has benefited from consistent trigger point dry needling to manage pain/ exacerbation of pain symptoms with daily/ work-related tasks. Encouraged continuing HEP and follow-up as scheduled.  Pt will continue to benefit from skilled PT to reduce cervical pain with ADL's and work related tasks.       Personal Factors and Comorbidities Comorbidity 2;Profession;Past/Current Experience  Comorbidities Hypertension, Obesity    Examination-Activity Limitations Squat;Locomotion Level    Examination-Participation Restrictions Occupation;Cleaning;Laundry    Stability/Clinical Decision Making Evolving/Moderate complexity    Clinical Decision Making Moderate    Rehab Potential Good    PT Frequency 1-2x/biweekly   PT Duration 8 weeks    PT Treatment/Interventions ADLs/Self Care Home Management;Aquatic Therapy;Biofeedback;Cryotherapy;Electrical Stimulation;Moist Heat;Ultrasound;Functional mobility training;Therapeutic activities;Therapeutic exercise;Balance training;Neuromuscular re-education;Patient/family education;Manual techniques;Scar mobilization;Passive range of motion;Dry needling;Energy conservation;Taping;Gait training;Stair training;DME Instruction;Spinal Manipulations;Joint Manipulations, Canalith repositioning procedure.    PT Next Visit Plan Trigger point release technique/ Hypervolt.  Attempt supine TDN to UT musculature.     PT Home Exercise Plan [id number].   HEP: [id number]    Consulted and Agree with Plan of Care Patient         Cammie Mcgee, PT, DPT # [phone] Physical Therapist- [facility]  01/09/2023, 11:51 AM

## 2023-01-14 ENCOUNTER — Ambulatory Visit: Payer: BC Managed Care – PPO | Admitting: Physical Therapy

## 2023-01-14 ENCOUNTER — Encounter: Payer: Self-pay | Admitting: Physical Therapy

## 2023-01-14 DIAGNOSIS — M2560 Stiffness of unspecified joint, not elsewhere classified: Secondary | ICD-10-CM

## 2023-01-14 DIAGNOSIS — M79601 Pain in right arm: Secondary | ICD-10-CM

## 2023-01-14 DIAGNOSIS — M6281 Muscle weakness (generalized): Secondary | ICD-10-CM

## 2023-01-14 NOTE — Therapy (Signed)
OUTPATIENT PHYSICAL THERAPY TREATMENT  Patient Name: Amanda Humphrey MRN: [id number] DOB:[birth year], 57 y.o., female Today's Date: 01/14/2023   PCP: Sherlene Shams, MD REFERRING PROVIDER: Sherlene Shams, MD   PT End of Session - 01/14/23 1018     Visit Number 95    Number of Visits 108    Date for PT Re-Evaluation 02/09/23    PT Start Time 0726    PT Stop Time 0818    PT Time Calculation (min) 52 min    Activity Tolerance Patient tolerated treatment well    Behavior During Therapy [facility] for tasks assessed/performed             Past Medical History:  Diagnosis Date   Allergy    Arthritis    Chronic kidney disease    stones   Fibrocystic breast disease 2013   GERD (gastroesophageal reflux disease)    Meniere disease    Migraines    Past Surgical History:  Procedure Laterality Date   ABDOMINAL HYSTERECTOMY  2011   laprascopic supracervical, [place]   APPENDECTOMY  1999   BREAST BIOPSY Right 11/2010    fibrocystic disease, [place]   CESAREAN SECTION     4 total   CHOLECYSTECTOMY  2011   SPINE SURGERY  2021   TONSILLECTOMY AND ADENOIDECTOMY  1975   TUBAL LIGATION     Patient Active Problem List   Diagnosis Date Noted   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   H/O of hemilaminectomy 02/15/2019   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   Fatigue 09/23/2017   History of pneumonia 04/30/2016   Grief reaction 04/30/2016   Vitamin D deficiency 02/22/2015   Hyperlipidemia 01/01/2015   Visit for preventive health examination 12/04/2013   Lower extremity edema 09/02/2013   Essential hypertension 09/02/2013   History of cardiomyopathy 09/02/2013   SVT (supraventricular tachycardia) (HCC) 09/02/2013   Contrast media allergy 03/10/2013   Fibrocystic breast disease    Morbid obesity (HCC) 02/05/2012   Meniere disease    REFERRING DIAG:  M79.18 (ICD-10-CM) - Myalgia, other site  Z98.890 (ICD-10-CM) - Other specified postprocedural states  M54.12  (ICD-10-CM) - Radiculopathy, cervical region   THERAPY DIAG:  Joint stiffness of spine  Pain in right arm  Muscle weakness (generalized)  PERTINENT HISTORY: See evaluation  PRECAUTIONS: N/A  12/5: Cervical AROM: L rotn.: 70 deg./ R rotn. 51 deg.  (R cervical tightness/ catching).  L UT feels tight.   Cervical flexion 60 deg./ extension 38 deg.  L lat. Flexion 38 deg./ R lat. Flexion 38 deg.  Seated B shoulder flexion/ abduction 4/5 MMT (increase R UT/ shoulder discomfort).  Grip: L 76.7#/ R 52.1#.  Reassessed R hand/finger manual dexterity.    SUBJECTIVE:  01/14/2023  Pt. Reports feeling 80% better but still has respiratory congestion.    PAIN:  Are you having pain? Yes: NPRS scale: 5/10 neck/ R arm ("numbness/tingling in pinky/ring finger").   Pain location: Neck Pain description: aching/ stiff/ tight Aggravating factors: prolonged sitting/ activity Relieving factors: stretches/ PT  Today's Treatment: 01/14/2023   There.ex.:  Seated cervical AROM reassessment.  Seated B shoulder flexion/ scaption/ abduction AROM to tolerable end-range with OP (good stretch)- no pain.   Supine 2# shoulder flexion/ horizontal abduction/ adduction 10x2.    Manual Therapy:    Supine STM/ stretches on bilateral cervical paraspinals, levator scap, upper trap, and suboccipitals;  Light cervical traction in supine 2x with holds.  No upper cervical trigger points noted.    Prone  STM with use of Hypervolt to B UT/neck musculature.    Trigger Point Dry Needling (TDN), unbilled Education previously performed with patient regarding potential benefit of TDN. Previously reviewed precautions and risks with patient. Pt provided verbal consent to treatment. In prone using clean technique TDN performed to bilateral upper traps with 4, 0.25 x 40 single needle placements (2 on each side).   Pistoning technique utilized with periods of static hold. No excessive soreness reported today following intervention;    Application of Biofreeze at end of tx.     PATIENT EDUCATION: Education details: Plan of care Person educated: Patient Education method: Explanation, Demonstration, and Handouts Education comprehension: verbalized understanding and returned demonstration   HOME EXERCISE PROGRAM: [id number].  HEP: [id number]             PT LONG TERM GOAL 1    Title Pt will improve worse cervical pain by at least 3 NPS points to allow increased tolerance during work and home related ADLs.     Baseline NPS: 8/10 current, 7/10 best, 10/10 Worse. 10/20: Worse pain 7/10.  10/12: 5/10 neck pain at worst (improving)- stress related.  4/15: 4/10. 6/13: 5/10    Time 8     Period Weeks     Status Partially Met     Target Date 02/09/2023          PT LONG TERM GOAL 2    Title Pt will improve bilat UE MMT by atleast 1/2 grade to facilitate greater functional abilities with ADLs related to caregiving.     Baseline UE MMT R/L:   Shoulder Flexion: 4/4-  Shoulder abduction: 4/4-  Shoulder ER: 4/4  Shoulder IR: 4/4  Elbow ext: 5/5   Eblow flex: 5/5 10/20: UE MMT R/L:   Shoulder Flexion: 4/4  Shoulder abduction: 4/4 Shoulder ER: 4/4  Shoulder IR: 4/4  Elbow ext: 5/5   Elbow flex: 5/5.  10/12: B shoulder flexion 4/5 MMT, abduction 4/5 MMT biceps/triceps 5/5 MMT.  6/13: B shoulder flexion 4/5 MMT, abduction 4/5 MMT biceps/triceps 5/5 MMT.  IR/ER 4+/5     Time 8     Period Weeks     Status Partially Met     Target Date 02/09/2023          PT LONG TERM GOAL 3    Title Pt will improve functional R hand strength equal to L to promote return of hand useage during grooming and feeding.     Baseline Grip R/L: 76.1 / 33.9    Lateral prehension grip R/L: 14 / 4 10/20: Grip R/L: 62.0 / 41.4    Lateral prehension grip R/L: 13 / 6.  1/12:  R 69#/ L 49.8#.  6/13: see above    Time 8     Period Weeks     Status Partially Met     Target Date 02/09/2023           Plan -     Clinical Impression Statement Continued treatment  focus on manual therapy for range of motion, tissue extensibility, and pain control. Good cervical AROM (all planes).  Pt. Has benefited from consistent trigger point dry needling to manage pain/ exacerbation of pain symptoms with daily/ work-related tasks. Encouraged continuing HEP and follow-up as scheduled.  Pt will continue to benefit from skilled PT to reduce cervical pain with ADL's and work related tasks.       Personal Factors and Comorbidities Comorbidity 2;Profession;Past/Current Experience    Comorbidities Hypertension, Obesity  Examination-Activity Limitations Squat;Locomotion Level    Examination-Participation Restrictions Occupation;Cleaning;Laundry    Stability/Clinical Decision Making Evolving/Moderate complexity    Clinical Decision Making Moderate    Rehab Potential Good    PT Frequency 1-2x/biweekly   PT Duration 8 weeks    PT Treatment/Interventions ADLs/Self Care Home Management;Aquatic Therapy;Biofeedback;Cryotherapy;Electrical Stimulation;Moist Heat;Ultrasound;Functional mobility training;Therapeutic activities;Therapeutic exercise;Balance training;Neuromuscular re-education;Patient/family education;Manual techniques;Scar mobilization;Passive range of motion;Dry needling;Energy conservation;Taping;Gait training;Stair training;DME Instruction;Spinal Manipulations;Joint Manipulations, Canalith repositioning procedure.    PT Next Visit Plan Trigger point release technique/ Hypervolt.  Attempt supine TDN to UT musculature.     PT Home Exercise Plan [id number].   HEP: [id number]    Consulted and Agree with Plan of Care Patient         Cammie Mcgee, PT, DPT # [phone] Physical Therapist- [facility]  01/14/2023, 11:51 AM

## 2023-01-19 ENCOUNTER — Ambulatory Visit: Payer: BC Managed Care – PPO | Admitting: Physical Therapy

## 2023-01-19 ENCOUNTER — Encounter: Payer: Self-pay | Admitting: Physical Therapy

## 2023-01-19 DIAGNOSIS — M6281 Muscle weakness (generalized): Secondary | ICD-10-CM

## 2023-01-19 DIAGNOSIS — M79601 Pain in right arm: Secondary | ICD-10-CM | POA: Diagnosis not present

## 2023-01-19 DIAGNOSIS — M2560 Stiffness of unspecified joint, not elsewhere classified: Secondary | ICD-10-CM

## 2023-01-19 NOTE — Therapy (Signed)
OUTPATIENT PHYSICAL THERAPY TREATMENT  Patient Name: Amanda Humphrey MRN: [id number] DOB:11/21/1965, 57 y.o., female Today's Date: 01/19/2023   PCP: Sherlene Shams, MD REFERRING PROVIDER: Sherlene Shams, MD   PT End of Session - 01/19/23 0826     Visit Number 96    Number of Visits 108    Date for PT Re-Evaluation 02/09/23    PT Start Time 0819    PT Stop Time 0905    PT Time Calculation (min) 46 min    Activity Tolerance Patient tolerated treatment well    Behavior During Therapy [facility] for tasks assessed/performed             Past Medical History:  Diagnosis Date   Allergy    Arthritis    Chronic kidney disease    stones   Fibrocystic breast disease 2013   GERD (gastroesophageal reflux disease)    Meniere disease    Migraines    Past Surgical History:  Procedure Laterality Date   ABDOMINAL HYSTERECTOMY  2011   laprascopic supracervical, [place]   APPENDECTOMY  1999   BREAST BIOPSY Right 11/2010    fibrocystic disease, [place]   CESAREAN SECTION     4 total   CHOLECYSTECTOMY  2011   SPINE SURGERY  2021   TONSILLECTOMY AND ADENOIDECTOMY  1975   TUBAL LIGATION     Patient Active Problem List   Diagnosis Date Noted   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   H/O of hemilaminectomy 02/15/2019   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   Fatigue 09/23/2017   History of pneumonia 04/30/2016   Grief reaction 04/30/2016   Vitamin D deficiency 02/22/2015   Hyperlipidemia 01/01/2015   Visit for preventive health examination 12/04/2013   Lower extremity edema 09/02/2013   Essential hypertension 09/02/2013   History of cardiomyopathy 09/02/2013   SVT (supraventricular tachycardia) (HCC) 09/02/2013   Contrast media allergy 03/10/2013   Fibrocystic breast disease    Morbid obesity (HCC) 02/05/2012   Meniere disease    REFERRING DIAG:  M79.18 (ICD-10-CM) - Myalgia, other site  Z98.890 (ICD-10-CM) - Other specified postprocedural states  M54.12  (ICD-10-CM) - Radiculopathy, cervical region   THERAPY DIAG:  Joint stiffness of spine  Pain in right arm  Muscle weakness (generalized)  PERTINENT HISTORY: See evaluation  PRECAUTIONS: N/A  12/5: Cervical AROM: L rotn.: 70 deg./ R rotn. 51 deg.  (R cervical tightness/ catching).  L UT feels tight.   Cervical flexion 60 deg./ extension 38 deg.  L lat. Flexion 38 deg./ R lat. Flexion 38 deg.  Seated B shoulder flexion/ abduction 4/5 MMT (increase R UT/ shoulder discomfort).  Grip: L 76.7#/ R 52.1#.  Reassessed R hand/finger manual dexterity.    SUBJECTIVE:  01/19/2023  Pt. Reports numbness in R 4th/5th digits and pad of thumb.   Pt. Has R sided cervical discomfort with resisted overhead ex./ reaching.    PAIN:  Are you having pain? Yes: NPRS scale: 5/10 neck/ R arm ("numbness/tingling in pinky/ring finger").   Pain location: Neck Pain description: aching/ stiff/ tight Aggravating factors: prolonged sitting/ activity Relieving factors: stretches/ PT  Today's Treatment: 01/19/2023   There.ex.:  Standing 2# shoulder flexion/ horizontal abduction/ adduction/ extension/ bicep curls/ tricep extension 10x2.  Mirror feedback/ limited with overhead flexion secondary to increase R sided neck pain.    MH to L/R shoulder/UT/low cervical musculature in sitting prior to tx.   Manual Therapy:    Supine STM/ stretches on bilateral cervical paraspinals, levator scap, upper  trap, and suboccipitals;  Light cervical traction in supine 4x with holds.  R upper/mid. Cervical discomfort and "catching" noted.   Supine L/R cervical rotn. Stretches with holds (pain tolerable range).    Prone STM with use of Hypervolt to B UT/neck musculature.    Trigger Point Dry Needling (TDN), unbilled Education previously performed with patient regarding potential benefit of TDN. Previously reviewed precautions and risks with patient. Pt provided verbal consent to treatment. In prone using clean technique TDN  performed to bilateral upper traps with 4, 0.25 x 40 single needle placements (2 on each side).   Pistoning technique utilized with periods of static hold. No excessive soreness reported today following intervention;   Application of Biofreeze at end of tx.     PATIENT EDUCATION: Education details: Plan of care Person educated: Patient Education method: Explanation, Demonstration, and Handouts Education comprehension: verbalized understanding and returned demonstration   HOME EXERCISE PROGRAM: [id number].  HEP: [id number]             PT LONG TERM GOAL 1    Title Pt will improve worse cervical pain by at least 3 NPS points to allow increased tolerance during work and home related ADLs.     Baseline NPS: 8/10 current, 7/10 best, 10/10 Worse. 10/20: Worse pain 7/10.  10/12: 5/10 neck pain at worst (improving)- stress related.  4/15: 4/10. 6/13: 5/10    Time 8     Period Weeks     Status Partially Met     Target Date 02/09/2023          PT LONG TERM GOAL 2    Title Pt will improve bilat UE MMT by atleast 1/2 grade to facilitate greater functional abilities with ADLs related to caregiving.     Baseline UE MMT R/L:   Shoulder Flexion: 4/4-  Shoulder abduction: 4/4-  Shoulder ER: 4/4  Shoulder IR: 4/4  Elbow ext: 5/5   Eblow flex: 5/5 10/20: UE MMT R/L:   Shoulder Flexion: 4/4  Shoulder abduction: 4/4 Shoulder ER: 4/4  Shoulder IR: 4/4  Elbow ext: 5/5   Elbow flex: 5/5.  10/12: B shoulder flexion 4/5 MMT, abduction 4/5 MMT biceps/triceps 5/5 MMT.  6/13: B shoulder flexion 4/5 MMT, abduction 4/5 MMT biceps/triceps 5/5 MMT.  IR/ER 4+/5     Time 8     Period Weeks     Status Partially Met     Target Date 02/09/2023          PT LONG TERM GOAL 3    Title Pt will improve functional R hand strength equal to L to promote return of hand useage during grooming and feeding.     Baseline Grip R/L: 76.1 / 33.9    Lateral prehension grip R/L: 14 / 4 10/20: Grip R/L: 62.0 / 41.4    Lateral prehension grip  R/L: 13 / 6.  1/12:  R 69#/ L 49.8#.  6/13: see above    Time 8     Period Weeks     Status Partially Met     Target Date 02/09/2023           Plan -     Clinical Impression Statement Continued treatment focus on manual therapy for range of motion, tissue extensibility, and pain control. Good cervical AROM (all planes) with increase R upper cervical "catching"/ discomfort at end-range.  Pt. Has benefited from consistent trigger point dry needling to manage pain/ exacerbation of pain symptoms with daily/ work-related tasks. Encouraged continuing  HEP and follow-up as scheduled.  Pt will continue to benefit from skilled PT to reduce cervical pain with ADL's and work related tasks.       Personal Factors and Comorbidities Comorbidity 2;Profession;Past/Current Experience    Comorbidities Hypertension, Obesity    Examination-Activity Limitations Squat;Locomotion Level    Examination-Participation Restrictions Occupation;Cleaning;Laundry    Stability/Clinical Decision Making Evolving/Moderate complexity    Clinical Decision Making Moderate    Rehab Potential Good    PT Frequency 1-2x/biweekly   PT Duration 8 weeks    PT Treatment/Interventions ADLs/Self Care Home Management;Aquatic Therapy;Biofeedback;Cryotherapy;Electrical Stimulation;Moist Heat;Ultrasound;Functional mobility training;Therapeutic activities;Therapeutic exercise;Balance training;Neuromuscular re-education;Patient/family education;Manual techniques;Scar mobilization;Passive range of motion;Dry needling;Energy conservation;Taping;Gait training;Stair training;DME Instruction;Spinal Manipulations;Joint Manipulations, Canalith repositioning procedure.    PT Next Visit Plan Trigger point release technique/ Hypervolt.  Attempt supine TDN to UT musculature.     PT Home Exercise Plan [id number].   HEP: [id number]    Consulted and Agree with Plan of Care Patient         Cammie Mcgee, PT, DPT # [phone] Physical Therapist- [facility]   [facility]  01/19/2023, 09:44 AM

## 2023-01-26 ENCOUNTER — Ambulatory Visit: Payer: BC Managed Care – PPO | Admitting: Physical Therapy

## 2023-01-26 DIAGNOSIS — M6281 Muscle weakness (generalized): Secondary | ICD-10-CM | POA: Diagnosis not present

## 2023-01-26 DIAGNOSIS — M2560 Stiffness of unspecified joint, not elsewhere classified: Secondary | ICD-10-CM

## 2023-01-26 DIAGNOSIS — M79601 Pain in right arm: Secondary | ICD-10-CM | POA: Diagnosis not present

## 2023-01-26 NOTE — Therapy (Addendum)
OUTPATIENT PHYSICAL THERAPY TREATMENT  Patient Name: Amanda Humphrey MRN: [id number] DOB:[birth year], 57 y.o., female Today's Date: 01/26/2023   PCP: Sherlene Shams, MD REFERRING PROVIDER: Sherlene Shams, MD   PT End of Session - 01/26/23 0734     Visit Number 97    Number of Visits 108    Date for PT Re-Evaluation 02/09/23    PT Start Time 0734    Activity Tolerance Patient tolerated treatment well    Behavior During Therapy [facility] for tasks assessed/performed             Past Medical History:  Diagnosis Date   Allergy    Arthritis    Chronic kidney disease    stones   Fibrocystic breast disease 2013   GERD (gastroesophageal reflux disease)    Meniere disease    Migraines    Past Surgical History:  Procedure Laterality Date   ABDOMINAL HYSTERECTOMY  2011   laprascopic supracervical, [place]   APPENDECTOMY  1999   BREAST BIOPSY Right 11/2010    fibrocystic disease, [place]   CESAREAN SECTION     4 total   CHOLECYSTECTOMY  2011   SPINE SURGERY  2021   TONSILLECTOMY AND ADENOIDECTOMY  1975   TUBAL LIGATION     Patient Active Problem List   Diagnosis Date Noted   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   H/O of hemilaminectomy 02/15/2019   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   Fatigue 09/23/2017   History of pneumonia 04/30/2016   Grief reaction 04/30/2016   Vitamin D deficiency 02/22/2015   Hyperlipidemia 01/01/2015   Visit for preventive health examination 12/04/2013   Lower extremity edema 09/02/2013   Essential hypertension 09/02/2013   History of cardiomyopathy 09/02/2013   SVT (supraventricular tachycardia) (HCC) 09/02/2013   Contrast media allergy 03/10/2013   Fibrocystic breast disease    Morbid obesity (HCC) 02/05/2012   Meniere disease    REFERRING DIAG:  M79.18 (ICD-10-CM) - Myalgia, other site  Z98.890 (ICD-10-CM) - Other specified postprocedural states  M54.12 (ICD-10-CM) - Radiculopathy, cervical region   THERAPY DIAG:   Joint stiffness of spine  Pain in right arm  Muscle weakness (generalized)  PERTINENT HISTORY: See evaluation  PRECAUTIONS: N/A  12/5: Cervical AROM: L rotn.: 70 deg./ R rotn. 51 deg.  (R cervical tightness/ catching).  L UT feels tight.   Cervical flexion 60 deg./ extension 38 deg.  L lat. Flexion 38 deg./ R lat. Flexion 38 deg.  Seated B shoulder flexion/ abduction 4/5 MMT (increase R UT/ shoulder discomfort).  Grip: L 76.7#/ R 52.1#.  Reassessed R hand/finger manual dexterity.    SUBJECTIVE:  01/26/2023  Pt. Reports continued numbness in R 4th/5th digits and pad of thumb.   Pt. Has R sided cervical discomfort with generalized movement.  No worsening in symptoms reported over weekend.  Pt. Has no upcoming MD appts. Or massages scheduled.  Pt. Is hoping to get a massage appt. Soon.    PAIN:  Are you having pain? Yes: NPRS scale: 5/10 neck/ R arm ("numbness/tingling in pinky/ring finger").   Pain location: Neck Pain description: aching/ stiff/ tight Aggravating factors: prolonged sitting/ activity Relieving factors: stretches/ PT  Today's Treatment: 01/26/2023   Manual Therapy:    Seated STM to B UT (R>L) with shoulder flexion (added OP).    Supine STM/ stretches on bilateral cervical paraspinals, levator scap, upper trap, and suboccipitals;  Light cervical traction in supine 3x with holds.  Supine L/R cervical rotn. Stretches with holds (  pain tolerable range).  Seated L/R lateral flexion with OP 2x each.    Prone STM with use of Hypervolt to B UT/neck musculature.    Trigger Point Dry Needling (TDN), unbilled Education previously performed with patient regarding potential benefit of TDN. Previously reviewed precautions and risks with patient. Pt provided verbal consent to treatment. In prone using clean technique TDN performed to bilateral upper traps with 4, 0.25 x 40 single needle placements (2 on each side).   Pistoning technique utilized with periods of static hold. No  excessive soreness reported today following intervention;   Application of Biofreeze at end of tx.     PATIENT EDUCATION: Education details: Plan of care Person educated: Patient Education method: Explanation, Demonstration, and Handouts Education comprehension: verbalized understanding and returned demonstration   HOME EXERCISE PROGRAM: [id number].  HEP: [id number]             PT LONG TERM GOAL 1    Title Pt will improve worse cervical pain by at least 3 NPS points to allow increased tolerance during work and home related ADLs.     Baseline NPS: 8/10 current, 7/10 best, 10/10 Worse. 10/20: Worse pain 7/10.  10/12: 5/10 neck pain at worst (improving)- stress related.  4/15: 4/10. 6/13: 5/10    Time 8     Period Weeks     Status Partially Met     Target Date 02/09/2023          PT LONG TERM GOAL 2    Title Pt will improve bilat UE MMT by atleast 1/2 grade to facilitate greater functional abilities with ADLs related to caregiving.     Baseline UE MMT R/L:   Shoulder Flexion: 4/4-  Shoulder abduction: 4/4-  Shoulder ER: 4/4  Shoulder IR: 4/4  Elbow ext: 5/5   Eblow flex: 5/5 10/20: UE MMT R/L:   Shoulder Flexion: 4/4  Shoulder abduction: 4/4 Shoulder ER: 4/4  Shoulder IR: 4/4  Elbow ext: 5/5   Elbow flex: 5/5.  10/12: B shoulder flexion 4/5 MMT, abduction 4/5 MMT biceps/triceps 5/5 MMT.  6/13: B shoulder flexion 4/5 MMT, abduction 4/5 MMT biceps/triceps 5/5 MMT.  IR/ER 4+/5     Time 8     Period Weeks     Status Partially Met     Target Date 02/09/2023          PT LONG TERM GOAL 3    Title Pt will improve functional R hand strength equal to L to promote return of hand useage during grooming and feeding.     Baseline Grip R/L: 76.1 / 33.9    Lateral prehension grip R/L: 14 / 4 10/20: Grip R/L: 62.0 / 41.4    Lateral prehension grip R/L: 13 / 6.  1/12:  R 69#/ L 49.8#.  6/13: see above    Time 8     Period Weeks     Status Partially Met     Target Date 02/09/2023           Plan -      Clinical Impression Statement Continued treatment focus on manual therapy for range of motion, tissue extensibility, and pain control. Good cervical AROM (all planes) with increase R upper cervical tenderness with palpation/ discomfort at end-range.  Pt. Has benefited from consistent trigger point dry needling to manage pain/ exacerbation of pain symptoms with daily/ work-related tasks. Encouraged continuing HEP and follow-up as scheduled.  Pt will continue to benefit from skilled PT to reduce cervical pain  with ADL's and work related tasks.       Personal Factors and Comorbidities Comorbidity 2;Profession;Past/Current Experience    Comorbidities Hypertension, Obesity    Examination-Activity Limitations Squat;Locomotion Level    Examination-Participation Restrictions Occupation;Cleaning;Laundry    Stability/Clinical Decision Making Evolving/Moderate complexity    Clinical Decision Making Moderate    Rehab Potential Good    PT Frequency 1-2x/biweekly   PT Duration 8 weeks    PT Treatment/Interventions ADLs/Self Care Home Management;Aquatic Therapy;Biofeedback;Cryotherapy;Electrical Stimulation;Moist Heat;Ultrasound;Functional mobility training;Therapeutic activities;Therapeutic exercise;Balance training;Neuromuscular re-education;Patient/family education;Manual techniques;Scar mobilization;Passive range of motion;Dry needling;Energy conservation;Taping;Gait training;Stair training;DME Instruction;Spinal Manipulations;Joint Manipulations, Canalith repositioning procedure.    PT Next Visit Plan Trigger point release technique/ Hypervolt.  Attempt supine TDN to UT musculature.     PT Home Exercise Plan [id number].   HEP: [id number]    Consulted and Agree with Plan of Care Patient         Cammie Mcgee, PT, DPT # [phone] Physical Therapist- [facility]  [facility]  01/26/2023, 08:21 AM

## 2023-01-31 ENCOUNTER — Ambulatory Visit (INDEPENDENT_AMBULATORY_CARE_PROVIDER_SITE_OTHER): Payer: BC Managed Care – PPO

## 2023-01-31 ENCOUNTER — Ambulatory Visit
Admission: RE | Admit: 2023-01-31 | Discharge: 2023-01-31 | Disposition: A | Discharge status: Home / Self Care | Payer: BC Managed Care – PPO | Source: Ambulatory Visit | Attending: Physician Assistant | Admitting: Physician Assistant

## 2023-01-31 VITALS — BP 158/78 | HR 90 | Temp 98.5°F | Resp 16 | Ht 67.5 in | Wt 246.0 lb

## 2023-01-31 DIAGNOSIS — R051 Acute cough: Secondary | ICD-10-CM

## 2023-01-31 DIAGNOSIS — J209 Acute bronchitis, unspecified: Secondary | ICD-10-CM | POA: Insufficient documentation

## 2023-01-31 DIAGNOSIS — R5383 Other fatigue: Secondary | ICD-10-CM | POA: Insufficient documentation

## 2023-01-31 DIAGNOSIS — K449 Diaphragmatic hernia without obstruction or gangrene: Secondary | ICD-10-CM | POA: Diagnosis not present

## 2023-01-31 DIAGNOSIS — R062 Wheezing: Secondary | ICD-10-CM | POA: Insufficient documentation

## 2023-01-31 LAB — RESP PANEL BY RT-PCR (RSV, FLU A&B, COVID)  RVPGX2
Influenza A by PCR: NEGATIVE
Influenza B by PCR: NEGATIVE
Resp Syncytial Virus by PCR: NEGATIVE
SARS Coronavirus 2 by RT PCR: NEGATIVE

## 2023-01-31 MED ORDER — BENZONATATE 200 MG PO CAPS: 200.0000 mg | ORAL_CAPSULE | Freq: Three times a day (TID) | ORAL | 0 refills | Status: DC | PRN

## 2023-01-31 MED ORDER — PREDNISONE 10 MG PO TABS: ORAL_TABLET | ORAL | 0 refills | Status: DC

## 2023-01-31 MED ORDER — ALBUTEROL SULFATE HFA 108 (90 BASE) MCG/ACT IN AERS: 1.0000 | INHALATION_SPRAY | Freq: Four times a day (QID) | RESPIRATORY_TRACT | 0 refills | Status: DC | PRN

## 2023-01-31 NOTE — Discharge Instructions (Signed)
-  Checking for pneumonia, COVID, flu and RSV.  I will call you once I receive all your results and we will go from there. - I sent more cough medicine and an inhaler. - Increase rest and fluids. - If you have pneumonia I will send antibiotics.  If you have COVID, flu or RSV these symptoms you are experiencing are related to the viral illnesses and treatment is mostly supportive with cough medicines, inhalers and corticosteroids if needed. - If at any point you have return of the fever, chest pain or worsening breathing problem he may need to go to the ER. - Bronchitis can last for a few weeks sometimes.

## 2023-01-31 NOTE — ED Provider Notes (Signed)
 [facility]    CSN: [id number] Arrival date & time: 01/31/23  1030      History   Chief Complaint Chief Complaint  Patient presents with   Fever    Appointment    HPI Amanda Humphrey is a 58 y.o. female presenting for 4 to 5-day history of productive cough, nasal congestion, fatigue, wheezing and shortness of breath.  Reports that she felt feverish for the first 3 days but believes she may have broken the fever.  Current temp 98.5 degrees.  She reports similar symptoms last month and so she was treated with cough medications, an inhaler, antibiotics and steroids.  Reports that she felt better for about a week or so before becoming ill again.  Patient does work in teacher, music.  Has been taking prescribed cough meds from last month.  Patient does report that her husband is on home oxygen and has heart problems so she is concerned about potentially getting him sick and has been keeping her distance.  No other complaints.  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   H/O of hemilaminectomy 02/15/2019   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   Fatigue 09/23/2017   History of pneumonia 04/30/2016   Grief reaction 04/30/2016   Vitamin D  deficiency 02/22/2015   Hyperlipidemia 01/01/2015   Visit for preventive health examination 12/04/2013   Lower extremity edema 09/02/2013   Essential hypertension 09/02/2013   History of cardiomyopathy 09/02/2013   SVT (supraventricular tachycardia) (HCC) 09/02/2013   Contrast media allergy 03/10/2013   Fibrocystic breast disease    Morbid obesity (HCC) 02/05/2012   Meniere disease     Past Surgical History:  Procedure Laterality Date   ABDOMINAL  HYSTERECTOMY  2011   laprascopic supracervical, [place]   APPENDECTOMY  1999   BREAST BIOPSY Right 11/2010    fibrocystic disease, [place]   CESAREAN SECTION     4 total   CHOLECYSTECTOMY  2011   SPINE SURGERY  2021   TONSILLECTOMY AND ADENOIDECTOMY  1975   TUBAL LIGATION      OB History   No obstetric history on file.      Home Medications    Prior to Admission medications   Medication Sig Start Date End Date Taking? Authorizing Provider  albuterol  (VENTOLIN  HFA) 108 (90 Base) MCG/ACT inhaler Inhale 1-2 puffs into the lungs every 6 (six) hours as needed for wheezing or shortness of breath. 01/31/23  Yes Arvis Jolan NOVAK, PA-C  benzonatate  (TESSALON ) 200 MG capsule Take 1 capsule (200 mg total) by mouth 3 (three) times daily as needed for cough. 01/31/23  Yes Arvis Jolan B, PA-C  predniSONE  (DELTASONE ) 10 MG tablet Take 6 tabs p.o. on day 1 and decrease by 1 tablet daily until complete 01/31/23  Yes Arvis Jolan NOVAK, PA-C  amphetamine-dextroamphetamine (ADDERALL) 20 MG tablet Take 20 mg by mouth every evening.  09/22/18   [provider]  carvedilol  (COREG ) 6.25 MG tablet Take 1 tablet (6.25 mg total) by mouth 2 (two) times daily with a meal. TAKE 1 TABLET TWICE A DAY 12/02/22   Gollan, Timothy J, MD  cyclobenzaprine  (FLEXERIL ) 10 MG tablet Take 1  tablet (10 mg total) by mouth at bedtime. 03/27/22   Marylynn Verneita CROME, MD  Evolocumab  (REPATHA  SURECLICK) 140 MG/ML SOAJ Inject 140 mg into the skin every 14 (fourteen) days. 12/02/22   Gollan, Timothy J, MD  ezetimibe  (ZETIA ) 10 MG tablet Take 1 tablet (10 mg total) by mouth daily. 12/02/22   Gollan, Timothy J, MD  furosemide  (LASIX ) 20 MG tablet Take 1 tablet (20 mg total) by mouth daily as needed. 12/02/22   Gollan, Timothy J, MD  hydrochlorothiazide  (HYDRODIURIL ) 25 MG tablet Take 1 tablet (25 mg total) by mouth daily. 12/02/22   Gollan, Timothy J, MD  ipratropium (ATROVENT ) 0.06 % nasal spray Place 2 sprays into both nostrils 4 (four) times  daily. 12/28/22   Bernardino Ditch, NP  methocarbamol  (ROBAXIN ) 500 MG tablet Take 1 tablet (500 mg total) by mouth 2 (two) times daily. 03/27/22   Marylynn Verneita CROME, MD  omeprazole  (PRILOSEC) 40 MG capsule TAKE 1 CAPSULE DAILY 10/14/22   Marylynn Verneita CROME, MD  promethazine -dextromethorphan (PROMETHAZINE -DM) 6.25-15 MG/5ML syrup Take 5 mLs by mouth 4 (four) times daily as needed. 12/28/22   Bernardino Ditch, NP    Family History Family History  Problem Relation Age of Onset   Hypertension Mother    Cancer Mother        ovarian   Arthritis Mother    ADD / ADHD Mother    Hyperlipidemia Father    Hypertension Father    Diabetes Father    Stroke Father    Alzheimer's disease Maternal Grandmother    Heart disease Maternal Grandmother    Breast cancer Maternal Grandmother    Alzheimer's disease Paternal Grandmother    Breast cancer Paternal Grandmother     Social History Social History   Tobacco Use   Smoking status: Never   Smokeless tobacco: Never  Vaping Use   Vaping status: Never Used  Substance Use Topics   Alcohol use: Yes    Alcohol/week: 1.0 standard drink of alcohol    Types: 1 Glasses of wine per week    Comment: social   Drug use: No     Allergies   Bee venom, Codeine, Hydrocodone, Tramadol, and Oxycodone   Review of Systems Review of Systems  Constitutional:  Positive for fatigue. Negative for chills, diaphoresis and fever.  HENT:  Positive for congestion and rhinorrhea. Negative for ear pain, sinus pressure, sinus pain and sore throat.   Respiratory:  Positive for cough and wheezing. Negative for shortness of breath.   Cardiovascular:  Negative for chest pain.  Gastrointestinal:  Negative for abdominal pain, nausea and vomiting.  Musculoskeletal:  Negative for arthralgias and myalgias.  Skin:  Negative for rash.  Neurological:  Negative for weakness and headaches.  Hematological:  Negative for adenopathy.     Physical Exam Triage Vital Signs ED Triage Vitals   Encounter Vitals Group     BP 01/31/23 1044 (!) 158/78     Systolic BP Percentile --      Diastolic BP Percentile --      Pulse Rate 01/31/23 1044 90     Resp 01/31/23 1044 16     Temp 01/31/23 1044 98.5 F (36.9 C)     Temp Source 01/31/23 1044 Oral     SpO2 01/31/23 1044 97 %     Weight 01/31/23 1042 246 lb 0.5 oz (111.6 kg)     Height 01/31/23 1042 5' 7.5 (1.715 m)     Head Circumference --  Peak Flow --      Pain Score 01/31/23 1042 0     Pain Loc --      Pain Education --      Exclude from Growth Chart --    No data found.  Updated Vital Signs BP (!) 158/78 (BP Location: Left Arm)   Pulse 90   Temp 98.5 F (36.9 C) (Oral)   Resp 16   Ht 5' 7.5 (1.715 m)   Wt 246 lb 0.5 oz (111.6 kg)   SpO2 97%   BMI 37.97 kg/m   Physical Exam Vitals and nursing note reviewed.  Constitutional:      General: She is not in acute distress.    Appearance: Normal appearance. She is ill-appearing. She is not toxic-appearing.  HENT:     Head: Normocephalic and atraumatic.     Right Ear: Tympanic membrane, ear canal and external ear normal.     Left Ear: Tympanic membrane, ear canal and external ear normal.     Nose: Congestion present.     Mouth/Throat:     Mouth: Mucous membranes are moist.     Pharynx: Oropharynx is clear.  Eyes:     General: No scleral icterus.       Right eye: No discharge.        Left eye: No discharge.     Conjunctiva/sclera: Conjunctivae normal.  Cardiovascular:     Rate and Rhythm: Normal rate and regular rhythm.     Heart sounds: Normal heart sounds.  Pulmonary:     Effort: Pulmonary effort is normal. No respiratory distress.     Breath sounds: Wheezing present.  Musculoskeletal:     Cervical back: Neck supple.  Skin:    General: Skin is dry.  Neurological:     General: No focal deficit present.     Mental Status: She is alert. Mental status is at baseline.     Motor: No weakness.     Gait: Gait normal.  Psychiatric:        Mood and  Affect: Mood normal.        Behavior: Behavior normal.      UC Treatments / Results  Labs (all labs ordered are listed, but only abnormal results are displayed) Labs Reviewed  RESP PANEL BY RT-PCR (RSV, FLU A&B, COVID)  [id number]    EKG   Radiology DG Chest 2 View Result Date: 01/31/2023 CLINICAL DATA:  Cough congestion wheezing for 5 days EXAM: CHEST - 2 VIEW COMPARISON:  X-ray 12/28/2022. FINDINGS: No consolidation, pneumothorax or effusion. No edema. Normal cardiopericardial silhouette. Moderate hiatal hernia. Curvature of the spine. IMPRESSION: No acute cardiopulmonary disease.  Hiatal hernia. Electronically Signed   By: Ranell Bring M.D.   On: 01/31/2023 11:27    Procedures Procedures (including critical care time)  Medications Ordered in UC Medications - No data to display  Initial Impression / Assessment and Plan / UC Course  I have reviewed the triage vital signs and the nursing notes.  Pertinent labs & imaging results that were available during my care of the patient were reviewed by me and considered in my medical decision making (see chart for details).   58 year old female presents for 4 to 5-day history of productive cough, nasal congestion, wheezing, shortness of breath, fatigue.  No associated fever.  Reviewed prior visit note from 12/28/2022.  Patient was treated at that time with cefdinir  x 10 days, Promethazine  DM, benzonatate , Atrovent  nasal spray.   Patient is afebrile.  Ill-appearing  but nontoxic.  No distress.  On exam has nasal congestion.  Throat is clear.  No evidence of an ear infection.  Diffuse wheezing throughout all lung fields.  Respiratory panel and chest x-ray obtained.  Advised patient I will contact her with the results.  Respiratory panel negative and chest x-ray interpreted as within normal limits.  Attempted to contact patient but it went to voicemail so I sent her a MyChart message.  Likely viral bronchitis.  Antibiotics not indicated at  this time.  Sent benzonatate  to pharmacy as well as refill inhaler and sent more prednisone  to help with the wheezing and breathing difficulty.  Patient was encouraged increased rest and fluids.  Advised to follow-up with primary care provider especially if symptoms are not improving after couple weeks, they worsen or are recurrent.  Acute illness with systemic symptoms.  Final Clinical Impressions(s) / UC Diagnoses   Final diagnoses:  Acute cough  Acute bronchitis, unspecified organism  Wheezing  Other fatigue     Discharge Instructions      -Checking for pneumonia, COVID, flu and RSV.  I will call you once I receive all your results and we will go from there. - I sent more cough medicine and an inhaler. - Increase rest and fluids. - If you have pneumonia I will send antibiotics.  If you have COVID, flu or RSV these symptoms you are experiencing are related to the viral illnesses and treatment is mostly supportive with cough medicines, inhalers and corticosteroids if needed. - If at any point you have return of the fever, chest pain or worsening breathing problem he may need to go to the ER. - Bronchitis can last for a few weeks sometimes.     ED Prescriptions     Medication Sig Dispense Auth. Provider   benzonatate  (TESSALON ) 200 MG capsule Take 1 capsule (200 mg total) by mouth 3 (three) times daily as needed for cough. 30 capsule Arvis Huxley B, PA-C   albuterol  (VENTOLIN  HFA) 108 (90 Base) MCG/ACT inhaler Inhale 1-2 puffs into the lungs every 6 (six) hours as needed for wheezing or shortness of breath. 1 g Arvis Huxley B, PA-C   predniSONE  (DELTASONE ) 10 MG tablet Take 6 tabs p.o. on day 1 and decrease by 1 tablet daily until complete 21 tablet Arvis Huxley NOVAK, PA-C      PDMP not reviewed this encounter.   Arvis Huxley NOVAK, PA-C 01/31/23 1317

## 2023-01-31 NOTE — ED Triage Notes (Signed)
 Patient cough and chest congestion that started on Monday.  Patient reports some wheezing.  Patient denies fevers.

## 2023-02-05 ENCOUNTER — Encounter: Payer: Self-pay | Admitting: Physical Therapy

## 2023-02-05 ENCOUNTER — Ambulatory Visit: Payer: BC Managed Care – PPO | Attending: Internal Medicine | Admitting: Physical Therapy

## 2023-02-05 DIAGNOSIS — M2560 Stiffness of unspecified joint, not elsewhere classified: Secondary | ICD-10-CM | POA: Insufficient documentation

## 2023-02-05 DIAGNOSIS — M79601 Pain in right arm: Secondary | ICD-10-CM | POA: Diagnosis not present

## 2023-02-05 DIAGNOSIS — M6281 Muscle weakness (generalized): Secondary | ICD-10-CM | POA: Diagnosis not present

## 2023-02-05 NOTE — Therapy (Signed)
 OUTPATIENT PHYSICAL THERAPY TREATMENT  Patient Name: Amanda Humphrey MRN: [id number] DOB:10/27/1965, 58 y.o., female Today's Date: 02/05/2023   PCP: Marylynn Verneita CROME, MD REFERRING PROVIDER: Marylynn Verneita CROME, MD   PT End of Session - 02/05/23 1056     Visit Number 98    Number of Visits 108    Date for PT Re-Evaluation 02/09/23    PT Start Time 1114    PT Stop Time 1201    PT Time Calculation (min) 47 min    Activity Tolerance Patient tolerated treatment well    Behavior During Therapy [facility] for tasks assessed/performed             Past Medical History:  Diagnosis Date   Allergy    Arthritis    Chronic kidney disease    stones   Fibrocystic breast disease 2013   GERD (gastroesophageal reflux disease)    Meniere disease    Migraines    Past Surgical History:  Procedure Laterality Date   ABDOMINAL HYSTERECTOMY  2011   laprascopic supracervical, [place]   APPENDECTOMY  1999   BREAST BIOPSY Right 11/2010    fibrocystic disease, [place]   CESAREAN SECTION     4 total   CHOLECYSTECTOMY  2011   SPINE SURGERY  2021   TONSILLECTOMY AND ADENOIDECTOMY  1975   TUBAL LIGATION     Patient Active Problem List   Diagnosis Date Noted   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   H/O of hemilaminectomy 02/15/2019   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   Fatigue 09/23/2017   History of pneumonia 04/30/2016   Grief reaction 04/30/2016   Vitamin D  deficiency 02/22/2015   Hyperlipidemia 01/01/2015   Visit for preventive health examination 12/04/2013   Lower extremity edema 09/02/2013   Essential hypertension 09/02/2013   History of cardiomyopathy 09/02/2013   SVT (supraventricular tachycardia) (HCC) 09/02/2013   Contrast media allergy 03/10/2013   Fibrocystic breast disease    Morbid obesity (HCC) 02/05/2012   Meniere disease    REFERRING DIAG:  M79.18 (ICD-10-CM) - Myalgia, other site  Z98.890 (ICD-10-CM) - Other specified postprocedural states  M54.12  (ICD-10-CM) - Radiculopathy, cervical region   THERAPY DIAG:  Joint stiffness of spine  Pain in right arm  Muscle weakness (generalized)  PERTINENT HISTORY: See evaluation  PRECAUTIONS: N/A  12/5: Cervical AROM: L rotn.: 70 deg./ R rotn. 51 deg.  (R cervical tightness/ catching).  L UT feels tight.   Cervical flexion 60 deg./ extension 38 deg.  L lat. Flexion 38 deg./ R lat. Flexion 38 deg.  Seated B shoulder flexion/ abduction 4/5 MMT (increase R UT/ shoulder discomfort).  Grip: L 76.7#/ R 52.1#.  Reassessed R hand/finger manual dexterity.    SUBJECTIVE:  02/05/2023  Pt. Reports continued neck/ upper back stiffness and soreness.  Pt. Continues to have a residual upper respiratory infection and went to Urgent Care last Friday.   Pt. Has no upcoming MD appts. Or massages scheduled.  Pt. Is hoping to get a massage appt. Soon.    PAIN:  Are you having pain? Yes: NPRS scale: 5/10 neck/ R arm (numbness/tingling in pinky/ring finger).   Pain location: Neck Pain description: aching/ stiff/ tight Aggravating factors: prolonged sitting/ activity Relieving factors: stretches/ PT  Today's Treatment: 02/05/2023   Manual Therapy:    Seated/ prone STM to B UT (R>L)/ mid-thoracic paraspinals.  No Hypervolt.  Use of MH to back in prone during STM/ reassessment of thoracic spine mobility.    Trigger Point  Dry Needling (TDN), unbilled Education previously performed with patient regarding potential benefit of TDN. Previously reviewed precautions and risks with patient. Pt provided verbal consent to treatment. In prone using clean technique TDN performed to bilateral upper traps with 5, 0.25 x 40 mm single needle placements (3 on R and 2 on L each side).   Pistoning technique utilized with periods of static hold. No excessive soreness reported today following intervention.  1 episode of sharp discomfort during L UT pistoning technique.     Supine STM/ stretches on bilateral cervical paraspinals, levator  scap, upper trap.  Supine cervical traction/ suboccipital release technique in supine 3x with holds.  Supine L/R cervical rotn. Stretches with holds (pain tolerable range).     Application of Biofreeze to neck/ upper back in sitting after tx. Session.       PATIENT EDUCATION: Education details: Plan of care Person educated: Patient Education method: Explanation, Demonstration, and Handouts Education comprehension: verbalized understanding and returned demonstration   HOME EXERCISE PROGRAM: [id number].  HEP: [id number]             PT LONG TERM GOAL 1    Title Pt will improve worse cervical pain by at least 3 NPS points to allow increased tolerance during work and home related ADLs.     Baseline NPS: 8/10 current, 7/10 best, 10/10 Worse. 10/20: Worse pain 7/10.  10/12: 5/10 neck pain at worst (improving)- stress related.  4/15: 4/10. 6/13: 5/10    Time 8     Period Weeks     Status Partially Met     Target Date 02/09/2023          PT LONG TERM GOAL 2    Title Pt will improve bilat UE MMT by atleast 1/2 grade to facilitate greater functional abilities with ADLs related to caregiving.     Baseline UE MMT R/L:   Shoulder Flexion: 4/4-  Shoulder abduction: 4/4-  Shoulder ER: 4/4  Shoulder IR: 4/4  Elbow ext: 5/5   Eblow flex: 5/5 10/20: UE MMT R/L:   Shoulder Flexion: 4/4  Shoulder abduction: 4/4 Shoulder ER: 4/4  Shoulder IR: 4/4  Elbow ext: 5/5   Elbow flex: 5/5.  10/12: B shoulder flexion 4/5 MMT, abduction 4/5 MMT biceps/triceps 5/5 MMT.  6/13: B shoulder flexion 4/5 MMT, abduction 4/5 MMT biceps/triceps 5/5 MMT.  IR/ER 4+/5     Time 8     Period Weeks     Status Partially Met     Target Date 02/09/2023          PT LONG TERM GOAL 3    Title Pt will improve functional R hand strength equal to L to promote return of hand useage during grooming and feeding.     Baseline Grip R/L: 76.1 / 33.9    Lateral prehension grip R/L: 14 / 4 10/20: Grip R/L: 62.0 / 41.4    Lateral prehension grip R/L:  13 / 6.  1/12:  R 69#/ L 49.8#.  6/13: see above    Time 8     Period Weeks     Status Partially Met     Target Date 02/09/2023           Plan -     Clinical Impression Statement Continued treatment focus on manual therapy for range of motion, tissue extensibility, and pain control. Good cervical AROM (all planes) with increase R upper cervical tenderness with palpation/ discomfort at end-range.  Moderate UT trigger point noted and  pt. Had an episode of sharp pain during TDN to L UT in prone position.  Pt. Has benefited from consistent trigger point dry needling to manage pain/ exacerbation of pain symptoms with daily/ work-related tasks. Encouraged continuing HEP and follow-up as scheduled.  Pt will continue to benefit from skilled PT to reduce cervical pain with ADL's and work related tasks.       Personal Factors and Comorbidities Comorbidity 2;Profession;Past/Current Experience    Comorbidities Hypertension, Obesity    Examination-Activity Limitations Squat;Locomotion Level    Examination-Participation Restrictions Occupation;Cleaning;Laundry    Stability/Clinical Decision Making Evolving/Moderate complexity    Clinical Decision Making Moderate    Rehab Potential Good    PT Frequency 1-2x/biweekly   PT Duration 8 weeks    PT Treatment/Interventions ADLs/Self Care Home Management;Aquatic Therapy;Biofeedback;Cryotherapy;Electrical Stimulation;Moist Heat;Ultrasound;Functional mobility training;Therapeutic activities;Therapeutic exercise;Balance training;Neuromuscular re-education;Patient/family education;Manual techniques;Scar mobilization;Passive range of motion;Dry needling;Energy conservation;Taping;Gait training;Stair training;DME Instruction;Spinal Manipulations;Joint Manipulations, Canalith repositioning procedure.    PT Next Visit Plan Trigger point release technique/ Hypervolt.  Attempt supine TDN to UT musculature.     PT Home Exercise Plan [id number].   HEP: [id number]    Consulted  and Agree with Plan of Care Patient         Ozell JAYSON Sero, PT, DPT # [phone] Physical Therapist- [facility]  02/05/2023, 12:41 PM

## 2023-02-12 ENCOUNTER — Ambulatory Visit: Payer: BC Managed Care – PPO | Admitting: Physical Therapy

## 2023-02-19 ENCOUNTER — Ambulatory Visit: Payer: BC Managed Care – PPO | Admitting: Physical Therapy

## 2023-02-19 ENCOUNTER — Encounter: Payer: Self-pay | Admitting: Physical Therapy

## 2023-02-19 DIAGNOSIS — M79601 Pain in right arm: Secondary | ICD-10-CM | POA: Diagnosis not present

## 2023-02-19 DIAGNOSIS — M6281 Muscle weakness (generalized): Secondary | ICD-10-CM | POA: Diagnosis not present

## 2023-02-19 DIAGNOSIS — M2560 Stiffness of unspecified joint, not elsewhere classified: Secondary | ICD-10-CM | POA: Diagnosis not present

## 2023-02-19 NOTE — Therapy (Signed)
OUTPATIENT PHYSICAL THERAPY TREATMENT/ RECERTIFICATION  Patient Name: Amanda Humphrey MRN: [id number] DOB:September 30, 1965, 58 y.o., female Today's Date: 02/19/2023   PCP: Sherlene Shams, MD REFERRING PROVIDER: Sherlene Shams, MD   PT End of Session - 02/19/23 1408     Visit Number 99    Number of Visits 108    Date for PT Re-Evaluation 04/16/23    PT Start Time 1120    PT Stop Time 1203    PT Time Calculation (min) 43 min    Activity Tolerance Patient tolerated treatment well    Behavior During Therapy [facility] for tasks assessed/performed             Past Medical History:  Diagnosis Date   Allergy    Arthritis    Chronic kidney disease    stones   Fibrocystic breast disease 2013   GERD (gastroesophageal reflux disease)    Meniere disease    Migraines    Past Surgical History:  Procedure Laterality Date   ABDOMINAL HYSTERECTOMY  2011   laprascopic supracervical, [place]   APPENDECTOMY  1999   BREAST BIOPSY Right 11/2010    fibrocystic disease, [place]   CESAREAN SECTION     4 total   CHOLECYSTECTOMY  2011   SPINE SURGERY  2021   TONSILLECTOMY AND ADENOIDECTOMY  1975   TUBAL LIGATION     Patient Active Problem List   Diagnosis Date Noted   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   H/O of hemilaminectomy 02/15/2019   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   Fatigue 09/23/2017   History of pneumonia 04/30/2016   Grief reaction 04/30/2016   Vitamin D deficiency 02/22/2015   Hyperlipidemia 01/01/2015   Visit for preventive health examination 12/04/2013   Lower extremity edema 09/02/2013   Essential hypertension 09/02/2013   History of cardiomyopathy 09/02/2013   SVT (supraventricular tachycardia) (HCC) 09/02/2013   Contrast media allergy 03/10/2013   Fibrocystic breast disease    Morbid obesity (HCC) 02/05/2012   Meniere disease    REFERRING DIAG:  M79.18 (ICD-10-CM) - Myalgia, other site  Z98.890 (ICD-10-CM) - Other specified postprocedural  states  M54.12 (ICD-10-CM) - Radiculopathy, cervical region   THERAPY DIAG:  Joint stiffness of spine  Pain in right arm  Muscle weakness (generalized)  PERTINENT HISTORY: See evaluation  PRECAUTIONS: N/A  12/5: Cervical AROM: L rotn.: 70 deg./ R rotn. 51 deg.  (R cervical tightness/ catching).  L UT feels tight.   Cervical flexion 60 deg./ extension 38 deg.  L lat. Flexion 38 deg./ R lat. Flexion 38 deg.  Seated B shoulder flexion/ abduction 4/5 MMT (increase R UT/ shoulder discomfort).  Grip: L 76.7#/ R 52.1#.  Reassessed R hand/finger manual dexterity.    SUBJECTIVE:  02/19/2023  Pt. Reports continued neck/ upper back stiffness with prolonged sitting/ work-related tasks.  Pt. Has no upcoming MD appts. Or massages scheduled.  Pt. Is hoping to get a massage appt. Soon.  Pt. Presents with rounded shoulder posture in standing/ sitting.   PAIN:  Are you having pain? Yes: NPRS scale: 5/10 neck/ R arm ("numbness/tingling in pinky/ring finger").   Pain location: Neck Pain description: aching/ stiff/ tight Aggravating factors: prolonged sitting/ activity Relieving factors: stretches/ PT  Today's Treatment: 02/19/2023   Manual Therapy:    Seated/ prone STM to B UT (R>L)/ mid-thoracic paraspinals.   Reassessment of cervical AROM.      Supine STM/ stretches on bilateral cervical paraspinals, levator scap, upper trap.  Supine cervical traction/ suboccipital release  technique in supine 3x with holds.  Supine L/R cervical rotn. Stretches with holds (pain tolerable range).     Trigger Point Dry Needling (TDN), unbilled Education previously performed with patient regarding potential benefit of TDN. Previously reviewed precautions and risks with patient. Pt provided verbal consent to treatment. In prone using clean technique TDN performed to bilateral upper traps with 5, 0.25 x 40 mm single needle placements (3 on R and 2 on L each side).   Pistoning technique utilized with periods of static hold.  No excessive soreness reported today following intervention.  1 episode of sharp discomfort during L UT pistoning technique.     Application of Biofreeze to neck/ upper back in sitting after tx. Session.       PATIENT EDUCATION: Education details: Plan of care Person educated: Patient Education method: Explanation, Demonstration, and Handouts Education comprehension: verbalized understanding and returned demonstration   HOME EXERCISE PROGRAM: [id number].  HEP: [id number]             PT LONG TERM GOAL 1    Title Pt will improve worse cervical pain by at least 3 NPS points to allow increased tolerance during work and home related ADLs.     Baseline NPS: 8/10 current, 7/10 best, 10/10 Worse. 10/20: Worse pain 7/10.  10/12: 5/10 neck pain at worst (improving)- stress related.  4/15: 4/10. 6/13: 5/10    Time 8     Period Weeks     Status Partially Met     Target Date 04/16/2023          PT LONG TERM GOAL 2    Title Pt will improve bilat UE MMT by atleast 1/2 grade to facilitate greater functional abilities with ADLs related to caregiving.     Baseline UE MMT R/L:   Shoulder Flexion: 4/4-  Shoulder abduction: 4/4-  Shoulder ER: 4/4  Shoulder IR: 4/4  Elbow ext: 5/5   Eblow flex: 5/5 10/20: UE MMT R/L:   Shoulder Flexion: 4/4  Shoulder abduction: 4/4 Shoulder ER: 4/4  Shoulder IR: 4/4  Elbow ext: 5/5   Elbow flex: 5/5.  10/12: B shoulder flexion 4/5 MMT, abduction 4/5 MMT biceps/triceps 5/5 MMT.  6/13: B shoulder flexion 4/5 MMT, abduction 4/5 MMT biceps/triceps 5/5 MMT.  IR/ER 4+/5     Time 8     Period Weeks     Status Partially Met     Target Date 04/16/2023          PT LONG TERM GOAL 3    Title Pt will report no B UT muscle tenderness/ trigger points to increase pain-free cervical AROM (all planes) to improve pain-free mobility.      Baseline B UT trigger points.  Muscle tightness has improved over past several months with manual tx./ massage/ TDN.     Time 8     Period Weeks     Status  Partially Met     Target Date 04/16/2023           Plan -     Clinical Impression Statement Continued treatment focus on manual therapy for range of motion, tissue extensibility, and pain control. Good cervical AROM (all planes) with increase R upper cervical tenderness with palpation/ discomfort at end-range.  Moderate UT trigger point noted and pt. Had an episode of sharp pain during TDN to R UT in prone position.  Pt. Has benefited from consistent trigger point dry needling to manage pain/ exacerbation of pain symptoms with daily/ work-related tasks.  Encouraged continuing HEP and follow-up as scheduled.  Pt will continue to benefit from skilled PT to reduce cervical pain with ADL's and work related tasks.       Personal Factors and Comorbidities Comorbidity 2;Profession;Past/Current Experience    Comorbidities Hypertension, Obesity    Examination-Activity Limitations Squat;Locomotion Level    Examination-Participation Restrictions Occupation;Cleaning;Laundry    Stability/Clinical Decision Making Evolving/Moderate complexity    Clinical Decision Making Moderate    Rehab Potential Good    PT Frequency 1-2x/biweekly   PT Duration 8 weeks    PT Treatment/Interventions ADLs/Self Care Home Management;Aquatic Therapy;Biofeedback;Cryotherapy;Electrical Stimulation;Moist Heat;Ultrasound;Functional mobility training;Therapeutic activities;Therapeutic exercise;Balance training;Neuromuscular re-education;Patient/family education;Manual techniques;Scar mobilization;Passive range of motion;Dry needling;Energy conservation;Taping;Gait training;Stair training;DME Instruction;Spinal Manipulations;Joint Manipulations, Canalith repositioning procedure.    PT Next Visit Plan Trigger point release technique/ Hypervolt.  Attempt supine TDN to UT musculature.     PT Home Exercise Plan [id number].   HEP: [id number]    Consulted and Agree with Plan of Care Patient         Cammie Mcgee, PT, DPT # [phone] Physical  Therapist- [facility]  02/19/2023, 08:41 PM

## 2023-02-25 ENCOUNTER — Other Ambulatory Visit: Payer: Self-pay | Admitting: Cardiovascular Disease

## 2023-02-26 ENCOUNTER — Ambulatory Visit: Payer: BC Managed Care – PPO | Admitting: Physical Therapy

## 2023-03-05 ENCOUNTER — Ambulatory Visit: Payer: BC Managed Care – PPO | Attending: Internal Medicine | Admitting: Physical Therapy

## 2023-03-05 DIAGNOSIS — M6281 Muscle weakness (generalized): Secondary | ICD-10-CM | POA: Diagnosis not present

## 2023-03-05 DIAGNOSIS — M2560 Stiffness of unspecified joint, not elsewhere classified: Secondary | ICD-10-CM | POA: Diagnosis not present

## 2023-03-05 DIAGNOSIS — M79601 Pain in right arm: Secondary | ICD-10-CM | POA: Insufficient documentation

## 2023-03-07 NOTE — Therapy (Addendum)
 OUTPATIENT PHYSICAL THERAPY TREATMENT Physical Therapy Progress Note  Dates of reporting period  09/30/22   to   03/05/23   Patient Name: Amanda Humphrey MRN: [id number] DOB:Nov 14, 1965, 58 y.o., female Today's Date: 03/05/2023  PCP: Marylynn Verneita CROME, MD REFERRING PROVIDER: Marylynn Verneita CROME, MD   PT End of Session - 03/07/23 1346     Visit Number 100    Number of Visits 108    Date for PT Re-Evaluation 04/16/23    PT Start Time 1116    PT Stop Time 1202    PT Time Calculation (min) 46 min    Activity Tolerance Patient tolerated treatment well    Behavior During Therapy [facility] for tasks assessed/performed             Past Medical History:  Diagnosis Date   Allergy    Arthritis    Chronic kidney disease    stones   Fibrocystic breast disease 2013   GERD (gastroesophageal reflux disease)    Meniere disease    Migraines    Past Surgical History:  Procedure Laterality Date   ABDOMINAL HYSTERECTOMY  2011   laprascopic supracervical, [place]   APPENDECTOMY  1999   BREAST BIOPSY Right 11/2010    fibrocystic disease, [place]   CESAREAN SECTION     4 total   CHOLECYSTECTOMY  2011   SPINE SURGERY  2021   TONSILLECTOMY AND ADENOIDECTOMY  1975   TUBAL LIGATION     Patient Active Problem List   Diagnosis Date Noted   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   H/O of hemilaminectomy 02/15/2019   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   Fatigue 09/23/2017   History of pneumonia 04/30/2016   Grief reaction 04/30/2016   Vitamin D  deficiency 02/22/2015   Hyperlipidemia 01/01/2015   Visit for preventive health examination 12/04/2013   Lower extremity edema 09/02/2013   Essential hypertension 09/02/2013   History of cardiomyopathy 09/02/2013   SVT (supraventricular tachycardia) (HCC) 09/02/2013   Contrast media allergy 03/10/2013   Fibrocystic breast disease    Morbid obesity (HCC) 02/05/2012   Meniere disease    REFERRING DIAG:  M79.18 (ICD-10-CM) - Myalgia,  other site  Z98.890 (ICD-10-CM) - Other specified postprocedural states  M54.12 (ICD-10-CM) - Radiculopathy, cervical region   THERAPY DIAG:  Joint stiffness of spine  Pain in right arm  Muscle weakness (generalized)  PERTINENT HISTORY: See evaluation  PRECAUTIONS: N/A  12/5: Cervical AROM: L rotn.: 70 deg./ R rotn. 51 deg.  (R cervical tightness/ catching).  L UT feels tight.   Cervical flexion 60 deg./ extension 38 deg.  L lat. Flexion 38 deg./ R lat. Flexion 38 deg.  Seated B shoulder flexion/ abduction 4/5 MMT (increase R UT/ shoulder discomfort).  Grip: L 76.7#/ R 52.1#.  Reassessed R hand/finger manual dexterity.    SUBJECTIVE:  03/07/2023  Pt. Reports continued neck/ upper back stiffness with prolonged sitting/ work-related tasks.  Pt. Has no upcoming MD appts. Or massages scheduled.  Pt. Is hoping to get a massage appt. Soon.  Pt. Presents with rounded shoulder posture in standing/ sitting.   PAIN:  Are you having pain? Yes: NPRS scale: 5/10 neck/ R arm (numbness/tingling in pinky/ring finger).   Pain location: Neck Pain description: aching/ stiff/ tight Aggravating factors: prolonged sitting/ activity Relieving factors: stretches/ PT  Today's Treatment: 03/05/2023   Manual Therapy:    Seated/ prone STM to B UT (R>L)/ mid-thoracic paraspinals.     Supine STM/ stretches on bilateral cervical paraspinals, levator  scap, upper trap.  Supine cervical traction/ suboccipital release technique in supine 3x with holds.  Supine L/R cervical rotn. Stretches with holds (pain tolerable range).     Trigger Point Dry Needling (TDN), unbilled Education previously performed with patient regarding potential benefit of TDN. Previously reviewed precautions and risks with patient. Pt provided verbal consent to treatment. In prone using clean technique TDN performed to bilateral upper traps with 4, 0.25 x 40 mm single needle placements (3 on R and 1 on L each side).   Pistoning technique  utilized with periods of static hold. No excessive soreness reported today following intervention.   Application of Biofreeze to neck/ upper back in sitting after tx. Session.      There.ex.:  Supine B shoulder flexion/ horizontal abduction AROM 10x each.  Added 2# dumbbells for horizontal abduction/ pec stretches.  Discussed posture correction/ HEP   PATIENT EDUCATION: Education details: Plan of care Person educated: Patient Education method: Explanation, Demonstration, and Handouts Education comprehension: verbalized understanding and returned demonstration   HOME EXERCISE PROGRAM: [id number].  HEP: [id number]             PT LONG TERM GOAL 1    Title Pt will improve worse cervical pain by at least 3 NPS points to allow increased tolerance during work and home related ADLs.     Baseline NPS: 8/10 current, 7/10 best, 10/10 Worse. 10/20: Worse pain 7/10.  10/12: 5/10 neck pain at worst (improving)- stress related.  4/15: 4/10. 6/13: 5/10    Time 8     Period Weeks     Status Partially Met     Target Date 04/16/2023          PT LONG TERM GOAL 2    Title Pt will improve bilat UE MMT by atleast 1/2 grade to facilitate greater functional abilities with ADLs related to caregiving.     Baseline UE MMT R/L:   Shoulder Flexion: 4/4-  Shoulder abduction: 4/4-  Shoulder ER: 4/4  Shoulder IR: 4/4  Elbow ext: 5/5   Eblow flex: 5/5 10/20: UE MMT R/L:   Shoulder Flexion: 4/4  Shoulder abduction: 4/4 Shoulder ER: 4/4  Shoulder IR: 4/4  Elbow ext: 5/5   Elbow flex: 5/5.  10/12: B shoulder flexion 4/5 MMT, abduction 4/5 MMT biceps/triceps 5/5 MMT.  6/13: B shoulder flexion 4/5 MMT, abduction 4/5 MMT biceps/triceps 5/5 MMT.  IR/ER 4+/5     Time 8     Period Weeks     Status Partially Met     Target Date 04/16/2023          PT LONG TERM GOAL 3    Title Pt will report no B UT muscle tenderness/ trigger points to increase pain-free cervical AROM (all planes) to improve pain-free mobility.      Baseline B  UT trigger points.  Muscle tightness has improved over past several months with manual tx./ massage/ TDN.     Time 8     Period Weeks     Status Partially Met     Target Date 04/16/2023           Plan -     Clinical Impression Statement Continued treatment focus on manual therapy for range of motion, tissue extensibility, and pain control. Good cervical AROM (all planes) with increase R upper cervical tenderness with palpation/ discomfort at end-range.  Moderate UT trigger point noted and pt. Had an episode of sharp pain during TDN to R UT in prone  position.  Pt. Has benefited from consistent trigger point dry needling to manage pain/ exacerbation of pain symptoms with daily/ work-related tasks. Encouraged continuing HEP and follow-up as scheduled.  Pt will continue to benefit from skilled PT to reduce cervical pain with ADL's and work related tasks.       Personal Factors and Comorbidities Comorbidity 2;Profession;Past/Current Experience    Comorbidities Hypertension, Obesity    Examination-Activity Limitations Squat;Locomotion Level    Examination-Participation Restrictions Occupation;Cleaning;Laundry    Stability/Clinical Decision Making Evolving/Moderate complexity    Clinical Decision Making Moderate    Rehab Potential Good    PT Frequency 1-2x/biweekly   PT Duration 8 weeks    PT Treatment/Interventions ADLs/Self Care Home Management;Aquatic Therapy;Biofeedback;Cryotherapy;Electrical Stimulation;Moist Heat;Ultrasound;Functional mobility training;Therapeutic activities;Therapeutic exercise;Balance training;Neuromuscular re-education;Patient/family education;Manual techniques;Scar mobilization;Passive range of motion;Dry needling;Energy conservation;Taping;Gait training;Stair training;DME Instruction;Spinal Manipulations;Joint Manipulations, Canalith repositioning procedure.    PT Next Visit Plan Trigger point release technique/ Hypervolt.  Attempt supine TDN to UT musculature.     PT  Home Exercise Plan [id number].   HEP: [id number]    Consulted and Agree with Plan of Care Patient         Ozell JAYSON Sero, PT, DPT # [phone] Physical Therapist- [facility]  03/07/2023, 01:51 PM

## 2023-03-10 ENCOUNTER — Ambulatory Visit: Payer: BC Managed Care – PPO | Admitting: Physical Therapy

## 2023-03-10 DIAGNOSIS — M2560 Stiffness of unspecified joint, not elsewhere classified: Secondary | ICD-10-CM | POA: Diagnosis not present

## 2023-03-10 DIAGNOSIS — M6281 Muscle weakness (generalized): Secondary | ICD-10-CM | POA: Diagnosis not present

## 2023-03-10 DIAGNOSIS — M79601 Pain in right arm: Secondary | ICD-10-CM | POA: Diagnosis not present

## 2023-03-11 ENCOUNTER — Encounter: Payer: Self-pay | Admitting: Physical Therapy

## 2023-03-11 NOTE — Therapy (Signed)
OUTPATIENT PHYSICAL THERAPY TREATMENT  Patient Name: Amanda Humphrey MRN: [id number] DOB:12/27/1965, 58 y.o., female Today's Date: 03/11/2023  PCP: Sherlene Shams, MD REFERRING PROVIDER: Sherlene Shams, MD   PT End of Session - 03/11/23 0848     Visit Number 101    Number of Visits 108    Date for PT Re-Evaluation 04/16/23    PT Start Time 0732    PT Stop Time 0820    PT Time Calculation (min) 48 min    Activity Tolerance Patient tolerated treatment well    Behavior During Therapy [facility] for tasks assessed/performed             Past Medical History:  Diagnosis Date   Allergy    Arthritis    Chronic kidney disease    stones   Fibrocystic breast disease 2013   GERD (gastroesophageal reflux disease)    Meniere disease    Migraines    Past Surgical History:  Procedure Laterality Date   ABDOMINAL HYSTERECTOMY  2011   laprascopic supracervical, [place]   APPENDECTOMY  1999   BREAST BIOPSY Right 11/2010    fibrocystic disease, [place]   CESAREAN SECTION     4 total   CHOLECYSTECTOMY  2011   SPINE SURGERY  2021   TONSILLECTOMY AND ADENOIDECTOMY  1975   TUBAL LIGATION     Patient Active Problem List   Diagnosis Date Noted   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   H/O of hemilaminectomy 02/15/2019   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   Fatigue 09/23/2017   History of pneumonia 04/30/2016   Grief reaction 04/30/2016   Vitamin D deficiency 02/22/2015   Hyperlipidemia 01/01/2015   Visit for preventive health examination 12/04/2013   Lower extremity edema 09/02/2013   Essential hypertension 09/02/2013   History of cardiomyopathy 09/02/2013   SVT (supraventricular tachycardia) (HCC) 09/02/2013   Contrast media allergy 03/10/2013   Fibrocystic breast disease    Morbid obesity (HCC) 02/05/2012   Meniere disease    REFERRING DIAG:  M79.18 (ICD-10-CM) - Myalgia, other site  Z98.890 (ICD-10-CM) - Other specified postprocedural states  M54.12  (ICD-10-CM) - Radiculopathy, cervical region   THERAPY DIAG:  Joint stiffness of spine  Pain in right arm  Muscle weakness (generalized)  PERTINENT HISTORY: See evaluation  PRECAUTIONS: N/A  12/5: Cervical AROM: L rotn.: 70 deg./ R rotn. 51 deg.  (R cervical tightness/ catching).  L UT feels tight.   Cervical flexion 60 deg./ extension 38 deg.  L lat. Flexion 38 deg./ R lat. Flexion 38 deg.  Seated B shoulder flexion/ abduction 4/5 MMT (increase R UT/ shoulder discomfort).  Grip: L 76.7#/ R 52.1#.  Reassessed R hand/finger manual dexterity.    SUBJECTIVE:  03/11/2023  Pt. Reports continued neck/ upper back stiffness with prolonged sitting/ work-related tasks.  No massages scheduled at this time.  Pt. Presents with rounded shoulder posture in standing/ sitting.   PAIN:  Are you having pain? Yes: NPRS scale: 5/10 neck/ R arm ("numbness/tingling in pinky/ring finger").   Pain location: Neck Pain description: aching/ stiff/ tight Aggravating factors: prolonged sitting/ activity Relieving factors: stretches/ PT  Today's Treatment: 03/11/2023  Manual Therapy:    Seated/ prone STM to B UT (R>L)/ mid-thoracic paraspinals.     Supine STM/ stretches on bilateral cervical paraspinals, levator scap, upper trap.  Supine cervical traction/ suboccipital release technique in supine 3x with holds.  Supine L/R cervical rotn. Stretches with holds (pain tolerable range).     Trigger Point  Dry Needling (TDN), unbilled Education previously performed with patient regarding potential benefit of TDN. Previously reviewed precautions and risks with patient. Pt provided verbal consent to treatment. In prone using clean technique TDN performed to bilateral upper traps with 3, 0.25 x 40 mm single needle placements (3 on R and 1 on L each side).   Pistoning technique utilized with periods of static hold. No excessive soreness reported today following intervention.   Application of Biofreeze to neck/ upper back  in sitting after tx. Session.      There.ex.:  Supine GTB horizontal abduction/ diagonals/ seated "W" scapular ex. 20x each.  Encouraged pt. To complete resisted UE/postural ex. 3x/week.     PATIENT EDUCATION: Education details: Plan of care Person educated: Patient Education method: Explanation, Demonstration, and Handouts Education comprehension: verbalized understanding and returned demonstration   HOME EXERCISE PROGRAM: [id number].  HEP: [id number]             PT LONG TERM GOAL 1    Title Pt will improve worse cervical pain by at least 3 NPS points to allow increased tolerance during work and home related ADLs.     Baseline NPS: 8/10 current, 7/10 best, 10/10 Worse. 10/20: Worse pain 7/10.  10/12: 5/10 neck pain at worst (improving)- stress related.  4/15: 4/10. 6/13: 5/10    Time 8     Period Weeks     Status Partially Met     Target Date 04/16/2023          PT LONG TERM GOAL 2    Title Pt will improve bilat UE MMT by atleast 1/2 grade to facilitate greater functional abilities with ADLs related to caregiving.     Baseline UE MMT R/L:   Shoulder Flexion: 4/4-  Shoulder abduction: 4/4-  Shoulder ER: 4/4  Shoulder IR: 4/4  Elbow ext: 5/5   Eblow flex: 5/5 10/20: UE MMT R/L:   Shoulder Flexion: 4/4  Shoulder abduction: 4/4 Shoulder ER: 4/4  Shoulder IR: 4/4  Elbow ext: 5/5   Elbow flex: 5/5.  10/12: B shoulder flexion 4/5 MMT, abduction 4/5 MMT biceps/triceps 5/5 MMT.  6/13: B shoulder flexion 4/5 MMT, abduction 4/5 MMT biceps/triceps 5/5 MMT.  IR/ER 4+/5     Time 8     Period Weeks     Status Partially Met     Target Date 04/16/2023          PT LONG TERM GOAL 3    Title Pt will report no B UT muscle tenderness/ trigger points to increase pain-free cervical AROM (all planes) to improve pain-free mobility.      Baseline B UT trigger points.  Muscle tightness has improved over past several months with manual tx./ massage/ TDN.     Time 8     Period Weeks     Status Partially Met      Target Date 04/16/2023           Plan -     Clinical Impression Statement Continued treatment focus on manual therapy for range of motion, tissue extensibility, and pain control. Good cervical AROM (all planes) with increase R upper cervical tenderness with palpation/ discomfort at end-range.  Moderate UT trigger point noted and pt. Had an episode of sharp pain during TDN to R UT in prone position.  Pt. Has benefited from consistent trigger point dry needling to manage pain/ exacerbation of pain symptoms with daily/ work-related tasks. Encouraged continuing HEP and follow-up as scheduled.  Pt will continue  to benefit from skilled PT to reduce cervical pain with ADL's and work related tasks.       Personal Factors and Comorbidities Comorbidity 2;Profession;Past/Current Experience    Comorbidities Hypertension, Obesity    Examination-Activity Limitations Squat;Locomotion Level    Examination-Participation Restrictions Occupation;Cleaning;Laundry    Stability/Clinical Decision Making Evolving/Moderate complexity    Clinical Decision Making Moderate    Rehab Potential Good    PT Frequency 1-2x/biweekly   PT Duration 8 weeks    PT Treatment/Interventions ADLs/Self Care Home Management;Aquatic Therapy;Biofeedback;Cryotherapy;Electrical Stimulation;Moist Heat;Ultrasound;Functional mobility training;Therapeutic activities;Therapeutic exercise;Balance training;Neuromuscular re-education;Patient/family education;Manual techniques;Scar mobilization;Passive range of motion;Dry needling;Energy conservation;Taping;Gait training;Stair training;DME Instruction;Spinal Manipulations;Joint Manipulations, Canalith repositioning procedure.    PT Next Visit Plan Trigger point release technique/ Hypervolt.  Attempt supine TDN to UT musculature.     PT Home Exercise Plan [id number].   HEP: [id number]    Consulted and Agree with Plan of Care Patient         Cammie Mcgee, PT, DPT # [phone] Physical Therapist-  [facility]  03/11/2023, 01:51 PM

## 2023-03-12 ENCOUNTER — Encounter: Payer: BC Managed Care – PPO | Admitting: Physical Therapy

## 2023-03-19 ENCOUNTER — Ambulatory Visit: Payer: BC Managed Care – PPO | Admitting: Physical Therapy

## 2023-03-26 ENCOUNTER — Ambulatory Visit: Payer: BC Managed Care – PPO | Admitting: Physical Therapy

## 2023-03-26 DIAGNOSIS — M2560 Stiffness of unspecified joint, not elsewhere classified: Secondary | ICD-10-CM

## 2023-03-26 DIAGNOSIS — M6281 Muscle weakness (generalized): Secondary | ICD-10-CM | POA: Diagnosis not present

## 2023-03-26 DIAGNOSIS — M79601 Pain in right arm: Secondary | ICD-10-CM

## 2023-03-27 NOTE — Therapy (Signed)
 OUTPATIENT PHYSICAL THERAPY TREATMENT  Patient Name: Amanda Humphrey MRN: [id number] DOB:1965/04/23, 58 y.o., female Today's Date: 03/27/2023  PCP: Sherlene Shams, MD REFERRING PROVIDER: Sherlene Shams, MD   PT End of Session - 03/27/23 1913     Visit Number 102    Number of Visits 108    Date for PT Re-Evaluation 04/16/23    PT Start Time 1119    PT Stop Time 1201    PT Time Calculation (min) 42 min    Activity Tolerance Patient tolerated treatment well    Behavior During Therapy [facility] for tasks assessed/performed             Past Medical History:  Diagnosis Date   Allergy    Arthritis    Chronic kidney disease    stones   Fibrocystic breast disease 2013   GERD (gastroesophageal reflux disease)    Meniere disease    Migraines    Past Surgical History:  Procedure Laterality Date   ABDOMINAL HYSTERECTOMY  2011   laprascopic supracervical, [place]   APPENDECTOMY  1999   BREAST BIOPSY Right 11/2010    fibrocystic disease, [place]   CESAREAN SECTION     4 total   CHOLECYSTECTOMY  2011   SPINE SURGERY  2021   TONSILLECTOMY AND ADENOIDECTOMY  1975   TUBAL LIGATION     Patient Active Problem List   Diagnosis Date Noted   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   H/O of hemilaminectomy 02/15/2019   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   Fatigue 09/23/2017   History of pneumonia 04/30/2016   Grief reaction 04/30/2016   Vitamin D deficiency 02/22/2015   Hyperlipidemia 01/01/2015   Visit for preventive health examination 12/04/2013   Lower extremity edema 09/02/2013   Essential hypertension 09/02/2013   History of cardiomyopathy 09/02/2013   SVT (supraventricular tachycardia) (HCC) 09/02/2013   Contrast media allergy 03/10/2013   Fibrocystic breast disease    Morbid obesity (HCC) 02/05/2012   Meniere disease    REFERRING DIAG:  M79.18 (ICD-10-CM) - Myalgia, other site  Z98.890 (ICD-10-CM) - Other specified postprocedural states  M54.12  (ICD-10-CM) - Radiculopathy, cervical region   THERAPY DIAG:  Joint stiffness of spine  Pain in right arm  Muscle weakness (generalized)  PERTINENT HISTORY: See evaluation  PRECAUTIONS: N/A  12/5: Cervical AROM: L rotn.: 70 deg./ R rotn. 51 deg.  (R cervical tightness/ catching).  L UT feels tight.   Cervical flexion 60 deg./ extension 38 deg.  L lat. Flexion 38 deg./ R lat. Flexion 38 deg.  Seated B shoulder flexion/ abduction 4/5 MMT (increase R UT/ shoulder discomfort).  Grip: L 76.7#/ R 52.1#.  Reassessed R hand/finger manual dexterity.    SUBJECTIVE:  03/27/2023  Pt. Reports continued neck/ upper back stiffness with prolonged sitting/ work-related tasks.  Pt. Presents with rounded shoulder posture in standing/ sitting.  Pt. Reports no new complaints and active with work/ daily activities.    PAIN:  Are you having pain? Yes: NPRS scale: 5/10 neck/ R arm ("numbness/tingling in pinky/ring finger").   Pain location: Neck Pain description: aching/ stiff/ tight Aggravating factors: prolonged sitting/ activity Relieving factors: stretches/ PT  Today's Treatment: 03/27/2023  Manual Therapy:    Seated/ prone STM to B UT (R>L)/ mid-thoracic paraspinals.     Supine STM/ stretches on bilateral cervical paraspinals, levator scap, upper trap.  Supine cervical traction/ suboccipital release technique in supine 3x with holds.  Supine L/R cervical rotn. Stretches with holds (pain tolerable range).  Supine L/R shoulder flexion/ horizontal abduction with OP (R UE discomfort at end-range).    Trigger Point Dry Needling (TDN), unbilled Education previously performed with patient regarding potential benefit of TDN. Previously reviewed precautions and risks with patient. Pt provided verbal consent to treatment. In prone using clean technique TDN performed to bilateral upper traps with 5, 0.25 x 40 mm single needle placements (3 on R and 1 on L each side).   Pistoning technique utilized with periods  of static hold. No excessive soreness reported today following intervention.   Application of Biofreeze to neck/ upper back in sitting after tx. Session.       Not today Supine GTB horizontal abduction/ diagonals/ seated "W" scapular ex. 20x each.  Encouraged pt. To complete resisted UE/postural ex. 3x/week.     PATIENT EDUCATION: Education details: Plan of care Person educated: Patient Education method: Explanation, Demonstration, and Handouts Education comprehension: verbalized understanding and returned demonstration   HOME EXERCISE PROGRAM: [id number].  HEP: [id number]             PT LONG TERM GOAL 1    Title Pt will improve worse cervical pain by at least 3 NPS points to allow increased tolerance during work and home related ADLs.     Baseline NPS: 8/10 current, 7/10 best, 10/10 Worse. 10/20: Worse pain 7/10.  10/12: 5/10 neck pain at worst (improving)- stress related.  4/15: 4/10. 6/13: 5/10    Time 8     Period Weeks     Status Partially Met     Target Date 04/16/2023          PT LONG TERM GOAL 2    Title Pt will improve bilat UE MMT by atleast 1/2 grade to facilitate greater functional abilities with ADLs related to caregiving.     Baseline UE MMT R/L:   Shoulder Flexion: 4/4-  Shoulder abduction: 4/4-  Shoulder ER: 4/4  Shoulder IR: 4/4  Elbow ext: 5/5   Eblow flex: 5/5 10/20: UE MMT R/L:   Shoulder Flexion: 4/4  Shoulder abduction: 4/4 Shoulder ER: 4/4  Shoulder IR: 4/4  Elbow ext: 5/5   Elbow flex: 5/5.  10/12: B shoulder flexion 4/5 MMT, abduction 4/5 MMT biceps/triceps 5/5 MMT.  6/13: B shoulder flexion 4/5 MMT, abduction 4/5 MMT biceps/triceps 5/5 MMT.  IR/ER 4+/5     Time 8     Period Weeks     Status Partially Met     Target Date 04/16/2023          PT LONG TERM GOAL 3    Title Pt will report no B UT muscle tenderness/ trigger points to increase pain-free cervical AROM (all planes) to improve pain-free mobility.      Baseline B UT trigger points.  Muscle tightness  has improved over past several months with manual tx./ massage/ TDN.     Time 8     Period Weeks     Status Partially Met     Target Date 04/16/2023           Plan -     Clinical Impression Statement Continued treatment focus on manual therapy for range of motion, tissue extensibility, and pain control. Good cervical AROM (all planes) with increase R upper cervical tenderness with palpation/ discomfort at end-range.  Moderate R UT trigger point noted and pt. Had an episode of sharp pain during TDN to R UT in prone position.  Pt. Has benefited from consistent trigger point dry needling to manage  pain/ exacerbation of pain symptoms with daily/ work-related tasks. Encouraged continuing HEP and follow-up as scheduled.  Pt will continue to benefit from skilled PT to reduce cervical pain with ADL's and work related tasks.       Personal Factors and Comorbidities Comorbidity 2;Profession;Past/Current Experience    Comorbidities Hypertension, Obesity    Examination-Activity Limitations Squat;Locomotion Level    Examination-Participation Restrictions Occupation;Cleaning;Laundry    Stability/Clinical Decision Making Evolving/Moderate complexity    Clinical Decision Making Moderate    Rehab Potential Good    PT Frequency 1-2x/biweekly   PT Duration 8 weeks    PT Treatment/Interventions ADLs/Self Care Home Management;Aquatic Therapy;Biofeedback;Cryotherapy;Electrical Stimulation;Moist Heat;Ultrasound;Functional mobility training;Therapeutic activities;Therapeutic exercise;Balance training;Neuromuscular re-education;Patient/family education;Manual techniques;Scar mobilization;Passive range of motion;Dry needling;Energy conservation;Taping;Gait training;Stair training;DME Instruction;Spinal Manipulations;Joint Manipulations, Canalith repositioning procedure.    PT Next Visit Plan Trigger point release technique/ Hypervolt.  Attempt supine TDN to UT musculature.     PT Home Exercise Plan [id number].   HEP:  [id number]    Consulted and Agree with Plan of Care Patient         Cammie Mcgee, PT, DPT # [phone] Physical Therapist- [facility]  03/27/2023, 01:51 PM

## 2023-03-30 ENCOUNTER — Encounter: Payer: BC Managed Care – PPO | Admitting: Internal Medicine

## 2023-03-30 ENCOUNTER — Encounter: Payer: Self-pay | Admitting: Internal Medicine

## 2023-03-31 MED ORDER — METHOCARBAMOL 500 MG PO TABS: 500.0000 mg | ORAL_TABLET | Freq: Two times a day (BID) | ORAL | 3 refills | Status: DC

## 2023-04-02 ENCOUNTER — Ambulatory Visit: Payer: BC Managed Care – PPO | Attending: Internal Medicine | Admitting: Physical Therapy

## 2023-04-02 DIAGNOSIS — M79601 Pain in right arm: Secondary | ICD-10-CM | POA: Insufficient documentation

## 2023-04-02 DIAGNOSIS — M6281 Muscle weakness (generalized): Secondary | ICD-10-CM | POA: Insufficient documentation

## 2023-04-02 DIAGNOSIS — M2560 Stiffness of unspecified joint, not elsewhere classified: Secondary | ICD-10-CM | POA: Insufficient documentation

## 2023-04-03 ENCOUNTER — Encounter: Payer: Self-pay | Admitting: Physical Therapy

## 2023-04-03 NOTE — Therapy (Signed)
 OUTPATIENT PHYSICAL THERAPY TREATMENT  Patient Name: Amanda Humphrey MRN: [id number] DOB:08-18-1965, 58 y.o., female Today's Date: 04/03/2023  PCP: Sherlene Shams, MD REFERRING PROVIDER: Sherlene Shams, MD   PT End of Session - 04/03/23 1857     Visit Number 103    Number of Visits 108    Date for PT Re-Evaluation 04/16/23    PT Start Time 0814    PT Stop Time 0901    PT Time Calculation (min) 47 min    Activity Tolerance Patient tolerated treatment well    Behavior During Therapy [facility] for tasks assessed/performed             Past Medical History:  Diagnosis Date   Allergy    Arthritis    Chronic kidney disease    stones   Fibrocystic breast disease 2013   GERD (gastroesophageal reflux disease)    Meniere disease    Migraines    Past Surgical History:  Procedure Laterality Date   ABDOMINAL HYSTERECTOMY  2011   laprascopic supracervical, [place]   APPENDECTOMY  1999   BREAST BIOPSY Right 11/2010    fibrocystic disease, [place]   CESAREAN SECTION     4 total   CHOLECYSTECTOMY  2011   SPINE SURGERY  2021   TONSILLECTOMY AND ADENOIDECTOMY  1975   TUBAL LIGATION     Patient Active Problem List   Diagnosis Date Noted   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   H/O of hemilaminectomy 02/15/2019   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   Fatigue 09/23/2017   History of pneumonia 04/30/2016   Grief reaction 04/30/2016   Vitamin D deficiency 02/22/2015   Hyperlipidemia 01/01/2015   Visit for preventive health examination 12/04/2013   Lower extremity edema 09/02/2013   Essential hypertension 09/02/2013   History of cardiomyopathy 09/02/2013   SVT (supraventricular tachycardia) (HCC) 09/02/2013   Contrast media allergy 03/10/2013   Fibrocystic breast disease    Morbid obesity (HCC) 02/05/2012   Meniere disease    REFERRING DIAG:  M79.18 (ICD-10-CM) - Myalgia, other site  Z98.890 (ICD-10-CM) - Other specified postprocedural states  M54.12  (ICD-10-CM) - Radiculopathy, cervical region   THERAPY DIAG:  Joint stiffness of spine  Pain in right arm  Muscle weakness (generalized)  PERTINENT HISTORY: See evaluation  PRECAUTIONS: N/A  12/5: Cervical AROM: L rotn.: 70 deg./ R rotn. 51 deg.  (R cervical tightness/ catching).  L UT feels tight.   Cervical flexion 60 deg./ extension 38 deg.  L lat. Flexion 38 deg./ R lat. Flexion 38 deg.  Seated B shoulder flexion/ abduction 4/5 MMT (increase R UT/ shoulder discomfort).  Grip: L 76.7#/ R 52.1#.  Reassessed R hand/finger manual dexterity.    SUBJECTIVE:  04/03/2023  Pt. Presents with rounded shoulder posture in standing/ sitting.  Pt. Reports no new complaints and remains active with work/ daily activities.  Pt. Has trip to [place] scheduled for next week for a few days.    PAIN:  Are you having pain? Yes: NPRS scale: 5/10 neck.  No reports of R arm ("numbness/tingling in pinky/ring finger").   Pain location: Neck Pain description: aching/ stiff/ tight Aggravating factors: prolonged sitting/ activity Relieving factors: stretches/ PT  Today's Treatment: 04/03/2023  There.ex:  Supine 2# shoulder press-ups/ horizontal abduction/ adduction/ alt. Shoulder flexoin 20x.    Manual Therapy:    Seated/ prone STM to B UT (R>L)/ mid-thoracic paraspinals.     Supine STM/ stretches on bilateral cervical paraspinals, levator scap, upper trap.  Supine cervical traction/ suboccipital release technique in supine 3x with holds.  Supine L/R cervical rotn. Stretches with holds (pain tolerable range).   Supine L/R shoulder flexion/ horizontal abduction with OP (R UE discomfort at end-range).    Trigger Point Dry Needling (TDN), unbilled Education previously performed with patient regarding potential benefit of TDN. Previously reviewed precautions and risks with patient. Pt provided verbal consent to treatment. In prone using clean technique TDN performed to bilateral upper traps with 4, 0.25 x 40 mm  single needle placements (3 on R and 1 on L each side).   Pistoning technique utilized with periods of static hold. No excessive soreness reported today following intervention.   Application of Biofreeze to neck/ upper back in sitting after tx. Session.       Not today Supine GTB horizontal abduction/ diagonals/ seated "W" scapular ex. 20x each.  Encouraged pt. To complete resisted UE/postural ex. 3x/week.     PATIENT EDUCATION: Education details: Plan of care Person educated: Patient Education method: Explanation, Demonstration, and Handouts Education comprehension: verbalized understanding and returned demonstration   HOME EXERCISE PROGRAM: [id number].  HEP: [id number]             PT LONG TERM GOAL 1    Title Pt will improve worse cervical pain by at least 3 NPS points to allow increased tolerance during work and home related ADLs.     Baseline NPS: 8/10 current, 7/10 best, 10/10 Worse. 10/20: Worse pain 7/10.  10/12: 5/10 neck pain at worst (improving)- stress related.  4/15: 4/10. 6/13: 5/10    Time 8     Period Weeks     Status Partially Met     Target Date 04/16/2023          PT LONG TERM GOAL 2    Title Pt will improve bilat UE MMT by atleast 1/2 grade to facilitate greater functional abilities with ADLs related to caregiving.     Baseline UE MMT R/L:   Shoulder Flexion: 4/4-  Shoulder abduction: 4/4-  Shoulder ER: 4/4  Shoulder IR: 4/4  Elbow ext: 5/5   Eblow flex: 5/5 10/20: UE MMT R/L:   Shoulder Flexion: 4/4  Shoulder abduction: 4/4 Shoulder ER: 4/4  Shoulder IR: 4/4  Elbow ext: 5/5   Elbow flex: 5/5.  10/12: B shoulder flexion 4/5 MMT, abduction 4/5 MMT biceps/triceps 5/5 MMT.  6/13: B shoulder flexion 4/5 MMT, abduction 4/5 MMT biceps/triceps 5/5 MMT.  IR/ER 4+/5     Time 8     Period Weeks     Status Partially Met     Target Date 04/16/2023          PT LONG TERM GOAL 3    Title Pt will report no B UT muscle tenderness/ trigger points to increase pain-free cervical AROM  (all planes) to improve pain-free mobility.      Baseline B UT trigger points.  Muscle tightness has improved over past several months with manual tx./ massage/ TDN.     Time 8     Period Weeks     Status Partially Met     Target Date 04/16/2023           Plan -     Clinical Impression Statement Continued treatment focus on manual therapy for range of motion, tissue extensibility, and pain control. Good cervical AROM (all planes) with increase R upper cervical tenderness with palpation/ discomfort at end-range.  Moderate R UT trigger point noted and pt. Had an  episode of sharp pain during TDN to R UT in prone position.  Pt. Has benefited from consistent trigger point dry needling to manage pain/ exacerbation of pain symptoms with daily/ work-related tasks. Encouraged continuing HEP and follow-up as scheduled.  Pt will continue to benefit from skilled PT to reduce cervical pain with ADL's and work related tasks.       Personal Factors and Comorbidities Comorbidity 2;Profession;Past/Current Experience    Comorbidities Hypertension, Obesity    Examination-Activity Limitations Squat;Locomotion Level    Examination-Participation Restrictions Occupation;Cleaning;Laundry    Stability/Clinical Decision Making Evolving/Moderate complexity    Clinical Decision Making Moderate    Rehab Potential Good    PT Frequency 1-2x/biweekly   PT Duration 8 weeks    PT Treatment/Interventions ADLs/Self Care Home Management;Aquatic Therapy;Biofeedback;Cryotherapy;Electrical Stimulation;Moist Heat;Ultrasound;Functional mobility training;Therapeutic activities;Therapeutic exercise;Balance training;Neuromuscular re-education;Patient/family education;Manual techniques;Scar mobilization;Passive range of motion;Dry needling;Energy conservation;Taping;Gait training;Stair training;DME Instruction;Spinal Manipulations;Joint Manipulations, Canalith repositioning procedure.    PT Next Visit Plan Trigger point release  technique/ Hypervolt.  Attempt supine TDN to UT musculature.     PT Home Exercise Plan [id number].   HEP: [id number]    Consulted and Agree with Plan of Care Patient         Cammie Mcgee, PT, DPT # [phone] Physical Therapist- [facility]  04/03/2023, 07:01 PM

## 2023-04-09 ENCOUNTER — Ambulatory Visit: Payer: BC Managed Care – PPO | Admitting: Physical Therapy

## 2023-05-18 ENCOUNTER — Ambulatory Visit
Admission: RE | Admit: 2023-05-18 | Discharge: 2023-05-18 | Disposition: A | Discharge status: Home / Self Care | Source: Ambulatory Visit | Attending: Emergency Medicine | Admitting: Emergency Medicine

## 2023-05-18 VITALS — BP 138/83 | HR 82 | Temp 98.5°F | Resp 16

## 2023-05-18 DIAGNOSIS — H6991 Unspecified Eustachian tube disorder, right ear: Secondary | ICD-10-CM | POA: Diagnosis not present

## 2023-05-18 DIAGNOSIS — H6501 Acute serous otitis media, right ear: Secondary | ICD-10-CM

## 2023-05-18 DIAGNOSIS — H9201 Otalgia, right ear: Secondary | ICD-10-CM

## 2023-05-18 MED ORDER — CEFDINIR 300 MG PO CAPS: 300.0000 mg | ORAL_CAPSULE | Freq: Two times a day (BID) | ORAL | 0 refills | Status: DC

## 2023-05-18 MED ORDER — FLUTICASONE PROPIONATE 50 MCG/ACT NA SUSP: 2.0000 | Freq: Every day | NASAL | 0 refills | Status: DC

## 2023-05-18 MED ORDER — PREDNISONE 20 MG PO TABS: 40.0000 mg | ORAL_TABLET | Freq: Every day | ORAL | 0 refills | Status: AC

## 2023-05-18 NOTE — ED Provider Notes (Signed)
 HPI  SUBJECTIVE:  Amanda Humphrey is a 58 y.o. female who presents with sharp and dull right ear pain starting this morning.  She has been dealing with allergy symptoms with nasal congestion, rhinorrhea recently.  States her ear "felt wet" this morning, but denies frank otorrhea.  She reports popping swallowing.  No fevers, foreign body insertion, vertigo, tinnitus, recent swimming, facial pain.  She tried Sudafed, ibuprofen, warm compresses, Flonase  and Claritin.  The ibuprofen, Sudafed and warm compresses helps.  Last dose of ibuprofen was within 6 hours of evaluation.  No aggravating factors. She has a past medical history of SVT, cardiomyopathy, Mnire's disease.  PCP: [facility]  Past Medical History:  Diagnosis Date   Allergy    Arthritis    Chronic kidney disease    stones   Fibrocystic breast disease 2013   GERD (gastroesophageal reflux disease)    Meniere disease    Migraines     Past Surgical History:  Procedure Laterality Date   ABDOMINAL HYSTERECTOMY  2011   laprascopic supracervical, [place]   APPENDECTOMY  1999   BREAST BIOPSY Right 11/2010    fibrocystic disease, [place]   CESAREAN SECTION     4 total   CHOLECYSTECTOMY  2011   SPINE SURGERY  2021   TONSILLECTOMY AND ADENOIDECTOMY  1975   TUBAL LIGATION      Family History  Problem Relation Age of Onset   Hypertension Mother    Cancer Mother        ovarian   Arthritis Mother    ADD / ADHD Mother    Hyperlipidemia Father    Hypertension Father    Diabetes Father    Stroke Father    Alzheimer's disease Maternal Grandmother    Heart disease Maternal Grandmother    Breast cancer Maternal Grandmother    Alzheimer's disease Paternal Grandmother    Breast cancer Paternal Grandmother     Social History   Tobacco Use   Smoking status: Never   Smokeless tobacco: Never  Vaping Use   Vaping status: Never Used  Substance Use Topics   Alcohol use: Yes    Alcohol/week: 1.0 standard drink of alcohol     Types: 1 Glasses of wine per week    Comment: social   Drug use: No    No current facility-administered medications for this encounter.  Current Outpatient Medications:    cefdinir  (OMNICEF ) 300 MG capsule, Take 1 capsule (300 mg total) by mouth 2 (two) times daily for 7 days., Disp: 14 capsule, Rfl: 0   fluticasone  (FLONASE ) 50 MCG/ACT nasal spray, Place 2 sprays into both nostrils daily., Disp: 16 g, Rfl: 0   predniSONE  (DELTASONE ) 20 MG tablet, Take 2 tablets (40 mg total) by mouth daily with breakfast for 5 days., Disp: 10 tablet, Rfl: 0   albuterol  (VENTOLIN  HFA) 108 (90 Base) MCG/ACT inhaler, Inhale 1-2 puffs into the lungs every 6 (six) hours as needed for wheezing or shortness of breath., Disp: 1 g, Rfl: 0   amphetamine-dextroamphetamine (ADDERALL) 20 MG tablet, Take 20 mg by mouth every evening. , Disp: , Rfl:    carvedilol  (COREG ) 6.25 MG tablet, TAKE 1 TABLET TWICE A DAY (PLEASE CALL OUR OFFICE AND SCHEDULE YEARLY APPOINTMENT FOR FURTHER REFILLS), Disp: 180 tablet, Rfl: 3   cyclobenzaprine  (FLEXERIL ) 10 MG tablet, Take 1 tablet (10 mg total) by mouth at bedtime., Disp: 90 tablet, Rfl: 1   Evolocumab  (REPATHA  SURECLICK) 140 MG/ML SOAJ, Inject 140 mg into the skin  every 14 (fourteen) days., Disp: 6 mL, Rfl: 3   ezetimibe  (ZETIA ) 10 MG tablet, Take 1 tablet (10 mg total) by mouth daily., Disp: 90 tablet, Rfl: 3   furosemide  (LASIX ) 20 MG tablet, TAKE 1 TABLET DAILY AS NEEDED, Disp: 90 tablet, Rfl: 3   hydrochlorothiazide  (HYDRODIURIL ) 25 MG tablet, TAKE 1 TABLET DAILY, Disp: 90 tablet, Rfl: 3   ipratropium (ATROVENT ) 0.06 % nasal spray, Place 2 sprays into both nostrils 4 (four) times daily., Disp: 15 mL, Rfl: 12   methocarbamol  (ROBAXIN ) 500 MG tablet, Take 1 tablet (500 mg total) by mouth 2 (two) times daily., Disp: 270 tablet, Rfl: 3   omeprazole  (PRILOSEC) 40 MG capsule, TAKE 1 CAPSULE DAILY, Disp: 90 capsule, Rfl: 3  Allergies  Allergen Reactions   Bee Venom Anaphylaxis    Codeine     Rash, Throat swelling,    Hydrocodone Anaphylaxis   Tramadol     Itching, facial swelling   Oxycodone Rash     ROS  As noted in HPI.   Physical Exam  BP 138/83 (BP Location: Right Arm)   Pulse 82   Temp 98.5 F (36.9 C) (Oral)   Resp 16   SpO2 95%   Constitutional: Well developed, well nourished, no acute distress Eyes:  EOMI, conjunctiva normal bilaterally HENT: Normocephalic, atraumatic,mucus membranes moist.  Right external ear, EAC normal.  TM normal, intact with serous fluid behind TM.  No pain with traction on pinna, palpation of mastoid.  Mild tenderness at the tragus.  Mild right TMJ tenderness.  No crepitus.  Hearing slightly decreased right ear compared to left.  Right external ear, EAC, TM normal.  Mild nasal congestion. Neck: No cervical lymphadenopathy Respiratory: Normal inspiratory effort Cardiovascular: Normal rate GI: nondistended skin: No rash, skin intact Musculoskeletal: no deformities Neurologic: Alert & oriented x 3, no focal neuro deficits Psychiatric: Speech and behavior appropriate   ED Course   Medications - No data to display  No orders of the defined types were placed in this encounter.   No results found for this or any previous visit (from the past 24 hours). No results found.  ED Clinical Impression  1. Non-recurrent acute serous otitis media of right ear   2. Otalgia of right ear   3. Dysfunction of right eustachian tube      ED Assessment/Plan     Presents with a serous otitis, most likely from eustachian tube dysfunction/allergies.  Will have her continue Flonase , Claritin, Afrin for 3 days, prednisone  40 mg for 5 days.  Wait-and-see prescription of Omnicef .  Follow-up with PCP as needed.   600 mg of ibuprofen, 1000 mg of Tylenol  3-4 times a day as needed for pain  Discussed labs, imaging, MDM, treatment plan, and plan for follow-up with patient. patient agrees with plan.   Meds ordered this encounter   Medications   cefdinir  (OMNICEF ) 300 MG capsule    Sig: Take 1 capsule (300 mg total) by mouth 2 (two) times daily for 7 days.    Dispense:  14 capsule    Refill:  0   predniSONE  (DELTASONE ) 20 MG tablet    Sig: Take 2 tablets (40 mg total) by mouth daily with breakfast for 5 days.    Dispense:  10 tablet    Refill:  0   fluticasone  (FLONASE ) 50 MCG/ACT nasal spray    Sig: Place 2 sprays into both nostrils daily.    Dispense:  16 g    Refill:  0      *  This clinic note was created using Scientist, clinical (histocompatibility and immunogenetics). Therefore, there may be occasional mistakes despite careful proofreading.  ?    Ethlyn Herd, MD 05/19/23 [phone]

## 2023-05-18 NOTE — Discharge Instructions (Addendum)
 Continue Flonase  and Claritin.  Afrin on the right side for no more than 3 days.  Prednisone  40 mg for 5 days.  Wait-and-see prescription of Omnicef .  600 mg of ibuprofen, 1000 mg of Tylenol  3-4 times a day as needed for pain.

## 2023-05-18 NOTE — ED Triage Notes (Signed)
 Pt presents with right ear pain that started this morning. She reports taking tylenol  and sudafed that has helped with the pain.

## 2023-05-20 ENCOUNTER — Encounter: Payer: Self-pay | Admitting: Internal Medicine

## 2023-05-20 ENCOUNTER — Ambulatory Visit: Payer: BC Managed Care – PPO | Admitting: Internal Medicine

## 2023-05-20 VITALS — BP 126/80 | HR 88 | Ht 67.5 in | Wt 248.8 lb

## 2023-05-20 DIAGNOSIS — R5383 Other fatigue: Secondary | ICD-10-CM | POA: Diagnosis not present

## 2023-05-20 DIAGNOSIS — Z1231 Encounter for screening mammogram for malignant neoplasm of breast: Secondary | ICD-10-CM

## 2023-05-20 DIAGNOSIS — E782 Mixed hyperlipidemia: Secondary | ICD-10-CM | POA: Diagnosis not present

## 2023-05-20 DIAGNOSIS — I10 Essential (primary) hypertension: Secondary | ICD-10-CM

## 2023-05-20 DIAGNOSIS — I471 Supraventricular tachycardia, unspecified: Secondary | ICD-10-CM

## 2023-05-20 DIAGNOSIS — Z0000 Encounter for general adult medical examination without abnormal findings: Secondary | ICD-10-CM

## 2023-05-20 DIAGNOSIS — T466X5A Adverse effect of antihyperlipidemic and antiarteriosclerotic drugs, initial encounter: Secondary | ICD-10-CM

## 2023-05-20 DIAGNOSIS — G720 Drug-induced myopathy: Secondary | ICD-10-CM | POA: Insufficient documentation

## 2023-05-20 MED ORDER — CYCLOBENZAPRINE HCL 10 MG PO TABS: 10.0000 mg | ORAL_TABLET | Freq: Every day | ORAL | 3 refills | Status: AC

## 2023-05-20 MED ORDER — METHOCARBAMOL 500 MG PO TABS: 500.0000 mg | ORAL_TABLET | Freq: Three times a day (TID) | ORAL | 3 refills | Status: AC | PRN

## 2023-05-20 NOTE — Progress Notes (Unsigned)
 Patient ID: CAROLEANN CASLER, female    DOB: [birth year]  Age: 58 y.o. MRN: [id number]  The patient is here for annual preventive examination and management of other chronic and acute problems.   The risk factors are reflected in the social history.   The roster of all physicians providing medical care to patient - is listed in the Snapshot section of the chart.   Activities of daily living:  The patient is 100% independent in all ADLs: dressing, toileting, feeding as well as independent mobility   Home safety : The patient has smoke detectors in the home. They wear seatbelts.  There are no unsecured firearms at home. There is no violence in the home.    There is no risks for hepatitis, STDs or HIV. There is no   history of blood transfusion. They have no travel history to infectious disease endemic areas of the world.   The patient has seen their dentist in the last six month. They have seen their eye doctor in the last year. The patinet  denies slight hearing difficulty with regard to whispered voices and some television programs.  They have deferred audiologic testing in the last year.  They do not  have excessive sun exposure. Discussed the need for sun protection: hats, long sleeves and use of sunscreen if there is significant sun exposure.    Diet: the importance of a healthy diet is discussed. They do have a healthy diet.   The benefits of regular aerobic exercise were discussed. The patient walks 3 days per week  for  60 minutes.    Depression screen: there are no signs or vegative symptoms of depression- irritability, change in appetite, anhedonia, sadness/tearfullness.   The following portions of the patient's history were reviewed and updated as appropriate: allergies, current medications, past family history, past medical history,  past surgical history, past social history  and problem list.   Visual acuity was not assessed per patient preference since the patient has regular follow  up with an  ophthalmologist. Hearing and body mass index were assessed and reviewed.    During the course of the visit the patient was educated and counseled about appropriate screening and preventive services including : fall prevention , diabetes screening, nutrition counseling, colorectal cancer screening, and recommended immunizations.    Chief Complaint:  PAP smear 2024 by Adela Ades  Obesity:  she has been Taking compounded   semaglutide    via  online source ([facility])   since feb 15  .  Starting weight was  260  now 248.  Goes to 0.3 mg dose on Saturday   cost $199/month [facility], has 24/7 nurse hotline.   HLD:   statin intolerant due to myalgias;  taking zetia .  waiting for insurance to approve Repatha    ADD managed by her gynecologist  dr Adela Ades, with adderall 20 mg daily ,  extra dose if evening meeting      Review of Symptoms  Patient denies headache, fevers, malaise, unintentional weight loss, skin rash, eye pain, sinus congestion and sinus pain, sore throat, dysphagia,  hemoptysis , cough, dyspnea, wheezing, chest pain, palpitations, orthopnea, edema, abdominal pain, nausea, melena, diarrhea, constipation, flank pain, dysuria, hematuria, urinary  Frequency, nocturia, numbness, tingling, seizures,  Focal weakness, Loss of consciousness,  Tremor, insomnia, depression, anxiety, and suicidal ideation.    Physical Exam:  Pulse 88   Ht 5' 7.5" (1.715 m)   Wt 248 lb 12.8 oz (112.9 kg)   SpO2 98%  BMI 38.39 kg/m    Physical Exam Vitals reviewed.  Constitutional:      General: She is not in acute distress.    Appearance: Normal appearance. She is normal weight. She is not ill-appearing, toxic-appearing or diaphoretic.  HENT:     Head: Normocephalic.  Eyes:     General: No scleral icterus.       Right eye: No discharge.        Left eye: No discharge.     Conjunctiva/sclera: Conjunctivae normal.  Cardiovascular:     Rate and Rhythm: Normal rate and regular rhythm.      Heart sounds: Normal heart sounds.  Pulmonary:     Effort: Pulmonary effort is normal. No respiratory distress.     Breath sounds: Normal breath sounds.  Musculoskeletal:        General: Normal range of motion.  Skin:    General: Skin is warm and dry.  Neurological:     General: No focal deficit present.     Mental Status: She is alert and oriented to person, place, and time. Mental status is at baseline.  Psychiatric:        Mood and Affect: Mood normal.        Behavior: Behavior normal.        Thought Content: Thought content normal.        Judgment: Judgment normal.   Assessment and Plan: Encounter for screening mammogram for malignant neoplasm of breast  Essential hypertension  Visit for preventive health examination  Mixed hyperlipidemia  Fatigue, unspecified type    No follow-ups on file.  Thersia Flax, MD

## 2023-05-20 NOTE — Assessment & Plan Note (Signed)
 Using zetia ,  needs repatha 

## 2023-05-20 NOTE — Assessment & Plan Note (Signed)

## 2023-05-20 NOTE — Patient Instructions (Signed)
 YOUR MAMMOGRAM Ihas been ordered , PLEASE CALL AND GET THIS SCHEDULED! [facility] - call [phone]  Tony Frederickson does  the scheduling for [facility] as well

## 2023-05-21 LAB — CBC WITH DIFFERENTIAL/PLATELET
Basophils Absolute: 0.1 K/uL (ref 0.0–0.1)
Basophils Relative: 1.2 % (ref 0.0–3.0)
Eosinophils Absolute: 0.2 K/uL (ref 0.0–0.7)
Eosinophils Relative: 2.2 % (ref 0.0–5.0)
HCT: 42.2 % (ref 36.0–46.0)
Hemoglobin: 14.2 g/dL (ref 12.0–15.0)
Lymphocytes Relative: 30.9 % (ref 12.0–46.0)
Lymphs Abs: 2.6 K/uL (ref 0.7–4.0)
MCHC: 33.7 g/dL (ref 30.0–36.0)
MCV: 89.3 fl (ref 78.0–100.0)
Monocytes Absolute: 0.3 K/uL (ref 0.1–1.0)
Monocytes Relative: 3.5 % (ref 3.0–12.0)
Neutro Abs: 5.2 K/uL (ref 1.4–7.7)
Neutrophils Relative %: 62.2 % (ref 43.0–77.0)
Platelets: 363 K/uL (ref 150.0–400.0)
RBC: 4.73 Mil/uL (ref 3.87–5.11)
RDW: 13.6 % (ref 11.5–15.5)
WBC: 8.3 K/uL (ref 4.0–10.5)

## 2023-05-21 LAB — COMPREHENSIVE METABOLIC PANEL WITH GFR
ALT: 16 U/L (ref 0–35)
AST: 14 U/L (ref 0–37)
Albumin: 4.6 g/dL (ref 3.5–5.2)
Alkaline Phosphatase: 79 U/L (ref 39–117)
BUN: 14 mg/dL (ref 6–23)
CO2: 30 meq/L (ref 19–32)
Calcium: 10.3 mg/dL (ref 8.4–10.5)
Chloride: 98 meq/L (ref 96–112)
Creatinine, Ser: 1.01 mg/dL (ref 0.40–1.20)
GFR: 61.27 mL/min (ref >60.00)
Glucose, Bld: 94 mg/dL (ref 70–99)
Potassium: 4 meq/L (ref 3.5–5.1)
Sodium: 139 meq/L (ref 135–145)
Total Bilirubin: 0.7 mg/dL (ref 0.2–1.2)
Total Protein: 7.1 g/dL (ref 6.0–8.3)

## 2023-05-21 LAB — MICROALBUMIN / CREATININE URINE RATIO
Creatinine,U: 121.4 mg/dL
Microalb Creat Ratio: 7.6 mg/g (ref 0.0–30.0)
Microalb, Ur: 0.9 mg/dL (ref 0.0–1.9)

## 2023-05-21 LAB — LIPID PANEL
Cholesterol: 233 mg/dL — ABNORMAL HIGH (ref 0–200)
HDL: 52.7 mg/dL (ref >39.00)
LDL Cholesterol: 126 mg/dL — ABNORMAL HIGH (ref 0–99)
NonHDL: 180.2
Total CHOL/HDL Ratio: 4
Triglycerides: 272 mg/dL — ABNORMAL HIGH (ref 0.0–149.0)
VLDL: 54.4 mg/dL — ABNORMAL HIGH (ref 0.0–40.0)

## 2023-05-21 LAB — LDL CHOLESTEROL, DIRECT: Direct LDL: 159 mg/dL

## 2023-05-21 LAB — TSH: TSH: 1.11 u[IU]/mL (ref 0.35–5.50)

## 2023-05-21 LAB — HEMOGLOBIN A1C: Hgb A1c MFr Bld: 5.3 % (ref 4.6–6.5)

## 2023-05-21 NOTE — Assessment & Plan Note (Signed)
 Managed with carvedilol  and prn diltiazem . Sees Dr Gollan annually; encouraged to exercise

## 2023-05-21 NOTE — Assessment & Plan Note (Signed)
 Intolerant of  once weekly  rosuvastatin  due to recurrent myalgias;  taking zetia  and applyng for Repatha .  CAC score of 21,  goal LDL < 70  Lab Results  Component Value Date   CHOL 239 (H) 03/27/2022   HDL 52.40 03/27/2022   LDLCALC 168 (H) 03/20/2021   LDLDIRECT 160.0 03/27/2022   TRIG 219.0 (H) 03/27/2022   CHOLHDL 5 03/27/2022   Lab Results  Component Value Date   ALT 16 03/27/2022   AST 13 03/27/2022   ALKPHOS 84 03/27/2022   BILITOT 0.6 03/27/2022

## 2023-05-21 NOTE — Assessment & Plan Note (Signed)
Well controlled on current regimen. Renal function due no changes today.

## 2023-05-22 ENCOUNTER — Encounter: Payer: Self-pay | Admitting: Internal Medicine

## 2023-08-18 ENCOUNTER — Ambulatory Visit: Attending: Internal Medicine | Admitting: Physical Therapy

## 2023-08-18 DIAGNOSIS — M6281 Muscle weakness (generalized): Secondary | ICD-10-CM | POA: Diagnosis not present

## 2023-08-18 DIAGNOSIS — M79601 Pain in right arm: Secondary | ICD-10-CM | POA: Insufficient documentation

## 2023-08-18 DIAGNOSIS — M542 Cervicalgia: Secondary | ICD-10-CM | POA: Diagnosis not present

## 2023-08-18 DIAGNOSIS — M2560 Stiffness of unspecified joint, not elsewhere classified: Secondary | ICD-10-CM | POA: Diagnosis not present

## 2023-08-22 ENCOUNTER — Encounter: Payer: Self-pay | Admitting: Physical Therapy

## 2023-08-22 NOTE — Therapy (Addendum)
 OUTPATIENT PHYSICAL THERAPY CERVICAL EVALUATION  Patient Name: Amanda Humphrey MRN: [id number] DOB:31-Mar-1965, 58 y.o., female Today's Date: 08/18/2023  PCP: Marylynn Verneita CROME, MD REFERRING PROVIDER: Marylynn Verneita CROME, MD   PT End of Session - 08/22/23 1927     Visit Number 1    Number of Visits 12    Date for PT Re-Evaluation 11/10/23    PT Start Time 1612    PT Stop Time 1705    PT Time Calculation (min) 53 min    Activity Tolerance Patient tolerated treatment well    Behavior During Therapy [facility] for tasks assessed/performed          Past Medical History:  Diagnosis Date   Allergy    Arthritis    Chronic kidney disease    stones   Fibrocystic breast disease 2013   GERD (gastroesophageal reflux disease)    Hyperlipidemia    Hypertension    Meniere disease    Migraines    Neuromuscular disorder (HCC) 2019   Past Surgical History:  Procedure Laterality Date   ABDOMINAL HYSTERECTOMY  2011   laprascopic supracervical, [place]   APPENDECTOMY  1999   BREAST BIOPSY Right 11/2010    fibrocystic disease, [place]   CESAREAN SECTION     4 total   CHOLECYSTECTOMY  2011   SPINE SURGERY  2021   TONSILLECTOMY AND ADENOIDECTOMY  1975   TUBAL LIGATION     Patient Active Problem List   Diagnosis Date Noted   Statin myopathy 05/20/2023   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   H/O of hemilaminectomy 02/15/2019   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   Fatigue 09/23/2017   History of pneumonia 04/30/2016   Grief reaction 04/30/2016   Vitamin D  deficiency 02/22/2015   Hyperlipidemia 01/01/2015   Visit for preventive health examination 12/04/2013   Lower extremity edema 09/02/2013   Essential hypertension 09/02/2013   History of cardiomyopathy 09/02/2013   SVT (supraventricular tachycardia) (HCC) 09/02/2013   Contrast media allergy 03/10/2013   Fibrocystic breast disease    Morbid obesity (HCC) 02/05/2012   Meniere disease     PCP: Marylynn Verneita CROME,  MD  REFERRING PROVIDER: Marylynn Verneita CROME, MD  REFERRING DIAG: [phone] (ICD-10-CM) - Chronic neck pain   THERAPY DIAG:  Neck pain  Joint stiffness of spine  Pain in right arm  Muscle weakness (generalized)  Rationale for Evaluation and Treatment: Rehabilitation  ONSET DATE: chronic  SUBJECTIVE:  SUBJECTIVE STATEMENT: Pt. Well known to PT clinic for chronic neck pain (see previous PT notes).  Pt. Reports 3-4/10 neck pain (L and R C3-4 region).  Pt. Describes pain as sharp in R c-spine (6/10) and c/o 9/10 pain at worst with residual HA.  Pt. States HA have been daily in occipital region (burning, stabbing).   Pt. C/o persistent tension in neck and has not participated with PT in several months due to work/ family/ schedule.  Pt. Has tingling sensation in R hand and c/o muscle contraction in L hand. Hand dominance: Right  PERTINENT HISTORY:  See MD/ PT notes.    PAIN:  Are you having pain? Yes: NPRS scale: 3/10  Pain location: cervical paraspinals/ UT musculature Pain description: achy/ tender Aggravating factors: prolonged position/ repeated movement Relieving factors: rest/ massage  PRECAUTIONS: Cervical  RED FLAGS: None    WEIGHT BEARING RESTRICTIONS: No  FALLS:  Has patient fallen in last 6 months? 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    NEXT MD VISIT: PRN  OBJECTIVE:  Note: Objective measures were completed at Evaluation unless otherwise noted.  DIAGNOSTIC FINDINGS:  See MD notes  PATIENT SURVEYS:  NDI: 46% self-perceived moderate  disability.    COGNITION: Overall cognitive status: Within functional limits for tasks assessed  SENSATION: WFL  POSTURE: rounded shoulders and forward head  PALPATION: (+) tenderness   CERVICAL ROM:   Active ROM A/PROM (deg) eval  Flexion 51 deg.  Extension 28 deg.  Right lateral flexion 60 deg.  Left lateral flexion 58 deg.  Right rotation 37 deg.  Left rotation 33 deg. (Pain)   (Blank rows = not tested)  UPPER EXTREMITY ROM:  Active ROM Right eval Left eval  Shoulder flexion [facility]  Shoulder extension    Shoulder abduction [facility]  Shoulder adduction    Shoulder extension    Shoulder internal rotation [facility]  Shoulder external rotation [facility]  Elbow flexion    Elbow extension    Wrist flexion    Wrist extension    Wrist ulnar deviation    Wrist radial deviation    Wrist pronation    Wrist supination     (Blank rows = not tested)  UPPER EXTREMITY MMT:  MMT Right eval Left eval  Shoulder flexion 4- 4-  Shoulder extension    Shoulder abduction 4- 4-  Shoulder adduction    Shoulder extension    Shoulder internal rotation 4 4  Shoulder external rotation 4 4  Middle trapezius    Lower trapezius    Elbow flexion 4+ 4+  Elbow extension 5 5  Wrist flexion    Wrist extension    Wrist ulnar deviation    Wrist radial deviation    Wrist pronation    Wrist supination    Grip strength     (Blank rows = not tested)  CERVICAL SPECIAL TESTS:  NT  FUNCTIONAL TESTS:  NT  TREATMENT DATE: 08/18/23  See evaluation/ manual tx.    PATIENT EDUCATION:  Education details: Discussed POC/ posture Person educated: Patient Education method: Medical illustrator Education comprehension: verbalized understanding and returned demonstration  HOME EXERCISE PROGRAM: Reviewed HEP  ASSESSMENT:  CLINICAL IMPRESSION: Patient is  a pleasant 58 y.o. female who was seen today for physical therapy evaluation and treatment for chronic neck pain.  Pt. Reports persistent neck pain (3-4/10)/ tightness/ tension with h/o headaches.  Pt. Presents with pain limited cervical AROM and generalized B UE muscle weakness.  Forward head/ rounded shoulder posture in sitting/ standing.  Pt. Has (+) tenderness over R upper cervical paraspinals and B UT trigger points with palpation.  Pt. Will benefit from skilled PT services 1x/week to manage cervical limitations to improve pain-free mobility with daily/ work-related tasks.   OBJECTIVE IMPAIRMENTS: decreased mobility, decreased ROM, decreased strength, hypomobility, impaired flexibility, impaired UE functional use, improper body mechanics, postural dysfunction, and pain.   ACTIVITY LIMITATIONS: carrying, lifting, reach over head, hygiene/grooming, and caring for others  PARTICIPATION LIMITATIONS: meal prep, cleaning, laundry, driving, shopping, community activity, and occupation  PERSONAL FACTORS: Fitness and Past/current experiences are also affecting patient's functional outcome.   REHAB POTENTIAL: Good  CLINICAL DECISION MAKING: Evolving/moderate complexity  EVALUATION COMPLEXITY: Moderate   GOALS: Goals reviewed with patient? Yes  SHORT TERM GOALS: Target date: 09/08/23  Pt. Independent with HEP to increase cervical AROM 25% to improve pain-free mobility.   Baseline:  see above Goal status: INITIAL  2.  Pt. Will demonstrate proper seated upright posture to decrease neck pain with work-related tasks.  Baseline:  forward head, rounded sh. Posture.  Goal status: INITIAL  LONG TERM GOALS: Target date: 11/10/23  Pt. Will decrease NDI to <30% to improve pain-free mobility with daily tasks.  Baseline: 46% Goal status: INITIAL  2.  Pt. Will report no UE radicular symptoms/ tingling in hand with daily household/ work-related tasks to improve pain-free mobility.   Baseline:  R hand  tingling Goal status: INITIAL  3.  Pt. Will report minimal B UT trigger point tenderness to improve pain-free mobility.   Baseline: (+) tenderness Goal status: INITIAL  PLAN:  PT FREQUENCY: 1-2x/week  PT DURATION: 12 weeks  PLANNED INTERVENTIONS: 97750- Physical Performance Testing, 97110-Therapeutic exercises, 97530- Therapeutic activity, [id number]- Neuromuscular re-education, 97535- Self Care, 02859- Manual therapy, [id number]- Electrical stimulation (unattended), [id number]- Electrical stimulation (manual), 20560 (1-2 muscles), 20561 (3+ muscles)- Dry Needling, Patient/Family education, Joint mobilization, Spinal mobilization, Cryotherapy, and Moist heat  PLAN FOR NEXT SESSION: Reassess cervical AROM.  Ozell JAYSON Sero, PT, DPT # [phone] 08/22/2023, 7:31 PM

## 2023-08-24 ENCOUNTER — Ambulatory Visit: Admitting: Physical Therapy

## 2023-08-24 DIAGNOSIS — M542 Cervicalgia: Secondary | ICD-10-CM

## 2023-08-24 DIAGNOSIS — M79601 Pain in right arm: Secondary | ICD-10-CM | POA: Diagnosis not present

## 2023-08-24 DIAGNOSIS — M6281 Muscle weakness (generalized): Secondary | ICD-10-CM | POA: Diagnosis not present

## 2023-08-24 DIAGNOSIS — M2560 Stiffness of unspecified joint, not elsewhere classified: Secondary | ICD-10-CM | POA: Diagnosis not present

## 2023-08-27 ENCOUNTER — Encounter: Payer: Self-pay | Admitting: Physical Therapy

## 2023-08-27 NOTE — Therapy (Addendum)
 OUTPATIENT PHYSICAL THERAPY CERVICAL TREATMENT  Patient Name: Amanda Humphrey MRN: [id number] DOB:10/15/1965, 58 y.o., female Today's Date: 08/18/2023  PCP: Marylynn Verneita CROME, MD REFERRING PROVIDER: Marylynn Verneita CROME, MD   PT End of Session - 08/25/23 1927       Visit Number 2     Number of Visits 12     Date for PT Re-Evaluation 11/10/23     PT Start Time 1031     PT Stop Time 1114    PT Time Calculation (min) 43 min     Activity Tolerance Patient tolerated treatment well     Behavior During Therapy [facility] for tasks assessed/performed     Past Medical History:  Diagnosis Date   Allergy    Arthritis    Chronic kidney disease    stones   Fibrocystic breast disease 2013   GERD (gastroesophageal reflux disease)    Hyperlipidemia    Hypertension    Meniere disease    Migraines    Neuromuscular disorder (HCC) 2019   Past Surgical History:  Procedure Laterality Date   ABDOMINAL HYSTERECTOMY  2011   laprascopic supracervical, [place]   APPENDECTOMY  1999   BREAST BIOPSY Right 11/2010    fibrocystic disease, [place]   CESAREAN SECTION     4 total   CHOLECYSTECTOMY  2011   SPINE SURGERY  2021   TONSILLECTOMY AND ADENOIDECTOMY  1975   TUBAL LIGATION     Patient Active Problem List   Diagnosis Date Noted   Statin myopathy 05/20/2023   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   H/O of hemilaminectomy 02/15/2019   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   Fatigue 09/23/2017   History of pneumonia 04/30/2016   Grief reaction 04/30/2016   Vitamin D  deficiency 02/22/2015   Hyperlipidemia 01/01/2015   Visit for preventive health examination 12/04/2013   Lower extremity edema 09/02/2013   Essential hypertension 09/02/2013   History of cardiomyopathy 09/02/2013   SVT (supraventricular tachycardia) (HCC) 09/02/2013   Contrast media allergy 03/10/2013   Fibrocystic breast disease    Morbid obesity (HCC) 02/05/2012   Meniere disease     PCP: Marylynn Verneita CROME,  MD  REFERRING PROVIDER: Marylynn Verneita CROME, MD  REFERRING DIAG: [phone] (ICD-10-CM) - Chronic neck pain   THERAPY DIAG:  Neck pain  Joint stiffness of spine  Pain in right arm  Muscle weakness (generalized)  Rationale for Evaluation and Treatment: Rehabilitation  ONSET DATE: chronic  SUBJECTIVE:  SUBJECTIVE STATEMENT: Pt. Well known to PT clinic for chronic neck pain (see previous PT notes).  Pt. Reports 3-4/10 neck pain (L and R C3-4 region).  Pt. Describes pain as sharp in R c-spine (6/10) and c/o 9/10 pain at worst with residual HA.  Pt. States HA have been daily in occipital region (burning, stabbing).   Pt. C/o persistent tension in neck and has not participated with PT in several months due to work/ family/ schedule.  Pt. Has tingling sensation in R hand and c/o muscle contraction in L hand. Hand dominance: Right  PERTINENT HISTORY:  See MD/ PT notes.    PAIN:  Are you having pain? Yes: NPRS scale: 3/10  Pain location: cervical paraspinals/ UT musculature Pain description: achy/ tender Aggravating factors: prolonged position/ repeated movement Relieving factors: rest/ massage  PRECAUTIONS: Cervical  RED FLAGS: None    WEIGHT BEARING RESTRICTIONS: No  FALLS:  Has patient fallen in last 6 months? 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    NEXT MD VISIT: PRN  OBJECTIVE:  Note: Objective measures were completed at Evaluation unless otherwise noted.  DIAGNOSTIC FINDINGS:  See MD notes  PATIENT SURVEYS:  NDI: 46% self-perceived moderate  disability.    COGNITION: Overall cognitive status: Within functional limits for tasks assessed  SENSATION: WFL  POSTURE: rounded shoulders and forward head  PALPATION: (+) tenderness   CERVICAL ROM:   Active ROM A/PROM (deg) eval  Flexion 51 deg.  Extension 28 deg.  Right lateral flexion 60 deg.  Left lateral flexion 58 deg.  Right rotation 37 deg.  Left rotation 33 deg. (Pain)   (Blank rows = not tested)  UPPER EXTREMITY ROM:  Active ROM Right eval Left eval  Shoulder flexion [facility]  Shoulder extension    Shoulder abduction [facility]  Shoulder adduction    Shoulder extension    Shoulder internal rotation [facility]  Shoulder external rotation [facility]  Elbow flexion    Elbow extension    Wrist flexion    Wrist extension    Wrist ulnar deviation    Wrist radial deviation    Wrist pronation    Wrist supination     (Blank rows = not tested)  UPPER EXTREMITY MMT:  MMT Right eval Left eval  Shoulder flexion 4- 4-  Shoulder extension    Shoulder abduction 4- 4-  Shoulder adduction    Shoulder extension    Shoulder internal rotation 4 4  Shoulder external rotation 4 4  Middle trapezius    Lower trapezius    Elbow flexion 4+ 4+  Elbow extension 5 5  Wrist flexion    Wrist extension    Wrist ulnar deviation    Wrist radial deviation    Wrist pronation    Wrist supination    Grip strength     (Blank rows = not tested)  CERVICAL SPECIAL TESTS:  NT  FUNCTIONAL TESTS:  NT  TREATMENT DATE: 08/24/23  Subjective:  Pt. Reports no new complaints.  Pt. Continues to c/o B suboccipital/ mid-cervical discomfort with rotn. And lateral flexion.  Pt. C/o 3-4/10 neck pain prior to tx. Session.    Manual tx.:  Seated/ prone STM to B UT (R>L)/ mid-thoracic paraspinals.     Prone TrP release technique to L/R UT region.     Supine STM/  stretches on bilateral cervical paraspinals, levator scap, upper trap.  Supine cervical traction/ suboccipital release technique in supine 3x with holds.  Supine L/R cervical rotn. Stretches with holds (pain tolerable range).     Supine assessment of L/R shoulder A/AROM (all planes).    Seated reassessment of cervical AROM (all planes)- limited lat. flexion   Application of Biofreeze to neck/ upper back in position after tx. Session.     PATIENT EDUCATION:  Education details: Discussed POC/ posture Person educated: Patient Education method: Medical illustrator Education comprehension: verbalized understanding and returned demonstration  HOME EXERCISE PROGRAM: Reviewed HEP  ASSESSMENT:  CLINICAL IMPRESSION: Pt. Reports persistent neck pain (3-4/10)/ tightness/ tension with h/o headaches.  Pt. Presents with pain limited cervical AROM and generalized B UE muscle weakness.  Forward head/ rounded shoulder posture in sitting/ standing.  Pt. Has (+) tenderness over R upper cervical paraspinals and B UT trigger points with palpation.  Pt. Will benefit from skilled PT services 1x/week to manage cervical limitations to improve pain-free mobility with daily/ work-related tasks.   OBJECTIVE IMPAIRMENTS: decreased mobility, decreased ROM, decreased strength, hypomobility, impaired flexibility, impaired UE functional use, improper body mechanics, postural dysfunction, and pain.   ACTIVITY LIMITATIONS: carrying, lifting, reach over head, hygiene/grooming, and caring for others  PARTICIPATION LIMITATIONS: meal prep, cleaning, laundry, driving, shopping, community activity, and occupation  PERSONAL FACTORS: Fitness and Past/current experiences are also affecting patient's functional outcome.   REHAB POTENTIAL: Good  CLINICAL DECISION MAKING: Evolving/moderate complexity  EVALUATION COMPLEXITY: Moderate   GOALS: Goals reviewed with patient? Yes  SHORT TERM GOALS: Target date:  09/08/23  Pt. Independent with HEP to increase cervical AROM 25% to improve pain-free mobility.   Baseline:  see above Goal status: INITIAL  2.  Pt. Will demonstrate proper seated upright posture to decrease neck pain with work-related tasks.  Baseline:  forward head, rounded sh. Posture.  Goal status: INITIAL  LONG TERM GOALS: Target date: 11/10/23  Pt. Will decrease NDI to <30% to improve pain-free mobility with daily tasks.  Baseline: 46% Goal status: INITIAL  2.  Pt. Will report no UE radicular symptoms/ tingling in hand with daily household/ work-related tasks to improve pain-free mobility.   Baseline:  R hand tingling Goal status: INITIAL  3.  Pt. Will report minimal B UT trigger point tenderness to improve pain-free mobility.   Baseline: (+) tenderness Goal status: INITIAL  PLAN:  PT FREQUENCY: 1-2x/week  PT DURATION: 12 weeks  PLANNED INTERVENTIONS: 97750- Physical Performance Testing, 97110-Therapeutic exercises, 97530- Therapeutic activity, [id number]- Neuromuscular re-education, 97535- Self Care, 02859- Manual therapy, G0283- Electrical stimulation (unattended), [id number]- Electrical stimulation (manual), 20560 (1-2 muscles), 20561 (3+ muscles)- Dry Needling, Patient/Family education, Joint mobilization, Spinal mobilization, Cryotherapy, and Moist heat  PLAN FOR NEXT SESSION: Reassess cervical AROM.  Ozell JAYSON Sero, PT, DPT # [phone] 09/19/2023, 4:28 PM

## 2023-09-03 ENCOUNTER — Ambulatory Visit: Admitting: Physical Therapy

## 2023-09-10 ENCOUNTER — Ambulatory Visit: Attending: Internal Medicine | Admitting: Physical Therapy

## 2023-09-10 DIAGNOSIS — M542 Cervicalgia: Secondary | ICD-10-CM | POA: Insufficient documentation

## 2023-09-10 DIAGNOSIS — M2560 Stiffness of unspecified joint, not elsewhere classified: Secondary | ICD-10-CM | POA: Diagnosis not present

## 2023-09-10 DIAGNOSIS — M6281 Muscle weakness (generalized): Secondary | ICD-10-CM | POA: Diagnosis not present

## 2023-09-10 DIAGNOSIS — M79601 Pain in right arm: Secondary | ICD-10-CM | POA: Diagnosis not present

## 2023-09-11 NOTE — Addendum Note (Signed)
 Addended by: ILEENE SHARPER C on: 09/11/2023 11:06 AM   Modules accepted: Orders

## 2023-09-17 ENCOUNTER — Ambulatory Visit: Admitting: Physical Therapy

## 2023-09-19 NOTE — Therapy (Signed)
 OUTPATIENT PHYSICAL THERAPY CERVICAL TREATMENT  Patient Name: Amanda Humphrey MRN: [id number] DOB:[birth year], 58 y.o., female Today's Date: 09/10/2023  PCP: Marylynn Verneita CROME, MD REFERRING PROVIDER: Marylynn Verneita CROME, MD   PT End of Session - 09/19/23 1640     Visit Number 3    Number of Visits 12    Date for PT Re-Evaluation 11/10/23    PT Start Time 0946    PT Stop Time 1030    PT Time Calculation (min) 44 min    Activity Tolerance Patient tolerated treatment well    Behavior During Therapy [facility] for tasks assessed/performed         Past Medical History:  Diagnosis Date   Allergy    Arthritis    Chronic kidney disease    stones   Fibrocystic breast disease 2013   GERD (gastroesophageal reflux disease)    Hyperlipidemia    Hypertension    Meniere disease    Migraines    Neuromuscular disorder (HCC) 2019   Past Surgical History:  Procedure Laterality Date   ABDOMINAL HYSTERECTOMY  2011   laprascopic supracervical, [place]   APPENDECTOMY  1999   BREAST BIOPSY Right 11/2010    fibrocystic disease, [place]   CESAREAN SECTION     4 total   CHOLECYSTECTOMY  2011   SPINE SURGERY  2021   TONSILLECTOMY AND ADENOIDECTOMY  1975   TUBAL LIGATION     Patient Active Problem List   Diagnosis Date Noted   Statin myopathy 05/20/2023   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   H/O of hemilaminectomy 02/15/2019   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   Fatigue 09/23/2017   History of pneumonia 04/30/2016   Grief reaction 04/30/2016   Vitamin D  deficiency 02/22/2015   Hyperlipidemia 01/01/2015   Visit for preventive health examination 12/04/2013   Lower extremity edema 09/02/2013   Essential hypertension 09/02/2013   History of cardiomyopathy 09/02/2013   SVT (supraventricular tachycardia) (HCC) 09/02/2013   Contrast media allergy 03/10/2013   Fibrocystic breast disease    Morbid obesity (HCC) 02/05/2012   Meniere disease     PCP: Marylynn Verneita CROME,  MD  REFERRING PROVIDER: Marylynn Verneita CROME, MD  REFERRING DIAG: [phone] (ICD-10-CM) - Chronic neck pain   THERAPY DIAG:  Neck pain  Joint stiffness of spine  Pain in right arm  Muscle weakness (generalized)  Rationale for Evaluation and Treatment: Rehabilitation  ONSET DATE: chronic  SUBJECTIVE:  SUBJECTIVE STATEMENT: Pt. Well known to PT clinic for chronic neck pain (see previous PT notes).  Pt. Reports 3-4/10 neck pain (L and R C3-4 region).  Pt. Describes pain as sharp in R c-spine (6/10) and c/o 9/10 pain at worst with residual HA.  Pt. States HA have been daily in occipital region (burning, stabbing).   Pt. C/o persistent tension in neck and has not participated with PT in several months due to work/ family/ schedule.  Pt. Has tingling sensation in R hand and c/o muscle contraction in L hand. Hand dominance: Right  PERTINENT HISTORY:  See MD/ PT notes.    PAIN:  Are you having pain? Yes: NPRS scale: 3/10  Pain location: cervical paraspinals/ UT musculature Pain description: achy/ tender Aggravating factors: prolonged position/ repeated movement Relieving factors: rest/ massage  PRECAUTIONS: Cervical  RED FLAGS: None    WEIGHT BEARING RESTRICTIONS: No  FALLS:  Has patient fallen in last 6 months? 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    NEXT MD VISIT: PRN  OBJECTIVE:  Note: Objective measures were completed at Evaluation unless otherwise noted.  DIAGNOSTIC FINDINGS:  See MD notes  PATIENT SURVEYS:  NDI: 46% self-perceived moderate  disability.    COGNITION: Overall cognitive status: Within functional limits for tasks assessed  SENSATION: WFL  POSTURE: rounded shoulders and forward head  PALPATION: (+) tenderness   CERVICAL ROM:   Active ROM A/PROM (deg) eval  Flexion 51 deg.  Extension 28 deg.  Right lateral flexion 60 deg.  Left lateral flexion 58 deg.  Right rotation 37 deg.  Left rotation 33 deg. (Pain)   (Blank rows = not tested)  UPPER EXTREMITY ROM:  Active ROM Right eval Left eval  Shoulder flexion [facility]  Shoulder extension    Shoulder abduction [facility]  Shoulder adduction    Shoulder extension    Shoulder internal rotation [facility]  Shoulder external rotation [facility]  Elbow flexion    Elbow extension    Wrist flexion    Wrist extension    Wrist ulnar deviation    Wrist radial deviation    Wrist pronation    Wrist supination     (Blank rows = not tested)  UPPER EXTREMITY MMT:  MMT Right eval Left eval  Shoulder flexion 4- 4-  Shoulder extension    Shoulder abduction 4- 4-  Shoulder adduction    Shoulder extension    Shoulder internal rotation 4 4  Shoulder external rotation 4 4  Middle trapezius    Lower trapezius    Elbow flexion 4+ 4+  Elbow extension 5 5  Wrist flexion    Wrist extension    Wrist ulnar deviation    Wrist radial deviation    Wrist pronation    Wrist supination    Grip strength     (Blank rows = not tested)  CERVICAL SPECIAL TESTS:  NT  FUNCTIONAL TESTS:  NT  TREATMENT DATE: 09/10/23  Subjective:  Pt. Reports no new complaints.  Pt. Continues to c/o B suboccipital/ mid-cervical discomfort with rotn. And lateral flexion.  Pt. C/o 3-4/10 neck pain prior to tx. Session.    Manual tx.:  Seated/ prone STM to B UT (R>L)/ mid-thoracic paraspinals.     Prone TrP release technique to L/R UT region.     Supine STM/  stretches on bilateral cervical paraspinals, levator scap, upper trap.  Supine cervical traction/ suboccipital release technique in supine 3x with holds.  Supine L/R cervical rotn. Stretches with holds (pain tolerable range).     Seated cervical/ thoracic rotn. To L/R in pain tolerable range.  Reassessment of B UT trigger points during shoulder flexion/ abduction.    Application of Biofreeze to neck/ upper back in prone position after tx. Session.     PATIENT EDUCATION:  Education details: Discussed POC/ posture Person educated: Patient Education method: Medical illustrator Education comprehension: verbalized understanding and returned demonstration  HOME EXERCISE PROGRAM: Reviewed HEP  ASSESSMENT:  CLINICAL IMPRESSION: Pt. Reports persistent neck pain (3-4/10)/ tightness/ tension with h/o headaches.  Pt. Presents with pain limited cervical AROM and generalized B UE muscle weakness.  Forward head/ rounded shoulder posture in sitting/ standing.  Pt. Has (+) tenderness over R upper cervical paraspinals/ L UT trigger point with palpation.  Pt. Instructed in posture correction and discussed benefits of posture/ UE strengthening.  Pt. Will benefit from skilled PT services 1x/week to manage cervical limitations to improve pain-free mobility with daily/ work-related tasks.   OBJECTIVE IMPAIRMENTS: decreased mobility, decreased ROM, decreased strength, hypomobility, impaired flexibility, impaired UE functional use, improper body mechanics, postural dysfunction, and pain.   ACTIVITY LIMITATIONS: carrying, lifting, reach over head, hygiene/grooming, and caring for others  PARTICIPATION LIMITATIONS: meal prep, cleaning, laundry, driving, shopping, community activity, and occupation  PERSONAL FACTORS: Fitness and Past/current experiences are also affecting patient's functional outcome.   REHAB POTENTIAL: Good  CLINICAL DECISION MAKING: Evolving/moderate complexity  EVALUATION  COMPLEXITY: Moderate   GOALS: Goals reviewed with patient? Yes  SHORT TERM GOALS: Target date: 09/08/23  Pt. Independent with HEP to increase cervical AROM 25% to improve pain-free mobility.   Baseline:  see above Goal status: INITIAL  2.  Pt. Will demonstrate proper seated upright posture to decrease neck pain with work-related tasks.  Baseline:  forward head, rounded sh. Posture.  Goal status: INITIAL  LONG TERM GOALS: Target date: 11/10/23  Pt. Will decrease NDI to <30% to improve pain-free mobility with daily tasks.  Baseline: 46% Goal status: INITIAL  2.  Pt. Will report no UE radicular symptoms/ tingling in hand with daily household/ work-related tasks to improve pain-free mobility.   Baseline:  R hand tingling Goal status: INITIAL  3.  Pt. Will report minimal B UT trigger point tenderness to improve pain-free mobility.   Baseline: (+) tenderness Goal status: INITIAL  PLAN:  PT FREQUENCY: 1-2x/week  PT DURATION: 12 weeks  PLANNED INTERVENTIONS: 97750- Physical Performance Testing, 97110-Therapeutic exercises, 97530- Therapeutic activity, [id number]- Neuromuscular re-education, 97535- Self Care, 02859- Manual therapy, G0283- Electrical stimulation (unattended), [id number]- Electrical stimulation (manual), 20560 (1-2 muscles), 20561 (3+ muscles)- Dry Needling, Patient/Family education, Joint mobilization, Spinal mobilization, Cryotherapy, and Moist heat  PLAN FOR NEXT SESSION: Reassess cervical AROM/ UT resisted ex. With proper upright posture.   Ozell JAYSON Sero, PT, DPT # [phone] 09/19/2023, 4:41 PM

## 2023-09-24 ENCOUNTER — Ambulatory Visit: Admitting: Physical Therapy

## 2023-09-24 DIAGNOSIS — M542 Cervicalgia: Secondary | ICD-10-CM

## 2023-09-24 DIAGNOSIS — M6281 Muscle weakness (generalized): Secondary | ICD-10-CM

## 2023-09-24 DIAGNOSIS — M79601 Pain in right arm: Secondary | ICD-10-CM | POA: Diagnosis not present

## 2023-09-24 DIAGNOSIS — M2560 Stiffness of unspecified joint, not elsewhere classified: Secondary | ICD-10-CM | POA: Diagnosis not present

## 2023-09-26 NOTE — Therapy (Signed)
 OUTPATIENT PHYSICAL THERAPY CERVICAL TREATMENT  Patient Name: Amanda Humphrey MRN: [id number] DOB:March 01, 1965, 58 y.o., female Today's Date: 09/24/2023  PCP: Marylynn Verneita CROME, MD REFERRING PROVIDER: Marylynn Verneita CROME, MD   PT End of Session - 09/26/23 1950     Visit Number 4    Number of Visits 12    Date for PT Re-Evaluation 11/10/23    PT Start Time 0945    PT Stop Time 1028    PT Time Calculation (min) 43 min    Activity Tolerance Patient tolerated treatment well    Behavior During Therapy [facility] for tasks assessed/performed         Past Medical History:  Diagnosis Date   Allergy    Arthritis    Chronic kidney disease    stones   Fibrocystic breast disease 2013   GERD (gastroesophageal reflux disease)    Hyperlipidemia    Hypertension    Meniere disease    Migraines    Neuromuscular disorder (HCC) 2019   Past Surgical History:  Procedure Laterality Date   ABDOMINAL HYSTERECTOMY  2011   laprascopic supracervical, [place]   APPENDECTOMY  1999   BREAST BIOPSY Right 11/2010    fibrocystic disease, [place]   CESAREAN SECTION     4 total   CHOLECYSTECTOMY  2011   SPINE SURGERY  2021   TONSILLECTOMY AND ADENOIDECTOMY  1975   TUBAL LIGATION     Patient Active Problem List   Diagnosis Date Noted   Statin myopathy 05/20/2023   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   H/O of hemilaminectomy 02/15/2019   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   Fatigue 09/23/2017   History of pneumonia 04/30/2016   Grief reaction 04/30/2016   Vitamin D  deficiency 02/22/2015   Hyperlipidemia 01/01/2015   Visit for preventive health examination 12/04/2013   Lower extremity edema 09/02/2013   Essential hypertension 09/02/2013   History of cardiomyopathy 09/02/2013   SVT (supraventricular tachycardia) (HCC) 09/02/2013   Contrast media allergy 03/10/2013   Fibrocystic breast disease    Morbid obesity (HCC) 02/05/2012   Meniere disease     PCP: Marylynn Verneita CROME,  MD  REFERRING PROVIDER: Marylynn Verneita CROME, MD  REFERRING DIAG: [phone] (ICD-10-CM) - Chronic neck pain   THERAPY DIAG:  Neck pain  Joint stiffness of spine  Pain in right arm  Muscle weakness (generalized)  Rationale for Evaluation and Treatment: Rehabilitation  ONSET DATE: chronic  SUBJECTIVE:  SUBJECTIVE STATEMENT: Pt. Well known to PT clinic for chronic neck pain (see previous PT notes).  Pt. Reports 3-4/10 neck pain (L and R C3-4 region).  Pt. Describes pain as sharp in R c-spine (6/10) and c/o 9/10 pain at worst with residual HA.  Pt. States HA have been daily in occipital region (burning, stabbing).   Pt. C/o persistent tension in neck and has not participated with PT in several months due to work/ family/ schedule.  Pt. Has tingling sensation in R hand and c/o muscle contraction in L hand. Hand dominance: Right  PERTINENT HISTORY:  See MD/ PT notes.    PAIN:  Are you having pain? Yes: NPRS scale: 3/10  Pain location: cervical paraspinals/ UT musculature Pain description: achy/ tender Aggravating factors: prolonged position/ repeated movement Relieving factors: rest/ massage  PRECAUTIONS: Cervical  RED FLAGS: None    WEIGHT BEARING RESTRICTIONS: No  FALLS:  Has patient fallen in last 6 months? 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    NEXT MD VISIT: PRN  OBJECTIVE:  Note: Objective measures were completed at Evaluation unless otherwise noted.  DIAGNOSTIC FINDINGS:  See MD notes  PATIENT SURVEYS:  NDI: 46% self-perceived moderate  disability.    COGNITION: Overall cognitive status: Within functional limits for tasks assessed  SENSATION: WFL  POSTURE: rounded shoulders and forward head  PALPATION: (+) tenderness   CERVICAL ROM:   Active ROM A/PROM (deg) eval  Flexion 51 deg.  Extension 28 deg.  Right lateral flexion 60 deg.  Left lateral flexion 58 deg.  Right rotation 37 deg.  Left rotation 33 deg. (Pain)   (Blank rows = not tested)  UPPER EXTREMITY ROM:  Active ROM Right eval Left eval  Shoulder flexion [facility]  Shoulder extension    Shoulder abduction [facility]  Shoulder adduction    Shoulder extension    Shoulder internal rotation [facility]  Shoulder external rotation [facility]  Elbow flexion    Elbow extension    Wrist flexion    Wrist extension    Wrist ulnar deviation    Wrist radial deviation    Wrist pronation    Wrist supination     (Blank rows = not tested)  UPPER EXTREMITY MMT:  MMT Right eval Left eval  Shoulder flexion 4- 4-  Shoulder extension    Shoulder abduction 4- 4-  Shoulder adduction    Shoulder extension    Shoulder internal rotation 4 4  Shoulder external rotation 4 4  Middle trapezius    Lower trapezius    Elbow flexion 4+ 4+  Elbow extension 5 5  Wrist flexion    Wrist extension    Wrist ulnar deviation    Wrist radial deviation    Wrist pronation    Wrist supination    Grip strength     (Blank rows = not tested)  CERVICAL SPECIAL TESTS:  NT  FUNCTIONAL TESTS:  NT  TREATMENT DATE: 09/24/23  Subjective:  Pt. Cancelled last PT tx. Session secondary illness and pt. Is feeling better.  Pt. Continues to c/o B suboccipital/ mid-cervical discomfort with rotn. And lateral flexion.  Pt. C/o 3-4/10 neck pain prior to tx. Session.  No new complaints.    Manual tx.:  Seated/ prone STM to B UT (R>L)/ mid-thoracic paraspinals.      Prone TrP release technique to L/R UT region.     Supine STM/ stretches on bilateral cervical paraspinals, levator scap, upper trap.  Supine cervical traction/ suboccipital release technique in supine 3x with holds.  Supine L/R cervical rotn. Stretches with holds (pain tolerable range).     Seated cervical/ thoracic rotn. To L/R in pain tolerable range.  Reassessment of B UT trigger points during shoulder flexion/ abduction.    Reassessment of STGs (cervical rotn).   Application of Biofreeze to neck/ upper back in prone position after tx. Session.     PATIENT EDUCATION:  Education details: Discussed POC/ posture Person educated: Patient Education method: Medical illustrator Education comprehension: verbalized understanding and returned demonstration  HOME EXERCISE PROGRAM: Reviewed HEP  ASSESSMENT:  CLINICAL IMPRESSION: Pt. Reports persistent neck pain (3-4/10)/ tightness/ tension with h/o headaches.  Pt. Presents with pain limited cervical AROM and generalized B UE muscle weakness.  Forward head/ rounded shoulder posture in sitting/ standing.  Pt. Has (+) tenderness over R upper cervical paraspinals/ L UT trigger point with palpation.  Pt. Instructed in posture correction and discussed benefits of posture/ UE strengthening.  Pt. Will benefit from skilled PT services 1x/week to manage cervical limitations to improve pain-free mobility with daily/ work-related tasks.   OBJECTIVE IMPAIRMENTS: decreased mobility, decreased ROM, decreased strength, hypomobility, impaired flexibility, impaired UE functional use, improper body mechanics, postural dysfunction, and pain.   ACTIVITY LIMITATIONS: carrying, lifting, reach over head, hygiene/grooming, and caring for others  PARTICIPATION LIMITATIONS: meal prep, cleaning, laundry, driving, shopping, community activity, and occupation  PERSONAL FACTORS: Fitness and Past/current experiences are also affecting patient's functional  outcome.   REHAB POTENTIAL: Good  CLINICAL DECISION MAKING: Evolving/moderate complexity  EVALUATION COMPLEXITY: Moderate   GOALS: Goals reviewed with patient? Yes  SHORT TERM GOALS: Target date: 09/08/23  Pt. Independent with HEP to increase cervical AROM 25% to improve pain-free mobility.   Baseline:  see above Goal status: Partially met  2.  Pt. Will demonstrate proper seated upright posture to decrease neck pain with work-related tasks.  Baseline:  forward head, rounded sh. Posture.  Goal status: Not met  LONG TERM GOALS: Target date: 11/10/23  Pt. Will decrease NDI to <30% to improve pain-free mobility with daily tasks.  Baseline: 46% Goal status: INITIAL  2.  Pt. Will report no UE radicular symptoms/ tingling in hand with daily household/ work-related tasks to improve pain-free mobility.   Baseline:  R hand tingling Goal status: INITIAL  3.  Pt. Will report minimal B UT trigger point tenderness to improve pain-free mobility.   Baseline: (+) tenderness Goal status: INITIAL  PLAN:  PT FREQUENCY: 1-2x/week  PT DURATION: 12 weeks  PLANNED INTERVENTIONS: 97750- Physical Performance Testing, 97110-Therapeutic exercises, 97530- Therapeutic activity, [id number]- Neuromuscular re-education, 97535- Self Care, 02859- Manual therapy, G0283- Electrical stimulation (unattended), [id number]- Electrical stimulation (manual), 20560 (1-2 muscles), 20561 (3+ muscles)- Dry Needling, Patient/Family education, Joint mobilization, Spinal mobilization, Cryotherapy, and Moist heat  PLAN FOR NEXT SESSION: Reassess cervical AROM/ UT resisted ex. With proper upright posture.   Ozell JAYSON Sero, PT, DPT # [phone] 09/26/2023, 7:51 PM

## 2023-10-01 ENCOUNTER — Ambulatory Visit: Attending: Internal Medicine | Admitting: Physical Therapy

## 2023-10-01 DIAGNOSIS — M6281 Muscle weakness (generalized): Secondary | ICD-10-CM | POA: Diagnosis not present

## 2023-10-01 DIAGNOSIS — M542 Cervicalgia: Secondary | ICD-10-CM | POA: Diagnosis not present

## 2023-10-01 DIAGNOSIS — M2560 Stiffness of unspecified joint, not elsewhere classified: Secondary | ICD-10-CM | POA: Diagnosis not present

## 2023-10-01 DIAGNOSIS — M79601 Pain in right arm: Secondary | ICD-10-CM | POA: Insufficient documentation

## 2023-10-03 NOTE — Therapy (Signed)
 OUTPATIENT PHYSICAL THERAPY CERVICAL TREATMENT  Patient Name: Amanda Humphrey MRN: [id number] DOB:21-Mar-1965, 58 y.o., female Today's Date: 10/03/2023  PCP: Marylynn Verneita CROME, MD REFERRING PROVIDER: Marylynn Verneita CROME, MD   PT End of Session - 10/03/23 1749     Visit Number 5    Number of Visits 12    Date for PT Re-Evaluation 11/10/23    PT Start Time 0731    PT Stop Time 0809    PT Time Calculation (min) 38 min    Activity Tolerance Patient tolerated treatment well    Behavior During Therapy [facility] for tasks assessed/performed         Past Medical History:  Diagnosis Date   Allergy    Arthritis    Chronic kidney disease    stones   Fibrocystic breast disease 2013   GERD (gastroesophageal reflux disease)    Hyperlipidemia    Hypertension    Meniere disease    Migraines    Neuromuscular disorder (HCC) 2019   Past Surgical History:  Procedure Laterality Date   ABDOMINAL HYSTERECTOMY  2011   laprascopic supracervical, [place]   APPENDECTOMY  1999   BREAST BIOPSY Right 11/2010    fibrocystic disease, [place]   CESAREAN SECTION     4 total   CHOLECYSTECTOMY  2011   SPINE SURGERY  2021   TONSILLECTOMY AND ADENOIDECTOMY  1975   TUBAL LIGATION     Patient Active Problem List   Diagnosis Date Noted   Statin myopathy 05/20/2023   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   H/O of hemilaminectomy 02/15/2019   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   Fatigue 09/23/2017   History of pneumonia 04/30/2016   Grief reaction 04/30/2016   Vitamin D  deficiency 02/22/2015   Hyperlipidemia 01/01/2015   Visit for preventive health examination 12/04/2013   Lower extremity edema 09/02/2013   Essential hypertension 09/02/2013   History of cardiomyopathy 09/02/2013   SVT (supraventricular tachycardia) (HCC) 09/02/2013   Contrast media allergy 03/10/2013   Fibrocystic breast disease    Morbid obesity (HCC) 02/05/2012   Meniere disease     PCP: Marylynn Verneita CROME,  MD  REFERRING PROVIDER: Marylynn Verneita CROME, MD  REFERRING DIAG: [phone] (ICD-10-CM) - Chronic neck pain   THERAPY DIAG:  Neck pain  Joint stiffness of spine  Pain in right arm  Muscle weakness (generalized)  Rationale for Evaluation and Treatment: Rehabilitation  ONSET DATE: chronic  SUBJECTIVE:  SUBJECTIVE STATEMENT: Pt. Well known to PT clinic for chronic neck pain (see previous PT notes).  Pt. Reports 3-4/10 neck pain (L and R C3-4 region).  Pt. Describes pain as sharp in R c-spine (6/10) and c/o 9/10 pain at worst with residual HA.  Pt. States HA have been daily in occipital region (burning, stabbing).   Pt. C/o persistent tension in neck and has not participated with PT in several months due to work/ family/ schedule.  Pt. Has tingling sensation in R hand and c/o muscle contraction in L hand. Hand dominance: Right  PERTINENT HISTORY:  See MD/ PT notes.    PAIN:  Are you having pain? Yes: NPRS scale: 3/10  Pain location: cervical paraspinals/ UT musculature Pain description: achy/ tender Aggravating factors: prolonged position/ repeated movement Relieving factors: rest/ massage  PRECAUTIONS: Cervical  RED FLAGS: None    WEIGHT BEARING RESTRICTIONS: No  FALLS:  Has patient fallen in last 6 months? 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    NEXT MD VISIT: PRN  OBJECTIVE:  Note: Objective measures were completed at Evaluation unless otherwise noted.  DIAGNOSTIC FINDINGS:  See MD notes  PATIENT SURVEYS:  NDI: 46% self-perceived moderate  disability.    COGNITION: Overall cognitive status: Within functional limits for tasks assessed  SENSATION: WFL  POSTURE: rounded shoulders and forward head  PALPATION: (+) tenderness   CERVICAL ROM:   Active ROM A/PROM (deg) eval  Flexion 51 deg.  Extension 28 deg.  Right lateral flexion 60 deg.  Left lateral flexion 58 deg.  Right rotation 37 deg.  Left rotation 33 deg. (Pain)   (Blank rows = not tested)  UPPER EXTREMITY ROM:  Active ROM Right eval Left eval  Shoulder flexion [facility]  Shoulder extension    Shoulder abduction [facility]  Shoulder adduction    Shoulder extension    Shoulder internal rotation [facility]  Shoulder external rotation [facility]  Elbow flexion    Elbow extension    Wrist flexion    Wrist extension    Wrist ulnar deviation    Wrist radial deviation    Wrist pronation    Wrist supination     (Blank rows = not tested)  UPPER EXTREMITY MMT:  MMT Right eval Left eval  Shoulder flexion 4- 4-  Shoulder extension    Shoulder abduction 4- 4-  Shoulder adduction    Shoulder extension    Shoulder internal rotation 4 4  Shoulder external rotation 4 4  Middle trapezius    Lower trapezius    Elbow flexion 4+ 4+  Elbow extension 5 5  Wrist flexion    Wrist extension    Wrist ulnar deviation    Wrist radial deviation    Wrist pronation    Wrist supination    Grip strength     (Blank rows = not tested)  CERVICAL SPECIAL TESTS:  NT  FUNCTIONAL TESTS:  NT  TREATMENT DATE: 10/03/23  Subjective:  Pt. Cancelled last PT tx. Session secondary illness and pt. Is feeling better.  Pt. Continues to c/o B suboccipital/ mid-cervical discomfort with rotn. And lateral flexion.  Pt. C/o 3-4/10 neck pain prior to tx. Session.  No new complaints.    Manual tx.:  Seated/ prone STM to B UT (R>L)/ mid-thoracic paraspinals.      Prone TrP release technique to L/R UT region.     Supine STM/ stretches on bilateral cervical paraspinals, levator scap, upper trap.  Supine cervical traction/ suboccipital release technique in supine 3x with holds.  Supine L/R cervical rotn. Stretches with holds (pain tolerable range).     Application of Biofreeze to neck/ upper back in prone position after tx. Session.     Reassessment of seated thoracic rotn. After tx. (Pain reported with light OP).    PATIENT EDUCATION:  Education details: Discussed POC/ posture Person educated: Patient Education method: Medical illustrator Education comprehension: verbalized understanding and returned demonstration  HOME EXERCISE PROGRAM: Reviewed HEP  ASSESSMENT:  CLINICAL IMPRESSION: Pt. Reports persistent neck pain (3-4/10)/ tightness/ tension with h/o headaches.  Pt. Presents with pain limited cervical AROM and generalized B UE muscle weakness.  Forward head/ rounded shoulder posture in sitting/ standing.  Pt. Has (+) tenderness over R upper cervical paraspinals/ L UT trigger point with palpation.  Pt. Instructed in posture correction and discussed benefits of posture/ UE strengthening.  Pt. Will benefit from skilled PT services 1x/week to manage cervical limitations to improve pain-free mobility with daily/ work-related tasks.   OBJECTIVE IMPAIRMENTS: decreased mobility, decreased ROM, decreased strength, hypomobility, impaired flexibility, impaired UE functional use, improper body mechanics, postural dysfunction, and pain.   ACTIVITY LIMITATIONS: carrying, lifting, reach over head, hygiene/grooming, and caring for others  PARTICIPATION LIMITATIONS: meal prep, cleaning, laundry, driving, shopping, community activity, and occupation  PERSONAL FACTORS: Fitness and Past/current experiences are also affecting patient's functional outcome.   REHAB POTENTIAL: Good  CLINICAL DECISION MAKING: Evolving/moderate complexity  EVALUATION  COMPLEXITY: Moderate   GOALS: Goals reviewed with patient? Yes  SHORT TERM GOALS: Target date: 09/08/23  Pt. Independent with HEP to increase cervical AROM 25% to improve pain-free mobility.   Baseline:  see above Goal status: Partially met  2.  Pt. Will demonstrate proper seated upright posture to decrease neck pain with work-related tasks.  Baseline:  forward head, rounded sh. Posture.  Goal status: Not met  LONG TERM GOALS: Target date: 11/10/23  Pt. Will decrease NDI to <30% to improve pain-free mobility with daily tasks.  Baseline: 46% Goal status: INITIAL  2.  Pt. Will report no UE radicular symptoms/ tingling in hand with daily household/ work-related tasks to improve pain-free mobility.   Baseline:  R hand tingling Goal status: INITIAL  3.  Pt. Will report minimal B UT trigger point tenderness to improve pain-free mobility.   Baseline: (+) tenderness Goal status: INITIAL  PLAN:  PT FREQUENCY: 1-2x/week  PT DURATION: 12 weeks  PLANNED INTERVENTIONS: 97750- Physical Performance Testing, 97110-Therapeutic exercises, 97530- Therapeutic activity, [id number]- Neuromuscular re-education, 97535- Self Care, 02859- Manual therapy, G0283- Electrical stimulation (unattended), [id number]- Electrical stimulation (manual), 20560 (1-2 muscles), 20561 (3+ muscles)- Dry Needling, Patient/Family education, Joint mobilization, Spinal mobilization, Cryotherapy, and Moist heat  PLAN FOR NEXT SESSION: Reassess cervical AROM/ UT resisted ex. With proper upright posture.   Amanda Humphrey, PT, DPT # [phone] 10/03/2023, 5:50 PM

## 2023-10-08 ENCOUNTER — Ambulatory Visit: Admitting: Physical Therapy

## 2023-10-08 ENCOUNTER — Other Ambulatory Visit: Payer: Self-pay | Admitting: Internal Medicine

## 2023-10-08 DIAGNOSIS — M2560 Stiffness of unspecified joint, not elsewhere classified: Secondary | ICD-10-CM | POA: Diagnosis not present

## 2023-10-08 DIAGNOSIS — M542 Cervicalgia: Secondary | ICD-10-CM | POA: Diagnosis not present

## 2023-10-08 DIAGNOSIS — M79601 Pain in right arm: Secondary | ICD-10-CM

## 2023-10-08 DIAGNOSIS — M4692 Unspecified inflammatory spondylopathy, cervical region: Secondary | ICD-10-CM

## 2023-10-08 DIAGNOSIS — M6281 Muscle weakness (generalized): Secondary | ICD-10-CM

## 2023-10-15 ENCOUNTER — Ambulatory Visit: Admitting: Physical Therapy

## 2023-10-15 DIAGNOSIS — M2560 Stiffness of unspecified joint, not elsewhere classified: Secondary | ICD-10-CM

## 2023-10-15 DIAGNOSIS — M6281 Muscle weakness (generalized): Secondary | ICD-10-CM

## 2023-10-15 DIAGNOSIS — M542 Cervicalgia: Secondary | ICD-10-CM

## 2023-10-15 DIAGNOSIS — M79601 Pain in right arm: Secondary | ICD-10-CM | POA: Diagnosis not present

## 2023-10-16 ENCOUNTER — Encounter: Payer: Self-pay | Admitting: Physical Therapy

## 2023-10-16 NOTE — Therapy (Signed)
 OUTPATIENT PHYSICAL THERAPY CERVICAL TREATMENT  Patient Name: Amanda Humphrey MRN: [id number] DOB:Mar 05, 1965, 58 y.o., female Today's Date: 10/08/2023  PCP: Marylynn Verneita CROME, MD REFERRING PROVIDER: Marylynn Verneita CROME, MD   PT End of Session - 10/16/23 0912     Visit Number 6    Number of Visits 12    Date for Recertification  11/10/23    PT Start Time 0939    PT Stop Time 1025    PT Time Calculation (min) 46 min    Activity Tolerance Patient tolerated treatment well    Behavior During Therapy [facility] for tasks assessed/performed         Past Medical History:  Diagnosis Date   Allergy    Arthritis    Chronic kidney disease    stones   Fibrocystic breast disease 2013   GERD (gastroesophageal reflux disease)    Hyperlipidemia    Hypertension    Meniere disease    Migraines    Neuromuscular disorder (HCC) 2019   Past Surgical History:  Procedure Laterality Date   ABDOMINAL HYSTERECTOMY  2011   laprascopic supracervical, [place]   APPENDECTOMY  1999   BREAST BIOPSY Right 11/2010    fibrocystic disease, [place]   CESAREAN SECTION     4 total   CHOLECYSTECTOMY  2011   SPINE SURGERY  2021   TONSILLECTOMY AND ADENOIDECTOMY  1975   TUBAL LIGATION     Patient Active Problem List   Diagnosis Date Noted   Statin myopathy 05/20/2023   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   H/O of hemilaminectomy 02/15/2019   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   Fatigue 09/23/2017   History of pneumonia 04/30/2016   Grief reaction 04/30/2016   Vitamin D  deficiency 02/22/2015   Hyperlipidemia 01/01/2015   Visit for preventive health examination 12/04/2013   Lower extremity edema 09/02/2013   Essential hypertension 09/02/2013   History of cardiomyopathy 09/02/2013   SVT (supraventricular tachycardia) (HCC) 09/02/2013   Contrast media allergy 03/10/2013   Fibrocystic breast disease    Morbid obesity (HCC) 02/05/2012   Meniere disease     PCP: Marylynn Verneita CROME,  MD  REFERRING PROVIDER: Marylynn Verneita CROME, MD  REFERRING DIAG: [phone] (ICD-10-CM) - Chronic neck pain   THERAPY DIAG:  Neck pain  Joint stiffness of spine  Pain in right arm  Muscle weakness (generalized)  Rationale for Evaluation and Treatment: Rehabilitation  ONSET DATE: chronic  SUBJECTIVE:  SUBJECTIVE STATEMENT: Pt. Well known to PT clinic for chronic neck pain (see previous PT notes).  Pt. Reports 3-4/10 neck pain (L and R C3-4 region).  Pt. Describes pain as sharp in R c-spine (6/10) and c/o 9/10 pain at worst with residual HA.  Pt. States HA have been daily in occipital region (burning, stabbing).   Pt. C/o persistent tension in neck and has not participated with PT in several months due to work/ family/ schedule.  Pt. Has tingling sensation in R hand and c/o muscle contraction in L hand. Hand dominance: Right  PERTINENT HISTORY:  See MD/ PT notes.    PAIN:  Are you having pain? Yes: NPRS scale: 3/10  Pain location: cervical paraspinals/ UT musculature Pain description: achy/ tender Aggravating factors: prolonged position/ repeated movement Relieving factors: rest/ massage  PRECAUTIONS: Cervical  RED FLAGS: None    WEIGHT BEARING RESTRICTIONS: No  FALLS:  Has patient fallen in last 6 months? 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    NEXT MD VISIT: PRN  OBJECTIVE:  Note: Objective measures were completed at Evaluation unless otherwise noted.  DIAGNOSTIC FINDINGS:  See MD notes  PATIENT SURVEYS:  NDI: 46% self-perceived moderate  disability.    COGNITION: Overall cognitive status: Within functional limits for tasks assessed  SENSATION: WFL  POSTURE: rounded shoulders and forward head  PALPATION: (+) tenderness   CERVICAL ROM:   Active ROM A/PROM (deg) eval  Flexion 51 deg.  Extension 28 deg.  Right lateral flexion 60 deg.  Left lateral flexion 58 deg.  Right rotation 37 deg.  Left rotation 33 deg. (Pain)   (Blank rows = not tested)  UPPER EXTREMITY ROM:  Active ROM Right eval Left eval  Shoulder flexion [facility]  Shoulder extension    Shoulder abduction [facility]  Shoulder adduction    Shoulder extension    Shoulder internal rotation [facility]  Shoulder external rotation [facility]  Elbow flexion    Elbow extension    Wrist flexion    Wrist extension    Wrist ulnar deviation    Wrist radial deviation    Wrist pronation    Wrist supination     (Blank rows = not tested)  UPPER EXTREMITY MMT:  MMT Right eval Left eval  Shoulder flexion 4- 4-  Shoulder extension    Shoulder abduction 4- 4-  Shoulder adduction    Shoulder extension    Shoulder internal rotation 4 4  Shoulder external rotation 4 4  Middle trapezius    Lower trapezius    Elbow flexion 4+ 4+  Elbow extension 5 5  Wrist flexion    Wrist extension    Wrist ulnar deviation    Wrist radial deviation    Wrist pronation    Wrist supination    Grip strength     (Blank rows = not tested)  CERVICAL SPECIAL TESTS:  NT  FUNCTIONAL TESTS:  NT  TREATMENT DATE: 10/08/23  Subjective:  Pt. Continues to c/o B suboccipital (R>L)/ mid-cervical discomfort with rotn. And lateral flexion.  Pt. C/o 3-4/10 neck pain prior to tx. Session.  Pt. Remains busy with work-related tasks and has work trip to [facility] next Friday.    Manual tx.:  Supine STM/ stretches on bilateral cervical paraspinals, levator scap,  upper trap.  Supine cervical traction/ suboccipital release technique in supine 3x with holds.  Supine L/R cervical rotn. Stretches with holds (pain tolerable range).   Supine L/R shoulder flexion with OP and reassessment of UT and posterior deltoid tightness.   Seated/ prone STM to B UT (R>L)/ mid-thoracic paraspinals.     Prone TrP release technique to L/R UT region.     Application of Biofreeze to neck/ upper back in prone position after tx. Session.     Discussed upcoming trip to [facility].    PATIENT EDUCATION:  Education details: Discussed POC/ posture Person educated: Patient Education method: Medical illustrator Education comprehension: verbalized understanding and returned demonstration  HOME EXERCISE PROGRAM: Reviewed HEP  ASSESSMENT:  CLINICAL IMPRESSION: Pt. Reports persistent neck pain (3-4/10)/ tightness/ tension with h/o headaches.  Pt. Presents with pain limited cervical AROM and generalized B UE muscle weakness.  Forward head/ rounded shoulder posture in sitting/ standing.  Pt. Has (+) tenderness over R upper cervical paraspinals and suboccipital region.  Knot rolls under fingers during STM at R C3-5 region.  Pt. Instructed in posture correction and discussed benefits of posture/ UE strengthening.  Pt. Will benefit from skilled PT services 1x/week to manage cervical limitations to improve pain-free mobility with daily/ work-related tasks.   OBJECTIVE IMPAIRMENTS: decreased mobility, decreased ROM, decreased strength, hypomobility, impaired flexibility, impaired UE functional use, improper body mechanics, postural dysfunction, and pain.   ACTIVITY LIMITATIONS: carrying, lifting, reach over head, hygiene/grooming, and caring for others  PARTICIPATION LIMITATIONS: meal prep, cleaning, laundry, driving, shopping, community activity, and occupation  PERSONAL FACTORS: Fitness and Past/current experiences are also affecting patient's functional outcome.   REHAB  POTENTIAL: Good  CLINICAL DECISION MAKING: Evolving/moderate complexity  EVALUATION COMPLEXITY: Moderate   GOALS: Goals reviewed with patient? Yes  SHORT TERM GOALS: Target date: 09/08/23  Pt. Independent with HEP to increase cervical AROM 25% to improve pain-free mobility.   Baseline:  see above Goal status: Partially met  2.  Pt. Will demonstrate proper seated upright posture to decrease neck pain with work-related tasks.  Baseline:  forward head, rounded sh. Posture.  Goal status: Not met  LONG TERM GOALS: Target date: 11/10/23  Pt. Will decrease NDI to <30% to improve pain-free mobility with daily tasks.  Baseline: 46% Goal status: INITIAL  2.  Pt. Will report no UE radicular symptoms/ tingling in hand with daily household/ work-related tasks to improve pain-free mobility.   Baseline:  R hand tingling Goal status: INITIAL  3.  Pt. Will report minimal B UT trigger point tenderness to improve pain-free mobility.   Baseline: (+) tenderness Goal status: INITIAL  PLAN:  PT FREQUENCY: 1-2x/week  PT DURATION: 12 weeks  PLANNED INTERVENTIONS: 97750- Physical Performance Testing, 97110-Therapeutic exercises, 97530- Therapeutic activity, [id number]- Neuromuscular re-education, 97535- Self Care, 02859- Manual therapy, G0283- Electrical stimulation (unattended), [id number]- Electrical stimulation (manual), 20560 (1-2 muscles), 20561 (3+ muscles)- Dry Needling, Patient/Family education, Joint mobilization, Spinal mobilization, Cryotherapy, and Moist heat  PLAN FOR NEXT SESSION: Reassess cervical AROM/ UT resisted ex. With proper upright posture.   Ozell JAYSON Sero, PT, DPT # [phone] 10/16/2023, 9:13 AM

## 2023-10-16 NOTE — Therapy (Signed)
 OUTPATIENT PHYSICAL THERAPY CERVICAL TREATMENT  Patient Name: Amanda Humphrey MRN: [id number] DOB:1965-08-16, 58 y.o., female Today's Date: 10/15/2023  PCP: Marylynn Verneita CROME, MD REFERRING PROVIDER: Marylynn Verneita CROME, MD   PT End of Session - 10/16/23 0947     Visit Number 7    Number of Visits 12    Date for Recertification  11/10/23    PT Start Time 0945    PT Stop Time 1017    PT Time Calculation (min) 32 min    Activity Tolerance Patient tolerated treatment well    Behavior During Therapy [facility] for tasks assessed/performed         Past Medical History:  Diagnosis Date   Allergy    Arthritis    Chronic kidney disease    stones   Fibrocystic breast disease 2013   GERD (gastroesophageal reflux disease)    Hyperlipidemia    Hypertension    Meniere disease    Migraines    Neuromuscular disorder (HCC) 2019   Past Surgical History:  Procedure Laterality Date   ABDOMINAL HYSTERECTOMY  2011   laprascopic supracervical, [place]   APPENDECTOMY  1999   BREAST BIOPSY Right 11/2010    fibrocystic disease, [place]   CESAREAN SECTION     4 total   CHOLECYSTECTOMY  2011   SPINE SURGERY  2021   TONSILLECTOMY AND ADENOIDECTOMY  1975   TUBAL LIGATION     Patient Active Problem List   Diagnosis Date Noted   Statin myopathy 05/20/2023   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   H/O of hemilaminectomy 02/15/2019   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   Fatigue 09/23/2017   History of pneumonia 04/30/2016   Grief reaction 04/30/2016   Vitamin D  deficiency 02/22/2015   Hyperlipidemia 01/01/2015   Visit for preventive health examination 12/04/2013   Lower extremity edema 09/02/2013   Essential hypertension 09/02/2013   History of cardiomyopathy 09/02/2013   SVT (supraventricular tachycardia) (HCC) 09/02/2013   Contrast media allergy 03/10/2013   Fibrocystic breast disease    Morbid obesity (HCC) 02/05/2012   Meniere disease     PCP: Marylynn Verneita CROME,  MD  REFERRING PROVIDER: Marylynn Verneita CROME, MD  REFERRING DIAG: [phone] (ICD-10-CM) - Chronic neck pain   THERAPY DIAG:  Neck pain  Joint stiffness of spine  Pain in right arm  Muscle weakness (generalized)  Rationale for Evaluation and Treatment: Rehabilitation  ONSET DATE: chronic  SUBJECTIVE:  SUBJECTIVE STATEMENT: Pt. Well known to PT clinic for chronic neck pain (see previous PT notes).  Pt. Reports 3-4/10 neck pain (L and R C3-4 region).  Pt. Describes pain as sharp in R c-spine (6/10) and c/o 9/10 pain at worst with residual HA.  Pt. States HA have been daily in occipital region (burning, stabbing).   Pt. C/o persistent tension in neck and has not participated with PT in several months due to work/ family/ schedule.  Pt. Has tingling sensation in R hand and c/o muscle contraction in L hand. Hand dominance: Right  PERTINENT HISTORY:  See MD/ PT notes.    PAIN:  Are you having pain? Yes: NPRS scale: 3/10  Pain location: cervical paraspinals/ UT musculature Pain description: achy/ tender Aggravating factors: prolonged position/ repeated movement Relieving factors: rest/ massage  PRECAUTIONS: Cervical  RED FLAGS: None    WEIGHT BEARING RESTRICTIONS: No  FALLS:  Has patient fallen in last 6 months? 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    NEXT MD VISIT: PRN  OBJECTIVE:  Note: Objective measures were completed at Evaluation unless otherwise noted.  DIAGNOSTIC FINDINGS:  See MD notes  PATIENT SURVEYS:  NDI: 46% self-perceived moderate  disability.    COGNITION: Overall cognitive status: Within functional limits for tasks assessed  SENSATION: WFL  POSTURE: rounded shoulders and forward head  PALPATION: (+) tenderness   CERVICAL ROM:   Active ROM A/PROM (deg) eval  Flexion 51 deg.  Extension 28 deg.  Right lateral flexion 60 deg.  Left lateral flexion 58 deg.  Right rotation 37 deg.  Left rotation 33 deg. (Pain)   (Blank rows = not tested)  UPPER EXTREMITY ROM:  Active ROM Right eval Left eval  Shoulder flexion [facility]  Shoulder extension    Shoulder abduction [facility]  Shoulder adduction    Shoulder extension    Shoulder internal rotation [facility]  Shoulder external rotation [facility]  Elbow flexion    Elbow extension    Wrist flexion    Wrist extension    Wrist ulnar deviation    Wrist radial deviation    Wrist pronation    Wrist supination     (Blank rows = not tested)  UPPER EXTREMITY MMT:  MMT Right eval Left eval  Shoulder flexion 4- 4-  Shoulder extension    Shoulder abduction 4- 4-  Shoulder adduction    Shoulder extension    Shoulder internal rotation 4 4  Shoulder external rotation 4 4  Middle trapezius    Lower trapezius    Elbow flexion 4+ 4+  Elbow extension 5 5  Wrist flexion    Wrist extension    Wrist ulnar deviation    Wrist radial deviation    Wrist pronation    Wrist supination    Grip strength     (Blank rows = not tested)  CERVICAL SPECIAL TESTS:  NT  FUNCTIONAL TESTS:  NT  TREATMENT DATE: 10/15/23  Subjective:  Pt. Continues to c/o B suboccipital (R>L)/ mid-cervical discomfort with rotn. And lateral flexion.  Pt. C/o 3-4/10 neck pain prior to tx. Session.  Pt. Remains busy with work-related tasks and has work trip to [facility] next Friday.    Manual tx.:  Supine STM/ stretches on bilateral cervical paraspinals, levator scap,  upper trap.  Supine cervical traction/ suboccipital release technique in supine 3x with holds.  Supine L/R cervical rotn. Stretches with holds (pain tolerable range).   Supine L/R shoulder flexion with OP and reassessment of UT and posterior deltoid tightness.   Seated/ prone STM to B UT (R>L)/ mid-thoracic paraspinals.     Prone TrP release technique to L/R UT region.     Application of Biofreeze to neck/ upper back in prone position after tx. Session.     Discussed upcoming trip to [facility].    PATIENT EDUCATION:  Education details: Discussed POC/ posture Person educated: Patient Education method: Medical illustrator Education comprehension: verbalized understanding and returned demonstration  HOME EXERCISE PROGRAM: Reviewed HEP  ASSESSMENT:  CLINICAL IMPRESSION: Pt. Reports persistent neck pain (3-4/10)/ tightness/ tension with h/o headaches.  Pt. Presents with pain limited cervical AROM and generalized B UE muscle weakness.  Forward head/ rounded shoulder posture in sitting/ standing.  Pt. Has (+) tenderness over R upper cervical paraspinals and suboccipital region.  Knot rolls under fingers during STM at R C3-5 region.  Pt. Instructed in posture correction and discussed benefits of posture/ UE strengthening.  Pt. Will benefit from skilled PT services 1x/week to manage cervical limitations to improve pain-free mobility with daily/ work-related tasks.   OBJECTIVE IMPAIRMENTS: decreased mobility, decreased ROM, decreased strength, hypomobility, impaired flexibility, impaired UE functional use, improper body mechanics, postural dysfunction, and pain.   ACTIVITY LIMITATIONS: carrying, lifting, reach over head, hygiene/grooming, and caring for others  PARTICIPATION LIMITATIONS: meal prep, cleaning, laundry, driving, shopping, community activity, and occupation  PERSONAL FACTORS: Fitness and Past/current experiences are also affecting patient's functional outcome.   REHAB  POTENTIAL: Good  CLINICAL DECISION MAKING: Evolving/moderate complexity  EVALUATION COMPLEXITY: Moderate   GOALS: Goals reviewed with patient? Yes  SHORT TERM GOALS: Target date: 09/08/23  Pt. Independent with HEP to increase cervical AROM 25% to improve pain-free mobility.   Baseline:  see above Goal status: Partially met  2.  Pt. Will demonstrate proper seated upright posture to decrease neck pain with work-related tasks.  Baseline:  forward head, rounded sh. Posture.  Goal status: Not met  LONG TERM GOALS: Target date: 11/10/23  Pt. Will decrease NDI to <30% to improve pain-free mobility with daily tasks.  Baseline: 46% Goal status: INITIAL  2.  Pt. Will report no UE radicular symptoms/ tingling in hand with daily household/ work-related tasks to improve pain-free mobility.   Baseline:  R hand tingling Goal status: INITIAL  3.  Pt. Will report minimal B UT trigger point tenderness to improve pain-free mobility.   Baseline: (+) tenderness Goal status: INITIAL  PLAN:  PT FREQUENCY: 1-2x/week  PT DURATION: 12 weeks  PLANNED INTERVENTIONS: 97750- Physical Performance Testing, 97110-Therapeutic exercises, 97530- Therapeutic activity, [id number]- Neuromuscular re-education, 97535- Self Care, 02859- Manual therapy, G0283- Electrical stimulation (unattended), [id number]- Electrical stimulation (manual), 20560 (1-2 muscles), 20561 (3+ muscles)- Dry Needling, Patient/Family education, Joint mobilization, Spinal mobilization, Cryotherapy, and Moist heat  PLAN FOR NEXT SESSION: Reassess cervical AROM/ UT resisted ex. With proper upright posture.   Ozell JAYSON Sero, PT, DPT # [phone] 10/16/2023, 9:49 AM

## 2023-11-03 ENCOUNTER — Ambulatory Visit
Admission: RE | Admit: 2023-11-03 | Discharge: 2023-11-03 | Disposition: A | Discharge status: Home / Self Care | Source: Ambulatory Visit | Attending: Emergency Medicine | Admitting: Emergency Medicine

## 2023-11-03 VITALS — BP 132/83 | HR 75 | Temp 98.3°F | Resp 18

## 2023-11-03 DIAGNOSIS — R051 Acute cough: Secondary | ICD-10-CM

## 2023-11-03 DIAGNOSIS — J3489 Other specified disorders of nose and nasal sinuses: Secondary | ICD-10-CM | POA: Diagnosis not present

## 2023-11-03 DIAGNOSIS — J22 Unspecified acute lower respiratory infection: Secondary | ICD-10-CM

## 2023-11-03 MED ORDER — DOXYCYCLINE HYCLATE 100 MG PO CAPS: 100.0000 mg | ORAL_CAPSULE | Freq: Two times a day (BID) | ORAL | 0 refills | Status: AC

## 2023-11-03 MED ORDER — PROMETHAZINE-DM 6.25-15 MG/5ML PO SYRP: 5.0000 mL | ORAL_SOLUTION | Freq: Four times a day (QID) | ORAL | 0 refills | Status: AC | PRN

## 2023-11-03 MED ORDER — ALBUTEROL SULFATE HFA 108 (90 BASE) MCG/ACT IN AERS: 2.0000 | INHALATION_SPRAY | RESPIRATORY_TRACT | 0 refills | Status: AC | PRN

## 2023-11-03 MED ORDER — ALBUTEROL SULFATE HFA 108 (90 BASE) MCG/ACT IN AERS: 2.0000 | INHALATION_SPRAY | RESPIRATORY_TRACT | 0 refills | Status: DC | PRN

## 2023-11-03 NOTE — ED Triage Notes (Signed)
 Pt presents with a dry cough, facial pain, nasal congestion and chest congestion and crackling in her chest x 4 days. Pt has taken OTC cold medication.

## 2023-11-03 NOTE — Discharge Instructions (Addendum)
 Take antibiotic and albuterol , cough med as directed. Rest,push fluids. May use OTC meds of choice( tylenol , chloraseptic, etc) as label directed. Follow up with PCP in 3 days if no improvement,sooner if worse.

## 2023-11-03 NOTE — ED Provider Notes (Signed)
 [facility]    CSN: [id number] Arrival date & time: 11/03/23  0825      History   Chief Complaint Chief Complaint  Patient presents with   Nasal Congestion   Facial Pain    HPI RION SCHNITZER is a 58 y.o. female.   58 year old female, Elexius Minar, presents to urgent care for evaluation of cough, nasal congestion and crackling in chest x 4 days, pain with cough.   Has been taking Sudafed, mucinex DM, chlorapheneramine, tylenol , 3 negative covid tests at home. Recently returned form conference, airplane travel, unknown illness exposure.  The history is provided by the patient. No language interpreter was used.    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   H/O of hemilaminectomy 02/15/2019   Cervical spondylitis with radiculitis 11/17/2018   Fatigue 09/23/2017   History of pneumonia 04/30/2016   Grief reaction 04/30/2016   Vitamin D  deficiency 02/22/2015   Hyperlipidemia 01/01/2015   Visit for preventive health examination 12/04/2013   Lower extremity edema 09/02/2013   Essential hypertension 09/02/2013   History of cardiomyopathy 09/02/2013   SVT (supraventricular tachycardia) 09/02/2013   Contrast media allergy 03/10/2013   Fibrocystic breast disease    Morbid obesity (HCC) 02/05/2012   Meniere disease     Past Surgical History:  Procedure Laterality Date   ABDOMINAL HYSTERECTOMY  2011   laprascopic supracervical, [place]   APPENDECTOMY  1999   BREAST BIOPSY Right 11/2010    fibrocystic  disease, [place]   CESAREAN SECTION     4 total   CHOLECYSTECTOMY  2011   SPINE SURGERY  2021   TONSILLECTOMY AND ADENOIDECTOMY  1975   TUBAL LIGATION      OB History   No obstetric history on file.      Home Medications    Prior to Admission medications   Medication Sig Start Date End Date Taking? Authorizing Provider  albuterol  (VENTOLIN  HFA) 108 (90 Base) MCG/ACT inhaler Inhale 2 puffs into the lungs every 4 (four) hours as needed for wheezing or shortness of breath. 11/03/23  Yes Nathaniel Wakeley, Rilla, NP  doxycycline  (VIBRAMYCIN ) 100 MG capsule Take 1 capsule (100 mg total) by mouth 2 (two) times daily for 7 days. 11/03/23 11/10/23 Yes Tanise Russman, Rilla, NP  promethazine -dextromethorphan (PROMETHAZINE -DM) 6.25-15 MG/5ML syrup Take 5 mLs by mouth 4 (four) times daily as needed for cough. 11/03/23  Yes Areta Terwilliger, NP  amphetamine-dextroamphetamine (ADDERALL) 20 MG tablet Take 20 mg by mouth every evening.  09/22/18   [provider]  carvedilol  (COREG ) 6.25 MG tablet TAKE 1 TABLET TWICE A DAY (PLEASE CALL OUR OFFICE AND SCHEDULE YEARLY APPOINTMENT FOR FURTHER REFILLS) 02/25/23   Gollan, Timothy J, MD  cyclobenzaprine  (FLEXERIL ) 10 MG tablet Take 1 tablet (10 mg total) by mouth at bedtime. 05/20/23   Marylynn Verneita CROME, MD  Evolocumab  (REPATHA  SURECLICK) 140 MG/ML SOAJ Inject 140 mg into the skin every 14 (fourteen) days. Patient not taking: Reported on  05/20/2023 12/02/22   Gollan, Timothy J, MD  ezetimibe  (ZETIA ) 10 MG tablet Take 1 tablet (10 mg total) by mouth daily. 12/02/22   Gollan, Timothy J, MD  fluticasone  (FLONASE ) 50 MCG/ACT nasal spray Place 2 sprays into both nostrils daily. 05/18/23   Van Knee, MD  furosemide  (LASIX ) 20 MG tablet TAKE 1 TABLET DAILY AS NEEDED 02/25/23   Gollan, Timothy J, MD  hydrochlorothiazide  (HYDRODIURIL ) 25 MG tablet TAKE 1 TABLET DAILY 02/25/23   Gollan, Timothy J, MD  ipratropium (ATROVENT ) 0.06 % nasal spray Place 2 sprays into both  nostrils 4 (four) times daily. 12/28/22   Bernardino Ditch, NP  methocarbamol  (ROBAXIN ) 500 MG tablet Take 1 tablet (500 mg total) by mouth every 8 (eight) hours as needed for muscle spasms. 05/20/23   Marylynn Verneita CROME, MD  omeprazole  (PRILOSEC) 40 MG capsule TAKE 1 CAPSULE DAILY 10/08/23   Marylynn Verneita CROME, MD    Family History Family History  Problem Relation Age of Onset   Hypertension Mother    Cancer Mother        ovarian   Arthritis Mother    ADD / ADHD Mother    Hyperlipidemia Father    Hypertension Father    Diabetes Father    Stroke Father    Alzheimer's disease Maternal Grandmother    Heart disease Maternal Grandmother    Breast cancer Maternal Grandmother    Alzheimer's disease Paternal Grandmother    Breast cancer Paternal Grandmother     Social History Social History   Tobacco Use   Smoking status: Never   Smokeless tobacco: Never  Vaping Use   Vaping status: Never Used  Substance Use Topics   Alcohol use: Yes    Alcohol/week: 1.0 standard drink of alcohol    Types: 1 Glasses of wine per week    Comment: social   Drug use: No     Allergies   Bee venom, Codeine, Hydrocodone, Tramadol, and Oxycodone   Review of Systems Review of Systems  Constitutional:  Negative for fever.  HENT:  Positive for congestion, ear pain, postnasal drip, sinus pressure and sinus pain.   Respiratory:  Positive for cough and chest tightness. Negative for shortness of breath, wheezing and stridor.   Cardiovascular:  Negative for chest pain and palpitations.  Gastrointestinal:  Negative for diarrhea, nausea and vomiting.  All other systems reviewed and are negative.    Physical Exam Triage Vital Signs ED Triage Vitals  Encounter Vitals Group     BP 11/03/23 0858 132/83     Girls Systolic BP Percentile --      Girls Diastolic BP Percentile --      Boys Systolic BP Percentile --      Boys Diastolic BP Percentile --      Pulse Rate 11/03/23 0858 75     Resp 11/03/23 0858 18      Temp 11/03/23 0858 98.3 F (36.8 C)     Temp Source 11/03/23 0858 Oral     SpO2 11/03/23 0858 96 %     Weight --      Height --      Head Circumference --      Peak Flow --      Pain Score 11/03/23 0857 0     Pain Loc --      Pain Education --      Exclude from Growth Chart --    No data found.  Updated Vital Signs BP 132/83 (BP Location: Right Arm)  Pulse 75   Temp 98.3 F (36.8 C) (Oral)   Resp 18   SpO2 96%   Visual Acuity Right Eye Distance:   Left Eye Distance:   Bilateral Distance:    Right Eye Near:   Left Eye Near:    Bilateral Near:     Physical Exam Vitals and nursing note reviewed.  Constitutional:      General: She is not in acute distress.    Appearance: She is well-developed.  HENT:     Head: Normocephalic.     Right Ear: Tympanic membrane is retracted.     Left Ear: Tympanic membrane is retracted.     Nose: Mucosal edema and congestion present.     Right Sinus: Maxillary sinus tenderness present.     Left Sinus: Maxillary sinus tenderness present.     Mouth/Throat:     Lips: Pink.     Mouth: Mucous membranes are moist.     Pharynx: Oropharynx is clear. Uvula midline.  Eyes:     General: Lids are normal.     Conjunctiva/sclera: Conjunctivae normal.     Pupils: Pupils are equal, round, and reactive to light.  Neck:     Trachea: No tracheal deviation.  Cardiovascular:     Rate and Rhythm: Normal rate and regular rhythm.     Heart sounds: Normal heart sounds. No murmur heard. Pulmonary:     Effort: Pulmonary effort is normal.     Breath sounds: Normal breath sounds and air entry.  Abdominal:     General: Bowel sounds are normal.     Palpations: Abdomen is soft.     Tenderness: There is no abdominal tenderness.  Musculoskeletal:        General: Normal range of motion.     Cervical back: Normal range of motion.  Lymphadenopathy:     Cervical: No cervical adenopathy.  Skin:    General: Skin is warm and dry.     Findings: No rash.   Neurological:     General: No focal deficit present.     Mental Status: She is alert and oriented to person, place, and time.     GCS: GCS eye subscore is 4. GCS verbal subscore is 5. GCS motor subscore is 6.  Psychiatric:        Attention and Perception: Attention normal.        Mood and Affect: Mood normal.        Speech: Speech normal.        Behavior: Behavior normal. Behavior is cooperative.      UC Treatments / Results  Labs (all labs ordered are listed, but only abnormal results are displayed) Labs Reviewed - No data to display  EKG   Radiology No results found.  Procedures Procedures (including critical care time)  Medications Ordered in UC Medications - No data to display  Initial Impression / Assessment and Plan / UC Course  I have reviewed the triage vital signs and the nursing notes.  Pertinent labs & imaging results that were available during my care of the patient were reviewed by me and considered in my medical decision making (see chart for details).    Discussed exam findings and plan of care with patient, doxycycline , phenergan  DM, albuterol  inhaler scripted, strict go to ER precautions given.   Patient verbalized understanding to this provider.  Ddx: Acute respiratory infection, acute cough, sinus congestion, allergies, viral illness, pneumonia Final Clinical Impressions(s) / UC Diagnoses   Final diagnoses:  Acute  respiratory infection  Acute cough  Sinus pressure     Discharge Instructions      Take antibiotic and albuterol , cough med as directed. Rest,push fluids. May use OTC meds of choice( tylenol , chloraseptic, etc) as label directed. Follow up with PCP in 3 days if no improvement,sooner if worse.     ED Prescriptions     Medication Sig Dispense Auth. Provider   promethazine -dextromethorphan (PROMETHAZINE -DM) 6.25-15 MG/5ML syrup Take 5 mLs by mouth 4 (four) times daily as needed for cough. 118 mL Kalan Yeley, NP   albuterol   (VENTOLIN  HFA) 108 (90 Base) MCG/ACT inhaler Inhale 2 puffs into the lungs every 4 (four) hours as needed for wheezing or shortness of breath. 1 each Eros Montour, NP   doxycycline  (VIBRAMYCIN ) 100 MG capsule Take 1 capsule (100 mg total) by mouth 2 (two) times daily for 7 days. 14 capsule Sarin Comunale, NP      PDMP not reviewed this encounter.   Aminta Loose, NP 11/03/23 [phone]

## 2023-11-05 ENCOUNTER — Ambulatory Visit: Attending: Internal Medicine | Admitting: Physical Therapy

## 2023-11-05 DIAGNOSIS — M2560 Stiffness of unspecified joint, not elsewhere classified: Secondary | ICD-10-CM | POA: Diagnosis not present

## 2023-11-05 DIAGNOSIS — M542 Cervicalgia: Secondary | ICD-10-CM | POA: Insufficient documentation

## 2023-11-05 DIAGNOSIS — M6281 Muscle weakness (generalized): Secondary | ICD-10-CM | POA: Insufficient documentation

## 2023-11-05 DIAGNOSIS — M79601 Pain in right arm: Secondary | ICD-10-CM | POA: Diagnosis not present

## 2023-11-12 ENCOUNTER — Ambulatory Visit: Admitting: Physical Therapy

## 2023-11-12 DIAGNOSIS — M2560 Stiffness of unspecified joint, not elsewhere classified: Secondary | ICD-10-CM

## 2023-11-12 DIAGNOSIS — M542 Cervicalgia: Secondary | ICD-10-CM

## 2023-11-12 DIAGNOSIS — M6281 Muscle weakness (generalized): Secondary | ICD-10-CM | POA: Diagnosis not present

## 2023-11-12 DIAGNOSIS — M79601 Pain in right arm: Secondary | ICD-10-CM | POA: Diagnosis not present

## 2023-11-13 NOTE — Therapy (Signed)
 OUTPATIENT PHYSICAL THERAPY CERVICAL TREATMENT  Patient Name: Amanda Humphrey MRN: [id number] DOB:Oct 02, 1965, 58 y.o., female Today's Date: 11/05/2023  PCP: Marylynn Verneita CROME, MD REFERRING PROVIDER: Marylynn Verneita CROME, MD   PT End of Session - 11/13/23 1018     Visit Number 8    Number of Visits 12    Date for Recertification  11/10/23    PT Start Time 0942    PT Stop Time 1026    PT Time Calculation (min) 44 min    Activity Tolerance Patient tolerated treatment well    Behavior During Therapy [facility] for tasks assessed/performed         Past Medical History:  Diagnosis Date   Allergy    Arthritis    Chronic kidney disease    stones   Fibrocystic breast disease 2013   GERD (gastroesophageal reflux disease)    Hyperlipidemia    Hypertension    Meniere disease    Migraines    Neuromuscular disorder (HCC) 2019   Past Surgical History:  Procedure Laterality Date   ABDOMINAL HYSTERECTOMY  2011   laprascopic supracervical, [place]   APPENDECTOMY  1999   BREAST BIOPSY Right 11/2010    fibrocystic disease, [place]   CESAREAN SECTION     4 total   CHOLECYSTECTOMY  2011   SPINE SURGERY  2021   TONSILLECTOMY AND ADENOIDECTOMY  1975   TUBAL LIGATION     Patient Active Problem List   Diagnosis Date Noted   Acute respiratory infection 11/03/2023   Acute cough 11/03/2023   Sinus pressure 11/03/2023   Statin myopathy 05/20/2023   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   H/O of hemilaminectomy 02/15/2019   Cervical spondylitis with radiculitis 11/17/2018   Fatigue 09/23/2017   History of pneumonia 04/30/2016   Grief reaction 04/30/2016   Vitamin D  deficiency 02/22/2015   Hyperlipidemia 01/01/2015   Visit for preventive health examination 12/04/2013   Lower extremity edema 09/02/2013   Essential hypertension 09/02/2013   History of cardiomyopathy 09/02/2013   SVT (supraventricular tachycardia) 09/02/2013   Contrast media allergy 03/10/2013   Fibrocystic breast disease     Morbid obesity (HCC) 02/05/2012   Meniere disease     PCP: Marylynn Verneita CROME, MD  REFERRING PROVIDER: Marylynn Verneita CROME, MD  REFERRING DIAG: [phone] (ICD-10-CM) - Chronic neck pain   THERAPY DIAG:  Neck pain  Joint stiffness of spine  Pain in right arm  Muscle weakness (generalized)  Rationale for Evaluation and Treatment: Rehabilitation  ONSET DATE: chronic  SUBJECTIVE:  SUBJECTIVE STATEMENT: Pt. Well known to PT clinic for chronic neck pain (see previous PT notes).  Pt. Reports 3-4/10 neck pain (L and R C3-4 region).  Pt. Describes pain as sharp in R c-spine (6/10) and c/o 9/10 pain at worst with residual HA.  Pt. States HA have been daily in occipital region (burning, stabbing).   Pt. C/o persistent tension in neck and has not participated with PT in several months due to work/ family/ schedule.  Pt. Has tingling sensation in R hand and c/o muscle contraction in L hand. Hand dominance: Right  PERTINENT HISTORY:  See MD/ PT notes.    PAIN:  Are you having pain? Yes: NPRS scale: 3/10  Pain location: cervical paraspinals/ UT musculature Pain description: achy/ tender Aggravating factors: prolonged position/ repeated movement Relieving factors: rest/ massage  PRECAUTIONS: Cervical  RED FLAGS: None    WEIGHT BEARING RESTRICTIONS: No  FALLS:  Has patient fallen in last 6 months? 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    NEXT MD VISIT: PRN  OBJECTIVE:  Note: Objective measures were completed at Evaluation unless otherwise noted.  DIAGNOSTIC  FINDINGS:  See MD notes  PATIENT SURVEYS:  NDI: 46% self-perceived moderate disability.    COGNITION: Overall cognitive status: Within functional limits for tasks assessed  SENSATION: WFL  POSTURE: rounded shoulders and forward head  PALPATION: (+) tenderness   CERVICAL ROM:   Active ROM A/PROM (deg) eval  Flexion 51 deg.  Extension 28 deg.  Right lateral flexion 60 deg.  Left lateral flexion 58 deg.  Right rotation 37 deg.  Left rotation 33 deg. (Pain)   (Blank rows = not tested)  UPPER EXTREMITY ROM:  Active ROM Right eval Left eval  Shoulder flexion [facility]  Shoulder extension    Shoulder abduction [facility]  Shoulder adduction    Shoulder extension    Shoulder internal rotation [facility]  Shoulder external rotation [facility]  Elbow flexion    Elbow extension    Wrist flexion    Wrist extension    Wrist ulnar deviation    Wrist radial deviation    Wrist pronation    Wrist supination     (Blank rows = not tested)  UPPER EXTREMITY MMT:  MMT Right eval Left eval  Shoulder flexion 4- 4-  Shoulder extension    Shoulder abduction 4- 4-  Shoulder adduction    Shoulder extension    Shoulder internal rotation 4 4  Shoulder external rotation 4 4  Middle trapezius    Lower trapezius    Elbow flexion 4+ 4+  Elbow extension 5 5  Wrist flexion    Wrist extension    Wrist ulnar deviation    Wrist radial deviation    Wrist pronation    Wrist supination    Grip strength     (Blank rows = not tested)  CERVICAL SPECIAL TESTS:  NT  FUNCTIONAL TESTS:  NT  TREATMENT DATE: 11/05/23  Subjective:  Pt. Continues to c/o B suboccipital (R>L)/ mid-cervical discomfort with rotn. And lateral flexion.  Pt. C/o 3-4/10 neck pain prior to tx. Session.  Pt. Remains busy with work-related tasks and has work trip to [place]  [facility] next Friday.    Manual  tx.:  Supine STM/ stretches on bilateral cervical paraspinals, levator scap, upper trap.  Supine cervical traction/ suboccipital release technique in supine 3x with holds.  Supine L/R cervical rotn. Stretches with holds (pain tolerable range).   Supine L/R shoulder flexion with OP and reassessment of UT and posterior deltoid tightness.   Seated/ prone STM to B UT (R>L)/ mid-thoracic paraspinals.     Prone TrP release technique to L/R UT region.     Application of Biofreeze to neck/ upper back in prone position after tx. Session.     Discussed upcoming trip to [facility].    PATIENT EDUCATION:  Education details: Discussed POC/ posture Person educated: Patient Education method: Medical illustrator Education comprehension: verbalized understanding and returned demonstration  HOME EXERCISE PROGRAM: Reviewed HEP  ASSESSMENT:  CLINICAL IMPRESSION: Pt. Reports persistent neck pain (3-4/10)/ tightness/ tension with h/o headaches.  Pt. Presents with pain limited cervical AROM and generalized B UE muscle weakness.  Forward head/ rounded shoulder posture in sitting/ standing.  Pt. Has (+) tenderness over R upper cervical paraspinals and suboccipital region.  Knot rolls under fingers during STM at R C3-5 region.  Pt. Instructed in posture correction and discussed benefits of posture/ UE strengthening.  Pt. Will benefit from skilled PT services 1x/week to manage cervical limitations to improve pain-free mobility with daily/ work-related tasks.   OBJECTIVE IMPAIRMENTS: decreased mobility, decreased ROM, decreased strength, hypomobility, impaired flexibility, impaired UE functional use, improper body mechanics, postural dysfunction, and pain.   ACTIVITY LIMITATIONS: carrying, lifting, reach over head, hygiene/grooming, and caring for others  PARTICIPATION LIMITATIONS: meal prep, cleaning, laundry, driving, shopping, community activity, and occupation  PERSONAL FACTORS: Fitness and  Past/current experiences are also affecting patient's functional outcome.   REHAB POTENTIAL: Good  CLINICAL DECISION MAKING: Evolving/moderate complexity  EVALUATION COMPLEXITY: Moderate   GOALS: Goals reviewed with patient? Yes  SHORT TERM GOALS: Target date: 09/08/23  Pt. Independent with HEP to increase cervical AROM 25% to improve pain-free mobility.   Baseline:  see above Goal status: Partially met  2.  Pt. Will demonstrate proper seated upright posture to decrease neck pain with work-related tasks.  Baseline:  forward head, rounded sh. Posture.  Goal status: Not met  LONG TERM GOALS: Target date: 11/10/23  Pt. Will decrease NDI to <30% to improve pain-free mobility with daily tasks.  Baseline: 46% Goal status: INITIAL  2.  Pt. Will report no UE radicular symptoms/ tingling in hand with daily household/ work-related tasks to improve pain-free mobility.   Baseline:  R hand tingling Goal status: INITIAL  3.  Pt. Will report minimal B UT trigger point tenderness to improve pain-free mobility.   Baseline: (+) tenderness Goal status: INITIAL  PLAN:  PT FREQUENCY: 1-2x/week  PT DURATION: 12 weeks  PLANNED INTERVENTIONS: 97750- Physical Performance Testing, 97110-Therapeutic exercises, 97530- Therapeutic activity, [id number]- Neuromuscular re-education, 97535- Self Care, 02859- Manual therapy, [id number]- Electrical stimulation (unattended), [id number]- Electrical stimulation (manual), 20560 (1-2 muscles), 20561 (3+ muscles)- Dry Needling, Patient/Family education, Joint mobilization, Spinal mobilization, Cryotherapy, and Moist heat  PLAN FOR NEXT SESSION: Reassess cervical AROM/ UT resisted ex. With proper upright posture.   Ozell JAYSON Sero, PT, DPT # [phone] 11/13/2023, 10:19 AM

## 2023-11-14 ENCOUNTER — Encounter: Payer: Self-pay | Admitting: Physical Therapy

## 2023-11-14 NOTE — Therapy (Signed)
 OUTPATIENT PHYSICAL THERAPY CERVICAL TREATMENT/ RECERTIFICATION  Patient Name: Amanda Humphrey MRN: [id number] DOB:April 15, 1965, 58 y.o., female Today's Date: 11/12/2023  PCP: Marylynn Verneita CROME, MD REFERRING PROVIDER: Marylynn Verneita CROME, MD   PT End of Session - 11/14/23 1838     Visit Number 9    Number of Visits 17    Date for Recertification  01/07/24    PT Start Time 0947    PT Stop Time 1030    PT Time Calculation (min) 43 min    Activity Tolerance Patient tolerated treatment well    Behavior During Therapy [facility] for tasks assessed/performed         Past Medical History:  Diagnosis Date   Allergy    Arthritis    Chronic kidney disease    stones   Fibrocystic breast disease 2013   GERD (gastroesophageal reflux disease)    Hyperlipidemia    Hypertension    Meniere disease    Migraines    Neuromuscular disorder (HCC) 2019   Past Surgical History:  Procedure Laterality Date   ABDOMINAL HYSTERECTOMY  2011   laprascopic supracervical, [place]   APPENDECTOMY  1999   BREAST BIOPSY Right 11/2010    fibrocystic disease, [place]   CESAREAN SECTION     4 total   CHOLECYSTECTOMY  2011   SPINE SURGERY  2021   TONSILLECTOMY AND ADENOIDECTOMY  1975   TUBAL LIGATION     Patient Active Problem List   Diagnosis Date Noted   Acute respiratory infection 11/03/2023   Acute cough 11/03/2023   Sinus pressure 11/03/2023   Statin myopathy 05/20/2023   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   H/O of hemilaminectomy 02/15/2019   Cervical spondylitis with radiculitis 11/17/2018   Fatigue 09/23/2017   History of pneumonia 04/30/2016   Grief reaction 04/30/2016   Vitamin D  deficiency 02/22/2015   Hyperlipidemia 01/01/2015   Visit for preventive health examination 12/04/2013   Lower extremity edema 09/02/2013   Essential hypertension 09/02/2013   History of cardiomyopathy 09/02/2013   SVT (supraventricular tachycardia) 09/02/2013   Contrast media allergy 03/10/2013   Fibrocystic  breast disease    Morbid obesity (HCC) 02/05/2012   Meniere disease     PCP: Marylynn Verneita CROME, MD  REFERRING PROVIDER: Marylynn Verneita CROME, MD  REFERRING DIAG: [phone] (ICD-10-CM) - Chronic neck pain   THERAPY DIAG:  Neck pain  Joint stiffness of spine  Muscle weakness (generalized)  Rationale for Evaluation and Treatment: Rehabilitation  ONSET DATE: chronic  SUBJECTIVE:  SUBJECTIVE STATEMENT: Pt. Well known to PT clinic for chronic neck pain (see previous PT notes).  Pt. Reports 3-4/10 neck pain (L and R C3-4 region).  Pt. Describes pain as sharp in R c-spine (6/10) and c/o 9/10 pain at worst with residual HA.  Pt. States HA have been daily in occipital region (burning, stabbing).   Pt. C/o persistent tension in neck and has not participated with PT in several months due to work/ family/ schedule.  Pt. Has tingling sensation in R hand and c/o muscle contraction in L hand. Hand dominance: Right  PERTINENT HISTORY:  See MD/ PT notes.    PAIN:  Are you having pain? Yes: NPRS scale: 3/10  Pain location: cervical paraspinals/ UT musculature Pain description: achy/ tender Aggravating factors: prolonged position/ repeated movement Relieving factors: rest/ massage  PRECAUTIONS: Cervical  RED FLAGS: None    WEIGHT BEARING RESTRICTIONS: No  FALLS:  Has patient fallen in last 6 months? 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    NEXT MD VISIT: PRN  OBJECTIVE:  Note: Objective measures were completed at Evaluation unless otherwise noted.  DIAGNOSTIC  FINDINGS:  See MD notes  PATIENT SURVEYS:  NDI: 46% self-perceived moderate disability.    COGNITION: Overall cognitive status: Within functional limits for tasks assessed  SENSATION: WFL  POSTURE: rounded shoulders and forward head  PALPATION: (+) tenderness   CERVICAL ROM:   Active ROM A/PROM (deg) eval  Flexion 51 deg.  Extension 28 deg.  Right lateral flexion 60 deg.  Left lateral flexion 58 deg.  Right rotation 37 deg.  Left rotation 33 deg. (Pain)   (Blank rows = not tested)  UPPER EXTREMITY ROM:  Active ROM Right eval Left eval  Shoulder flexion [facility]  Shoulder extension    Shoulder abduction [facility]  Shoulder adduction    Shoulder extension    Shoulder internal rotation [facility]  Shoulder external rotation [facility]  Elbow flexion    Elbow extension    Wrist flexion    Wrist extension    Wrist ulnar deviation    Wrist radial deviation    Wrist pronation    Wrist supination     (Blank rows = not tested)  UPPER EXTREMITY MMT:  MMT Right eval Left eval  Shoulder flexion 4- 4-  Shoulder extension    Shoulder abduction 4- 4-  Shoulder adduction    Shoulder extension    Shoulder internal rotation 4 4  Shoulder external rotation 4 4  Middle trapezius    Lower trapezius    Elbow flexion 4+ 4+  Elbow extension 5 5  Wrist flexion    Wrist extension    Wrist ulnar deviation    Wrist radial deviation    Wrist pronation    Wrist supination    Grip strength     (Blank rows = not tested)  CERVICAL SPECIAL TESTS:  NT  FUNCTIONAL TESTS:  NT  TREATMENT DATE: 11/05/23  Subjective:  Pt. C/o 3/10 neck pain prior to tx. Session (R UT region).   No new complaints.  Discussed posture with daily tasks/ work.  Manual tx.:  Seated/ prone STM to B UT (R>L)/ mid-thoracic paraspinals.   Reassessment of goals.  Will complete NDI  next tx. Session.   Supine STM/ stretches on bilateral cervical paraspinals, levator scap, upper trap.  Supine cervical traction/ suboccipital release technique in supine 3x with holds.  Supine L/R cervical rotn. Stretches with holds (pain tolerable range).   Supine L/R shoulder flexion with OP and reassessment of UT and posterior deltoid tightness.   Prone TrP release technique to L/R UT region.  1 episode of catching in neck with L rotn.    Application of Biofreeze to neck/ upper back in prone position after tx. Session.     No change to HEP  PATIENT EDUCATION:  Education details: Discussed POC/ posture Person educated: Patient Education method: Medical illustrator Education comprehension: verbalized understanding and returned demonstration  HOME EXERCISE PROGRAM: Reviewed HEP  ASSESSMENT:  CLINICAL IMPRESSION: Pt. Reports persistent neck pain (3/10)/ tightness/ tension with no HA reported.  Pt. Presents with pain limited cervical AROM and generalized B UE muscle weakness.  Forward head/ rounded shoulder posture in sitting/ standing. Pt. Able to correct seated posture with verbal/ tactile cuing.   Pt. Has (+) tenderness over R upper cervical paraspinals and suboccipital region.  STM at R C3-5 region with generalized tightness/ tenderness noted.  Pt. Benefits from manual tx. To increase/ maintain cervical ROM in order to complete pain tolerable daily/ work-related tasks.  Pt. Will benefit from skilled PT services 1x/week to manage cervical limitations to improve pain-free mobility with daily/ work-related tasks.   OBJECTIVE IMPAIRMENTS: decreased mobility, decreased ROM, decreased strength, hypomobility, impaired flexibility, impaired UE functional use, improper body mechanics, postural dysfunction, and pain.   ACTIVITY LIMITATIONS: carrying, lifting, reach over head, hygiene/grooming, and caring for others  PARTICIPATION LIMITATIONS: meal prep, cleaning, laundry, driving,  shopping, community activity, and occupation  PERSONAL FACTORS: Fitness and Past/current experiences are also affecting patient's functional outcome.   REHAB POTENTIAL: Good  CLINICAL DECISION MAKING: Evolving/moderate complexity  EVALUATION COMPLEXITY: Moderate   GOALS: Goals reviewed with patient? Yes  SHORT TERM GOALS: Target date: 12/10/23  Pt. Independent with HEP to increase cervical AROM 25% to improve pain-free mobility.   Baseline:  see above Goal status: Partially met  2.  Pt. Will demonstrate proper seated upright posture to decrease neck pain with work-related tasks.  Baseline:  forward head, rounded sh. Posture.  Goal status: Not met  LONG TERM GOALS: Target date: 01/07/24  Pt. Will decrease NDI to <30% to improve pain-free mobility with daily tasks.  Baseline: 46% Goal status: On-going  2.  Pt. Will report no UE radicular symptoms/ tingling in hand with daily household/ work-related tasks to improve pain-free mobility.   Baseline:  R hand tingling (change in symptoms with activity). Goal status: Partially met  3.  Pt. Will report minimal B UT trigger point tenderness to improve pain-free mobility.   Baseline: (+) tenderness Goal status: Not met  PLAN:  PT FREQUENCY: 1-2x/week  PT DURATION: 8 weeks  PLANNED INTERVENTIONS: 97750- Physical Performance Testing, 97110-Therapeutic exercises, 97530- Therapeutic activity, [id number]- Neuromuscular re-education, 97535- Self Care, 02859- Manual therapy, [id number]- Electrical stimulation (unattended), [phone]- Electrical stimulation (manual), 20560 (1-2 muscles), 20561 (3+ muscles)- Dry Needling, Patient/Family education, Joint mobilization, Spinal mobilization, Cryotherapy, and Moist heat  PLAN FOR NEXT SESSION: Progress HEP/ 10th visit  progress note.  COMPLETE NDI  Ozell JAYSON Sero, PT, DPT # [phone] 11/14/2023, 6:40 PM

## 2023-11-17 ENCOUNTER — Ambulatory Visit: Admitting: Physical Therapy

## 2023-11-17 DIAGNOSIS — M542 Cervicalgia: Secondary | ICD-10-CM | POA: Diagnosis not present

## 2023-11-17 DIAGNOSIS — M79601 Pain in right arm: Secondary | ICD-10-CM | POA: Diagnosis not present

## 2023-11-17 DIAGNOSIS — M6281 Muscle weakness (generalized): Secondary | ICD-10-CM | POA: Diagnosis not present

## 2023-11-17 DIAGNOSIS — M2560 Stiffness of unspecified joint, not elsewhere classified: Secondary | ICD-10-CM

## 2023-11-24 ENCOUNTER — Ambulatory Visit: Admitting: Physical Therapy

## 2023-11-24 DIAGNOSIS — M542 Cervicalgia: Secondary | ICD-10-CM | POA: Diagnosis not present

## 2023-11-24 DIAGNOSIS — M79601 Pain in right arm: Secondary | ICD-10-CM | POA: Diagnosis not present

## 2023-11-24 DIAGNOSIS — M6281 Muscle weakness (generalized): Secondary | ICD-10-CM

## 2023-11-24 DIAGNOSIS — M2560 Stiffness of unspecified joint, not elsewhere classified: Secondary | ICD-10-CM | POA: Diagnosis not present

## 2023-11-27 NOTE — Therapy (Signed)
 OUTPATIENT PHYSICAL THERAPY CERVICAL TREATMENT  Patient Name: Amanda Humphrey MRN: [id number] DOB:[birth year], 58 y.o., female Today's Date: 11/24/2023  PCP: Marylynn Verneita CROME, MD REFERRING PROVIDER: Marylynn Verneita CROME, MD   PT End of Session - 11/27/23 0951     Visit Number 11    Number of Visits 17    Date for Recertification  01/07/24    PT Start Time 1121    PT Stop Time 1149    PT Time Calculation (min) 28 min    Activity Tolerance Patient tolerated treatment well    Behavior During Therapy [facility] for tasks assessed/performed         Past Medical History:  Diagnosis Date   Allergy    Arthritis    Chronic kidney disease    stones   Fibrocystic breast disease 2013   GERD (gastroesophageal reflux disease)    Hyperlipidemia    Hypertension    Meniere disease    Migraines    Neuromuscular disorder (HCC) 2019   Past Surgical History:  Procedure Laterality Date   ABDOMINAL HYSTERECTOMY  2011   laprascopic supracervical, [place]   APPENDECTOMY  1999   BREAST BIOPSY Right 11/2010    fibrocystic disease, [place]   CESAREAN SECTION     4 total   CHOLECYSTECTOMY  2011   SPINE SURGERY  2021   TONSILLECTOMY AND ADENOIDECTOMY  1975   TUBAL LIGATION     Patient Active Problem List   Diagnosis Date Noted   Acute respiratory infection 11/03/2023   Acute cough 11/03/2023   Sinus pressure 11/03/2023   Statin myopathy 05/20/2023   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   H/O of hemilaminectomy 02/15/2019   Cervical spondylitis with radiculitis 11/17/2018   Fatigue 09/23/2017   History of pneumonia 04/30/2016   Grief reaction 04/30/2016   Vitamin D  deficiency 02/22/2015   Hyperlipidemia 01/01/2015   Visit for preventive health examination 12/04/2013   Lower extremity edema 09/02/2013   Essential hypertension 09/02/2013   History of cardiomyopathy 09/02/2013   SVT (supraventricular tachycardia) 09/02/2013   Contrast media allergy 03/10/2013   Fibrocystic breast disease     Morbid obesity (HCC) 02/05/2012   Meniere disease     PCP: Marylynn Verneita CROME, MD  REFERRING PROVIDER: Marylynn Verneita CROME, MD  REFERRING DIAG: [phone] (ICD-10-CM) - Chronic neck pain   THERAPY DIAG:  Neck pain  Joint stiffness of spine  Muscle weakness (generalized)  Rationale for Evaluation and Treatment: Rehabilitation  ONSET DATE: chronic  SUBJECTIVE:  SUBJECTIVE STATEMENT: Pt. Well known to PT clinic for chronic neck pain (see previous PT notes).  Pt. Reports 3-4/10 neck pain (L and R C3-4 region).  Pt. Describes pain as sharp in R c-spine (6/10) and c/o 9/10 pain at worst with residual HA.  Pt. States HA have been daily in occipital region (burning, stabbing).   Pt. C/o persistent tension in neck and has not participated with PT in several months due to work/ family/ schedule.  Pt. Has tingling sensation in R hand and c/o muscle contraction in L hand. Hand dominance: Right  PERTINENT HISTORY:  See MD/ PT notes.    PAIN:  Are you having pain? Yes: NPRS scale: 3/10  Pain location: cervical paraspinals/ UT musculature Pain description: achy/ tender Aggravating factors: prolonged position/ repeated movement Relieving factors: rest/ massage  PRECAUTIONS: Cervical  RED FLAGS: None    WEIGHT BEARING RESTRICTIONS: No  FALLS:  Has patient fallen in last 6 months? 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    NEXT MD VISIT: PRN  OBJECTIVE:  Note: Objective measures were completed at Evaluation unless otherwise noted.  DIAGNOSTIC FINDINGS:  See MD  notes  PATIENT SURVEYS:  NDI: 46% self-perceived moderate disability.    COGNITION: Overall cognitive status: Within functional limits for tasks assessed  SENSATION: WFL  POSTURE: rounded shoulders and forward head  PALPATION: (+) tenderness   CERVICAL ROM:   Active ROM A/PROM (deg) eval  Flexion 51 deg.  Extension 28 deg.  Right lateral flexion 60 deg.  Left lateral flexion 58 deg.  Right rotation 37 deg.  Left rotation 33 deg. (Pain)   (Blank rows = not tested)  UPPER EXTREMITY ROM:  Active ROM Right eval Left eval  Shoulder flexion [facility]  Shoulder extension    Shoulder abduction [facility]  Shoulder adduction    Shoulder extension    Shoulder internal rotation [facility]  Shoulder external rotation [facility]  Elbow flexion    Elbow extension    Wrist flexion    Wrist extension    Wrist ulnar deviation    Wrist radial deviation    Wrist pronation    Wrist supination     (Blank rows = not tested)  UPPER EXTREMITY MMT:  MMT Right eval Left eval  Shoulder flexion 4- 4-  Shoulder extension    Shoulder abduction 4- 4-  Shoulder adduction    Shoulder extension    Shoulder internal rotation 4 4  Shoulder external rotation 4 4  Middle trapezius    Lower trapezius    Elbow flexion 4+ 4+  Elbow extension 5 5  Wrist flexion    Wrist extension    Wrist ulnar deviation    Wrist radial deviation    Wrist pronation    Wrist supination    Grip strength     (Blank rows = not tested)  CERVICAL SPECIAL TESTS:  NT  FUNCTIONAL TESTS:  NT  TREATMENT DATE: 11/24/23  Subjective:  Pt. Reports no new complaints.  Pt. Working from home today and presents with forward/ slightly kyphotic posture in sitting and able to correct with cuing.  No subjective pain score for neck reported today.    Manual tx.:  Seated/ prone STM to B UT (R>L)/  mid-thoracic paraspinals.    Supine STM/ stretches on bilateral cervical paraspinals, levator scap, upper trap.  Supine cervical traction/ suboccipital release technique in supine 4x with holds.  Supine L/R cervical rotn. Stretches with holds (pain tolerable range).   Supine L/R shoulder flexion with OP.  Reassessment of cervical AROM (all planes) in seated posture.  B shoulder AROM (all planes).  No UE symptoms.    Prone TrP release technique to L/R UT region.  Use of Hypervolt to B UT/ upper thoracic region.    Application of Biofreeze to neck/ upper back in prone position after tx. Session.     No change to HEP  PATIENT EDUCATION:  Education details: Discussed POC/ posture Person educated: Patient Education method: Medical Illustrator Education comprehension: verbalized understanding and returned demonstration  HOME EXERCISE PROGRAM: Reviewed HEP  ASSESSMENT:  CLINICAL IMPRESSION: Pt. Demonstrates an improvement in upright posture during cervical/ UE ROM reassessment of edge of mat table.  Pt. Benefits from manual tx. To increase/ maintain cervical ROM in order to complete pain tolerable daily/ work-related tasks.  Pt. Has a trigger point in R upper cervical paraspinal that rolls with palpation/ tripper pt. Release technique. Pt. Will benefit from skilled PT services 1x/week to manage cervical limitations to improve pain-free mobility with daily/ work-related tasks.   OBJECTIVE IMPAIRMENTS: decreased mobility, decreased ROM, decreased strength, hypomobility, impaired flexibility, impaired UE functional use, improper body mechanics, postural dysfunction, and pain.   ACTIVITY LIMITATIONS: carrying, lifting, reach over head, hygiene/grooming, and caring for others  PARTICIPATION LIMITATIONS: meal prep, cleaning, laundry, driving, shopping, community activity, and occupation  PERSONAL FACTORS: Fitness and Past/current experiences are also affecting patient's functional  outcome.   REHAB POTENTIAL: Good  CLINICAL DECISION MAKING: Evolving/moderate complexity  EVALUATION COMPLEXITY: Moderate   GOALS: Goals reviewed with patient? Yes  SHORT TERM GOALS: Target date: 12/10/23  Pt. Independent with HEP to increase cervical AROM 25% to improve pain-free mobility.   Baseline:  see above Goal status: Partially met  2.  Pt. Will demonstrate proper seated upright posture to decrease neck pain with work-related tasks.  Baseline:  forward head, rounded sh. Posture.  Goal status: Not met  LONG TERM GOALS: Target date: 01/07/24  Pt. Will decrease NDI to <30% to improve pain-free mobility with daily tasks.  Baseline: 46% Goal status: On-going  2.  Pt. Will report no UE radicular symptoms/ tingling in hand with daily household/ work-related tasks to improve pain-free mobility.   Baseline:  R hand tingling (change in symptoms with activity). Goal status: Partially met  3.  Pt. Will report minimal B UT trigger point tenderness to improve pain-free mobility.   Baseline: (+) tenderness Goal status: Not met  PLAN:  PT FREQUENCY: 1-2x/week  PT DURATION: 8 weeks  PLANNED INTERVENTIONS: 97750- Physical Performance Testing, 97110-Therapeutic exercises, 97530- Therapeutic activity, [id number]- Neuromuscular re-education, 97535- Self Care, 02859- Manual therapy, G0283- Electrical stimulation (unattended), [id number]- Electrical stimulation (manual), 20560 (1-2 muscles), 20561 (3+ muscles)- Dry Needling, Patient/Family education, Joint mobilization, Spinal mobilization, Cryotherapy, and Moist heat  PLAN FOR NEXT SESSION: COMPLETE NDI.    Ozell JAYSON Sero, PT, DPT # [phone] 11/27/2023, 9:52 AM

## 2023-11-27 NOTE — Therapy (Signed)
 OUTPATIENT PHYSICAL THERAPY CERVICAL TREATMENT Physical Therapy Progress Note  Dates of reporting period  08/18/23   to   11/17/23   Patient Name: Amanda Humphrey MRN: [id number] DOB:1965/10/17, 58 y.o., female Today's Date: 11/17/2023  PCP: Marylynn Verneita CROME, MD REFERRING PROVIDER: Marylynn Verneita CROME, MD   PT End of Session - 11/27/23 0753     Visit Number 10    Number of Visits 17    Date for Recertification  01/07/24    PT Start Time 1116    PT Stop Time 1202    PT Time Calculation (min) 46 min    Activity Tolerance Patient tolerated treatment well    Behavior During Therapy [facility] [place] for tasks assessed/performed         Past Medical History:  Diagnosis Date   Allergy    Arthritis    Chronic kidney disease    stones   Fibrocystic breast disease 2013   GERD (gastroesophageal reflux disease)    Hyperlipidemia    Hypertension    Meniere disease    Migraines    Neuromuscular disorder (HCC) 2019   Past Surgical History:  Procedure Laterality Date   ABDOMINAL HYSTERECTOMY  2011   laprascopic supracervical, [place]   APPENDECTOMY  1999   BREAST BIOPSY Right 11/2010    fibrocystic disease, [place]   CESAREAN SECTION     4 total   CHOLECYSTECTOMY  2011   SPINE SURGERY  2021   TONSILLECTOMY AND ADENOIDECTOMY  1975   TUBAL LIGATION     Patient Active Problem List   Diagnosis Date Noted   Acute respiratory infection 11/03/2023   Acute cough 11/03/2023   Sinus pressure 11/03/2023   Statin myopathy 05/20/2023   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   H/O of hemilaminectomy 02/15/2019   Cervical spondylitis with radiculitis 11/17/2018   Fatigue 09/23/2017   History of pneumonia 04/30/2016   Grief reaction 04/30/2016   Vitamin D  deficiency 02/22/2015   Hyperlipidemia 01/01/2015   Visit for preventive health examination 12/04/2013   Lower extremity edema 09/02/2013   Essential hypertension 09/02/2013   History of cardiomyopathy 09/02/2013   SVT (supraventricular  tachycardia) 09/02/2013   Contrast media allergy 03/10/2013   Fibrocystic breast disease    Morbid obesity (HCC) 02/05/2012   Meniere disease     PCP: Marylynn Verneita CROME, MD  REFERRING PROVIDER: Marylynn Verneita CROME, MD  REFERRING DIAG: [phone] (ICD-10-CM) - Chronic neck pain   THERAPY DIAG:  Neck pain  Joint stiffness of spine  Muscle weakness (generalized)  Rationale for Evaluation and Treatment: Rehabilitation  ONSET DATE: chronic  SUBJECTIVE:  SUBJECTIVE STATEMENT: Pt. Well known to PT clinic for chronic neck pain (see previous PT notes).  Pt. Reports 3-4/10 neck pain (L and R C3-4 region).  Pt. Describes pain as sharp in R c-spine (6/10) and c/o 9/10 pain at worst with residual HA.  Pt. States HA have been daily in occipital region (burning, stabbing).   Pt. C/o persistent tension in neck and has not participated with PT in several months due to work/ family/ schedule.  Pt. Has tingling sensation in R hand and c/o muscle contraction in L hand. Hand dominance: Right  PERTINENT HISTORY:  See MD/ PT notes.    PAIN:  Are you having pain? Yes: NPRS scale: 3/10  Pain location: cervical paraspinals/ UT musculature Pain description: achy/ tender Aggravating factors: prolonged position/ repeated movement Relieving factors: rest/ massage  PRECAUTIONS: Cervical  RED FLAGS: None    WEIGHT BEARING RESTRICTIONS: No  FALLS:  Has patient fallen in last 6 months? 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    NEXT MD VISIT: PRN  OBJECTIVE:  Note: Objective  measures were completed at Evaluation unless otherwise noted.  DIAGNOSTIC FINDINGS:  See MD notes  PATIENT SURVEYS:  NDI: 46% self-perceived moderate disability.    COGNITION: Overall cognitive status: Within functional limits for tasks assessed  SENSATION: WFL  POSTURE: rounded shoulders and forward head  PALPATION: (+) tenderness   CERVICAL ROM:   Active ROM A/PROM (deg) eval  Flexion 51 deg.  Extension 28 deg.  Right lateral flexion 60 deg.  Left lateral flexion 58 deg.  Right rotation 37 deg.  Left rotation 33 deg. (Pain)   (Blank rows = not tested)  UPPER EXTREMITY ROM:  Active ROM Right eval Left eval  Shoulder flexion [facility]  Shoulder extension    Shoulder abduction [facility]  Shoulder adduction    Shoulder extension    Shoulder internal rotation [facility]  Shoulder external rotation [facility]  Elbow flexion    Elbow extension    Wrist flexion    Wrist extension    Wrist ulnar deviation    Wrist radial deviation    Wrist pronation    Wrist supination     (Blank rows = not tested)  UPPER EXTREMITY MMT:  MMT Right eval Left eval  Shoulder flexion 4- 4-  Shoulder extension    Shoulder abduction 4- 4-  Shoulder adduction    Shoulder extension    Shoulder internal rotation 4 4  Shoulder external rotation 4 4  Middle trapezius    Lower trapezius    Elbow flexion 4+ 4+  Elbow extension 5 5  Wrist flexion    Wrist extension    Wrist ulnar deviation    Wrist radial deviation    Wrist pronation    Wrist supination    Grip strength     (Blank rows = not tested)  CERVICAL SPECIAL TESTS:  NT  FUNCTIONAL TESTS:  NT  TREATMENT DATE: 11/17/23  Subjective:  Pt. States she has been busy with work over past week.  Pt. Reports no UE radicular symptoms and <4/10 neck pain.  Pt. C/o R upper cervical paraspinal tightness/  tenderness with palpation.   Manual tx.:  Reassessment of cervical AROM (all planes) in seated posture.  B shoulder AROM (all planes).  No UE symptoms.    Seated/ prone STM to B UT (R>L)/ mid-thoracic paraspinals.    Supine STM/ stretches on bilateral cervical paraspinals, levator scap, upper trap.  Supine cervical traction/ suboccipital release technique in supine 3x with holds.  Supine L/R cervical rotn. Stretches with holds (pain tolerable range).   Supine L/R shoulder flexion with OP and reassessment of UT and posterior deltoid tightness.   Prone TrP release technique to L/R UT region.  Use of Hypervolt to B UT/ upper thoracic region.    Application of Biofreeze to neck/ upper back in prone position after tx. Session.     No change to HEP  PATIENT EDUCATION:  Education details: Discussed POC/ posture Person educated: Patient Education method: Medical Illustrator Education comprehension: verbalized understanding and returned demonstration  HOME EXERCISE PROGRAM: Reviewed HEP  ASSESSMENT:  CLINICAL IMPRESSION: Pt. Reports persistent neck pain (<4/10)/ tightness/ tension with no HA reported.  Pt. Presents with forward head/ rounded shoulder posture in sitting/ standing. Pt. Able to correct seated posture with verbal/ tactile cuing.   Pt. Has (+) tenderness over R upper cervical paraspinals and suboccipital region.  STM at R C3-5 region with generalized tightness/ tenderness noted.  Pt. Benefits from manual tx. To increase/ maintain cervical ROM in order to complete pain tolerable daily/ work-related tasks.  Pt. Will benefit from skilled PT services 1x/week to manage cervical limitations to improve pain-free mobility with daily/ work-related tasks.   OBJECTIVE IMPAIRMENTS: decreased mobility, decreased ROM, decreased strength, hypomobility, impaired flexibility, impaired UE functional use, improper body mechanics, postural dysfunction, and pain.   ACTIVITY LIMITATIONS:  carrying, lifting, reach over head, hygiene/grooming, and caring for others  PARTICIPATION LIMITATIONS: meal prep, cleaning, laundry, driving, shopping, community activity, and occupation  PERSONAL FACTORS: Fitness and Past/current experiences are also affecting patient's functional outcome.   REHAB POTENTIAL: Good  CLINICAL DECISION MAKING: Evolving/moderate complexity  EVALUATION COMPLEXITY: Moderate   GOALS: Goals reviewed with patient? Yes  SHORT TERM GOALS: Target date: 12/10/23  Pt. Independent with HEP to increase cervical AROM 25% to improve pain-free mobility.   Baseline:  see above Goal status: Partially met  2.  Pt. Will demonstrate proper seated upright posture to decrease neck pain with work-related tasks.  Baseline:  forward head, rounded sh. Posture.  Goal status: Not met  LONG TERM GOALS: Target date: 01/07/24  Pt. Will decrease NDI to <30% to improve pain-free mobility with daily tasks.  Baseline: 46% Goal status: On-going  2.  Pt. Will report no UE radicular symptoms/ tingling in hand with daily household/ work-related tasks to improve pain-free mobility.   Baseline:  R hand tingling (change in symptoms with activity). Goal status: Partially met  3.  Pt. Will report minimal B UT trigger point tenderness to improve pain-free mobility.   Baseline: (+) tenderness Goal status: Not met  PLAN:  PT FREQUENCY: 1-2x/week  PT DURATION: 8 weeks  PLANNED INTERVENTIONS: 97750- Physical Performance Testing, 97110-Therapeutic exercises, 97530- Therapeutic activity, [id number]- Neuromuscular re-education, 97535- Self Care, 02859- Manual therapy, [id number]- Electrical stimulation (unattended), [phone]- Electrical stimulation (manual), [phone] (1-2 muscles), 20561 (3+ muscles)- Dry Needling, Patient/Family education, Joint mobilization, Spinal mobilization, Cryotherapy, and Moist  heat  PLAN FOR NEXT SESSION: COMPLETE NDI  Ozell JAYSON Sero, PT, DPT # [phone] 11/27/2023, 9:24 AM

## 2023-12-07 NOTE — Progress Notes (Unsigned)
 Cardiology Office Note  Date:  12/08/2023   ID:  Amanda Humphrey, DOB [birth year], MRN [id number]  PCP:  Marylynn Verneita CROME, MD   Chief Complaint  Patient presents with   12 month follow up     Patient denies chest pain or shortness of breath.     HPI:  Amanda Humphrey is a very pleasant 58 year-old woman with remote history of cardiomyopathy approximately 20 years ago after having her third child with mild depression of her ejection fraction which returned back to normal 6 months later,  periodic SVT, palpitations,  lower extremity swelling  Coronary calcification score 21 Hyperlipidemia who presents for routine follow-up of her arrhythmia, hypertension, fluid retention following neck surgery January 2021, hyperlipidemia  Last seen by myself in clinic October 2024  Remains on Zetia , off Repatha  and wegovy  (not covered by insurance) Tried compounded wegovy  (back on it past 2 weeks) LDL 126 total cholesterol 766 Weight down 15 pounds in past year  Continues to work at [employer], works 2 days at home Chronic neck pain, does PT /dry needling, muscle relaxants on a regular basis Trying to avoid additional surgery if possible, would prefer not to have hardware  Stress at home, husband with medical issues  History of statin intolerance, myalgias with crest   EKG personally reviewed by myself on todays visit EKG Interpretation Date/Time:  Tuesday December 08 2023 13:45:03 EST Ventricular Rate:  87 PR Interval:  140 QRS Duration:  140 QT Interval:  398 QTC Calculation: 478 R Axis:   90  Text Interpretation: Normal sinus rhythm Right bundle branch block When compared with ECG of 02-Dec-2022 13:50, No significant change was found Confirmed by Perla Lye [phone]) on 12/08/2023 1:50:19 PM   COVID-positive September 2023 Treated with Paxlovid , prednisone   [facility] records from neurosurgery reviewed status post right C7-T1 discectomy and foraminotomy/hemilaminectomy February 08, 2019 Performed  at [facility] procedure orthostatic hypotension, treated with fluid boluses Taking Valium , gabapentin  for muscle spasms Recent office note from neurosurgery indicates she is on Lasix  Working with physical therapy, initially using a walker for stability Residual numbness right hand, Improvement in grip and dexterity C8 nerve distribution And ability to tolerate narcotics  post decompression nerve root edema accentuated by postop fluid boluses for postop postural hypotension and apparent allergic reaction to Dilaudid and some rebound swelling upon completion of Decadron  taper.  Prior history of cardiomyopathy many years ago that seemed to significantly improve with aggressive diuresis. Approximately 18 years ago, Lasix  was provided with 16 pound weight loss and improvement of her symptoms. On her fourth child she had fluid retention but no recurrent cardiomyopathy   prior echocardiogram 12/16/2010 showing normal ejection fraction rate and 55%, mild MR    PMH:   has a past medical history of Allergy, Arthritis, Chronic kidney disease, Fibrocystic breast disease (2013), GERD (gastroesophageal reflux disease), Hyperlipidemia, Hypertension, Meniere disease, Migraines, and Neuromuscular disorder (HCC) (2019).  PSH:    Past Surgical History:  Procedure Laterality Date   ABDOMINAL HYSTERECTOMY  2011   laprascopic supracervical, [place]   APPENDECTOMY  1999   BREAST BIOPSY Right 11/2010    fibrocystic disease, [place]   CESAREAN SECTION     4 total   CHOLECYSTECTOMY  2011   SPINE SURGERY  2021   TONSILLECTOMY AND ADENOIDECTOMY  1975   TUBAL LIGATION      Current Outpatient Medications  Medication Sig Dispense Refill   albuterol  (VENTOLIN  HFA) 108 (90 Base) MCG/ACT inhaler Inhale 2 puffs into the  lungs every 4 (four) hours as needed for wheezing or shortness of breath. 1 each 0   amphetamine-dextroamphetamine (ADDERALL) 20 MG tablet Take 20 mg by mouth every evening.      carvedilol  (COREG )  6.25 MG tablet TAKE 1 TABLET TWICE A DAY (PLEASE CALL OUR OFFICE AND SCHEDULE YEARLY APPOINTMENT FOR FURTHER REFILLS) 180 tablet 3   cyclobenzaprine  (FLEXERIL ) 10 MG tablet Take 1 tablet (10 mg total) by mouth at bedtime. 90 tablet 3   ezetimibe  (ZETIA ) 10 MG tablet Take 1 tablet (10 mg total) by mouth daily. 90 tablet 3   furosemide  (LASIX ) 20 MG tablet TAKE 1 TABLET DAILY AS NEEDED 90 tablet 3   hydrochlorothiazide  (HYDRODIURIL ) 25 MG tablet TAKE 1 TABLET DAILY 90 tablet 3   ipratropium (ATROVENT ) 0.06 % nasal spray Place 2 sprays into both nostrils 4 (four) times daily. 15 mL 12   methocarbamol  (ROBAXIN ) 500 MG tablet Take 1 tablet (500 mg total) by mouth every 8 (eight) hours as needed for muscle spasms. 270 tablet 3   omeprazole  (PRILOSEC) 40 MG capsule TAKE 1 CAPSULE DAILY 90 capsule 3   promethazine -dextromethorphan (PROMETHAZINE -DM) 6.25-15 MG/5ML syrup Take 5 mLs by mouth 4 (four) times daily as needed for cough. 118 mL 0   Evolocumab  (REPATHA  SURECLICK) 140 MG/ML SOAJ Inject 140 mg into the skin every 14 (fourteen) days. (Patient not taking: Reported on 12/08/2023) 6 mL 3   No current facility-administered medications for this visit.    Allergies:   Bee venom, Codeine, Hydrocodone, Tramadol, and Oxycodone   Social History:  The patient  reports that she has never smoked. She has never used smokeless tobacco. She reports current alcohol use of about 1.0 standard drink of alcohol per week. She reports that she does not use drugs.   Family History:   family history includes ADD / ADHD in her mother; Alzheimer's disease in her maternal grandmother and paternal grandmother; Arthritis in her mother; Breast cancer in her maternal grandmother and paternal grandmother; Cancer in her mother; Diabetes in her father; Heart disease in her maternal grandmother; Hyperlipidemia in her father; Hypertension in her father and mother; Stroke in her father.    Review of Systems: Review of Systems   Constitutional: Negative.   HENT: Negative.    Respiratory: Negative.    Cardiovascular: Negative.   Gastrointestinal: Negative.   Musculoskeletal: Negative.   Neurological: Negative.   Psychiatric/Behavioral: Negative.    All other systems reviewed and are negative.   PHYSICAL EXAM: VS:  BP 128/78 (BP Location: Left Arm, Patient Position: Sitting, Cuff Size: Large)   Pulse 87   Ht 5' 8 (1.727 m)   Wt 246 lb 6 oz (111.8 kg)   BMI 37.46 kg/m  , BMI Body mass index is 37.46 kg/m. Constitutional:  oriented to person, place, and time. No distress.  HENT:  Head: Grossly normal Eyes:  no discharge. No scleral icterus.  Neck: No JVD, no carotid bruits  Cardiovascular: Regular rate and rhythm, no murmurs appreciated Pulmonary/Chest: Clear to auscultation bilaterally, no wheezes or rails Abdominal: Soft.  no distension.  no tenderness.  Musculoskeletal: Normal range of motion Neurological:  normal muscle tone. Coordination normal. No atrophy Skin: Skin warm and dry Psychiatric: normal affect, pleasant  Recent Labs: 05/20/2023: ALT 16; BUN 14; Creatinine, Ser 1.01; Hemoglobin 14.2; Platelets 363.0; Potassium 4.0; Sodium 139; TSH 1.11    Lipid Panel Lab Results  Component Value Date   CHOL 233 (H) 05/20/2023   HDL 52.70 05/20/2023  LDLCALC 126 (H) 05/20/2023   TRIG 272.0 (H) 05/20/2023    Wt Readings from Last 3 Encounters:  12/08/23 246 lb 6 oz (111.8 kg)  05/20/23 248 lb 12.8 oz (112.9 kg)  01/31/23 246 lb 0.5 oz (111.6 kg)     ASSESSMENT AND PLAN:  SVT (supraventricular tachycardia) (HCC) well-controlled on carvedilol  6.25 twice daily extra carvedilol  for breakthrough tachycardia  Essential hypertension Blood pressure is well controlled on today's visit. No changes made to the medications.  Coronary artery disease/aortic atherosclerosis History of mixed hyperlipidemia Not on Crestor  secondary to statin myalgia   Repatha  not covered by insurance Change Zetia   to nexlezit 180/10 mg daily if covered by insurance with coupon  Lower extremity edema Symptoms are stable on HCTZ  Rarely uses Lasix    Obesity On compounded Wegovy   Hyperlipidemia Prescription sent in for nexlezit if covered by insurance  Orders Placed This Encounter  Procedures   EKG 12-Lead     Signed, Velinda Lunger, M.D., Ph.D. 12/08/2023  [facility] [place], [place] [phone]

## 2023-12-08 ENCOUNTER — Ambulatory Visit: Attending: Cardiovascular Disease | Admitting: Cardiovascular Disease

## 2023-12-08 ENCOUNTER — Other Ambulatory Visit (HOSPITAL_COMMUNITY): Payer: Self-pay

## 2023-12-08 ENCOUNTER — Telehealth: Payer: Self-pay | Admitting: Pharmacy Technician

## 2023-12-08 ENCOUNTER — Encounter: Payer: Self-pay | Admitting: Cardiovascular Disease

## 2023-12-08 VITALS — BP 128/78 | HR 87 | Ht 68.0 in | Wt 246.4 lb

## 2023-12-08 DIAGNOSIS — I471 Supraventricular tachycardia, unspecified: Secondary | ICD-10-CM | POA: Diagnosis not present

## 2023-12-08 DIAGNOSIS — R002 Palpitations: Secondary | ICD-10-CM | POA: Diagnosis not present

## 2023-12-08 DIAGNOSIS — I2510 Atherosclerotic heart disease of native coronary artery without angina pectoris: Secondary | ICD-10-CM

## 2023-12-08 DIAGNOSIS — I10 Essential (primary) hypertension: Secondary | ICD-10-CM | POA: Diagnosis not present

## 2023-12-08 DIAGNOSIS — Z8679 Personal history of other diseases of the circulatory system: Secondary | ICD-10-CM

## 2023-12-08 MED ORDER — NEXLIZET 180-10 MG PO TABS: 1.0000 | ORAL_TABLET | Freq: Every day | ORAL | 11 refills | Status: AC

## 2023-12-08 MED ORDER — FUROSEMIDE 20 MG PO TABS: 20.0000 mg | ORAL_TABLET | Freq: Every day | ORAL | 3 refills | Status: DC | PRN

## 2023-12-08 MED ORDER — NEXLIZET 180-10 MG PO TABS: 1.0000 | ORAL_TABLET | Freq: Every day | ORAL | Status: AC

## 2023-12-08 MED ORDER — CARVEDILOL 6.25 MG PO TABS: 6.2500 mg | ORAL_TABLET | Freq: Two times a day (BID) | ORAL | 3 refills | Status: AC

## 2023-12-08 MED ORDER — HYDROCHLOROTHIAZIDE 25 MG PO TABS: 25.0000 mg | ORAL_TABLET | Freq: Every day | ORAL | 3 refills | Status: AC

## 2023-12-08 NOTE — Patient Instructions (Addendum)
 Medication Instructions:   Nexlizit 180/10 mg daily with coupon  If you need a refill on your cardiac medications before your next appointment, please call your pharmacy.   Lab work: No new labs needed  Testing/Procedures: No new testing needed  Follow-Up: At [facility], you and your health needs are our priority.  As part of our continuing mission to provide you with exceptional heart care, we have created designated Provider Care Teams.  These Care Teams include your primary Cardiologist (physician) and Advanced Practice Providers (APPs -  Physician Assistants and Nurse Practitioners) who all work together to provide you with the care you need, when you need it.  You will need a follow up appointment in 12 months  Providers on your designated Care Team:   Lonni Meager, NP Bernardino Bring, PA-C Cadence [place], [place]  COVID-19 Vaccine Information can be found at: [email or web address] For questions related to vaccine distribution or appointments, please email [email or web address] or call [phone].

## 2023-12-08 NOTE — Telephone Encounter (Signed)
   Pharmacy Patient Advocate Encounter   Received notification from CoverMyMeds that prior authorization for nexlizet is required/requested.   Insurance verification completed.   The patient is insured through [employer].   Per test claim: PA required; PA submitted to above mentioned insurance via Latent Key/confirmation #/EOC [id number] Status is pending

## 2023-12-08 NOTE — Telephone Encounter (Signed)
 Pharmacy Patient Advocate Encounter  Received notification from [employer] that Prior Authorization for nexlizet has been APPROVED from 12/08/2023 to 12/07/24   PA #/Case ID/Reference #: [id number]

## 2023-12-08 NOTE — Addendum Note (Signed)
 Addended by: CRISTOPHER OLIVIA PARAS on: 12/08/2023 02:26 PM   Modules accepted: Orders

## 2023-12-10 ENCOUNTER — Ambulatory Visit: Attending: Internal Medicine | Admitting: Physical Therapy

## 2023-12-10 DIAGNOSIS — M542 Cervicalgia: Secondary | ICD-10-CM | POA: Diagnosis not present

## 2023-12-10 DIAGNOSIS — M6281 Muscle weakness (generalized): Secondary | ICD-10-CM | POA: Diagnosis not present

## 2023-12-10 DIAGNOSIS — M79601 Pain in right arm: Secondary | ICD-10-CM | POA: Insufficient documentation

## 2023-12-10 DIAGNOSIS — M2560 Stiffness of unspecified joint, not elsewhere classified: Secondary | ICD-10-CM | POA: Diagnosis not present

## 2023-12-12 NOTE — Therapy (Signed)
 OUTPATIENT PHYSICAL THERAPY CERVICAL TREATMENT  Patient Name: Amanda Humphrey MRN: [id number] DOB:1965-08-02, 58 y.o., female Today's Date: 12/10/2023  PCP: Marylynn Verneita CROME, MD REFERRING PROVIDER: Marylynn Verneita CROME, MD   PT End of Session - 12/12/23 2205     Visit Number 12    Number of Visits 17    Date for Recertification  01/07/24    PT Start Time 0946    PT Stop Time 1025    PT Time Calculation (min) 39 min    Activity Tolerance Patient tolerated treatment well    Behavior During Therapy [facility] for tasks assessed/performed         Past Medical History:  Diagnosis Date   Allergy    Arthritis    Chronic kidney disease    stones   Fibrocystic breast disease 2013   GERD (gastroesophageal reflux disease)    Hyperlipidemia    Hypertension    Meniere disease    Migraines    Neuromuscular disorder (HCC) 2019   Past Surgical History:  Procedure Laterality Date   ABDOMINAL HYSTERECTOMY  2011   laprascopic supracervical, [place]   APPENDECTOMY  1999   BREAST BIOPSY Right 11/2010    fibrocystic disease, [place]   CESAREAN SECTION     4 total   CHOLECYSTECTOMY  2011   SPINE SURGERY  2021   TONSILLECTOMY AND ADENOIDECTOMY  1975   TUBAL LIGATION     Patient Active Problem List   Diagnosis Date Noted   Acute respiratory infection 11/03/2023   Acute cough 11/03/2023   Sinus pressure 11/03/2023   Statin myopathy 05/20/2023   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   H/O of hemilaminectomy 02/15/2019   Cervical spondylitis with radiculitis 11/17/2018   Fatigue 09/23/2017   History of pneumonia 04/30/2016   Grief reaction 04/30/2016   Vitamin D  deficiency 02/22/2015   Hyperlipidemia 01/01/2015   Visit for preventive health examination 12/04/2013   Lower extremity edema 09/02/2013   Essential hypertension 09/02/2013   History of cardiomyopathy 09/02/2013   SVT (supraventricular tachycardia) 09/02/2013   Contrast media allergy 03/10/2013   Fibrocystic breast disease     Morbid obesity (HCC) 02/05/2012   Meniere disease     PCP: Marylynn Verneita CROME, MD  REFERRING PROVIDER: Marylynn Verneita CROME, MD  REFERRING DIAG: [phone] (ICD-10-CM) - Chronic neck pain   THERAPY DIAG:  Neck pain  Joint stiffness of spine  Muscle weakness (generalized)  Pain in right arm  Rationale for Evaluation and Treatment: Rehabilitation  ONSET DATE: chronic  SUBJECTIVE:  SUBJECTIVE STATEMENT: Pt. Well known to PT clinic for chronic neck pain (see previous PT notes).  Pt. Reports 3-4/10 neck pain (L and R C3-4 region).  Pt. Describes pain as sharp in R c-spine (6/10) and c/o 9/10 pain at worst with residual HA.  Pt. States HA have been daily in occipital region (burning, stabbing).   Pt. C/o persistent tension in neck and has not participated with PT in several months due to work/ family/ schedule.  Pt. Has tingling sensation in R hand and c/o muscle contraction in L hand. Hand dominance: Right  PERTINENT HISTORY:  See MD/ PT notes.    PAIN:  Are you having pain? Yes: NPRS scale: 3/10  Pain location: cervical paraspinals/ UT musculature Pain description: achy/ tender Aggravating factors: prolonged position/ repeated movement Relieving factors: rest/ massage  PRECAUTIONS: Cervical  RED FLAGS: None    WEIGHT BEARING RESTRICTIONS: No  FALLS:  Has patient fallen in last 6 months? 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    NEXT MD VISIT: PRN  OBJECTIVE:  Note: Objective measures were completed at Evaluation unless otherwise noted.  DIAGNOSTIC  FINDINGS:  See MD notes  PATIENT SURVEYS:  NDI: 46% self-perceived moderate disability.    COGNITION: Overall cognitive status: Within functional limits for tasks assessed  SENSATION: WFL  POSTURE: rounded shoulders and forward head  PALPATION: (+) tenderness   CERVICAL ROM:   Active ROM A/PROM (deg) eval  Flexion 51 deg.  Extension 28 deg.  Right lateral flexion 60 deg.  Left lateral flexion 58 deg.  Right rotation 37 deg.  Left rotation 33 deg. (Pain)   (Blank rows = not tested)  UPPER EXTREMITY ROM:  Active ROM Right eval Left eval  Shoulder flexion [facility]  Shoulder extension    Shoulder abduction [facility]  Shoulder adduction    Shoulder extension    Shoulder internal rotation [facility]  Shoulder external rotation [facility]  Elbow flexion    Elbow extension    Wrist flexion    Wrist extension    Wrist ulnar deviation    Wrist radial deviation    Wrist pronation    Wrist supination     (Blank rows = not tested)  UPPER EXTREMITY MMT:  MMT Right eval Left eval  Shoulder flexion 4- 4-  Shoulder extension    Shoulder abduction 4- 4-  Shoulder adduction    Shoulder extension    Shoulder internal rotation 4 4  Shoulder external rotation 4 4  Middle trapezius    Lower trapezius    Elbow flexion 4+ 4+  Elbow extension 5 5  Wrist flexion    Wrist extension    Wrist ulnar deviation    Wrist radial deviation    Wrist pronation    Wrist supination    Grip strength     (Blank rows = not tested)  CERVICAL SPECIAL TESTS:  NT  FUNCTIONAL TESTS:  NT  TREATMENT DATE: 12/10/23  Subjective:  Pt. Has been out of town on work trip since last PT tx. Session.  Pt. Reports no new complaints.    Manual tx.:  Seated/ prone STM to B UT (R>L)/ mid-thoracic paraspinals.    Supine STM/ stretches on bilateral cervical paraspinals, levator  scap, upper trap.  Supine cervical traction/ suboccipital release technique in supine 5x with holds.  Supine L/R cervical rotn. Stretches with holds (pain tolerable range).   Supine L/R shoulder flexion with OP.  Reassessment of cervical AROM (all planes) in seated posture.  B shoulder AROM (all planes).  No UE symptoms.    Prone TrP release technique to L/R UT region.  No Hypervolt to B UT/ upper thoracic region today.    Application of Biofreeze to neck/ upper back in prone position after tx. Session.     No change to HEP  PATIENT EDUCATION:  Education details: Discussed POC/ posture Person educated: Patient Education method: Medical Illustrator Education comprehension: verbalized understanding and returned demonstration  HOME EXERCISE PROGRAM: Reviewed HEP  ASSESSMENT:  CLINICAL IMPRESSION: Pt. Demonstrates an improvement in upright posture during cervical/ UE ROM reassessment of edge of mat table.  Pt. Benefits from manual tx. To increase/ maintain cervical ROM in order to complete pain tolerable daily/ work-related tasks.  Pt. Has a trigger point in R upper cervical paraspinal that rolls with palpation/ tripper pt. Release technique. Pt. Will benefit from skilled PT services 1x/week to manage cervical limitations to improve pain-free mobility with daily/ work-related tasks.   OBJECTIVE IMPAIRMENTS: decreased mobility, decreased ROM, decreased strength, hypomobility, impaired flexibility, impaired UE functional use, improper body mechanics, postural dysfunction, and pain.   ACTIVITY LIMITATIONS: carrying, lifting, reach over head, hygiene/grooming, and caring for others  PARTICIPATION LIMITATIONS: meal prep, cleaning, laundry, driving, shopping, community activity, and occupation  PERSONAL FACTORS: Fitness and Past/current experiences are also affecting patient's functional outcome.   REHAB POTENTIAL: Good  CLINICAL DECISION MAKING: Evolving/moderate  complexity  EVALUATION COMPLEXITY: Moderate   GOALS: Goals reviewed with patient? Yes  SHORT TERM GOALS: Target date: 12/10/23  Pt. Independent with HEP to increase cervical AROM 25% to improve pain-free mobility.   Baseline:  see above Goal status: Partially met  2.  Pt. Will demonstrate proper seated upright posture to decrease neck pain with work-related tasks.  Baseline:  forward head, rounded sh. Posture.  Goal status: Not met  LONG TERM GOALS: Target date: 01/07/24  Pt. Will decrease NDI to <30% to improve pain-free mobility with daily tasks.  Baseline: 46% Goal status: On-going  2.  Pt. Will report no UE radicular symptoms/ tingling in hand with daily household/ work-related tasks to improve pain-free mobility.   Baseline:  R hand tingling (change in symptoms with activity). Goal status: Partially met  3.  Pt. Will report minimal B UT trigger point tenderness to improve pain-free mobility.   Baseline: (+) tenderness Goal status: Not met  PLAN:  PT FREQUENCY: 1-2x/week  PT DURATION: 8 weeks  PLANNED INTERVENTIONS: 97750- Physical Performance Testing, 97110-Therapeutic exercises, 97530- Therapeutic activity, [id number]- Neuromuscular re-education, 97535- Self Care, 02859- Manual therapy, G0283- Electrical stimulation (unattended), [id number]- Electrical stimulation (manual), 20560 (1-2 muscles), 20561 (3+ muscles)- Dry Needling, Patient/Family education, Joint mobilization, Spinal mobilization, Cryotherapy, and Moist heat  PLAN FOR NEXT SESSION: COMPLETE NDI.    Ozell JAYSON Sero, PT, DPT # [phone] 12/12/2023, 10:07 PM

## 2023-12-17 ENCOUNTER — Ambulatory Visit: Admitting: Physical Therapy

## 2023-12-17 DIAGNOSIS — M542 Cervicalgia: Secondary | ICD-10-CM

## 2023-12-17 DIAGNOSIS — M6281 Muscle weakness (generalized): Secondary | ICD-10-CM | POA: Diagnosis not present

## 2023-12-17 DIAGNOSIS — M79601 Pain in right arm: Secondary | ICD-10-CM | POA: Diagnosis not present

## 2023-12-17 DIAGNOSIS — M2560 Stiffness of unspecified joint, not elsewhere classified: Secondary | ICD-10-CM | POA: Diagnosis not present

## 2023-12-22 ENCOUNTER — Ambulatory Visit: Admitting: Physical Therapy

## 2023-12-23 ENCOUNTER — Ambulatory Visit: Admitting: Physical Therapy

## 2023-12-23 DIAGNOSIS — M79601 Pain in right arm: Secondary | ICD-10-CM | POA: Diagnosis not present

## 2023-12-23 DIAGNOSIS — M6281 Muscle weakness (generalized): Secondary | ICD-10-CM | POA: Diagnosis not present

## 2023-12-23 DIAGNOSIS — M542 Cervicalgia: Secondary | ICD-10-CM

## 2023-12-23 DIAGNOSIS — M2560 Stiffness of unspecified joint, not elsewhere classified: Secondary | ICD-10-CM | POA: Diagnosis not present

## 2023-12-26 NOTE — Therapy (Signed)
 OUTPATIENT PHYSICAL THERAPY CERVICAL TREATMENT  Patient Name: Amanda Humphrey MRN: [id number] DOB:[birth year], 58 y.o., female Today's Date: 12/23/2023  PCP: Marylynn Verneita CROME, MD REFERRING PROVIDER: Marylynn Verneita CROME, MD  PT End of Session - 12/26/23 1315       Visit Number 14     Number of Visits 17     Date for Recertification  01/07/24     PT Start Time 1118     PT Stop Time 1200    PT Time Calculation (min) 42 min     Activity Tolerance Patient tolerated treatment well     Behavior During Therapy [facility] for tasks assessed/performed      Past Medical History:  Diagnosis Date   Allergy    Arthritis    Chronic kidney disease    stones   Fibrocystic breast disease 2013   GERD (gastroesophageal reflux disease)    Hyperlipidemia    Hypertension    Meniere disease    Migraines    Neuromuscular disorder (HCC) 2019   Past Surgical History:  Procedure Laterality Date   ABDOMINAL HYSTERECTOMY  2011   laprascopic supracervical, [place]   APPENDECTOMY  1999   BREAST BIOPSY Right 11/2010    fibrocystic disease, [place]   CESAREAN SECTION     4 total   CHOLECYSTECTOMY  2011   SPINE SURGERY  2021   TONSILLECTOMY AND ADENOIDECTOMY  1975   TUBAL LIGATION     Patient Active Problem List   Diagnosis Date Noted   Acute respiratory infection 11/03/2023   Acute cough 11/03/2023   Sinus pressure 11/03/2023   Statin myopathy 05/20/2023   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   H/O of hemilaminectomy 02/15/2019   Cervical spondylitis with radiculitis 11/17/2018   Fatigue 09/23/2017   History of pneumonia 04/30/2016   Grief reaction 04/30/2016   Vitamin D  deficiency 02/22/2015   Hyperlipidemia 01/01/2015   Visit for preventive health examination 12/04/2013   Lower extremity edema 09/02/2013   Essential hypertension 09/02/2013   History of cardiomyopathy 09/02/2013   SVT (supraventricular tachycardia) 09/02/2013   Contrast media allergy 03/10/2013   Fibrocystic breast disease     Morbid obesity (HCC) 02/05/2012   Meniere disease     PCP: Marylynn Verneita CROME, MD  REFERRING PROVIDER: Marylynn Verneita CROME, MD  REFERRING DIAG: [phone] (ICD-10-CM) - Chronic neck pain   THERAPY DIAG:  Neck pain  Joint stiffness of spine  Muscle weakness (generalized)  Pain in right arm  Rationale for Evaluation and Treatment: Rehabilitation  ONSET DATE: chronic  SUBJECTIVE:  SUBJECTIVE STATEMENT: Pt. Well known to PT clinic for chronic neck pain (see previous PT notes).  Pt. Reports 3-4/10 neck pain (L and R C3-4 region).  Pt. Describes pain as sharp in R c-spine (6/10) and c/o 9/10 pain at worst with residual HA.  Pt. States HA have been daily in occipital region (burning, stabbing).   Pt. C/o persistent tension in neck and has not participated with PT in several months due to work/ family/ schedule.  Pt. Has tingling sensation in R hand and c/o muscle contraction in L hand. Hand dominance: Right  PERTINENT HISTORY:  See MD/ PT notes.    PAIN:  Are you having pain? Yes: NPRS scale: 3/10  Pain location: cervical paraspinals/ UT musculature Pain description: achy/ tender Aggravating factors: prolonged position/ repeated movement Relieving factors: rest/ massage  PRECAUTIONS: Cervical  RED FLAGS: None    WEIGHT BEARING RESTRICTIONS: No  FALLS:  Has patient fallen in last 6 months? 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    NEXT MD VISIT: PRN  OBJECTIVE:  Note: Objective measures were completed at Evaluation unless otherwise noted.  DIAGNOSTIC  FINDINGS:  See MD notes  PATIENT SURVEYS:  NDI: 46% self-perceived moderate disability.    COGNITION: Overall cognitive status: Within functional limits for tasks assessed  SENSATION: WFL  POSTURE: rounded shoulders and forward head  PALPATION: (+) tenderness   CERVICAL ROM:   Active ROM A/PROM (deg) eval  Flexion 51 deg.  Extension 28 deg.  Right lateral flexion 60 deg.  Left lateral flexion 58 deg.  Right rotation 37 deg.  Left rotation 33 deg. (Pain)   (Blank rows = not tested)  UPPER EXTREMITY ROM:  Active ROM Right eval Left eval  Shoulder flexion [facility]  Shoulder extension    Shoulder abduction [facility]  Shoulder adduction    Shoulder extension    Shoulder internal rotation [facility]  Shoulder external rotation [facility]  Elbow flexion    Elbow extension    Wrist flexion    Wrist extension    Wrist ulnar deviation    Wrist radial deviation    Wrist pronation    Wrist supination     (Blank rows = not tested)  UPPER EXTREMITY MMT:  MMT Right eval Left eval  Shoulder flexion 4- 4-  Shoulder extension    Shoulder abduction 4- 4-  Shoulder adduction    Shoulder extension    Shoulder internal rotation 4 4  Shoulder external rotation 4 4  Middle trapezius    Lower trapezius    Elbow flexion 4+ 4+  Elbow extension 5 5  Wrist flexion    Wrist extension    Wrist ulnar deviation    Wrist radial deviation    Wrist pronation    Wrist supination    Grip strength     (Blank rows = not tested)  CERVICAL SPECIAL TESTS:  NT  FUNCTIONAL TESTS:  NT  TREATMENT DATE: 12/23/23  Subjective:  Pt. Entered PT with in B UE/ upper cervical paraspinals and presents with forward shoulder posture.  Pt. Has 1 more PT appt. Before trip to [place] .    Manual tx.:  Seated/ prone STM to B UT (R>L)/ mid-thoracic paraspinals.    Supine STM/  stretches on bilateral cervical paraspinals, levator scap, upper trap.    Supine cervical traction/ suboccipital release technique in supine 5x with holds (20 sec.).    Supine L/R cervical rotn. Stretches with holds (pain tolerable range).     Supine L/R shoulder flexion with OP.  Reassessment of cervical AROM (all planes) in seated posture.  B shoulder AROM (all planes).  Reassessment of UE radicular symptoms.    Prone TrP release technique to L/R UT region.  No Hypervolt to B UT/ upper thoracic region today.    Application of Biofreeze to neck/ upper back in prone position after tx. Session.     No change to HEP  PATIENT EDUCATION:  Education details: Discussed POC/ posture Person educated: Patient Education method: Medical Illustrator Education comprehension: verbalized understanding and returned demonstration  HOME EXERCISE PROGRAM: Reviewed HEP  ASSESSMENT:  CLINICAL IMPRESSION: Pt. Benefits from manual tx. To increase/ maintain cervical ROM in order to complete pain tolerable daily/ work-related tasks.  Pt. Continues to present with trigger point in upper cervical paraspinal that continues to increase pain with palpation/ end range cervical AROM (esp. Rotn/ lateral flexion). Pt. Will benefit from skilled PT services 1x/week to manage cervical limitations to improve pain-free mobility with daily/ work-related tasks.   OBJECTIVE IMPAIRMENTS: decreased mobility, decreased ROM, decreased strength, hypomobility, impaired flexibility, impaired UE functional use, improper body mechanics, postural dysfunction, and pain.   ACTIVITY LIMITATIONS: carrying, lifting, reach over head, hygiene/grooming, and caring for others  PARTICIPATION LIMITATIONS: meal prep, cleaning, laundry, driving, shopping, community activity, and occupation  PERSONAL FACTORS: Fitness and Past/current experiences are also affecting patient's functional outcome.   REHAB POTENTIAL: Good  CLINICAL  DECISION MAKING: Evolving/moderate complexity  EVALUATION COMPLEXITY: Moderate   GOALS: Goals reviewed with patient? Yes  SHORT TERM GOALS: Target date: 12/10/23  Pt. Independent with HEP to increase cervical AROM 25% to improve pain-free mobility.   Baseline:  see above Goal status: Partially met  2.  Pt. Will demonstrate proper seated upright posture to decrease neck pain with work-related tasks.  Baseline:  forward head, rounded sh. Posture.  Goal status: Not met  LONG TERM GOALS: Target date: 01/07/24  Pt. Will decrease NDI to <30% to improve pain-free mobility with daily tasks.  Baseline: 46% Goal status: On-going  2.  Pt. Will report no UE radicular symptoms/ tingling in hand with daily household/ work-related tasks to improve pain-free mobility.   Baseline:  R hand tingling (change in symptoms with activity). Goal status: Partially met  3.  Pt. Will report minimal B UT trigger point tenderness to improve pain-free mobility.   Baseline: (+) tenderness Goal status: Not met  PLAN:  PT FREQUENCY: 1-2x/week  PT DURATION: 8 weeks  PLANNED INTERVENTIONS: 97750- Physical Performance Testing, 97110-Therapeutic exercises, 97530- Therapeutic activity, [id number]- Neuromuscular re-education, 97535- Self Care, 02859- Manual therapy, G0283- Electrical stimulation (unattended), [id number]- Electrical stimulation (manual), 20560 (1-2 muscles), 20561 (3+ muscles)- Dry Needling, Patient/Family education, Joint mobilization, Spinal mobilization, Cryotherapy, and Moist heat  PLAN FOR NEXT SESSION: COMPLETE NDI.  1 more PT tx. Sessions before trip to [place] .  Ozell JAYSON Sero, PT, DPT # [id number] 12/26/2023, 3:39 PM

## 2023-12-26 NOTE — Therapy (Signed)
 OUTPATIENT PHYSICAL THERAPY CERVICAL TREATMENT  Patient Name: Amanda Humphrey MRN: [id number] DOB:[birth year], 58 y.o., female Today's Date: 12/17/2023  PCP: Marylynn Verneita CROME, MD REFERRING PROVIDER: Marylynn Verneita CROME, MD   PT End of Session - 12/26/23 1315     Visit Number 13    Number of Visits 17    Date for Recertification  01/07/24    PT Start Time 0947    PT Stop Time 1031    PT Time Calculation (min) 44 min    Activity Tolerance Patient tolerated treatment well    Behavior During Therapy [facility] for tasks assessed/performed         Past Medical History:  Diagnosis Date   Allergy    Arthritis    Chronic kidney disease    stones   Fibrocystic breast disease 2013   GERD (gastroesophageal reflux disease)    Hyperlipidemia    Hypertension    Meniere disease    Migraines    Neuromuscular disorder (HCC) 2019   Past Surgical History:  Procedure Laterality Date   ABDOMINAL HYSTERECTOMY  2011   laprascopic supracervical, [place]   APPENDECTOMY  1999   BREAST BIOPSY Right 11/2010    fibrocystic disease, [place]   CESAREAN SECTION     4 total   CHOLECYSTECTOMY  2011   SPINE SURGERY  2021   TONSILLECTOMY AND ADENOIDECTOMY  1975   TUBAL LIGATION     Patient Active Problem List   Diagnosis Date Noted   Acute respiratory infection 11/03/2023   Acute cough 11/03/2023   Sinus pressure 11/03/2023   Statin myopathy 05/20/2023   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   H/O of hemilaminectomy 02/15/2019   Cervical spondylitis with radiculitis 11/17/2018   Fatigue 09/23/2017   History of pneumonia 04/30/2016   Grief reaction 04/30/2016   Vitamin D  deficiency 02/22/2015   Hyperlipidemia 01/01/2015   Visit for preventive health examination 12/04/2013   Lower extremity edema 09/02/2013   Essential hypertension 09/02/2013   History of cardiomyopathy 09/02/2013   SVT (supraventricular tachycardia) 09/02/2013   Contrast media allergy 03/10/2013   Fibrocystic breast disease     Morbid obesity (HCC) 02/05/2012   Meniere disease     PCP: Marylynn Verneita CROME, MD  REFERRING PROVIDER: Marylynn Verneita CROME, MD  REFERRING DIAG: [phone] (ICD-10-CM) - Chronic neck pain   THERAPY DIAG:  Neck pain  Joint stiffness of spine  Muscle weakness (generalized)  Rationale for Evaluation and Treatment: Rehabilitation  ONSET DATE: chronic  SUBJECTIVE:  SUBJECTIVE STATEMENT: Pt. Well known to PT clinic for chronic neck pain (see previous PT notes).  Pt. Reports 3-4/10 neck pain (L and R C3-4 region).  Pt. Describes pain as sharp in R c-spine (6/10) and c/o 9/10 pain at worst with residual HA.  Pt. States HA have been daily in occipital region (burning, stabbing).   Pt. C/o persistent tension in neck and has not participated with PT in several months due to work/ family/ schedule.  Pt. Has tingling sensation in R hand and c/o muscle contraction in L hand. Hand dominance: Right  PERTINENT HISTORY:  See MD/ PT notes.    PAIN:  Are you having pain? Yes: NPRS scale: 3/10  Pain location: cervical paraspinals/ UT musculature Pain description: achy/ tender Aggravating factors: prolonged position/ repeated movement Relieving factors: rest/ massage  PRECAUTIONS: Cervical  RED FLAGS: None    WEIGHT BEARING RESTRICTIONS: No  FALLS:  Has patient fallen in last 6 months? 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    NEXT MD VISIT: PRN  OBJECTIVE:  Note: Objective measures were completed at Evaluation unless otherwise noted.  DIAGNOSTIC FINDINGS:  See MD  notes  PATIENT SURVEYS:  NDI: 46% self-perceived moderate disability.    COGNITION: Overall cognitive status: Within functional limits for tasks assessed  SENSATION: WFL  POSTURE: rounded shoulders and forward head  PALPATION: (+) tenderness   CERVICAL ROM:   Active ROM A/PROM (deg) eval  Flexion 51 deg.  Extension 28 deg.  Right lateral flexion 60 deg.  Left lateral flexion 58 deg.  Right rotation 37 deg.  Left rotation 33 deg. (Pain)   (Blank rows = not tested)  UPPER EXTREMITY ROM:  Active ROM Right eval Left eval  Shoulder flexion [facility]  Shoulder extension    Shoulder abduction [facility]  Shoulder adduction    Shoulder extension    Shoulder internal rotation [facility]  Shoulder external rotation [facility]  Elbow flexion    Elbow extension    Wrist flexion    Wrist extension    Wrist ulnar deviation    Wrist radial deviation    Wrist pronation    Wrist supination     (Blank rows = not tested)  UPPER EXTREMITY MMT:  MMT Right eval Left eval  Shoulder flexion 4- 4-  Shoulder extension    Shoulder abduction 4- 4-  Shoulder adduction    Shoulder extension    Shoulder internal rotation 4 4  Shoulder external rotation 4 4  Middle trapezius    Lower trapezius    Elbow flexion 4+ 4+  Elbow extension 5 5  Wrist flexion    Wrist extension    Wrist ulnar deviation    Wrist radial deviation    Wrist pronation    Wrist supination    Grip strength     (Blank rows = not tested)  CERVICAL SPECIAL TESTS:  NT  FUNCTIONAL TESTS:  NT  TREATMENT DATE: 12/17/23  Subjective:  Pt. Entered PT with discomfort at R occiput/ cervical paraspinals and presents with forward shoulder posture.  Pt. Has 2 more PT appts. Before trip to [place] .    Manual tx.:  Seated/ prone STM to B UT (R>L)/ mid-thoracic paraspinals.    Supine STM/  stretches on bilateral cervical paraspinals, levator scap, upper trap.    Supine cervical traction/ suboccipital release technique in supine 5x with holds (20 sec.).    Supine L/R cervical rotn. Stretches with holds (pain tolerable range).     Supine L/R shoulder flexion with OP.  Reassessment of cervical AROM (all planes) in seated posture.  B shoulder AROM (all planes).  Reassessment of UE radicular symptoms.    Prone TrP release technique to L/R UT region.  No Hypervolt to B UT/ upper thoracic region today.    Application of Biofreeze to neck/ upper back in prone position after tx. Session.     No change to HEP  PATIENT EDUCATION:  Education details: Discussed POC/ posture Person educated: Patient Education method: Medical Illustrator Education comprehension: verbalized understanding and returned demonstration  HOME EXERCISE PROGRAM: Reviewed HEP  ASSESSMENT:  CLINICAL IMPRESSION: Pt. Demonstrates an improvement in upright posture during cervical/ UE ROM reassessment of edge of mat table.  Pt. Benefits from manual tx. To increase/ maintain cervical ROM in order to complete pain tolerable daily/ work-related tasks.  Pt. Has a trigger point in R upper cervical paraspinal that continues to increase pain with palpation/ tripper pt. Release technique. Pt. Will benefit from skilled PT services 1x/week to manage cervical limitations to improve pain-free mobility with daily/ work-related tasks.   OBJECTIVE IMPAIRMENTS: decreased mobility, decreased ROM, decreased strength, hypomobility, impaired flexibility, impaired UE functional use, improper body mechanics, postural dysfunction, and pain.   ACTIVITY LIMITATIONS: carrying, lifting, reach over head, hygiene/grooming, and caring for others  PARTICIPATION LIMITATIONS: meal prep, cleaning, laundry, driving, shopping, community activity, and occupation  PERSONAL FACTORS: Fitness and Past/current experiences are also affecting  patient's functional outcome.   REHAB POTENTIAL: Good  CLINICAL DECISION MAKING: Evolving/moderate complexity  EVALUATION COMPLEXITY: Moderate   GOALS: Goals reviewed with patient? Yes  SHORT TERM GOALS: Target date: 12/10/23  Pt. Independent with HEP to increase cervical AROM 25% to improve pain-free mobility.   Baseline:  see above Goal status: Partially met  2.  Pt. Will demonstrate proper seated upright posture to decrease neck pain with work-related tasks.  Baseline:  forward head, rounded sh. Posture.  Goal status: Not met  LONG TERM GOALS: Target date: 01/07/24  Pt. Will decrease NDI to <30% to improve pain-free mobility with daily tasks.  Baseline: 46% Goal status: On-going  2.  Pt. Will report no UE radicular symptoms/ tingling in hand with daily household/ work-related tasks to improve pain-free mobility.   Baseline:  R hand tingling (change in symptoms with activity). Goal status: Partially met  3.  Pt. Will report minimal B UT trigger point tenderness to improve pain-free mobility.   Baseline: (+) tenderness Goal status: Not met  PLAN:  PT FREQUENCY: 1-2x/week  PT DURATION: 8 weeks  PLANNED INTERVENTIONS: 97750- Physical Performance Testing, 97110-Therapeutic exercises, 97530- Therapeutic activity, [id number]- Neuromuscular re-education, 97535- Self Care, 02859- Manual therapy, G0283- Electrical stimulation (unattended), [id number]- Electrical stimulation (manual), 20560 (1-2 muscles), 20561 (3+ muscles)- Dry Needling, Patient/Family education, Joint mobilization, Spinal mobilization, Cryotherapy, and Moist heat  PLAN FOR NEXT SESSION: COMPLETE NDI.  2 more PT tx. Sessions before trip to [place] .  Ozell JAYSON Sero, PT, DPT #  [id number] 12/26/2023, 1:16 PM

## 2023-12-28 ENCOUNTER — Encounter: Payer: Self-pay | Admitting: Physical Therapy

## 2023-12-28 ENCOUNTER — Ambulatory Visit: Attending: Internal Medicine | Admitting: Physical Therapy

## 2023-12-28 DIAGNOSIS — M2560 Stiffness of unspecified joint, not elsewhere classified: Secondary | ICD-10-CM | POA: Insufficient documentation

## 2023-12-28 DIAGNOSIS — M542 Cervicalgia: Secondary | ICD-10-CM | POA: Diagnosis not present

## 2023-12-28 DIAGNOSIS — M79601 Pain in right arm: Secondary | ICD-10-CM | POA: Insufficient documentation

## 2023-12-28 DIAGNOSIS — M6281 Muscle weakness (generalized): Secondary | ICD-10-CM | POA: Insufficient documentation

## 2023-12-28 NOTE — Therapy (Signed)
 OUTPATIENT PHYSICAL THERAPY CERVICAL TREATMENT  Patient Name: CATALEYA CRISTINA MRN: [id number] DOB:January 09, 1966, 58 y.o., female Today's Date: 12/28/2023  PCP: Marylynn Verneita CROME, MD REFERRING PROVIDER: Marylynn Verneita CROME, MD   PT End of Session - 12/28/23 0935     Visit Number 15    Number of Visits 17    Date for Recertification  01/07/24    PT Start Time 0730    PT Stop Time 0816    PT Time Calculation (min) 46 min    Activity Tolerance Patient tolerated treatment well    Behavior During Therapy [facility] for tasks assessed/performed         Past Medical History:  Diagnosis Date   Allergy    Arthritis    Chronic kidney disease    stones   Fibrocystic breast disease 2013   GERD (gastroesophageal reflux disease)    Hyperlipidemia    Hypertension    Meniere disease    Migraines    Neuromuscular disorder (HCC) 2019   Past Surgical History:  Procedure Laterality Date   ABDOMINAL HYSTERECTOMY  2011   laprascopic supracervical, [place]   APPENDECTOMY  1999   BREAST BIOPSY Right 11/2010    fibrocystic disease, [place]   CESAREAN SECTION     4 total   CHOLECYSTECTOMY  2011   SPINE SURGERY  2021   TONSILLECTOMY AND ADENOIDECTOMY  1975   TUBAL LIGATION     Patient Active Problem List   Diagnosis Date Noted   Acute respiratory infection 11/03/2023   Acute cough 11/03/2023   Sinus pressure 11/03/2023   Statin myopathy 05/20/2023   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   H/O of hemilaminectomy 02/15/2019   Cervical spondylitis with radiculitis 11/17/2018   Fatigue 09/23/2017   History of pneumonia 04/30/2016   Grief reaction 04/30/2016   Vitamin D  deficiency 02/22/2015   Hyperlipidemia 01/01/2015   Visit for preventive health examination 12/04/2013   Lower extremity edema 09/02/2013   Essential hypertension 09/02/2013   History of cardiomyopathy 09/02/2013   SVT (supraventricular tachycardia) 09/02/2013   Contrast media allergy 03/10/2013   Fibrocystic breast disease     Morbid obesity (HCC) 02/05/2012   Meniere disease    PCP: Marylynn Verneita CROME, MD  REFERRING PROVIDER: Marylynn Verneita CROME, MD  REFERRING DIAG: [phone] (ICD-10-CM) - Chronic neck pain   THERAPY DIAG:  Neck pain  Joint stiffness of spine  Muscle weakness (generalized)  Pain in right arm  Rationale for Evaluation and Treatment: Rehabilitation  ONSET DATE: chronic  SUBJECTIVE:  SUBJECTIVE STATEMENT: Pt. Well known to PT clinic for chronic neck pain (see previous PT notes).  Pt. Reports 3-4/10 neck pain (L and R C3-4 region).  Pt. Describes pain as sharp in R c-spine (6/10) and c/o 9/10 pain at worst with residual HA.  Pt. States HA have been daily in occipital region (burning, stabbing).   Pt. C/o persistent tension in neck and has not participated with PT in several months due to work/ family/ schedule.  Pt. Has tingling sensation in R hand and c/o muscle contraction in L hand. Hand dominance: Right  PERTINENT HISTORY:  See MD/ PT notes.    PAIN:  Are you having pain? Yes: NPRS scale: 3/10  Pain location: cervical paraspinals/ UT musculature Pain description: achy/ tender Aggravating factors: prolonged position/ repeated movement Relieving factors: rest/ massage  PRECAUTIONS: Cervical  RED FLAGS: None    WEIGHT BEARING RESTRICTIONS: No  FALLS:  Has patient fallen in last 6 months? 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    NEXT MD VISIT: PRN  OBJECTIVE:  Note: Objective measures were completed at Evaluation unless otherwise noted.  DIAGNOSTIC  FINDINGS:  See MD notes  PATIENT SURVEYS:  NDI: 46% self-perceived moderate disability.    COGNITION: Overall cognitive status: Within functional limits for tasks assessed  SENSATION: WFL  POSTURE: rounded shoulders and forward head  PALPATION: (+) tenderness   CERVICAL ROM:   Active ROM A/PROM (deg) eval  Flexion 51 deg.  Extension 28 deg.  Right lateral flexion 60 deg.  Left lateral flexion 58 deg.  Right rotation 37 deg.  Left rotation 33 deg. (Pain)   (Blank rows = not tested)  UPPER EXTREMITY ROM:  Active ROM Right eval Left eval  Shoulder flexion [facility]  Shoulder extension    Shoulder abduction [facility]  Shoulder adduction    Shoulder extension    Shoulder internal rotation [facility]  Shoulder external rotation [facility]  Elbow flexion    Elbow extension    Wrist flexion    Wrist extension    Wrist ulnar deviation    Wrist radial deviation    Wrist pronation    Wrist supination     (Blank rows = not tested)  UPPER EXTREMITY MMT:  MMT Right eval Left eval  Shoulder flexion 4- 4-  Shoulder extension    Shoulder abduction 4- 4-  Shoulder adduction    Shoulder extension    Shoulder internal rotation 4 4  Shoulder external rotation 4 4  Middle trapezius    Lower trapezius    Elbow flexion 4+ 4+  Elbow extension 5 5  Wrist flexion    Wrist extension    Wrist ulnar deviation    Wrist radial deviation    Wrist pronation    Wrist supination    Grip strength     (Blank rows = not tested)  CERVICAL SPECIAL TESTS:  NT  FUNCTIONAL TESTS:  NT  TREATMENT DATE: 12/28/2023  Subjective:  Pt. Reports no new complaints.  Pt. Scheduled to fly to [place]  tomorrow for a work/ leisure trip.    NDI: 40% self-perceived moderate disability (slight improvement).    There.ex.:  Supine B shoulder wand ex.: flexion/ sh. Abduction and  adduction 10x.    Reassessment of cervical/ shoulder AROM.  Manual tx.:  Supine L/R cervical rotn. stretches with holds (pain tolerable range).   Supine STM/ stretches on bilateral cervical paraspinals, levator scap, upper trap.    Supine cervical traction/ suboccipital release technique in supine 4x with holds.  Supine R ULTT: (+) radial and ulnar nerve glides.  Explained self upper limb neural glides for ulnar nerve root.   Prone TrP release technique to L/R UT region.  No Hypervolt to B UT/ upper thoracic region today.    Application of Biofreeze to neck/ upper back in prone position after tx. Session.     No change to HEP  PATIENT EDUCATION:  Education details: Discussed POC/ posture Person educated: Patient Education method: Medical Illustrator Education comprehension: verbalized understanding and returned demonstration  HOME EXERCISE PROGRAM: Reviewed HEP  ASSESSMENT:  CLINICAL IMPRESSION: Pt. Benefits from manual tx. To increase/ maintain cervical ROM in order to complete pain tolerable daily/ work-related tasks.  Pt. Has shown improvement in NDI over past month.    (+) R radial/ ulnar nerve ULTT and modified ulnar nerve glides to increase length/ pain mgmt. Pt. Will benefit from skilled PT services 1x/week to manage cervical limitations to improve pain-free mobility with daily/ work-related tasks.   OBJECTIVE IMPAIRMENTS: decreased mobility, decreased ROM, decreased strength, hypomobility, impaired flexibility, impaired UE functional use, improper body mechanics, postural dysfunction, and pain.   ACTIVITY LIMITATIONS: carrying, lifting, reach over head, hygiene/grooming, and caring for others  PARTICIPATION LIMITATIONS: meal prep, cleaning, laundry, driving, shopping, community activity, and occupation  PERSONAL FACTORS: Fitness and Past/current experiences are also affecting patient's functional outcome.   REHAB POTENTIAL: Good  CLINICAL DECISION MAKING:  Evolving/moderate complexity  EVALUATION COMPLEXITY: Moderate   GOALS: Goals reviewed with patient? Yes  SHORT TERM GOALS: Target date: 12/10/23  Pt. Independent with HEP to increase cervical AROM 25% to improve pain-free mobility.   Baseline:  see above Goal status: Partially met  2.  Pt. Will demonstrate proper seated upright posture to decrease neck pain with work-related tasks.  Baseline:  forward head, rounded sh. Posture.  Goal status: Not met  LONG TERM GOALS: Target date: 01/07/24  Pt. Will decrease NDI to <30% to improve pain-free mobility with daily tasks.  Baseline: 46%.  12/1: 40% Goal status: Not met  2.  Pt. Will report no UE radicular symptoms/ tingling in hand with daily household/ work-related tasks to improve pain-free mobility.   Baseline:  R hand tingling (change in symptoms with activity). Goal status: Partially met  3.  Pt. Will report minimal B UT trigger point tenderness to improve pain-free mobility.   Baseline: (+) tenderness Goal status: Not met  PLAN:  PT FREQUENCY: 1-2x/week  PT DURATION: 8 weeks  PLANNED INTERVENTIONS: 97750- Physical Performance Testing, 97110-Therapeutic exercises, 97530- Therapeutic activity, [id number]- Neuromuscular re-education, 97535- Self Care, 02859- Manual therapy, G0283- Electrical stimulation (unattended), [id number]- Electrical stimulation (manual), 20560 (1-2 muscles), 20561 (3+ muscles)- Dry Needling, Patient/Family education, Joint mobilization, Spinal mobilization, Cryotherapy, and Moist heat  PLAN FOR NEXT SESSION: Discuss trip/ POC and goals.    Ozell JAYSON Sero, PT, DPT # [phone] 12/28/2023, 9:40 AM

## 2024-01-07 ENCOUNTER — Ambulatory Visit: Admitting: Physical Therapy

## 2024-01-14 ENCOUNTER — Ambulatory Visit: Admitting: Physical Therapy

## 2024-01-19 ENCOUNTER — Ambulatory Visit: Admitting: Physical Therapy

## 2024-01-19 DIAGNOSIS — M542 Cervicalgia: Secondary | ICD-10-CM | POA: Diagnosis not present

## 2024-01-19 DIAGNOSIS — M6281 Muscle weakness (generalized): Secondary | ICD-10-CM | POA: Diagnosis not present

## 2024-01-19 DIAGNOSIS — M2560 Stiffness of unspecified joint, not elsewhere classified: Secondary | ICD-10-CM

## 2024-01-19 DIAGNOSIS — M79601 Pain in right arm: Secondary | ICD-10-CM | POA: Diagnosis not present

## 2024-01-23 NOTE — Therapy (Signed)
 " OUTPATIENT PHYSICAL THERAPY CERVICAL TREATMENT/ RECERTIFICATION  Patient Name: Amanda Humphrey MRN: [id number] DOB:1965-08-23, 58 y.o., female Today's Date: 01/19/24  PCP: Marylynn Verneita CROME, MD REFERRING PROVIDER: Marylynn Verneita CROME, MD   PT End of Session - 01/23/24 0604     Visit Number 16    Number of Visits 24    Date for Recertification  03/15/24    PT Start Time 1117    PT Stop Time 1200    PT Time Calculation (min) 43 min    Activity Tolerance Patient tolerated treatment well    Behavior During Therapy [facility] for tasks assessed/performed         Past Medical History:  Diagnosis Date   Allergy    Arthritis    Chronic kidney disease    stones   Fibrocystic breast disease 2013   GERD (gastroesophageal reflux disease)    Hyperlipidemia    Hypertension    Meniere disease    Migraines    Neuromuscular disorder (HCC) 2019   Past Surgical History:  Procedure Laterality Date   ABDOMINAL HYSTERECTOMY  2011   laprascopic supracervical, [place]   APPENDECTOMY  1999   BREAST BIOPSY Right 11/2010    fibrocystic disease, [place]   CESAREAN SECTION     4 total   CHOLECYSTECTOMY  2011   SPINE SURGERY  2021   TONSILLECTOMY AND ADENOIDECTOMY  1975   TUBAL LIGATION     Patient Active Problem List   Diagnosis Date Noted   Acute respiratory infection 11/03/2023   Acute cough 11/03/2023   Sinus pressure 11/03/2023   Statin myopathy 05/20/2023   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   H/O of hemilaminectomy 02/15/2019   Cervical spondylitis with radiculitis 11/17/2018   Fatigue 09/23/2017   History of pneumonia 04/30/2016   Grief reaction 04/30/2016   Vitamin D  deficiency 02/22/2015   Hyperlipidemia 01/01/2015   Visit for preventive health examination 12/04/2013   Lower extremity edema 09/02/2013   Essential hypertension 09/02/2013   History of cardiomyopathy 09/02/2013   SVT (supraventricular tachycardia) 09/02/2013   Contrast media allergy 03/10/2013   Fibrocystic breast  disease    Morbid obesity (HCC) 02/05/2012   Meniere disease    PCP: Marylynn Verneita CROME, MD  REFERRING PROVIDER: Marylynn Verneita CROME, MD  REFERRING DIAG: [phone] (ICD-10-CM) - Chronic neck pain   THERAPY DIAG:  Neck pain  Joint stiffness of spine  Muscle weakness (generalized)  Pain in right arm  Rationale for Evaluation and Treatment: Rehabilitation  ONSET DATE: chronic  SUBJECTIVE:  SUBJECTIVE STATEMENT: Pt. Well known to PT clinic for chronic neck pain (see previous PT notes).  Pt. Reports 3-4/10 neck pain (L and R C3-4 region).  Pt. Describes pain as sharp in R c-spine (6/10) and c/o 9/10 pain at worst with residual HA.  Pt. States HA have been daily in occipital region (burning, stabbing).   Pt. C/o persistent tension in neck and has not participated with PT in several months due to work/ family/ schedule.  Pt. Has tingling sensation in R hand and c/o muscle contraction in L hand. Hand dominance: Right  PERTINENT HISTORY:  See MD/ PT notes.    PAIN:  Are you having pain? Yes: NPRS scale: 3/10  Pain location: cervical paraspinals/ UT musculature Pain description: achy/ tender Aggravating factors: prolonged position/ repeated movement Relieving factors: rest/ massage  PRECAUTIONS: Cervical  RED FLAGS: None    WEIGHT BEARING RESTRICTIONS: No  FALLS:  Has patient fallen in last 6 months? 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    NEXT MD VISIT: PRN  OBJECTIVE:  Note: Objective measures were completed at Evaluation unless otherwise  noted.  DIAGNOSTIC FINDINGS:  See MD notes  PATIENT SURVEYS:  NDI: 46% self-perceived moderate disability.    COGNITION: Overall cognitive status: Within functional limits for tasks assessed  SENSATION: WFL  POSTURE: rounded shoulders and forward head  PALPATION: (+) tenderness   CERVICAL ROM:   Active ROM A/PROM (deg) eval  Flexion 51 deg.  Extension 28 deg.  Right lateral flexion 60 deg.  Left lateral flexion 58 deg.  Right rotation 37 deg.  Left rotation 33 deg. (Pain)   (Blank rows = not tested)  UPPER EXTREMITY ROM:  Active ROM Right eval Left eval  Shoulder flexion [facility]  Shoulder extension    Shoulder abduction [facility]  Shoulder adduction    Shoulder extension    Shoulder internal rotation [facility] WFL  Shoulder external rotation [facility]  Elbow flexion    Elbow extension    Wrist flexion    Wrist extension    Wrist ulnar deviation    Wrist radial deviation    Wrist pronation    Wrist supination     (Blank rows = not tested)  UPPER EXTREMITY MMT:  MMT Right eval Left eval  Shoulder flexion 4- 4-  Shoulder extension    Shoulder abduction 4- 4-  Shoulder adduction    Shoulder extension    Shoulder internal rotation 4 4  Shoulder external rotation 4 4  Middle trapezius    Lower trapezius    Elbow flexion 4+ 4+  Elbow extension 5 5  Wrist flexion    Wrist extension    Wrist ulnar deviation    Wrist radial deviation    Wrist pronation    Wrist supination    Grip strength     (Blank rows = not tested)  CERVICAL SPECIAL TESTS:  NT  FUNCTIONAL TESTS:  NT  NDI: 40% self-perceived moderate disability (slight improvement).   TREATMENT DATE: 01/19/24  Subjective:  Pt. Reports no new complaints.  Pt. Had a nice trip to [facility] with no worsening cervical symptoms.   No UE radicular symptoms reported prior to tx. Session.     There.ex.:  No  charge  Standing/ supine B shoulder B shoulder and cervical AROM reassessment.  OP applied to sh. AROM at end-range flexion/ abduction.    Manual tx.:  Supine L/R cervical rotn. stretches with holds (pain tolerable range).   Supine STM/ stretches on bilateral cervical paraspinals, levator scap, upper trap.    Supine cervical traction/ suboccipital release technique in supine 4x with holds.  Prone TrP release technique to L/R UT region.  No Hypervolt to B UT/ upper thoracic region today.    Application of Biofreeze to neck/ upper back in seated position after tx. Session.    Seated thoracic/lumbar rotn with OP to L/R as tolerated.  Reassessment of cervical AROM after tx.    No change to HEP  PATIENT EDUCATION:  Education details: Discussed POC/ posture Person educated: Patient Education method: Medical Illustrator Education comprehension: verbalized understanding and returned demonstration  HOME EXERCISE PROGRAM: Reviewed HEP  ASSESSMENT:  CLINICAL IMPRESSION: Pt. Benefits from manual tx. To increase/ maintain cervical ROM in order to complete pain tolerable daily/ work-related tasks.  Pt. Has shown slow but consistent progress with cervical/ upper thoracic muscle tightness and pain.  Pt. Still has fluctuations in ULTT/ numbness and tingling symptoms.  Pt. Will benefit from skilled PT services 1x/week to manage cervical limitations to improve pain-free mobility with daily/ work-related tasks.   OBJECTIVE IMPAIRMENTS: decreased mobility, decreased ROM, decreased strength, hypomobility, impaired flexibility, impaired UE functional use, improper body mechanics, postural dysfunction, and pain.   ACTIVITY LIMITATIONS: carrying, lifting, reach over head, hygiene/grooming, and caring for others  PARTICIPATION LIMITATIONS: meal prep, cleaning, laundry, driving, shopping, community activity, and occupation  PERSONAL FACTORS: Fitness and Past/current  experiences are also affecting patient's functional outcome.   REHAB POTENTIAL: Good  CLINICAL DECISION MAKING: Evolving/moderate complexity  EVALUATION COMPLEXITY: Moderate   GOALS: Goals reviewed with patient? Yes  LONG TERM GOALS: Target date: 03/15/24  Pt. Will decrease NDI to <30% to improve pain-free mobility with daily tasks.  Baseline: 46%.  12/1: 40% Goal status: Not met  2.  Pt. Will report no UE radicular symptoms/ tingling in hand with daily household/ work-related tasks to improve pain-free mobility.   Baseline:  R hand tingling (change in symptoms with activity). Goal status: Partially met  3.  Pt. Will report minimal B UT trigger point tenderness to improve pain-free mobility.   Baseline: (+) tenderness Goal status: Not met  4.  Pt. Will demonstrate proper seated upright posture to decrease neck pain with work-related tasks.  Baseline:  forward head, rounded sh. Posture.  Goal status: Not met  PLAN:  PT FREQUENCY: 1-2x/week  PT DURATION: 8 weeks  PLANNED INTERVENTIONS: 97750- Physical Performance Testing, 97110-Therapeutic exercises, 97530- Therapeutic activity, [id number]- Neuromuscular re-education, 97535- Self Care, 02859- Manual therapy, G0283- Electrical stimulation (unattended), [id number]- Electrical stimulation (manual), 20560 (1-2 muscles), 20561 (3+ muscles)- Dry Needling, Patient/Family education, Joint mobilization, Spinal mobilization, Cryotherapy, and Moist heat  PLAN FOR NEXT SESSION:  Progress strengthening ex. For HEP  Ozell JAYSON Sero, PT, DPT # [phone] 01/23/2024, 6:05 AM  "

## 2024-01-26 ENCOUNTER — Ambulatory Visit: Admitting: Physical Therapy

## 2024-01-26 DIAGNOSIS — M2560 Stiffness of unspecified joint, not elsewhere classified: Secondary | ICD-10-CM

## 2024-01-26 DIAGNOSIS — M542 Cervicalgia: Secondary | ICD-10-CM

## 2024-01-26 DIAGNOSIS — M6281 Muscle weakness (generalized): Secondary | ICD-10-CM

## 2024-01-26 DIAGNOSIS — M79601 Pain in right arm: Secondary | ICD-10-CM

## 2024-01-27 ENCOUNTER — Encounter: Payer: Self-pay | Admitting: Physical Therapy

## 2024-01-27 NOTE — Therapy (Signed)
 " OUTPATIENT PHYSICAL THERAPY CERVICAL TREATMENT  Patient Name: Amanda Humphrey MRN: [id number] DOB:[birth year], 58 y.o., female Today's Date: 01/27/2024  PCP: Marylynn Verneita CROME, MD REFERRING PROVIDER: Marylynn Verneita CROME, MD   PT End of Session - 01/27/24 1832     Visit Number 17    Number of Visits 24    Date for Recertification  03/15/24    PT Start Time 0901    PT Stop Time 0947    PT Time Calculation (min) 46 min    Activity Tolerance Patient tolerated treatment well    Behavior During Therapy [facility] for tasks assessed/performed         Past Medical History:  Diagnosis Date   Allergy    Arthritis    Chronic kidney disease    stones   Fibrocystic breast disease 2013   GERD (gastroesophageal reflux disease)    Hyperlipidemia    Hypertension    Meniere disease    Migraines    Neuromuscular disorder (HCC) 2019   Past Surgical History:  Procedure Laterality Date   ABDOMINAL HYSTERECTOMY  2011   laprascopic supracervical, [place]   APPENDECTOMY  1999   BREAST BIOPSY Right 11/2010    fibrocystic disease, [place]   CESAREAN SECTION     4 total   CHOLECYSTECTOMY  2011   SPINE SURGERY  2021   TONSILLECTOMY AND ADENOIDECTOMY  1975   TUBAL LIGATION     Patient Active Problem List   Diagnosis Date Noted   Acute respiratory infection 11/03/2023   Acute cough 11/03/2023   Sinus pressure 11/03/2023   Statin myopathy 05/20/2023   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   H/O of hemilaminectomy 02/15/2019   Cervical spondylitis with radiculitis 11/17/2018   Fatigue 09/23/2017   History of pneumonia 04/30/2016   Grief reaction 04/30/2016   Vitamin D  deficiency 02/22/2015   Hyperlipidemia 01/01/2015   Visit for preventive health examination 12/04/2013   Lower extremity edema 09/02/2013   Essential hypertension 09/02/2013   History of cardiomyopathy 09/02/2013   SVT (supraventricular tachycardia) 09/02/2013   Contrast media allergy 03/10/2013   Fibrocystic breast disease     Morbid obesity (HCC) 02/05/2012   Meniere disease    PCP: Marylynn Verneita CROME, MD  REFERRING PROVIDER: Marylynn Verneita CROME, MD  REFERRING DIAG: [phone] (ICD-10-CM) - Chronic neck pain   THERAPY DIAG:  Neck pain  Joint stiffness of spine  Muscle weakness (generalized)  Pain in right arm  Rationale for Evaluation and Treatment: Rehabilitation  ONSET DATE: chronic  SUBJECTIVE:  SUBJECTIVE STATEMENT: Pt. Well known to PT clinic for chronic neck pain (see previous PT notes).  Pt. Reports 3-4/10 neck pain (L and R C3-4 region).  Pt. Describes pain as sharp in R c-spine (6/10) and c/o 9/10 pain at worst with residual HA.  Pt. States HA have been daily in occipital region (burning, stabbing).   Pt. C/o persistent tension in neck and has not participated with PT in several months due to work/ family/ schedule.  Pt. Has tingling sensation in R hand and c/o muscle contraction in L hand. Hand dominance: Right  PERTINENT HISTORY:  See MD/ PT notes.    PAIN:  Are you having pain? Yes: NPRS scale: 3/10  Pain location: cervical paraspinals/ UT musculature Pain description: achy/ tender Aggravating factors: prolonged position/ repeated movement Relieving factors: rest/ massage  PRECAUTIONS: Cervical  RED FLAGS: None    WEIGHT BEARING RESTRICTIONS: No  FALLS:  Has patient fallen in last 6 months? 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    NEXT MD VISIT: PRN  OBJECTIVE:  Note: Objective measures were completed at Evaluation unless otherwise noted.  DIAGNOSTIC  FINDINGS:  See MD notes  PATIENT SURVEYS:  NDI: 46% self-perceived moderate disability.    COGNITION: Overall cognitive status: Within functional limits for tasks assessed  SENSATION: WFL  POSTURE: rounded shoulders and forward head  PALPATION: (+) tenderness   CERVICAL ROM:   Active ROM A/PROM (deg) eval  Flexion 51 deg.  Extension 28 deg.  Right lateral flexion 60 deg.  Left lateral flexion 58 deg.  Right rotation 37 deg.  Left rotation 33 deg. (Pain)   (Blank rows = not tested)  UPPER EXTREMITY ROM:  Active ROM Right eval Left eval  Shoulder flexion [facility]  Shoulder extension    Shoulder abduction [facility]  Shoulder adduction    Shoulder extension    Shoulder internal rotation [facility] WFL  Shoulder external rotation [facility]  Elbow flexion    Elbow extension    Wrist flexion    Wrist extension    Wrist ulnar deviation    Wrist radial deviation    Wrist pronation    Wrist supination     (Blank rows = not tested)  UPPER EXTREMITY MMT:  MMT Right eval Left eval  Shoulder flexion 4- 4-  Shoulder extension    Shoulder abduction 4- 4-  Shoulder adduction    Shoulder extension    Shoulder internal rotation 4 4  Shoulder external rotation 4 4  Middle trapezius    Lower trapezius    Elbow flexion 4+ 4+  Elbow extension 5 5  Wrist flexion    Wrist extension    Wrist ulnar deviation    Wrist radial deviation    Wrist pronation    Wrist supination    Grip strength     (Blank rows = not tested)  CERVICAL SPECIAL TESTS:  NT  FUNCTIONAL TESTS:  NT  NDI: 40% self-perceived moderate disability (slight improvement).   TREATMENT DATE: 01/27/2024  Subjective:  Pt. Reports no new complaints.  No UE radicular symptoms reported prior to tx. Session.    There.ex.:  No  charge  Standing/ supine B shoulder B shoulder and cervical AROM  reassessment.  OP applied to sh. AROM at end-range flexion/ abduction.    Manual tx.:  Supine L/R cervical rotn. stretches with holds (pain tolerable range).   Supine STM/ stretches on bilateral cervical paraspinals, levator scap, upper trap.    Supine cervical traction/ suboccipital release technique in supine 4x with holds.  Prone TrP release technique to L/R UT region.  No Hypervolt to B UT/ upper thoracic region today.    Application of Biofreeze to neck/ upper back in seated position after tx. Session.    Seated thoracic/lumbar rotn with OP to L/R as tolerated.  Reassessment of cervical AROM after tx.    No change to HEP  PATIENT EDUCATION:  Education details: Discussed POC/ posture Person educated: Patient Education method: Medical Illustrator Education comprehension: verbalized understanding and returned demonstration  HOME EXERCISE PROGRAM: Reviewed HEP  ASSESSMENT:  CLINICAL IMPRESSION: Pt. Benefits from manual tx. To increase/ maintain cervical ROM in order to complete pain tolerable daily/ work-related tasks.  Pt. Has shown slow but consistent progress with cervical/ upper thoracic muscle tightness and pain.  Pt. Still has fluctuations in ULTT/ numbness and tingling symptoms.  Pt. Will benefit from skilled PT services 1x/week to manage cervical limitations to improve pain-free mobility with daily/ work-related tasks.   OBJECTIVE IMPAIRMENTS: decreased mobility, decreased ROM, decreased strength, hypomobility, impaired flexibility, impaired UE functional use, improper body mechanics, postural dysfunction, and pain.   ACTIVITY LIMITATIONS: carrying, lifting, reach over head, hygiene/grooming, and caring for others  PARTICIPATION LIMITATIONS: meal prep, cleaning, laundry, driving, shopping, community activity, and occupation  PERSONAL FACTORS: Fitness and Past/current experiences are also affecting patient's functional outcome.   REHAB POTENTIAL:  Good  CLINICAL DECISION MAKING: Evolving/moderate complexity  EVALUATION COMPLEXITY: Moderate   GOALS: Goals reviewed with patient? Yes  LONG TERM GOALS: Target date: 03/15/24  Pt. Will decrease NDI to <30% to improve pain-free mobility with daily tasks.  Baseline: 46%.  12/1: 40% Goal status: Not met  2.  Pt. Will report no UE radicular symptoms/ tingling in hand with daily household/ work-related tasks to improve pain-free mobility.   Baseline:  R hand tingling (change in symptoms with activity). Goal status: Partially met  3.  Pt. Will report minimal B UT trigger point tenderness to improve pain-free mobility.   Baseline: (+) tenderness Goal status: Not met  4.  Pt. Will demonstrate proper seated upright posture to decrease neck pain with work-related tasks.  Baseline:  forward head, rounded sh. Posture.  Goal status: Not met  PLAN:  PT FREQUENCY: 1-2x/week  PT DURATION: 8 weeks  PLANNED INTERVENTIONS: 97750- Physical Performance Testing, 97110-Therapeutic exercises, 97530- Therapeutic activity, [id number]- Neuromuscular re-education, 97535- Self Care, 02859- Manual therapy, G0283- Electrical stimulation (unattended), [id number]- Electrical stimulation (manual), 20560 (1-2 muscles), 20561 (3+ muscles)- Dry Needling, Patient/Family education, Joint mobilization, Spinal mobilization, Cryotherapy, and Moist heat  PLAN FOR NEXT SESSION:  Progress strengthening ex. For HEP  Ozell JAYSON Sero, PT, DPT # [id number] 01/27/2024, 6:33 PM  "

## 2024-02-04 ENCOUNTER — Ambulatory Visit: Attending: Internal Medicine | Admitting: Physical Therapy

## 2024-02-04 DIAGNOSIS — M6281 Muscle weakness (generalized): Secondary | ICD-10-CM | POA: Insufficient documentation

## 2024-02-04 DIAGNOSIS — M542 Cervicalgia: Secondary | ICD-10-CM | POA: Insufficient documentation

## 2024-02-04 DIAGNOSIS — M2560 Stiffness of unspecified joint, not elsewhere classified: Secondary | ICD-10-CM | POA: Insufficient documentation

## 2024-02-04 DIAGNOSIS — M79601 Pain in right arm: Secondary | ICD-10-CM | POA: Insufficient documentation

## 2024-02-06 NOTE — Therapy (Signed)
 " OUTPATIENT PHYSICAL THERAPY CERVICAL TREATMENT  Patient Name: Amanda Humphrey MRN: [id number] DOB:06/04/1965, 59 y.o., female Today's Date: 02/04/2024  PCP: Marylynn Verneita CROME, MD REFERRING PROVIDER: Marylynn Verneita CROME, MD   PT End of Session - 02/06/24 1534     Visit Number 18    Number of Visits 24    Date for Recertification  03/15/24    PT Start Time 0905    PT Stop Time 0947    PT Time Calculation (min) 42 min    Activity Tolerance Patient tolerated treatment well    Behavior During Therapy [facility] for tasks assessed/performed         Past Medical History:  Diagnosis Date   Allergy    Arthritis    Chronic kidney disease    stones   Fibrocystic breast disease 2013   GERD (gastroesophageal reflux disease)    Hyperlipidemia    Hypertension    Meniere disease    Migraines    Neuromuscular disorder (HCC) 2019   Past Surgical History:  Procedure Laterality Date   ABDOMINAL HYSTERECTOMY  2011   laprascopic supracervical, [place]   APPENDECTOMY  1999   BREAST BIOPSY Right 11/2010    fibrocystic disease, [place]   CESAREAN SECTION     4 total   CHOLECYSTECTOMY  2011   SPINE SURGERY  2021   TONSILLECTOMY AND ADENOIDECTOMY  1975   TUBAL LIGATION     Patient Active Problem List   Diagnosis Date Noted   Acute respiratory infection 11/03/2023   Acute cough 11/03/2023   Sinus pressure 11/03/2023   Statin myopathy 05/20/2023   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   H/O of hemilaminectomy 02/15/2019   Cervical spondylitis with radiculitis 11/17/2018   Fatigue 09/23/2017   History of pneumonia 04/30/2016   Grief reaction 04/30/2016   Vitamin D  deficiency 02/22/2015   Hyperlipidemia 01/01/2015   Visit for preventive health examination 12/04/2013   Lower extremity edema 09/02/2013   Essential hypertension 09/02/2013   History of cardiomyopathy 09/02/2013   SVT (supraventricular tachycardia) 09/02/2013   Contrast media allergy 03/10/2013   Fibrocystic breast disease     Morbid obesity (HCC) 02/05/2012   Meniere disease    PCP: Marylynn Verneita CROME, MD  REFERRING PROVIDER: Marylynn Verneita CROME, MD  REFERRING DIAG: [phone] (ICD-10-CM) - Chronic neck pain   THERAPY DIAG:  Neck pain  Joint stiffness of spine  Muscle weakness (generalized)  Pain in right arm  Rationale for Evaluation and Treatment: Rehabilitation  ONSET DATE: chronic  SUBJECTIVE:  SUBJECTIVE STATEMENT: Pt. Well known to PT clinic for chronic neck pain (see previous PT notes).  Pt. Reports 3-4/10 neck pain (L and R C3-4 region).  Pt. Describes pain as sharp in R c-spine (6/10) and c/o 9/10 pain at worst with residual HA.  Pt. States HA have been daily in occipital region (burning, stabbing).   Pt. C/o persistent tension in neck and has not participated with PT in several months due to work/ family/ schedule.  Pt. Has tingling sensation in R hand and c/o muscle contraction in L hand. Hand dominance: Right  PERTINENT HISTORY:  See MD/ PT notes.    PAIN:  Are you having pain? Yes: NPRS scale: 3/10  Pain location: cervical paraspinals/ UT musculature Pain description: achy/ tender Aggravating factors: prolonged position/ repeated movement Relieving factors: rest/ massage  PRECAUTIONS: Cervical  RED FLAGS: None    WEIGHT BEARING RESTRICTIONS: No  FALLS:  Has patient fallen in last 6 months? 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    NEXT MD VISIT: PRN  OBJECTIVE:  Note: Objective measures were completed at Evaluation unless otherwise noted.  DIAGNOSTIC  FINDINGS:  See MD notes  PATIENT SURVEYS:  NDI: 46% self-perceived moderate disability.    COGNITION: Overall cognitive status: Within functional limits for tasks assessed  SENSATION: WFL  POSTURE: rounded shoulders and forward head  PALPATION: (+) tenderness   CERVICAL ROM:   Active ROM A/PROM (deg) eval  Flexion 51 deg.  Extension 28 deg.  Right lateral flexion 60 deg.  Left lateral flexion 58 deg.  Right rotation 37 deg.  Left rotation 33 deg. (Pain)   (Blank rows = not tested)  UPPER EXTREMITY ROM:  Active ROM Right eval Left eval  Shoulder flexion [facility]  Shoulder extension    Shoulder abduction [facility]  Shoulder adduction    Shoulder extension    Shoulder internal rotation [facility] WFL  Shoulder external rotation [facility]  Elbow flexion    Elbow extension    Wrist flexion    Wrist extension    Wrist ulnar deviation    Wrist radial deviation    Wrist pronation    Wrist supination     (Blank rows = not tested)  UPPER EXTREMITY MMT:  MMT Right eval Left eval  Shoulder flexion 4- 4-  Shoulder extension    Shoulder abduction 4- 4-  Shoulder adduction    Shoulder extension    Shoulder internal rotation 4 4  Shoulder external rotation 4 4  Middle trapezius    Lower trapezius    Elbow flexion 4+ 4+  Elbow extension 5 5  Wrist flexion    Wrist extension    Wrist ulnar deviation    Wrist radial deviation    Wrist pronation    Wrist supination    Grip strength     (Blank rows = not tested)  CERVICAL SPECIAL TESTS:  NT  FUNCTIONAL TESTS:  NT  NDI: 40% self-perceived moderate disability (slight improvement).   TREATMENT DATE: 02/04/2024  Subjective:  Pt. Reports no new complaints.  No UE radicular symptoms reported prior to tx. Session.   Pt. Presents with forward head/ rounded shoulder posture in sitting while discussing past  weekend.  Pt. Reports she has a change in insurance/ copay in 2026.    Manual tx.:  Prone TrP release technique to L/R UT region.  Use of Hypervolt to B UT/ thoracic paraspinals prior to manual stretches.  Use of MH to varying locations on back during STM.    Supine L/R cervical rotn. stretches with holds (pain tolerable range).   Supine STM/ stretches on bilateral cervical paraspinals, levator scap, upper trap.    Supine cervical traction/ suboccipital release technique in supine 4x with holds.  Application of Biofreeze to neck/ upper back in seated position after tx. Session.    Seated thoracic/lumbar rotn with OP to L/R as tolerated.  Reassessment of cervical AROM after tx.    No change to HEP  PATIENT EDUCATION:  Education details: Discussed POC/ posture Person educated: Patient Education method: Medical Illustrator Education comprehension: verbalized understanding and returned demonstration  HOME EXERCISE PROGRAM: Reviewed HEP  ASSESSMENT:  CLINICAL IMPRESSION: Pt. Benefits from manual tx. To increase/ maintain cervical ROM in order to complete pain tolerable daily/ work-related tasks.  Pt. Has shown slow but consistent progress with cervical/ upper thoracic muscle tightness and pain.  Pt. Still has fluctuations in ULTT/ numbness and tingling symptoms.  Pt. Will benefit from skilled PT services 1x/week to manage cervical limitations to improve pain-free mobility with daily/ work-related tasks.   OBJECTIVE IMPAIRMENTS: decreased mobility, decreased ROM, decreased strength, hypomobility, impaired flexibility, impaired UE functional use, improper body mechanics, postural dysfunction, and pain.   ACTIVITY LIMITATIONS: carrying, lifting, reach over head, hygiene/grooming, and caring for others  PARTICIPATION LIMITATIONS: meal prep, cleaning, laundry, driving, shopping, community activity, and occupation  PERSONAL FACTORS: Fitness and Past/current experiences are also  affecting patient's functional outcome.   REHAB POTENTIAL: Good  CLINICAL DECISION MAKING: Evolving/moderate complexity  EVALUATION COMPLEXITY: Moderate   GOALS: Goals reviewed with patient? Yes  LONG TERM GOALS: Target date: 03/15/24  Pt. Will decrease NDI to <30% to improve pain-free mobility with daily tasks.  Baseline: 46%.  12/1: 40% Goal status: Not met  2.  Pt. Will report no UE radicular symptoms/ tingling in hand with daily household/ work-related tasks to improve pain-free mobility.   Baseline:  R hand tingling (change in symptoms with activity). Goal status: Partially met  3.  Pt. Will report minimal B UT trigger point tenderness to improve pain-free mobility.   Baseline: (+) tenderness Goal status: Not met  4.  Pt. Will demonstrate proper seated upright posture to decrease neck pain with work-related tasks.  Baseline:  forward head, rounded sh. Posture.  Goal status: Not met  PLAN:  PT FREQUENCY: 1-2x/week  PT DURATION: 8 weeks  PLANNED INTERVENTIONS: 97750- Physical Performance Testing, 97110-Therapeutic exercises, 97530- Therapeutic activity, [id number]- Neuromuscular re-education, 97535- Self Care, 02859- Manual therapy, G0283- Electrical stimulation (unattended), [id number]- Electrical stimulation (manual), 20560 (1-2 muscles), 20561 (3+ muscles)- Dry Needling, Patient/Family education, Joint mobilization, Spinal mobilization, Cryotherapy, and Moist heat  PLAN FOR NEXT SESSION:  Progress strengthening ex. For HEP  Ozell JAYSON Sero, PT, DPT # [phone] 02/06/2024, 3:35 PM  "

## 2024-02-11 ENCOUNTER — Ambulatory Visit: Admitting: Physical Therapy

## 2024-02-11 DIAGNOSIS — M79601 Pain in right arm: Secondary | ICD-10-CM

## 2024-02-11 DIAGNOSIS — M542 Cervicalgia: Secondary | ICD-10-CM | POA: Diagnosis not present

## 2024-02-11 DIAGNOSIS — M6281 Muscle weakness (generalized): Secondary | ICD-10-CM

## 2024-02-11 DIAGNOSIS — M2560 Stiffness of unspecified joint, not elsewhere classified: Secondary | ICD-10-CM

## 2024-02-18 ENCOUNTER — Ambulatory Visit: Admitting: Physical Therapy

## 2024-02-20 ENCOUNTER — Encounter: Payer: Self-pay | Admitting: Physical Therapy

## 2024-02-20 NOTE — Therapy (Signed)
 " OUTPATIENT PHYSICAL THERAPY CERVICAL TREATMENT  Patient Name: Amanda Humphrey MRN: [id number] DOB:October 17, 1965, 59 y.o., female Today's Date: 02/11/2024  PCP: Marylynn Verneita CROME, MD REFERRING PROVIDER: Marylynn Verneita CROME, MD   PT End of Session - 02/20/24 1556     Visit Number 19    Number of Visits 24    Date for Recertification  03/15/24    PT Start Time 0901    PT Stop Time 0944    PT Time Calculation (min) 43 min    Activity Tolerance Patient tolerated treatment well    Behavior During Therapy [facility] for tasks assessed/performed         Past Medical History:  Diagnosis Date   Allergy    Arthritis    Chronic kidney disease    stones   Fibrocystic breast disease 2013   GERD (gastroesophageal reflux disease)    Hyperlipidemia    Hypertension    Meniere disease    Migraines    Neuromuscular disorder (HCC) 2019   Past Surgical History:  Procedure Laterality Date   ABDOMINAL HYSTERECTOMY  2011   laprascopic supracervical, [place]   APPENDECTOMY  1999   BREAST BIOPSY Right 11/2010    fibrocystic disease, [place]   CESAREAN SECTION     4 total   CHOLECYSTECTOMY  2011   SPINE SURGERY  2021   TONSILLECTOMY AND ADENOIDECTOMY  1975   TUBAL LIGATION     Patient Active Problem List   Diagnosis Date Noted   Acute respiratory infection 11/03/2023   Acute cough 11/03/2023   Sinus pressure 11/03/2023   Statin myopathy 05/20/2023   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   H/O of hemilaminectomy 02/15/2019   Cervical spondylitis with radiculitis 11/17/2018   Fatigue 09/23/2017   History of pneumonia 04/30/2016   Grief reaction 04/30/2016   Vitamin D  deficiency 02/22/2015   Hyperlipidemia 01/01/2015   Visit for preventive health examination 12/04/2013   Lower extremity edema 09/02/2013   Essential hypertension 09/02/2013   History of cardiomyopathy 09/02/2013   SVT (supraventricular tachycardia) 09/02/2013   Contrast media allergy 03/10/2013   Fibrocystic breast disease     Morbid obesity (HCC) 02/05/2012   Meniere disease    PCP: Marylynn Verneita CROME, MD  REFERRING PROVIDER: Marylynn Verneita CROME, MD  REFERRING DIAG: [phone] (ICD-10-CM) - Chronic neck pain   THERAPY DIAG:  Neck pain  Joint stiffness of spine  Muscle weakness (generalized)  Pain in right arm  Rationale for Evaluation and Treatment: Rehabilitation  ONSET DATE: chronic  SUBJECTIVE:  SUBJECTIVE STATEMENT: Pt. Well known to PT clinic for chronic neck pain (see previous PT notes).  Pt. Reports 3-4/10 neck pain (L and R C3-4 region).  Pt. Describes pain as sharp in R c-spine (6/10) and c/o 9/10 pain at worst with residual HA.  Pt. States HA have been daily in occipital region (burning, stabbing).   Pt. C/o persistent tension in neck and has not participated with PT in several months due to work/ family/ schedule.  Pt. Has tingling sensation in R hand and c/o muscle contraction in L hand. Hand dominance: Right  PERTINENT HISTORY:  See MD/ PT notes.    PAIN:  Are you having pain? Yes: NPRS scale: 3/10  Pain location: cervical paraspinals/ UT musculature Pain description: achy/ tender Aggravating factors: prolonged position/ repeated movement Relieving factors: rest/ massage  PRECAUTIONS: Cervical  RED FLAGS: None    WEIGHT BEARING RESTRICTIONS: No  FALLS:  Has patient fallen in last 6 months? 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    NEXT MD VISIT: PRN  OBJECTIVE:  Note: Objective measures were completed at Evaluation unless otherwise noted.  DIAGNOSTIC  FINDINGS:  See MD notes  PATIENT SURVEYS:  NDI: 46% self-perceived moderate disability.    COGNITION: Overall cognitive status: Within functional limits for tasks assessed  SENSATION: WFL  POSTURE: rounded shoulders and forward head  PALPATION: (+) tenderness   CERVICAL ROM:   Active ROM A/PROM (deg) eval  Flexion 51 deg.  Extension 28 deg.  Right lateral flexion 60 deg.  Left lateral flexion 58 deg.  Right rotation 37 deg.  Left rotation 33 deg. (Pain)   (Blank rows = not tested)  UPPER EXTREMITY ROM:  Active ROM Right eval Left eval  Shoulder flexion [facility]  Shoulder extension    Shoulder abduction [facility]  Shoulder adduction    Shoulder extension    Shoulder internal rotation [facility] WFL  Shoulder external rotation [facility]  Elbow flexion    Elbow extension    Wrist flexion    Wrist extension    Wrist ulnar deviation    Wrist radial deviation    Wrist pronation    Wrist supination     (Blank rows = not tested)  UPPER EXTREMITY MMT:  MMT Right eval Left eval  Shoulder flexion 4- 4-  Shoulder extension    Shoulder abduction 4- 4-  Shoulder adduction    Shoulder extension    Shoulder internal rotation 4 4  Shoulder external rotation 4 4  Middle trapezius    Lower trapezius    Elbow flexion 4+ 4+  Elbow extension 5 5  Wrist flexion    Wrist extension    Wrist ulnar deviation    Wrist radial deviation    Wrist pronation    Wrist supination    Grip strength     (Blank rows = not tested)  CERVICAL SPECIAL TESTS:  NT  FUNCTIONAL TESTS:  NT  NDI: 40% self-perceived moderate disability (slight improvement).   TREATMENT DATE: 02/11/2024  Subjective:  Pt. Has been active at work and managing all household tasks.  Pt. States husband is less active and requires more assist at home.  Pt. Tense in upper back/ neck musculature  prior to tx. Session.    Manual tx.:  Prone TrP release technique to L/R UT region.  Use of Hypervolt to B UT/ thoracic paraspinals prior to manual stretches.  Use of MH to varying locations on back during STM.    Supine L/R cervical rotn. stretches with holds (pain tolerable range).   Supine STM/ stretches on bilateral cervical paraspinals, levator scap, upper trap.    Supine cervical traction/ suboccipital release technique in supine 4x with holds.  Application of Biofreeze to neck/ upper back in seated position after tx. Session.    Seated thoracic/lumbar rotn with OP to L/R as tolerated.  Reassessment of cervical AROM after tx.    No change to HEP  PATIENT EDUCATION:  Education details: Discussed POC/ posture Person educated: Patient Education method: Medical Illustrator Education comprehension: verbalized understanding and returned demonstration  HOME EXERCISE PROGRAM: Reviewed HEP  ASSESSMENT:  CLINICAL IMPRESSION: Pt. Benefits from manual tx. To increase/ maintain cervical ROM in order to complete pain tolerable daily/ work-related tasks.  Pt. Has shown slow but consistent progress with cervical/ upper thoracic muscle tightness and pain.  Pt. Still has fluctuations in ULTT/ numbness and tingling symptoms.  Pt. Will benefit from skilled PT services 1x/week to manage cervical limitations to improve pain-free mobility with daily/ work-related tasks.   OBJECTIVE IMPAIRMENTS: decreased mobility, decreased ROM, decreased strength, hypomobility, impaired flexibility, impaired UE functional use, improper body mechanics, postural dysfunction, and pain.   ACTIVITY LIMITATIONS: carrying, lifting, reach over head, hygiene/grooming, and caring for others  PARTICIPATION LIMITATIONS: meal prep, cleaning, laundry, driving, shopping, community activity, and occupation  PERSONAL FACTORS: Fitness and Past/current experiences are also affecting patient's functional outcome.    REHAB POTENTIAL: Good  CLINICAL DECISION MAKING: Evolving/moderate complexity  EVALUATION COMPLEXITY: Moderate   GOALS: Goals reviewed with patient? Yes  LONG TERM GOALS: Target date: 03/15/24  Pt. Will decrease NDI to <30% to improve pain-free mobility with daily tasks.  Baseline: 46%.  12/1: 40% Goal status: Not met  2.  Pt. Will report no UE radicular symptoms/ tingling in hand with daily household/ work-related tasks to improve pain-free mobility.   Baseline:  R hand tingling (change in symptoms with activity). Goal status: Partially met  3.  Pt. Will report minimal B UT trigger point tenderness to improve pain-free mobility.   Baseline: (+) tenderness Goal status: Not met  4.  Pt. Will demonstrate proper seated upright posture to decrease neck pain with work-related tasks.  Baseline:  forward head, rounded sh. Posture.  Goal status: Not met  PLAN:  PT FREQUENCY: 1-2x/week  PT DURATION: 8 weeks  PLANNED INTERVENTIONS: 97750- Physical Performance Testing, 97110-Therapeutic exercises, 97530- Therapeutic activity, [id number]- Neuromuscular re-education, 97535- Self Care, 02859- Manual therapy, G0283- Electrical stimulation (unattended), [id number]- Electrical stimulation (manual), 20560 (1-2 muscles), 20561 (3+ muscles)- Dry Needling, Patient/Family education, Joint mobilization, Spinal mobilization, Cryotherapy, and Moist heat  PLAN FOR NEXT SESSION:  Progress strengthening ex. For HEP.  10th visit progress note next tx. Session.   Ozell JAYSON Sero, PT, DPT # [phone] 02/20/2024, 3:57 PM  "

## 2024-02-22 ENCOUNTER — Other Ambulatory Visit: Payer: Self-pay | Admitting: Cardiovascular Disease

## 2024-02-25 ENCOUNTER — Ambulatory Visit: Admitting: Physical Therapy

## 2024-02-25 DIAGNOSIS — M79601 Pain in right arm: Secondary | ICD-10-CM

## 2024-02-25 DIAGNOSIS — M6281 Muscle weakness (generalized): Secondary | ICD-10-CM

## 2024-02-25 DIAGNOSIS — M2560 Stiffness of unspecified joint, not elsewhere classified: Secondary | ICD-10-CM

## 2024-02-25 DIAGNOSIS — M542 Cervicalgia: Secondary | ICD-10-CM

## 2024-02-27 NOTE — Therapy (Signed)
 " OUTPATIENT PHYSICAL THERAPY CERVICAL TREATMENT Physical Therapy Progress Note  Dates of reporting period  11/24/23   to   02/25/24   Patient Name: Amanda Humphrey MRN: [id number] DOB:02-28-1965, 59 y.o., female Today's Date: 02/25/2024  PCP: Marylynn Verneita CROME, MD REFERRING PROVIDER: Marylynn Verneita CROME, MD   PT End of Session - 02/27/24 1458     Visit Number 20    Number of Visits 24    Date for Recertification  03/15/24    PT Start Time 0857    PT Stop Time 0944    PT Time Calculation (min) 47 min    Activity Tolerance Patient tolerated treatment well    Behavior During Therapy [facility] for tasks assessed/performed         Past Medical History:  Diagnosis Date   Allergy    Arthritis    Chronic kidney disease    stones   Fibrocystic breast disease 2013   GERD (gastroesophageal reflux disease)    Hyperlipidemia    Hypertension    Meniere disease    Migraines    Neuromuscular disorder (HCC) 2019   Past Surgical History:  Procedure Laterality Date   ABDOMINAL HYSTERECTOMY  2011   laprascopic supracervical, [place]   APPENDECTOMY  1999   BREAST BIOPSY Right 11/2010    fibrocystic disease, [place]   CESAREAN SECTION     4 total   CHOLECYSTECTOMY  2011   SPINE SURGERY  2021   TONSILLECTOMY AND ADENOIDECTOMY  1975   TUBAL LIGATION     Patient Active Problem List   Diagnosis Date Noted   Acute respiratory infection 11/03/2023   Acute cough 11/03/2023   Sinus pressure 11/03/2023   Statin myopathy 05/20/2023   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   H/O of hemilaminectomy 02/15/2019   Cervical spondylitis with radiculitis 11/17/2018   Fatigue 09/23/2017   History of pneumonia 04/30/2016   Grief reaction 04/30/2016   Vitamin D  deficiency 02/22/2015   Hyperlipidemia 01/01/2015   Visit for preventive health examination 12/04/2013   Lower extremity edema 09/02/2013   Essential hypertension 09/02/2013   History of cardiomyopathy 09/02/2013   SVT (supraventricular  tachycardia) 09/02/2013   Contrast media allergy 03/10/2013   Fibrocystic breast disease    Morbid obesity (HCC) 02/05/2012   Meniere disease    PCP: Marylynn Verneita CROME, MD  REFERRING PROVIDER: Marylynn Verneita CROME, MD  REFERRING DIAG: [phone] (ICD-10-CM) - Chronic neck pain   THERAPY DIAG:  Neck pain  Joint stiffness of spine  Muscle weakness (generalized)  Pain in right arm  Rationale for Evaluation and Treatment: Rehabilitation  ONSET DATE: chronic  SUBJECTIVE:  SUBJECTIVE STATEMENT: Pt. Well known to PT clinic for chronic neck pain (see previous PT notes).  Pt. Reports 3-4/10 neck pain (L and R C3-4 region).  Pt. Describes pain as sharp in R c-spine (6/10) and c/o 9/10 pain at worst with residual HA.  Pt. States HA have been daily in occipital region (burning, stabbing).   Pt. C/o persistent tension in neck and has not participated with PT in several months due to work/ family/ schedule.  Pt. Has tingling sensation in R hand and c/o muscle contraction in L hand. Hand dominance: Right  PERTINENT HISTORY:  See MD/ PT notes.    PAIN:  Are you having pain? Yes: NPRS scale: 3/10  Pain location: cervical paraspinals/ UT musculature Pain description: achy/ tender Aggravating factors: prolonged position/ repeated movement Relieving factors: rest/ massage  PRECAUTIONS: Cervical  RED FLAGS: None    WEIGHT BEARING RESTRICTIONS: No  FALLS:  Has patient fallen in last 6 months? 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    NEXT MD VISIT: PRN  OBJECTIVE:   Note: Objective measures were completed at Evaluation unless otherwise noted.  DIAGNOSTIC FINDINGS:  See MD notes  PATIENT SURVEYS:  NDI: 46% self-perceived moderate disability.    COGNITION: Overall cognitive status: Within functional limits for tasks assessed  SENSATION: WFL  POSTURE: rounded shoulders and forward head  PALPATION: (+) tenderness   CERVICAL ROM:   Active ROM A/PROM (deg) eval  Flexion 51 deg.  Extension 28 deg.  Right lateral flexion 60 deg.  Left lateral flexion 58 deg.  Right rotation 37 deg.  Left rotation 33 deg. (Pain)   (Blank rows = not tested)  UPPER EXTREMITY ROM:  Active ROM Right eval Left eval  Shoulder flexion [facility]  Shoulder extension    Shoulder abduction [facility]  Shoulder adduction    Shoulder extension    Shoulder internal rotation [facility] WFL  Shoulder external rotation [facility]  Elbow flexion    Elbow extension    Wrist flexion    Wrist extension    Wrist ulnar deviation    Wrist radial deviation    Wrist pronation    Wrist supination     (Blank rows = not tested)  UPPER EXTREMITY MMT:  MMT Right eval Left eval  Shoulder flexion 4- 4-  Shoulder extension    Shoulder abduction 4- 4-  Shoulder adduction    Shoulder extension    Shoulder internal rotation 4 4  Shoulder external rotation 4 4  Middle trapezius    Lower trapezius    Elbow flexion 4+ 4+  Elbow extension 5 5  Wrist flexion    Wrist extension    Wrist ulnar deviation    Wrist radial deviation    Wrist pronation    Wrist supination    Grip strength     (Blank rows = not tested)  CERVICAL SPECIAL TESTS:  NT  FUNCTIONAL TESTS:  NT  NDI: 40% self-perceived moderate disability (slight improvement).   TREATMENT DATE: 02/25/2024  Subjective:  Pt. Reports discomfort in R upper cervical paraspinals/ trap.  Pt. Tense in upper  back/ neck musculature prior to tx. Session and presents with rounded shoulder/ forward head posture.    Manual tx.:  Seated cervical AROM (all planes): palpable tenderness over L upper cervical spine.  Soft tissue rolling noted.    Supine L/R cervical rotn. stretches with holds (pain tolerable range).   Supine STM/ stretches on bilateral cervical paraspinals, levator scap, upper trap.   Supine B shoulder flexion/ horizontal abduction 10x each with focus on pec stretch.  Supine cervical traction/ suboccipital release technique in supine 4x with holds.  Prone TrP release technique to L/R UT region.  Use of Hypervolt to B UT/ thoracic paraspinals prior to manual stretches.  Use of MH to varying locations on back during STM.    Application of Biofreeze to neck/ upper back in seated position after tx. Session.    Seated thoracic/lumbar rotn with OP to L/R as tolerated.  Reassessment of cervical AROM after tx.     PATIENT EDUCATION:  Education details: Discussed POC/ posture Person educated: Patient Education method: Medical Illustrator Education comprehension: verbalized understanding and returned demonstration  HOME EXERCISE PROGRAM: Reviewed HEP  ASSESSMENT:  CLINICAL IMPRESSION: Pt. Benefits from manual tx. To increase/ maintain cervical ROM in order to complete pain tolerable daily/ work-related tasks.  Pt. Has shown slow but consistent progress with cervical/ upper thoracic muscle tightness and pain.  Pt. Still has fluctuations in ULTT/ numbness and tingling symptoms.  See updated PT goals.  Pt. Will benefit from skilled PT services 1x/week to manage cervical limitations to improve pain-free mobility with daily/ work-related tasks.   OBJECTIVE IMPAIRMENTS: decreased mobility, decreased ROM, decreased strength, hypomobility, impaired flexibility, impaired UE functional use, improper body mechanics, postural dysfunction, and pain.   ACTIVITY LIMITATIONS: carrying, lifting,  reach over head, hygiene/grooming, and caring for others  PARTICIPATION LIMITATIONS: meal prep, cleaning, laundry, driving, shopping, community activity, and occupation  PERSONAL FACTORS: Fitness and Past/current experiences are also affecting patient's functional outcome.   REHAB POTENTIAL: Good  CLINICAL DECISION MAKING: Evolving/moderate complexity  EVALUATION COMPLEXITY: Moderate   GOALS: Goals reviewed with patient? Yes  LONG TERM GOALS: Target date: 03/15/24  Pt. Will decrease NDI to <30% to improve pain-free mobility with daily tasks.  Baseline: 46%.  12/1: 40% Goal status: Not met  2.  Pt. Will report no UE radicular symptoms/ tingling in hand with daily household/ work-related tasks to improve pain-free mobility.   Baseline:  R hand tingling (change in symptoms with activity). Goal status: Partially met  3.  Pt. Will report minimal B UT trigger point tenderness to improve pain-free mobility.   Baseline: (+) tenderness Goal status: Not met  4.  Pt. Will demonstrate proper seated upright posture to decrease neck pain with work-related tasks.  Baseline:  forward head, rounded sh. Posture.  Goal status: Not met  PLAN:  PT FREQUENCY: 1-2x/week  PT DURATION: 8 weeks  PLANNED INTERVENTIONS: 97750- Physical Performance Testing, 97110-Therapeutic exercises, 97530- Therapeutic activity, [id number]- Neuromuscular re-education, 97535- Self Care, 02859- Manual therapy, G0283- Electrical stimulation (unattended), [id number]- Electrical stimulation (manual), 20560 (1-2 muscles), 20561 (3+ muscles)- Dry Needling, Patient/Family education, Joint mobilization, Spinal mobilization, Cryotherapy, and Moist heat  PLAN FOR NEXT SESSION:  Progress strengthening ex. For HEP.    Ozell JAYSON Sero, PT, DPT # [phone] 02/27/2024, 2:59 PM  "

## 2024-03-03 ENCOUNTER — Ambulatory Visit: Admitting: Physical Therapy

## 2024-03-04 ENCOUNTER — Ambulatory Visit: Admitting: Physical Therapy

## 2024-03-04 ENCOUNTER — Encounter: Payer: Self-pay | Admitting: Physical Therapy

## 2024-03-04 DIAGNOSIS — M6281 Muscle weakness (generalized): Secondary | ICD-10-CM

## 2024-03-04 DIAGNOSIS — M2560 Stiffness of unspecified joint, not elsewhere classified: Secondary | ICD-10-CM

## 2024-03-04 DIAGNOSIS — M79601 Pain in right arm: Secondary | ICD-10-CM

## 2024-03-04 DIAGNOSIS — M542 Cervicalgia: Secondary | ICD-10-CM

## 2024-03-04 NOTE — Therapy (Signed)
 " OUTPATIENT PHYSICAL THERAPY CERVICAL TREATMENT  Patient Name: Amanda Humphrey MRN: [id number] DOB:1965/02/11, 59 y.o., female Today's Date: 03/04/2024  PCP: Marylynn Verneita CROME, MD REFERRING PROVIDER: Marylynn Verneita CROME, MD   PT End of Session - 03/04/24 [phone]     Visit Number 21    Number of Visits 24    Date for Recertification  03/15/24    PT Start Time 0913    PT Stop Time 0946    PT Time Calculation (min) 33 min    Activity Tolerance Patient tolerated treatment well    Behavior During Therapy [facility] for tasks assessed/performed         Past Medical History:  Diagnosis Date   Allergy    Arthritis    Chronic kidney disease    stones   Fibrocystic breast disease 2013   GERD (gastroesophageal reflux disease)    Hyperlipidemia    Hypertension    Meniere disease    Migraines    Neuromuscular disorder (HCC) 2019   Past Surgical History:  Procedure Laterality Date   ABDOMINAL HYSTERECTOMY  2011   laprascopic supracervical, [place]   APPENDECTOMY  1999   BREAST BIOPSY Right 11/2010    fibrocystic disease, [place]   CESAREAN SECTION     4 total   CHOLECYSTECTOMY  2011   SPINE SURGERY  2021   TONSILLECTOMY AND ADENOIDECTOMY  1975   TUBAL LIGATION     Patient Active Problem List   Diagnosis Date Noted   Acute respiratory infection 11/03/2023   Acute cough 11/03/2023   Sinus pressure 11/03/2023   Statin myopathy 05/20/2023   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   H/O of hemilaminectomy 02/15/2019   Cervical spondylitis with radiculitis 11/17/2018   Fatigue 09/23/2017   History of pneumonia 04/30/2016   Grief reaction 04/30/2016   Vitamin D  deficiency 02/22/2015   Hyperlipidemia 01/01/2015   Visit for preventive health examination 12/04/2013   Lower extremity edema 09/02/2013   Essential hypertension 09/02/2013   History of cardiomyopathy 09/02/2013   SVT (supraventricular tachycardia) 09/02/2013   Contrast media allergy 03/10/2013   Fibrocystic breast disease     Morbid obesity (HCC) 02/05/2012   Meniere disease    PCP: Marylynn Verneita CROME, MD  REFERRING PROVIDER: Marylynn Verneita CROME, MD  REFERRING DIAG: [phone] (ICD-10-CM) - Chronic neck pain   THERAPY DIAG:  Neck pain  Joint stiffness of spine  Muscle weakness (generalized)  Pain in right arm  Rationale for Evaluation and Treatment: Rehabilitation  ONSET DATE: chronic  SUBJECTIVE:  SUBJECTIVE STATEMENT: Pt. Well known to PT clinic for chronic neck pain (see previous PT notes).  Pt. Reports 3-4/10 neck pain (L and R C3-4 region).  Pt. Describes pain as sharp in R c-spine (6/10) and c/o 9/10 pain at worst with residual HA.  Pt. States HA have been daily in occipital region (burning, stabbing).   Pt. C/o persistent tension in neck and has not participated with PT in several months due to work/ family/ schedule.  Pt. Has tingling sensation in R hand and c/o muscle contraction in L hand. Hand dominance: Right  PERTINENT HISTORY:  See MD/ PT notes.    PAIN:  Are you having pain? Yes: NPRS scale: 3/10  Pain location: cervical paraspinals/ UT musculature Pain description: achy/ tender Aggravating factors: prolonged position/ repeated movement Relieving factors: rest/ massage  PRECAUTIONS: Cervical  RED FLAGS: None    WEIGHT BEARING RESTRICTIONS: No  FALLS:  Has patient fallen in last 6 months? 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    NEXT MD VISIT: PRN  OBJECTIVE:  Note: Objective measures were completed at Evaluation unless otherwise noted.  DIAGNOSTIC  FINDINGS:  See MD notes  PATIENT SURVEYS:  NDI: 46% self-perceived moderate disability.    COGNITION: Overall cognitive status: Within functional limits for tasks assessed  SENSATION: WFL  POSTURE: rounded shoulders and forward head  PALPATION: (+) tenderness   CERVICAL ROM:   Active ROM A/PROM (deg) eval  Flexion 51 deg.  Extension 28 deg.  Right lateral flexion 60 deg.  Left lateral flexion 58 deg.  Right rotation 37 deg.  Left rotation 33 deg. (Pain)   (Blank rows = not tested)  UPPER EXTREMITY ROM:  Active ROM Right eval Left eval  Shoulder flexion [facility]  Shoulder extension    Shoulder abduction [facility]  Shoulder adduction    Shoulder extension    Shoulder internal rotation [facility] WFL  Shoulder external rotation [facility]  Elbow flexion    Elbow extension    Wrist flexion    Wrist extension    Wrist ulnar deviation    Wrist radial deviation    Wrist pronation    Wrist supination     (Blank rows = not tested)  UPPER EXTREMITY MMT:  MMT Right eval Left eval  Shoulder flexion 4- 4-  Shoulder extension    Shoulder abduction 4- 4-  Shoulder adduction    Shoulder extension    Shoulder internal rotation 4 4  Shoulder external rotation 4 4  Middle trapezius    Lower trapezius    Elbow flexion 4+ 4+  Elbow extension 5 5  Wrist flexion    Wrist extension    Wrist ulnar deviation    Wrist radial deviation    Wrist pronation    Wrist supination    Grip strength     (Blank rows = not tested)  CERVICAL SPECIAL TESTS:  NT  FUNCTIONAL TESTS:  NT  NDI: 40% self-perceived moderate disability (slight improvement).   TREATMENT DATE: 03/04/2024  Subjective:  Pt. Reports to PT with no new complaints.  Pt. reports continued discomfort in R upper cervical paraspinals/ trap.   Pt. Has an area of soft tissue rolling in R upper/mid-cervical  area with palpation.  Pt. Tense in upper back/ neck musculature prior to tx. Session and presents with rounded shoulder/ forward head posture.    Manual tx.:  Reassessment of cervical AROM (all planes)/ seated thoracic rotn. L/R.  Palpable tenderness over B upper cervical spine/ R UT region.    Prone TrP release technique to L/R UT region.  No Hypervolt today.  Use of MH to varying locations on back during STM.    Supine L/R cervical rotn. stretches with holds (pain tolerable range).   Supine STM/ stretches on bilateral cervical paraspinals, levator scap, upper trap.   Supine B shoulder flexion/ horizontal abduction 10x each with focus on pec stretch.  Supine cervical traction/ suboccipital release technique in supine 5x with holds.  Application of Biofreeze to neck/ upper back in seated position after tx. Session.     PATIENT EDUCATION:  Education details: Discussed POC/ posture Person educated: Patient Education method: Medical Illustrator Education comprehension: verbalized understanding and returned demonstration  HOME EXERCISE PROGRAM: Reviewed HEP  ASSESSMENT:  CLINICAL IMPRESSION: Pt. Benefits from manual tx. To increase/ maintain cervical ROM in order to complete pain tolerable daily/ work-related tasks.  Pt. Has shown slow but consistent progress with cervical/ upper thoracic muscle tightness and pain.  Pt. Still has fluctuations in ULTT/ numbness and tingling symptoms.  No change to HEP at this time.  Pt. Will benefit from skilled PT services 1x/week to manage cervical limitations to improve pain-free mobility with daily/ work-related tasks.   OBJECTIVE IMPAIRMENTS: decreased mobility, decreased ROM, decreased strength, hypomobility, impaired flexibility, impaired UE functional use, improper body mechanics, postural dysfunction, and pain.   ACTIVITY LIMITATIONS: carrying, lifting, reach over head, hygiene/grooming, and caring for others  PARTICIPATION  LIMITATIONS: meal prep, cleaning, laundry, driving, shopping, community activity, and occupation  PERSONAL FACTORS: Fitness and Past/current experiences are also affecting patient's functional outcome.   REHAB POTENTIAL: Good  CLINICAL DECISION MAKING: Evolving/moderate complexity  EVALUATION COMPLEXITY: Moderate   GOALS: Goals reviewed with patient? Yes  LONG TERM GOALS: Target date: 03/15/24  Pt. Will decrease NDI to <30% to improve pain-free mobility with daily tasks.  Baseline: 46%.  12/1: 40% Goal status: Not met  2.  Pt. Will report no UE radicular symptoms/ tingling in hand with daily household/ work-related tasks to improve pain-free mobility.   Baseline:  R hand tingling (change in symptoms with activity). Goal status: Partially met  3.  Pt. Will report minimal B UT trigger point tenderness to improve pain-free mobility.   Baseline: (+) tenderness Goal status: Not met  4.  Pt. Will demonstrate proper seated upright posture to decrease neck pain with work-related tasks.  Baseline:  forward head, rounded sh. Posture.  Goal status: Not met  PLAN:  PT FREQUENCY: 1-2x/week  PT DURATION: 8 weeks  PLANNED INTERVENTIONS: 97750- Physical Performance Testing, 97110-Therapeutic exercises, 97530- Therapeutic activity, [id number]- Neuromuscular re-education, 97535- Self Care, 02859- Manual therapy, G0283- Electrical stimulation (unattended), [id number]- Electrical stimulation (manual), 20560 (1-2 muscles), 20561 (3+ muscles)- Dry Needling, Patient/Family education, Joint mobilization, Spinal mobilization, Cryotherapy, and Moist heat  PLAN FOR NEXT SESSION:  Progress strengthening ex. For HEP.    Ozell JAYSON Sero, PT, DPT # [phone] 03/04/2024, 2:14 PM  "

## 2024-03-10 ENCOUNTER — Ambulatory Visit: Admitting: Physical Therapy

## 2024-03-17 ENCOUNTER — Ambulatory Visit: Admitting: Physical Therapy

## 2024-03-24 ENCOUNTER — Ambulatory Visit: Admitting: Physical Therapy

## 2024-05-23 ENCOUNTER — Encounter: Admitting: Internal Medicine
